# Patient Record
Sex: Male | Born: 1965
Health system: Southern US, Community
[De-identification: ages and names within clinical notes are randomized; demographics above are authoritative.]

## PROBLEM LIST (undated history)

## (undated) ENCOUNTER — Emergency Department (HOSPITAL_COMMUNITY): Disposition: A | Discharge status: Home / Self Care | Payer: Medicaid Other

## (undated) DIAGNOSIS — I509 Heart failure, unspecified: Secondary | ICD-10-CM

## (undated) DIAGNOSIS — Z95810 Presence of automatic (implantable) cardiac defibrillator: Secondary | ICD-10-CM

## (undated) DIAGNOSIS — F329 Major depressive disorder, single episode, unspecified: Secondary | ICD-10-CM

## (undated) DIAGNOSIS — I428 Other cardiomyopathies: Secondary | ICD-10-CM

## (undated) DIAGNOSIS — R000 Tachycardia, unspecified: Secondary | ICD-10-CM

## (undated) DIAGNOSIS — J189 Pneumonia, unspecified organism: Secondary | ICD-10-CM

## (undated) DIAGNOSIS — J449 Chronic obstructive pulmonary disease, unspecified: Secondary | ICD-10-CM

## (undated) DIAGNOSIS — R0600 Dyspnea, unspecified: Secondary | ICD-10-CM

## (undated) DIAGNOSIS — F32A Depression, unspecified: Secondary | ICD-10-CM

## (undated) DIAGNOSIS — K219 Gastro-esophageal reflux disease without esophagitis: Secondary | ICD-10-CM

## (undated) HISTORY — DX: Gastro-esophageal reflux disease without esophagitis: K21.9

## (undated) HISTORY — DX: Tachycardia, unspecified: R00.0

---

## 2011-07-27 DIAGNOSIS — I10 Essential (primary) hypertension: Secondary | ICD-10-CM | POA: Insufficient documentation

## 2011-07-27 DIAGNOSIS — Z720 Tobacco use: Secondary | ICD-10-CM | POA: Insufficient documentation

## 2011-08-27 DIAGNOSIS — Z95810 Presence of automatic (implantable) cardiac defibrillator: Secondary | ICD-10-CM | POA: Insufficient documentation

## 2011-11-22 DIAGNOSIS — I472 Ventricular tachycardia: Secondary | ICD-10-CM | POA: Insufficient documentation

## 2014-02-22 DIAGNOSIS — I5020 Unspecified systolic (congestive) heart failure: Secondary | ICD-10-CM | POA: Insufficient documentation

## 2014-02-22 DIAGNOSIS — I428 Other cardiomyopathies: Secondary | ICD-10-CM

## 2014-02-22 DIAGNOSIS — I472 Ventricular tachycardia, unspecified: Secondary | ICD-10-CM | POA: Insufficient documentation

## 2014-02-22 DIAGNOSIS — I340 Nonrheumatic mitral (valve) insufficiency: Secondary | ICD-10-CM | POA: Insufficient documentation

## 2014-02-22 DIAGNOSIS — R0789 Other chest pain: Secondary | ICD-10-CM | POA: Insufficient documentation

## 2014-02-22 DIAGNOSIS — K219 Gastro-esophageal reflux disease without esophagitis: Secondary | ICD-10-CM | POA: Insufficient documentation

## 2014-02-22 DIAGNOSIS — R0609 Other forms of dyspnea: Secondary | ICD-10-CM | POA: Insufficient documentation

## 2014-02-23 DIAGNOSIS — I2510 Atherosclerotic heart disease of native coronary artery without angina pectoris: Secondary | ICD-10-CM | POA: Insufficient documentation

## 2014-12-17 DIAGNOSIS — F17200 Nicotine dependence, unspecified, uncomplicated: Secondary | ICD-10-CM | POA: Insufficient documentation

## 2015-04-17 HISTORY — PX: ICD GENERATOR REMOVAL: EP1232

## 2017-06-13 ENCOUNTER — Emergency Department (HOSPITAL_COMMUNITY): Payer: Medicaid Other

## 2017-06-13 ENCOUNTER — Inpatient Hospital Stay (HOSPITAL_COMMUNITY)
Admission: EM | Admit: 2017-06-13 | Discharge: 2017-06-17 | DRG: 291 | Disposition: A | Discharge status: Home / Self Care | Payer: Medicaid Other | Attending: Internal Medicine | Admitting: Internal Medicine

## 2017-06-13 ENCOUNTER — Other Ambulatory Visit: Payer: Self-pay

## 2017-06-13 ENCOUNTER — Encounter (HOSPITAL_COMMUNITY): Payer: Self-pay | Admitting: Emergency Medicine

## 2017-06-13 DIAGNOSIS — I5022 Chronic systolic (congestive) heart failure: Secondary | ICD-10-CM

## 2017-06-13 DIAGNOSIS — I447 Left bundle-branch block, unspecified: Secondary | ICD-10-CM | POA: Diagnosis present

## 2017-06-13 DIAGNOSIS — J440 Chronic obstructive pulmonary disease with acute lower respiratory infection: Secondary | ICD-10-CM | POA: Diagnosis present

## 2017-06-13 DIAGNOSIS — R0609 Other forms of dyspnea: Secondary | ICD-10-CM

## 2017-06-13 DIAGNOSIS — I5043 Acute on chronic combined systolic (congestive) and diastolic (congestive) heart failure: Secondary | ICD-10-CM

## 2017-06-13 DIAGNOSIS — I110 Hypertensive heart disease with heart failure: Principal | ICD-10-CM | POA: Diagnosis present

## 2017-06-13 DIAGNOSIS — Z9114 Patient's other noncompliance with medication regimen: Secondary | ICD-10-CM

## 2017-06-13 DIAGNOSIS — F17200 Nicotine dependence, unspecified, uncomplicated: Secondary | ICD-10-CM | POA: Diagnosis present

## 2017-06-13 DIAGNOSIS — I428 Other cardiomyopathies: Secondary | ICD-10-CM | POA: Diagnosis present

## 2017-06-13 DIAGNOSIS — Z79899 Other long term (current) drug therapy: Secondary | ICD-10-CM

## 2017-06-13 DIAGNOSIS — Z7982 Long term (current) use of aspirin: Secondary | ICD-10-CM

## 2017-06-13 DIAGNOSIS — I5023 Acute on chronic systolic (congestive) heart failure: Secondary | ICD-10-CM | POA: Diagnosis present

## 2017-06-13 DIAGNOSIS — J189 Pneumonia, unspecified organism: Secondary | ICD-10-CM | POA: Diagnosis present

## 2017-06-13 DIAGNOSIS — E876 Hypokalemia: Secondary | ICD-10-CM | POA: Diagnosis not present

## 2017-06-13 HISTORY — DX: Other cardiomyopathies: I42.8

## 2017-06-13 HISTORY — DX: Heart failure, unspecified: I50.9

## 2017-06-13 HISTORY — DX: Chronic obstructive pulmonary disease, unspecified: J44.9

## 2017-06-13 LAB — BASIC METABOLIC PANEL
Anion gap: 12 (ref 5–15)
BUN: 12 mg/dL (ref 6–20)
CO2: 21 mmol/L — AB (ref 22–32)
Calcium: 9.1 mg/dL (ref 8.9–10.3)
Chloride: 105 mmol/L (ref 101–111)
Creatinine, Ser: 0.99 mg/dL (ref 0.61–1.24)
GFR calc non Af Amer: >60 mL/min (ref >60)
Glucose, Bld: 98 mg/dL (ref 65–99)
Potassium: 4.2 mmol/L (ref 3.5–5.1)
Sodium: 138 mmol/L (ref 135–145)

## 2017-06-13 LAB — I-STAT TROPONIN, ED: Troponin i, poc: 0.01 ng/mL (ref 0.00–0.08)

## 2017-06-13 LAB — BRAIN NATRIURETIC PEPTIDE: B Natriuretic Peptide: 1699.9 pg/mL — ABNORMAL HIGH (ref 0.0–100.0)

## 2017-06-13 LAB — CBC
HCT: 39.8 % (ref 39.0–52.0)
HEMOGLOBIN: 14.3 g/dL (ref 13.0–17.0)
MCH: 34.4 pg — AB (ref 26.0–34.0)
MCHC: 35.9 g/dL (ref 30.0–36.0)
MCV: 95.7 fL (ref 78.0–100.0)
Platelets: 314 10*3/uL (ref 150–400)
RBC: 4.16 MIL/uL — AB (ref 4.22–5.81)
RDW: 13.7 % (ref 11.5–15.5)
WBC: 9 10*3/uL (ref 4.0–10.5)

## 2017-06-13 MED ORDER — FUROSEMIDE 10 MG/ML IJ SOLN
40.0000 mg | Freq: Once | INTRAMUSCULAR | Status: AC
  Administered 2017-06-13: 40 mg via INTRAVENOUS
  Filled 2017-06-13: qty 4

## 2017-06-13 NOTE — ED Triage Notes (Signed)
Pt c/o increased shortness of breath, abdominal pain and diarrhea. States he has been taking lasix, with no relief. Shortness of breath worse when lying flat.

## 2017-06-14 ENCOUNTER — Encounter (HOSPITAL_COMMUNITY): Payer: Self-pay | Admitting: Family Medicine

## 2017-06-14 ENCOUNTER — Inpatient Hospital Stay (HOSPITAL_COMMUNITY): Payer: Medicaid Other

## 2017-06-14 DIAGNOSIS — I110 Hypertensive heart disease with heart failure: Secondary | ICD-10-CM | POA: Diagnosis present

## 2017-06-14 DIAGNOSIS — I447 Left bundle-branch block, unspecified: Secondary | ICD-10-CM | POA: Diagnosis present

## 2017-06-14 DIAGNOSIS — J189 Pneumonia, unspecified organism: Secondary | ICD-10-CM | POA: Diagnosis present

## 2017-06-14 DIAGNOSIS — F17200 Nicotine dependence, unspecified, uncomplicated: Secondary | ICD-10-CM | POA: Diagnosis present

## 2017-06-14 DIAGNOSIS — I5023 Acute on chronic systolic (congestive) heart failure: Secondary | ICD-10-CM | POA: Diagnosis present

## 2017-06-14 DIAGNOSIS — I509 Heart failure, unspecified: Secondary | ICD-10-CM

## 2017-06-14 DIAGNOSIS — Z79899 Other long term (current) drug therapy: Secondary | ICD-10-CM | POA: Diagnosis not present

## 2017-06-14 DIAGNOSIS — Z7982 Long term (current) use of aspirin: Secondary | ICD-10-CM | POA: Diagnosis not present

## 2017-06-14 DIAGNOSIS — Z9114 Patient's other noncompliance with medication regimen: Secondary | ICD-10-CM | POA: Diagnosis not present

## 2017-06-14 DIAGNOSIS — J440 Chronic obstructive pulmonary disease with acute lower respiratory infection: Secondary | ICD-10-CM | POA: Diagnosis present

## 2017-06-14 DIAGNOSIS — I5043 Acute on chronic combined systolic (congestive) and diastolic (congestive) heart failure: Secondary | ICD-10-CM | POA: Diagnosis present

## 2017-06-14 DIAGNOSIS — I340 Nonrheumatic mitral (valve) insufficiency: Secondary | ICD-10-CM

## 2017-06-14 DIAGNOSIS — E876 Hypokalemia: Secondary | ICD-10-CM | POA: Diagnosis not present

## 2017-06-14 DIAGNOSIS — I428 Other cardiomyopathies: Secondary | ICD-10-CM | POA: Diagnosis present

## 2017-06-14 LAB — BASIC METABOLIC PANEL
ANION GAP: 12 (ref 5–15)
BUN: 14 mg/dL (ref 6–20)
CALCIUM: 9.2 mg/dL (ref 8.9–10.3)
CO2: 24 mmol/L (ref 22–32)
Chloride: 101 mmol/L (ref 101–111)
Creatinine, Ser: 0.93 mg/dL (ref 0.61–1.24)
Glucose, Bld: 131 mg/dL — ABNORMAL HIGH (ref 65–99)
POTASSIUM: 3.5 mmol/L (ref 3.5–5.1)
SODIUM: 137 mmol/L (ref 135–145)

## 2017-06-14 LAB — POTASSIUM: POTASSIUM: 3.7 mmol/L (ref 3.5–5.1)

## 2017-06-14 LAB — HIV ANTIBODY (ROUTINE TESTING W REFLEX): HIV Screen 4th Generation wRfx: NONREACTIVE

## 2017-06-14 LAB — ECHOCARDIOGRAM COMPLETE
Height: 67 in
Weight: 2464 oz

## 2017-06-14 LAB — MAGNESIUM: Magnesium: 2 mg/dL (ref 1.7–2.4)

## 2017-06-14 MED ORDER — ONDANSETRON HCL 4 MG/2ML IJ SOLN
4.0000 mg | Freq: Four times a day (QID) | INTRAMUSCULAR | Status: DC | PRN
  Filled 2017-06-14: qty 2

## 2017-06-14 MED ORDER — ACETAMINOPHEN 325 MG PO TABS
650.0000 mg | ORAL_TABLET | Freq: Once | ORAL | Status: AC
  Administered 2017-06-14: 650 mg via ORAL
  Filled 2017-06-14: qty 2

## 2017-06-14 MED ORDER — ACETAMINOPHEN 325 MG PO TABS
650.0000 mg | ORAL_TABLET | ORAL | Status: DC | PRN
  Administered 2017-06-14: 650 mg via ORAL
  Filled 2017-06-14: qty 2

## 2017-06-14 MED ORDER — SODIUM CHLORIDE 0.9% FLUSH: 3.0000 mL | INTRAVENOUS | Status: DC | PRN

## 2017-06-14 MED ORDER — ENOXAPARIN SODIUM 40 MG/0.4ML ~~LOC~~ SOLN
40.0000 mg | SUBCUTANEOUS | Status: DC
  Administered 2017-06-17: 40 mg via SUBCUTANEOUS
  Filled 2017-06-14 (×4): qty 0.4

## 2017-06-14 MED ORDER — SODIUM CHLORIDE 0.9% FLUSH
3.0000 mL | Freq: Two times a day (BID) | INTRAVENOUS | Status: DC
  Administered 2017-06-14 – 2017-06-17 (×6): 3 mL via INTRAVENOUS

## 2017-06-14 MED ORDER — METOPROLOL TARTRATE 12.5 MG HALF TABLET
12.5000 mg | ORAL_TABLET | Freq: Two times a day (BID) | ORAL | Status: DC
  Administered 2017-06-14 – 2017-06-15 (×3): 12.5 mg via ORAL
  Filled 2017-06-14 (×3): qty 1

## 2017-06-14 MED ORDER — LISINOPRIL 2.5 MG PO TABS
2.5000 mg | ORAL_TABLET | Freq: Every day | ORAL | Status: DC
  Administered 2017-06-14 – 2017-06-17 (×4): 2.5 mg via ORAL
  Filled 2017-06-14 (×4): qty 1

## 2017-06-14 MED ORDER — ZOLPIDEM TARTRATE 5 MG PO TABS
5.0000 mg | ORAL_TABLET | Freq: Every evening | ORAL | Status: DC | PRN
  Administered 2017-06-14 – 2017-06-15 (×2): 5 mg via ORAL
  Filled 2017-06-14 (×2): qty 1

## 2017-06-14 MED ORDER — POTASSIUM CHLORIDE CRYS ER 20 MEQ PO TBCR
20.0000 meq | EXTENDED_RELEASE_TABLET | Freq: Once | ORAL | Status: AC
  Administered 2017-06-14: 20 meq via ORAL
  Filled 2017-06-14: qty 1

## 2017-06-14 MED ORDER — METHOCARBAMOL 500 MG PO TABS
500.0000 mg | ORAL_TABLET | Freq: Once | ORAL | Status: AC
  Administered 2017-06-14: 500 mg via ORAL
  Filled 2017-06-14: qty 1

## 2017-06-14 MED ORDER — FUROSEMIDE 10 MG/ML IJ SOLN
40.0000 mg | Freq: Two times a day (BID) | INTRAMUSCULAR | Status: DC
Start: 2017-06-14 — End: 2017-06-17
  Administered 2017-06-14 – 2017-06-16 (×5): 40 mg via INTRAVENOUS
  Filled 2017-06-14 (×6): qty 4

## 2017-06-14 MED ORDER — SODIUM CHLORIDE 0.9 % IV SOLN: 250.0000 mL | INTRAVENOUS | Status: DC | PRN

## 2017-06-14 NOTE — Progress Notes (Signed)
Patient was admitted this morning with acute on chronic respiratory failure. He has known history of systolic dysfunction was on medication and EF went from 25% to 50%. Patient however has not been taking medications for weeks. He reports running out of insurance and money. He came back with significant acute on chronic CHF exacerbation. Echocardiogram done today shows the EF is back to 25%. We will continue with aggressive diuresis with Ace inhibitors beta blockers. We'll reconsult cardiology prior to discharge.

## 2017-06-14 NOTE — H&P (Addendum)
History and Physical    Carl Ferguson ZOX:[id number] DOB: 1965-05-25 DOA: 06/13/2017  PCP: Patient, No Pcp Per   Patient coming from: Home  Chief Complaint: SOB, orthopnea   HPI: Carl Ferguson is a 52 y.o. male with medical history significant for chronic systolic CHF, now presenting to the emergency department for evaluation of progressive dyspnea and orthopnea.  Patient reports that he ran out of his medications for approximately 6 weeks before recently obtaining refills, but notes that he was given a lower dose of Lasix than he had previously been on.  He complains of progressively worsening shortness of breath, worse with exertion or lying flat.  Denies fevers, chills, or chest pain.  Denies lower extremity edema, but notes that he has never had leg swelling with CHF in the past.  He has a history of low EF with ICD, but reports that the ICD was removed infection, EF had improved to 50%, and so it was not replaced.  ED Course: Upon arrival to the ED, patient is found to be afebrile, saturating adequately on room air, tachypneic, and with vitals otherwise normal.  EKG features a sinus tachycardia with rate 102 and LBBB with no prior available for comparison.  Chemistry panel is unremarkable.  Chest x-ray features mild cardiomegaly, small bilateral pleural effusions, and pulmonary edema.  Patient was given 40 mg IV Lasix in the 80s.  He remains hemodynamically stable, is not in acute distress, and will be admitted to the telemetry unit for ongoing evaluation and management of acute on chronic CHF.  Review of Systems:  All other systems reviewed and apart from HPI, are negative.  Past Medical History:  Diagnosis Date  . CHF (congestive heart failure) (HCC)   . COPD (chronic obstructive pulmonary disease) (HCC)     History reviewed. No pertinent surgical history.   reports that he has been smoking.  he has never used smokeless tobacco. He reports that he does not drink alcohol or use  drugs.  Allergies  Allergen Reactions  . Norflex [Orphenadrine]     Family History  Problem Relation Age of Onset  . Sudden Cardiac Death Neg Hx      Prior to Admission medications   Not on File    Physical Exam: Vitals:   06/13/17 2139 06/13/17 2254 06/13/17 2330  BP: 96/79 103/80 108/87  Pulse: 80 96 99  Resp: 18 18 (!) 29  Temp: 97.9 F (36.6 C)    TempSrc: Oral    SpO2: 95% 95% 94%  Weight: 69.9 kg (154 lb)    Height: 5\' 7"  (1.702 m)        Constitutional: mild tachypnea, calm Eyes: PERTLA, lids and conjunctivae normal ENMT: Mucous membranes are moist. Posterior pharynx clear of any exudate or lesions.   Neck: normal, supple, no masses, no thyromegaly Respiratory: Mild tachypnea and dyspnea with speech. Diffuse rales bilaterally. No accessory muscle use.  Cardiovascular: S1 & S2 heard, regular rate and rhythm. No extremity edema. JVP 9 cmH2O.  Abdomen: No distension, no tenderness, no masses palpated. Bowel sounds normal.  Musculoskeletal: no clubbing / cyanosis. No joint deformity upper and lower extremities.   Skin: no significant rashes, lesions, ulcers. Warm, dry, well-perfused. Neurologic: CN 2-12 grossly intact. Sensation intact. Strength 5/5 in all 4 limbs.  Psychiatric: Alert and oriented x 3. Calm, cooperative.     Labs on Admission: I have personally reviewed following labs and imaging studies  CBC: Recent Labs  Lab 06/13/17 2136  WBC 9.0  HGB  14.3  HCT 39.8  MCV 95.7  PLT 314   Basic Metabolic Panel: Recent Labs  Lab 06/13/17 2136  NA 138  K 4.2  CL 105  CO2 21*  GLUCOSE 98  BUN 12  CREATININE 0.99  CALCIUM 9.1   GFR: Estimated Creatinine Clearance: 83.5 mL/min (by C-G formula based on SCr of 0.99 mg/dL). Liver Function Tests: No results for input(s): AST, ALT, ALKPHOS, BILITOT, PROT, ALBUMIN in the last 168 hours. No results for input(s): LIPASE, AMYLASE in the last 168 hours. No results for input(s): AMMONIA in the last 168  hours. Coagulation Profile: No results for input(s): INR, PROTIME in the last 168 hours. Cardiac Enzymes: No results for input(s): CKTOTAL, CKMB, CKMBINDEX, TROPONINI in the last 168 hours. BNP (last 3 results) No results for input(s): PROBNP in the last 8760 hours. HbA1C: No results for input(s): HGBA1C in the last 72 hours. CBG: No results for input(s): GLUCAP in the last 168 hours. Lipid Profile: No results for input(s): CHOL, HDL, LDLCALC, TRIG, CHOLHDL, LDLDIRECT in the last 72 hours. Thyroid Function Tests: No results for input(s): TSH, T4TOTAL, FREET4, T3FREE, THYROIDAB in the last 72 hours. Anemia Panel: No results for input(s): VITAMINB12, FOLATE, FERRITIN, TIBC, IRON, RETICCTPCT in the last 72 hours. Urine analysis: No results found for: COLORURINE, APPEARANCEUR, LABSPEC, PHURINE, GLUCOSEU, HGBUR, BILIRUBINUR, KETONESUR, PROTEINUR, UROBILINOGEN, NITRITE, LEUKOCYTESUR Sepsis Labs: @LABRCNTIP (procalcitonin:4,lacticidven:4) )No results found for this or any previous visit (from the past 240 hour(s)).   Radiological Exams on Admission: Dg Chest 2 View  Result Date: 06/13/2017 CLINICAL DATA:  52 year old male with shortness of breath. History of COPD and CHF. EXAM: CHEST  2 VIEW COMPARISON:  None. FINDINGS: There is mild diffuse interstitial prominence and Kerley B-lines consistent with interstitial edema. Areas of somewhat nodular density primarily in the right upper lobe may be related to congestion or represent superimposed infection. Clinical correlation is recommended. There are small bilateral pleural effusions. No pneumothorax. Mild cardiomegaly. No acute osseous pathology. IMPRESSION: Mild cardiomegaly with findings of CHF including small bilateral pleural effusions and pulmonary edema. Nodular density in the right upper lobe may be related to vascular congestion or pneumonia. Clinical correlation is recommended. Electronically Signed   By: Elgie Collard M.D.   On:  06/13/2017 22:01    EKG: Independently reviewed. Sinus tachycardia (rate 102), LBBB. No prior available.   Assessment/Plan  1. Acute on chronic CHF  - Presents with progressive SOB and orthopnea, found to be in respiratory distress with BNP 1700 and edema on CXR  - Hx of low EF with ICD, but EF improved to 50% on echo from 2017 performed at [facility]; ICD removed for infection, not replaced d/t improved EF  - Had been out of medications for 6 wks and recently restarted on Lasix, but at lower dose than previously on  - Treated with Lasix 40 mg IV in ED and has begun to diurese  - Continue cardiac monitoring, SLIV and fluid-restrict diet, follow daily wt and I/O's, continue diuresis with Lasix 40 mg IV q12h, continue lisinopril and beta-blocker as tolerated, update echo    2. LBBB  - No old EKG available for review  - No chest pain; troponin normal - Continue cardiac monitoring and check echocardiogram as above   DVT prophylaxis: Lovenox  Code Status: Full  Family Communication: Discussed with patient Disposition Plan: Admit to telemetry Consults called: None Admission status: Inpatient    Briscoe Deutscher, MD [facility] Pager [phone]  If 7PM-7AM, please contact  night-coverage [email or web address] Password [email or web address]  06/14/2017, 1:28 AM

## 2017-06-14 NOTE — ED Provider Notes (Signed)
51 year old male with history of CHF and COPD comes in with progressive dyspnea over the last 10 days.  Dyspnea is worse with laying flat and with any kind of exertion.  He admits that he had run out of his cardiac medications 6 weeks ago.  On exam, he appears dyspneic at rest and has JVD at 90 degrees.  Lungs have faint rales at the bases.  Heart has regular rate and rhythm.  He does have trace pretibial edema.  ECG shows has a left bundle branch block which apparently is new.  According to care everywhere, last ECG on record was February 29, 2016 and had normal QRS duration.  He will need to be given diuretics and started back on his medications.  I have discussed with him the possibility of getting his care transferred to a cardiologist that would be more convenient for him to see.  Medical screening examination/treatment/procedure(s) were conducted as a shared visit with non-physician practitioner(s) and myself.  I personally evaluated the patient during the encounter.   EKG Interpretation  Date/Time:  Thursday June 13 2017 21:33:49 EST Ventricular Rate:  102 PR Interval:  178 QRS Duration: 154 QT Interval:  398 QTC Calculation: 518 R Axis:   133 Text Interpretation:  Sinus tachycardia Possible Left atrial enlargement Left bundle branch block Abnormal ECG No old tracing to compare Confirmed by Dione Booze ([id number]) on 06/13/2017 11:57:25 PM         Dione Booze, MD 06/24/17 2232

## 2017-06-14 NOTE — ED Provider Notes (Signed)
[facility] EMERGENCY DEPARTMENT Provider Note   CSN: [id number] Arrival date & time: 06/13/17  2126     History   Chief Complaint Chief Complaint  Patient presents with  . Shortness of Breath    HPI Carl Ferguson is a 52 y.o. male.  HPI Patient presents to the emergency department with increasing shortness of breath and orthopnea the patient states that he was seen in an emergency department in [place] where he was working and they changed him of his medicines and gave him some IV medications while he was there and discharged him home.  Patient states that he has not gotten much improvement since that time.  Patient states he was out of medicine for about 6 weeks prior to this.  Patient states that he is seeing cardiology in [facility].  He states that he has a primary doctor [place] which is going to help to manage his issues.  The patient states that nothing seems to make the condition better he states he gets short of breath with ambulation and lying flat at night.  Patient states that they decreased his dose of Lasix while he was in the hospital in [place].  The patient denies chest pain,  headache,blurred vision, neck pain, fever, cough, weakness, numbness, dizziness, anorexia, edema, abdominal pain, nausea, vomiting, diarrhea, rash, back pain, dysuria, hematemesis, bloody stool, near syncope, or syncope. Past Medical History:  Diagnosis Date  . CHF (congestive heart failure) (HCC)   . COPD (chronic obstructive pulmonary disease) (HCC)     There are no active problems to display for this patient.   History reviewed. No pertinent surgical history.     Home Medications    Prior to Admission medications   Not on File    Family History No family history on file.  Social History Social History   Tobacco Use  . Smoking status: Current Some Day Smoker  . Smokeless tobacco: Never Used  Substance Use Topics  . Alcohol use: No    Frequency: Never    . Drug use: No     Allergies   Norflex [orphenadrine]   Review of Systems Review of Systems All other systems negative except as documented in the HPI. All pertinent positives and negatives as reviewed in the HPI.  Physical Exam Updated Vital Signs BP 108/87   Pulse 99   Temp 97.9 F (36.6 C) (Oral)   Resp (!) 29   Ht 5\' 7"  (1.702 m)   Wt 69.9 kg (154 lb)   SpO2 94%   BMI 24.12 kg/m   Physical Exam  Constitutional: He is oriented to person, place, and time. He appears well-developed and well-nourished. No distress.  HENT:  Head: Normocephalic and atraumatic.  Mouth/Throat: Oropharynx is clear and moist.  Eyes: Pupils are equal, round, and reactive to light.  Neck: Normal range of motion. Neck supple.  Cardiovascular: Normal rate, regular rhythm and normal heart sounds. Exam reveals no gallop and no friction rub.  No murmur heard. Pulmonary/Chest: Effort normal and breath sounds normal. No respiratory distress. He has no wheezes.  Abdominal: Soft. Bowel sounds are normal. He exhibits no distension. There is no tenderness.  Neurological: He is alert and oriented to person, place, and time. He exhibits normal muscle tone. Coordination normal.  Skin: Skin is warm and dry. Capillary refill takes less than 2 seconds. No rash noted. No erythema.  Psychiatric: He has a normal mood and affect. His behavior is normal.  Nursing note and vitals  reviewed.    ED Treatments / Results  Labs (all labs ordered are listed, but only abnormal results are displayed) Labs Reviewed  BASIC METABOLIC PANEL - Abnormal; Notable for the following components:      Result Value   CO2 21 (*)    All other components within normal limits  CBC - Abnormal; Notable for the following components:   RBC 4.16 (*)    MCH 34.4 (*)    All other components within normal limits  BRAIN NATRIURETIC PEPTIDE - Abnormal; Notable for the following components:   B Natriuretic Peptide 1,699.9 (*)    All other  components within normal limits  I-STAT TROPONIN, ED    EKG  EKG Interpretation  Date/Time:  Thursday June 13 2017 21:33:49 EST Ventricular Rate:  102 PR Interval:  178 QRS Duration: 154 QT Interval:  398 QTC Calculation: 518 R Axis:   133 Text Interpretation:  Sinus tachycardia Possible Left atrial enlargement Left bundle branch block Abnormal ECG No old tracing to compare Confirmed by Dione Booze ([id number]) on 06/13/2017 11:57:25 PM       Radiology Dg Chest 2 View  Result Date: 06/13/2017 CLINICAL DATA:  52 year old male with shortness of breath. History of COPD and CHF. EXAM: CHEST  2 VIEW COMPARISON:  None. FINDINGS: There is mild diffuse interstitial prominence and Kerley B-lines consistent with interstitial edema. Areas of somewhat nodular density primarily in the right upper lobe may be related to congestion or represent superimposed infection. Clinical correlation is recommended. There are small bilateral pleural effusions. No pneumothorax. Mild cardiomegaly. No acute osseous pathology. IMPRESSION: Mild cardiomegaly with findings of CHF including small bilateral pleural effusions and pulmonary edema. Nodular density in the right upper lobe may be related to vascular congestion or pneumonia. Clinical correlation is recommended. Electronically Signed   By: Elgie Collard M.D.   On: 06/13/2017 22:01    Procedures Procedures (including critical care time)  Medications Ordered in ED Medications  furosemide (LASIX) injection 40 mg (40 mg Intravenous Given 06/13/17 2358)     Initial Impression / Assessment and Plan / ED Course  I have reviewed the triage vital signs and the nursing notes.  Pertinent labs & imaging results that were available during my care of the patient were reviewed by me and considered in my medical decision making (see chart for details).     I feel the patient need to be admitted to further control his CHF exacerbation.  I have given him IV Lasix I  spoke with the [facility] who will admit the patient.  Patient is otherwise stable at this time.  I have advised the patient of the plan and all questions were answered.  Final Clinical Impressions(s) / ED Diagnoses   Final diagnoses:  None    ED Discharge Orders    None       Charlestine Night, PA-C 06/14/17 0046    Tilden Fossa, MD 06/14/17 1459

## 2017-06-14 NOTE — Progress Notes (Signed)
Echocardiogram 2D Echocardiogram has been performed.  06/14/2017 3:07 PM Gertie Fey, BS, RVT, RDCS, RDMS

## 2017-06-14 NOTE — Progress Notes (Signed)
Pt c/o leg cramps.  Dr. Mikeal Hawthorne made aware. Received order to recheck BMP and Mg. Pt was informed. Hinton Dyer, RN

## 2017-06-15 ENCOUNTER — Encounter (HOSPITAL_COMMUNITY): Payer: Self-pay | Admitting: Student

## 2017-06-15 ENCOUNTER — Other Ambulatory Visit: Payer: Self-pay

## 2017-06-15 DIAGNOSIS — J153 Pneumonia due to streptococcus, group B: Secondary | ICD-10-CM

## 2017-06-15 DIAGNOSIS — J189 Pneumonia, unspecified organism: Secondary | ICD-10-CM | POA: Diagnosis present

## 2017-06-15 LAB — BASIC METABOLIC PANEL
ANION GAP: 13 (ref 5–15)
BUN: 12 mg/dL (ref 6–20)
CALCIUM: 9.2 mg/dL (ref 8.9–10.3)
CO2: 23 mmol/L (ref 22–32)
Chloride: 103 mmol/L (ref 101–111)
Creatinine, Ser: 0.89 mg/dL (ref 0.61–1.24)
Glucose, Bld: 83 mg/dL (ref 65–99)
POTASSIUM: 3.7 mmol/L (ref 3.5–5.1)
Sodium: 139 mmol/L (ref 135–145)

## 2017-06-15 MED ORDER — GUAIFENESIN-DM 100-10 MG/5ML PO SYRP
5.0000 mL | ORAL_SOLUTION | ORAL | Status: DC | PRN
  Administered 2017-06-15 (×2): 5 mL via ORAL
  Filled 2017-06-15 (×2): qty 5

## 2017-06-15 MED ORDER — BENZONATATE 100 MG PO CAPS
200.0000 mg | ORAL_CAPSULE | Freq: Two times a day (BID) | ORAL | Status: DC | PRN
  Administered 2017-06-15: 200 mg via ORAL
  Filled 2017-06-15: qty 2

## 2017-06-15 MED ORDER — METOPROLOL SUCCINATE ER 25 MG PO TB24
25.0000 mg | ORAL_TABLET | Freq: Every day | ORAL | Status: DC
  Administered 2017-06-16 – 2017-06-17 (×2): 25 mg via ORAL
  Filled 2017-06-15 (×3): qty 1

## 2017-06-15 MED ORDER — LEVOFLOXACIN IN D5W 750 MG/150ML IV SOLN
750.0000 mg | INTRAVENOUS | Status: DC
  Administered 2017-06-15: 750 mg via INTRAVENOUS
  Filled 2017-06-15 (×2): qty 150

## 2017-06-15 NOTE — Plan of Care (Signed)
Discussed medication changes. HR continues to be elevated 130's - 140's at times when up to the BR. Asymptomatic

## 2017-06-15 NOTE — Progress Notes (Signed)
Patient ID: Carl Ferguson, male   DOB: Aug 30, 1965, 52 y.o.   MRN: [id number]  PROGRESS NOTE    Carl Ferguson  RTM:[id number] DOB: Nov 05, 1965 DOA: 06/13/2017 PCP: Patient, No Pcp Per   Outpatient Specialists: None  Brief Narrative: Patient is a 52 year old gentleman with known history of systolic dysfunction CHF who previously had an EF of 25% in 2017 and and subsequently had ICD placed which was later removed due to infection. Patient was on medical therapy and EF improved to 50%. He has been out of medications for about 6 weeks due to insurance issues. Patient presented with shortness of breath orthopnea and PND. He also has gained weight. He was found to have acute exacerbation of congestive heart failure. Repeat echocardiogram here showed EF has fallen back to 25%. He also had evidence of pneumonia on his chest x-ray  Assessment & Plan:   Principal Problem:   CHF, acute on chronic (HCC)   #1 acute exacerbation of systolic dysfunction CHF: Patient has [place] score of 3. He is improving with diuresis. With his low EF cardiology consulted. Continue beta blocker and ACE inhibitor as as well as diuretics. Follow cardiology recommendations.  #2 Community acquired pneumonia: Start patient on Levaquin am monitor closely.  #3 hypertension: Monitor blood pressure and continue close management.  #4 medication noncompliance: Patient said he was out of patient was performed. He hass getting some insurance now and will continue to have his medications. Counseling provided   DVT prophylaxis: Lovenox Code Status: full Family Communication: none available Disposition Plan: Home  Consultants:   Dr Donnie Aho, Cardiology  Procedures: None Antimicrobials: Levaquin  Subjective: Patient is feeling much better. No significant shortness of breath like he had.  Objective: Vitals:   06/14/17 0949 06/14/17 1349 06/14/17 2021 06/15/17 0300  BP:  106/86 103/68 105/84  Pulse: (!) 107 (!) 105   100  Resp:   14 16  Temp:  (!) 97.3 F (36.3 C) 97.9 F (36.6 C) (!) 97.5 F (36.4 C)  TempSrc:  Oral Oral Oral  SpO2:  98% 100% 98%  Weight:    65.6 kg (144 lb 9.6 oz)  Height:        Intake/Output Summary (Last 24 hours) at 06/15/2017 0855 Last data filed at 06/14/2017 1806 Gross per 24 hour  Intake 1080 ml  Output 1090 ml  Net -10 ml   Filed Weights   06/13/17 2139 06/15/17 0300  Weight: 69.9 kg (154 lb) 65.6 kg (144 lb 9.6 oz)    Examination:  General exam: Appears calm and comfortable  Respiratory system: Clear to auscultation. Respiratory effort normal. Cardiovascular system: S1 & S2 heard, RRR. No JVD, murmurs, rubs, gallops or clicks. No pedal edema. Gastrointestinal system: Abdomen is nondistended, soft and nontender. No organomegaly or masses felt. Normal bowel sounds heard. Central nervous system: Alert and oriented. No focal neurological deficits. Extremities: Symmetric 5 x 5 power. Skin: No rashes, lesions or ulcers Psychiatry: Judgement and insight appear normal. Mood & affect appropriate.     Data Reviewed: I have personally reviewed following labs and imaging studies  CBC: Recent Labs  Lab 06/13/17 2136  WBC 9.0  HGB 14.3  HCT 39.8  MCV 95.7  PLT 314   Basic Metabolic Panel: Recent Labs  Lab 06/13/17 2136 06/14/17 1138 06/14/17 2055 06/15/17 0251  NA 138 137  --  139  K 4.2 3.5 3.7 3.7  CL 105 101  --  103  CO2 21* 24  --  23  GLUCOSE 98 131*  --  83  BUN 12 14  --  12  CREATININE 0.99 0.93  --  0.89  CALCIUM 9.1 9.2  --  9.2  MG  --  2.0  --   --    GFR: Estimated Creatinine Clearance: 92.1 mL/min (by C-G formula based on SCr of 0.89 mg/dL). Liver Function Tests: No results for input(s): AST, ALT, ALKPHOS, BILITOT, PROT, ALBUMIN in the last 168 hours. No results for input(s): LIPASE, AMYLASE in the last 168 hours. No results for input(s): AMMONIA in the last 168 hours. Coagulation Profile: No results for input(s): INR, PROTIME in  the last 168 hours. Cardiac Enzymes: No results for input(s): CKTOTAL, CKMB, CKMBINDEX, TROPONINI in the last 168 hours. BNP (last 3 results) No results for input(s): PROBNP in the last 8760 hours. HbA1C: No results for input(s): HGBA1C in the last 72 hours. CBG: No results for input(s): GLUCAP in the last 168 hours. Lipid Profile: No results for input(s): CHOL, HDL, LDLCALC, TRIG, CHOLHDL, LDLDIRECT in the last 72 hours. Thyroid Function Tests: No results for input(s): TSH, T4TOTAL, FREET4, T3FREE, THYROIDAB in the last 72 hours. Anemia Panel: No results for input(s): VITAMINB12, FOLATE, FERRITIN, TIBC, IRON, RETICCTPCT in the last 72 hours. Urine analysis: No results found for: COLORURINE, APPEARANCEUR, LABSPEC, PHURINE, GLUCOSEU, HGBUR, BILIRUBINUR, KETONESUR, PROTEINUR, UROBILINOGEN, NITRITE, LEUKOCYTESUR Sepsis Labs: @LABRCNTIP (procalcitonin:4,lacticidven:4)  )No results found for this or any previous visit (from the past 240 hour(s)).       Radiology Studies: Dg Chest 2 View  Result Date: 06/13/2017 CLINICAL DATA:  52 year old male with shortness of breath. History of COPD and CHF. EXAM: CHEST  2 VIEW COMPARISON:  None. FINDINGS: There is mild diffuse interstitial prominence and Kerley B-lines consistent with interstitial edema. Areas of somewhat nodular density primarily in the right upper lobe may be related to congestion or represent superimposed infection. Clinical correlation is recommended. There are small bilateral pleural effusions. No pneumothorax. Mild cardiomegaly. No acute osseous pathology. IMPRESSION: Mild cardiomegaly with findings of CHF including small bilateral pleural effusions and pulmonary edema. Nodular density in the right upper lobe may be related to vascular congestion or pneumonia. Clinical correlation is recommended. Electronically Signed   By: Elgie Collard M.D.   On: 06/13/2017 22:01        Scheduled Meds: . enoxaparin (LOVENOX) injection  40  mg Subcutaneous Q24H  . furosemide  40 mg Intravenous BID  . lisinopril  2.5 mg Oral Daily  . metoprolol tartrate  12.5 mg Oral BID  . sodium chloride flush  3 mL Intravenous Q12H   Continuous Infusions: . sodium chloride       LOS: 1 day    Time spent: 25  minutes    Jackey Housey,LAWAL, MD [facility] Pager [phone] If 7PM-7AM, please contact night-coverage [email or web address] Password [facility] 06/15/2017, 8:55 AM

## 2017-06-15 NOTE — Plan of Care (Signed)
Pt oriented to unit, plan of care, and method of reporting concerns. Verbalizes understanding of need to call for assistance prior to ambulation when necessary. Call light and bedside table within reach 

## 2017-06-15 NOTE — Consult Note (Addendum)
Cardiology Consult    Patient ID: Carl Ferguson Ferguson; [id number]; [birth year]   Admit date: 06/13/2017 Date of Consult: 06/15/2017  Primary Care Provider: Patient, No Pcp Per Primary Cardiologist: [facility]   Patient Profile    Carl Ferguson Ferguson is a 52 y.o. male with past medical history of nonischemic cardiomyopathy (diagnosed in 2011 with EF 30% at that time, improved to 50% by echo in 02/2016), chronic combined systolic and diastolic CHF, prior ICD placement (extraction in 11/2014 due to infection), and tobacco use who is being seen today for the evaluation of CHF at the request of Dr. Mikeal Hawthorne.   History of Present Illness    Carl Ferguson Ferguson was last evaluated by [facility] in 02/2016 and reported doing well from a cardiac perspective at that time, taking his medications as prescribed. He was on ASA 81mg  daily, Lasix 40mg  daily, Lisinopril 10mg  daily, Toprol-XL 100mg  daily, and Omeprazole 20mg  daily at that time.   He presented to [facility] ED on 06/13/2017 for worsening dyspnea on exertion and orthopnea in the setting of being without his medications for over 6 weeks. Reports he travels for work and had missed several follow-up appointments with his Primary Cardiologist, therefore he was unable to obtain refills. Over the past 2-3 weeks, he has noticed worsening dyspnea on exertion and orthopnea. This prompted him to go to an ED in [place] last week but he says the medications they administered made his breathing worse and he therefore left. He denies any recent chest pain, palpitations, lower extremity edema, dizziness, or presyncope.  Says he has undergone ICD placement x3 since his initial diagnosis in 2011 but has required removal due to recurrent infections. Reports having a catheterization in 2011 at the time of diagnosis which showed no significant CAD by his report.    He continues to smoke 0.5 ppd. Consumes 3-4 beers daily. Reports prior Cocaine use, last used 2 ago.  Initial labs  show WBC 9.0, Hgb 14.3, platelets 314, Na+ 138, K+ 4.2, and creatinine 0.99. BNP 1699. Initial troponin negative. CXR showed mild cardiomegaly with findings of CHF including small bilateral pleural effusions and pulmonary edema. EKG shows sinus tachycardia, HR 102, with LBBB (LBBB was not mentioned in his EKG report from 02/2016 but the actual image is not available for review). A repeat echocardiogram has been obtained and shows a reduced EF of 15% to 20%, severe diffuse hypokinesis, moderate to severe MR, and mild TR.   He has been started on IV Lasix 40mg  BID with a recorded net output of -935 mL. Weight is listed as 154 lbs on admission, down to 144 lbs today. The patient is unsure of his baseline weight.    Past Medical History:  Diagnosis Date  . CHF (congestive heart failure) (HCC)   . COPD (chronic obstructive pulmonary disease) (HCC)   . Nonischemic cardiomyopathy (HCC)    a. diagnosed in 2011 with EF 30% at that time b. EF improved to 50% by echo in 02/2016    History reviewed. No pertinent surgical history.   Home Medications:  Prior to Admission medications   Medication Sig Start Date End Date Taking? Authorizing Provider  aspirin EC 81 MG tablet Take 81 mg by mouth daily.   Yes [provider]  furosemide (LASIX) 40 MG tablet Take 40 mg by mouth daily.   Yes [provider]  lisinopril (PRINIVIL,ZESTRIL) 2.5 MG tablet Take 2.5 mg by mouth daily.   Yes [provider]  metoprolol  succinate (TOPROL-XL) 100 MG 24 hr tablet Take 100 mg by mouth daily. Take with or immediately following a meal.   Yes [provider]  omeprazole (PRILOSEC) 20 MG capsule Take 20 mg by mouth daily.   Yes [provider]  potassium chloride SA (K-DUR,KLOR-CON) 20 MEQ tablet Take 20 mEq by mouth daily.   Yes [provider]    Inpatient Medications: Scheduled Meds: . enoxaparin (LOVENOX) injection  40 mg Subcutaneous Q24H  . furosemide  40 mg  Intravenous BID  . lisinopril  2.5 mg Oral Daily  . metoprolol succinate  25 mg Oral Daily  . sodium chloride flush  3 mL Intravenous Q12H   Continuous Infusions: . sodium chloride    . levofloxacin (LEVAQUIN) IV     PRN Meds: sodium chloride, acetaminophen, ondansetron (ZOFRAN) IV, sodium chloride flush, zolpidem  Allergies:    Allergies  Allergen Reactions  . Norflex [Orphenadrine] Swelling    Social History:   Social History   Socioeconomic History  . Marital status: Single    Spouse name: Not on file  . Number of children: Not on file  . Years of education: Not on file  . Highest education level: Not on file  Social Needs  . Financial resource strain: Not on file  . Food insecurity - worry: Not on file  . Food insecurity - inability: Not on file  . Transportation needs - medical: Not on file  . Transportation needs - non-medical: Not on file  Occupational History  . Not on file  Tobacco Use  . Smoking status: Current Some Day Smoker  . Smokeless tobacco: Never Used  Substance and Sexual Activity  . Alcohol use: Yes    Frequency: Never    Comment: 3-4 beers daily  . Drug use: No  . Sexual activity: Not on file  Other Topics Concern  . Not on file  Social History Narrative  . Not on file     Family History:    Family History  Problem Relation Age of Onset  . Heart attack Mother   . Sudden Cardiac Death Neg Hx       Review of Systems    General:  No chills, fever, night sweats says he has lost 20 pounds of weight recently  Cardiovascular:  No chest pain, edema,  palpitations, paroxysmal nocturnal dyspnea. Positive for dyspnea on exertion and orthopnea.  Dermatological: No rash, lesions/masses Respiratory: No cough, dyspnea Urologic: No hematuria, dysuria Abdominal:   No nausea, vomiting, diarrhea, bright red blood per rectum, melena, or hematemesis Neurologic:  No visual changes, wkns, changes in mental status. All other systems reviewed and are  otherwise negative except as noted above.  Physical Exam/Data    Vitals:   06/14/17 0949 06/14/17 1349 06/14/17 2021 06/15/17 0300  BP:  106/86 103/68 105/84  Pulse: (!) 107 (!) 105  100  Resp:   14 16  Temp:  (!) 97.3 F (36.3 C) 97.9 F (36.6 C) (!) 97.5 F (36.4 C)  TempSrc:  Oral Oral Oral  SpO2:  98% 100% 98%  Weight:    144 lb 9.6 oz (65.6 kg)  Height:        Intake/Output Summary (Last 24 hours) at 06/15/2017 1248 Last data filed at 06/14/2017 1806 Gross per 24 hour  Intake 720 ml  Output 250 ml  Net 470 ml   Filed Weights   06/13/17 2139 06/15/17 0300  Weight: 154 lb (69.9 kg) 144 lb 9.6 oz (65.6 kg)  Body mass index is 22.65 kg/m.   General: Pleasant muscular Bm in NAD Skin: no lesions Psych: Normal affect. Neuro: Alert and oriented X 3. Moves all extremities spontaneously. HEENT: Normal  Neck: Supple without bruits or JVD. Lungs:  Resp regular and unlabored, mild rales along bases bilaterally. Heart: RRR no  Murmurs.normal S1 and S2 s3 gallop heard Abdomen: Soft, non-tender, non-distended, BS + x 4.  Extremities: No clubbing, cyanosis or edema. DP/PT/Radials 2+ and equal bilaterally.   EKG:  The EKG was personally reviewed and demonstrates: Sinus tachycardia, HR 102, with LBBB    Labs/Studies     Relevant CV Studies:  Echocardiogram: 02/2016 Limited study to evaluate LV ejection fraction  EF estimated 50%  Normal right ventricular systolic function  Degenerative mitral valve disease  Echocardiogram: 06/14/2017 Study Conclusions  - Left ventricle: The cavity size was severely dilated. Systolic   function was normal. The estimated ejection fraction was in the   range of 15% to 20%. Severe, diffuse hypokinesis. - Aortic valve: Transvalvular velocity was within the normal range.   There was no stenosis. There was no regurgitation. - Mitral valve: Transvalvular velocity was within the normal range.   There was no evidence for stenosis. There was  moderate to severe   regurgitation. - Left atrium: The atrium was severely dilated. - Right ventricle: The cavity size was normal. Wall thickness was   normal. Systolic function was normal. - Atrial septum: No defect or patent foramen ovale was identified   by color flow Doppler. - Tricuspid valve: There was mild regurgitation. - Pulmonary arteries: Systolic pressure was moderately increased.   PA peak pressure: 45 mm Hg (S).  Laboratory Data:  Chemistry Recent Labs  Lab 06/13/17 2136 06/14/17 1138 06/14/17 2055 06/15/17 0251  NA 138 137  --  139  K 4.2 3.5 3.7 3.7  CL 105 101  --  103  CO2 21* 24  --  23  GLUCOSE 98 131*  --  83  BUN 12 14  --  12  CREATININE 0.99 0.93  --  0.89  CALCIUM 9.1 9.2  --  9.2  GFRNONAA >60 >60  --  >60  GFRAA >60 >60  --  >60  ANIONGAP 12 12  --  13    Hematology Recent Labs  Lab 06/13/17 2136  WBC 9.0  RBC 4.16*  HGB 14.3  HCT 39.8  MCV 95.7  MCH 34.4*  MCHC 35.9  RDW 13.7  PLT 314   Cardiac EnzymesNo results for input(s): TROPONINI in the last 168 hours.  Recent Labs  Lab 06/13/17 2156  TROPIPOC 0.01    BNP Recent Labs  Lab 06/13/17 2143  BNP 1,699.9*    Radiology/Studies:  Dg Chest 2 View  Result Date: 06/13/2017 CLINICAL DATA:  52 year old male with shortness of breath. History of COPD and CHF. EXAM: CHEST  2 VIEW COMPARISON:  None. FINDINGS: There is mild diffuse interstitial prominence and Kerley B-lines consistent with interstitial edema. Areas of somewhat nodular density primarily in the right upper lobe may be related to congestion or represent superimposed infection. Clinical correlation is recommended. There are small bilateral pleural effusions. No pneumothorax. Mild cardiomegaly. No acute osseous pathology. IMPRESSION: Mild cardiomegaly with findings of CHF including small bilateral pleural effusions and pulmonary edema. Nodular density in the right upper lobe may be related to vascular congestion or pneumonia.  Clinical correlation is recommended. Electronically Signed   By: Elgie Collard M.D.   On: 06/13/2017 22:01  Assessment & Plan    1. Acute on Chronic Combined Systolic and Diastolic CHF - the patient has a history of nonischemic cardiomyopathy with EF improving to 50% by echo in 02/2016. He presented with worsening dyspnea on exertion and orthopnea in the setting of being without his medications for over 6 weeks. Denies any chest pain or palpitations.  - BNP elevated to 1699 on admission and CXR consistent with CHF.  - He has been receiving IV Lasix 40mg  BID with a recorded net output of -935 mL however weight has declined from 154 lbs on admission to 144 lbs today. He still has rales on examination today. Continue IV Lasix and consider transition to PO tomorrow.  - will switch from short-acting Lopressor to Toprol-XL in the setting of his cardiomyopathy. Will start at 25mg  daily and titrate as HR and BP allow. Has been started on ACE-I therapy. Pending benefits check, consider transition to [facility] as an outpatient but would require 36-hour washout period.   2. Nonischemic Cardiomyopathy - diagnosed in 2011 with EF 30% at that time, improved to 50% by echo in 02/2016 but now reduced back to 15-20% as outlined above. Has a history of multiple ICD placements with subsequent extractions secondary to recurrent infections. Last extraction was in 11/2014.  - restart Toprol-XL and ACE-I or ARB as outlined above with potential transition to [facility] pending affordability. Will need to continue with medical therapy for 3 months then reassess EF at that time.   3. LBBB - unknown new versus old as no prior tracings are not available for review. LBBB was not mentioned in his EKG report from 02/2016 but the actual image is not available for review. Need to consider repeat ischemic evaluation as an outpatient. He would benefit from cardiac resynchronization if EF fails to imporve on medical therapy but has  a difficult past history   4. Substance Use - continues to smoke 0.5 ppd. Also consumes 3-6 beers per day and last used Cocaine 2 months ago by his report. Cessation advised. Reviewed the risk of BB therapy and concurrent cocaine use with the patient.    For questions or updates, please contact [facility] Please consult [email or web address] for contact info under Cardiology/STEMI.  Signed, Ellsworth Lennox, PA-C 06/15/2017, 12:48 PM Pager: [phone]  Patient seen and examined.  Above note reviewed and updated.  He has a prior nonischemic cardiomyopathy and had stopped taking his medicines recently.  He has also been using significant amounts of alcohol and cocaine use as recent as this year.  He reports a positive drug screen earlier this year.  He was recently started back on his medicines but had worsening of his heart failure after taking all of the medicines once.  He has a new left bundle branch block and will need to get back on his medicines have his ejection fraction reassessed in 3 months.  He has a complicated history of previous AICD and ideally if he does not improve his ejection fraction consider resynchronization but this will be difficult because of the prior history.  See recommendations as noted above.  Does not currently appear volume overloaded.   Darden Palmer MD [facility] 3:31 PM

## 2017-06-16 ENCOUNTER — Inpatient Hospital Stay (HOSPITAL_COMMUNITY): Payer: Medicaid Other

## 2017-06-16 DIAGNOSIS — I5022 Chronic systolic (congestive) heart failure: Secondary | ICD-10-CM

## 2017-06-16 LAB — BASIC METABOLIC PANEL
ANION GAP: 14 (ref 5–15)
BUN: 13 mg/dL (ref 6–20)
CHLORIDE: 100 mmol/L — AB (ref 101–111)
CO2: 23 mmol/L (ref 22–32)
Calcium: 9.3 mg/dL (ref 8.9–10.3)
Creatinine, Ser: 0.98 mg/dL (ref 0.61–1.24)
GFR calc Af Amer: >60 mL/min (ref >60)
GLUCOSE: 89 mg/dL (ref 65–99)
POTASSIUM: 4.1 mmol/L (ref 3.5–5.1)
Sodium: 137 mmol/L (ref 135–145)

## 2017-06-16 MED ORDER — LEVOFLOXACIN 750 MG PO TABS
750.0000 mg | ORAL_TABLET | Freq: Every day | ORAL | Status: DC
  Administered 2017-06-16 – 2017-06-17 (×2): 750 mg via ORAL
  Filled 2017-06-16 (×2): qty 1

## 2017-06-16 MED ORDER — SPIRONOLACTONE 12.5 MG HALF TABLET
12.5000 mg | ORAL_TABLET | Freq: Every day | ORAL | Status: DC
Start: 2017-06-16 — End: 2017-06-17
  Administered 2017-06-16 – 2017-06-17 (×2): 12.5 mg via ORAL
  Filled 2017-06-16 (×2): qty 1

## 2017-06-16 NOTE — Progress Notes (Signed)
Patient ID: Carl Ferguson, male   DOB: [birth year], 52 y.o.   MRN: [id number]  PROGRESS NOTE    Chesky Heyer  JYN:[id number] DOB: 09/15/1965 DOA: 06/13/2017 PCP: Patient, No Pcp Per   Outpatient Specialists: None  Brief Narrative: Patient is a 52 year old gentleman with known history of systolic dysfunction CHF who previously had an EF of 25% in 2017 and and subsequently had ICD placed which was later removed due to infection. Patient was on medical therapy and EF improved to 50%. He has been out of medications for about 6 weeks due to insurance issues. Patient presented with shortness of breath orthopnea and PND. He also has gained weight. He was found to have acute exacerbation of congestive heart failure. Repeat echocardiogram here showed EF has fallen back to 25%. He also had evidence of pneumonia on his chest x-ray. He is being seen by cardiology.  Assessment & Plan:   Principal Problem:   Acute on chronic systolic congestive heart failure (HCC) Active Problems:   Pneumonia   Nonischemic cardiomyopathy (HCC)   #1 acute exacerbation of systolic dysfunction CHF: Patient doing much better today. No significant shortness of breath or cough. Aldactone was added today and Lasix changed to oral route Follow cardiology recommendations.  #2 Community acquired pneumonia: Start patient on Levaquin am monitor closely.  #3 hypertension: Monitor blood pressure and continue close management.  #4 medication noncompliance: Patient said he was out of patient was performed. He has insurance now and will continue to have his medications. Counseling provided   DVT prophylaxis: Lovenox Code Status: full Family Communication: none available Disposition Plan: Home  Consultants:   Dr Donnie Aho, Cardiology  Procedures: None Antimicrobials: Levaquin  Subjective: Patient is feeling much better. No significant shortness of breath like he had.  Objective: Vitals:   06/15/17 0300 06/15/17 1429  06/15/17 2028 06/16/17 0433  BP: 105/84 (!) 87/67 96/77 93/69   Pulse: 100 (!) 102 (!) 106 97  Resp: 16  16 14   Temp: (!) 97.5 F (36.4 C) (!) 97.4 F (36.3 C) 98.7 F (37.1 C) 98.2 F (36.8 C)  TempSrc: Oral Oral Oral Oral  SpO2: 98% 99% 100% 94%  Weight: 65.6 kg (144 lb 9.6 oz)   63.8 kg (140 lb 11.2 oz)  Height:        Intake/Output Summary (Last 24 hours) at 06/16/2017 0838 Last data filed at 06/16/2017 0300 Gross per 24 hour  Intake 240 ml  Output -  Net 240 ml   Filed Weights   06/13/17 2139 06/15/17 0300 06/16/17 0433  Weight: 69.9 kg (154 lb) 65.6 kg (144 lb 9.6 oz) 63.8 kg (140 lb 11.2 oz)    Examination:  General exam: Appears calm and comfortable  Respiratory system: Clear to auscultation. Respiratory effort normal. Cardiovascular system: S1 & S2 heard, RRR. No JVD, murmurs, rubs, gallops or clicks. No pedal edema. Gastrointestinal system: Abdomen is nondistended, soft and nontender. No organomegaly or masses felt. Normal bowel sounds heard. Central nervous system: Alert and oriented. No focal neurological deficits. Extremities: Symmetric 5 x 5 power. Skin: No rashes, lesions or ulcers Psychiatry: Judgement and insight appear normal. Mood & affect appropriate.     Data Reviewed: I have personally reviewed following labs and imaging studies  CBC: Recent Labs  Lab 06/13/17 2136  WBC 9.0  HGB 14.3  HCT 39.8  MCV 95.7  PLT 314   Basic Metabolic Panel: Recent Labs  Lab 06/13/17 2136 06/14/17 1138 06/14/17 2055 06/15/17 0251  NA 138 137  --  139  K 4.2 3.5 3.7 3.7  CL 105 101  --  103  CO2 21* 24  --  23  GLUCOSE 98 131*  --  83  BUN 12 14  --  12  CREATININE 0.99 0.93  --  0.89  CALCIUM 9.1 9.2  --  9.2  MG  --  2.0  --   --    GFR: Estimated Creatinine Clearance: 89.6 mL/min (by C-G formula based on SCr of 0.89 mg/dL). Liver Function Tests: No results for input(s): AST, ALT, ALKPHOS, BILITOT, PROT, ALBUMIN in the last 168 hours. No results  for input(s): LIPASE, AMYLASE in the last 168 hours. No results for input(s): AMMONIA in the last 168 hours. Coagulation Profile: No results for input(s): INR, PROTIME in the last 168 hours. Cardiac Enzymes: No results for input(s): CKTOTAL, CKMB, CKMBINDEX, TROPONINI in the last 168 hours. BNP (last 3 results) No results for input(s): PROBNP in the last 8760 hours. HbA1C: No results for input(s): HGBA1C in the last 72 hours. CBG: No results for input(s): GLUCAP in the last 168 hours. Lipid Profile: No results for input(s): CHOL, HDL, LDLCALC, TRIG, CHOLHDL, LDLDIRECT in the last 72 hours. Thyroid Function Tests: No results for input(s): TSH, T4TOTAL, FREET4, T3FREE, THYROIDAB in the last 72 hours. Anemia Panel: No results for input(s): VITAMINB12, FOLATE, FERRITIN, TIBC, IRON, RETICCTPCT in the last 72 hours. Urine analysis: No results found for: COLORURINE, APPEARANCEUR, LABSPEC, PHURINE, GLUCOSEU, HGBUR, BILIRUBINUR, KETONESUR, PROTEINUR, UROBILINOGEN, NITRITE, LEUKOCYTESUR Sepsis Labs: @LABRCNTIP (procalcitonin:4,lacticidven:4)  )No results found for this or any previous visit (from the past 240 hour(s)).       Radiology Studies: Dg Chest Port 1 View  Result Date: 06/16/2017 CLINICAL DATA:  Dyspnea on exertion EXAM: PORTABLE CHEST 1 VIEW COMPARISON:  06/13/2017 FINDINGS: Residual mild right infrahilar/right lower lobe opacity. This appearance favors mild pneumonia over asymmetric pulmonary edema. Left lung is clear. When compared to the prior, the right upper lobe and perihilar opacities have improved/resolved. No pleural effusion or pneumothorax. The heart is normal in size. IMPRESSION: Residual mild right infrahilar/right lower lobe opacity, worrisome for mild pneumonia, less likely asymmetric pulmonary edema. Overall appearance has improved from the prior. Electronically Signed   By: Charline Bills M.D.   On: 06/16/2017 07:57        Scheduled Meds: . enoxaparin  (LOVENOX) injection  40 mg Subcutaneous Q24H  . furosemide  40 mg Intravenous BID  . lisinopril  2.5 mg Oral Daily  . metoprolol succinate  25 mg Oral Daily  . sodium chloride flush  3 mL Intravenous Q12H  . spironolactone  12.5 mg Oral Daily   Continuous Infusions: . sodium chloride    . levofloxacin (LEVAQUIN) IV 750 mg (06/15/17 1300)     LOS: 2 days    Time spent: 25  minutes    Kayveon Lennartz,LAWAL, MD [facility] Pager [phone] If 7PM-7AM, please contact night-coverage [email or web address] Password [facility] 06/16/2017, 8:38 AM

## 2017-06-16 NOTE — Plan of Care (Signed)
K+ = 4.1. Diureses continued with IV Lasix

## 2017-06-16 NOTE — Progress Notes (Signed)
Subjective:  Still some cough, mild dyspnea, no chest pain   Objective:  Vital Signs in the last 24 hours: BP 93/69 (BP Location: Right Arm)   Pulse 97   Temp 98.2 F (36.8 C) (Oral)   Resp 14   Ht 5\' 7"  (1.702 m)   Wt 63.8 kg (140 lb 11.2 oz)   SpO2 94%   BMI 22.04 kg/m   Physical Exam: Pleasant BM in NAD Lungs:  Clear  Cardiac:  Regular rhythm, normal S1 and S2, soft  S3 Extremities:  No edema present  Intake/Output from previous day: 03/02 0701 - 03/03 0700 In: 240 [P.O.:240] Out: -  Weight Filed Weights   06/13/17 2139 06/15/17 0300 06/16/17 0433  Weight: 69.9 kg (154 lb) 65.6 kg (144 lb 9.6 oz) 63.8 kg (140 lb 11.2 oz)   Lab Results: Basic Metabolic Panel: Recent Labs    06/14/17 1138 06/14/17 2055 06/15/17 0251  NA 137  --  139  K 3.5 3.7 3.7  CL 101  --  103  CO2 24  --  23  GLUCOSE 131*  --  83  BUN 14  --  12  CREATININE 0.93  --  0.89   CBC: Recent Labs    06/13/17 2136  WBC 9.0  HGB 14.3  HCT 39.8  MCV 95.7  PLT 314   BNP    Component Value Date/Time   BNP 1,699.9 (H) 06/13/2017 2143   Telemetry: Sinus rhythm with PVC's  Assessment/Plan:  1.  Acute on chronic systolic heart failure somewhat better 2.  Left bundle branch 3.  Possible recent URI  Recommendations:  Add spironolactone to his regimen. Weight is currently down to 140 pounds suspect that he is volume neutral now. May switch to po Lasix later today.     Darden Palmer  MD [facility]  06/16/2017, 8:17 AM

## 2017-06-17 DIAGNOSIS — I5023 Acute on chronic systolic (congestive) heart failure: Secondary | ICD-10-CM

## 2017-06-17 LAB — BASIC METABOLIC PANEL
Anion gap: 11 (ref 5–15)
BUN: 17 mg/dL (ref 6–20)
CO2: 24 mmol/L (ref 22–32)
CREATININE: 1.09 mg/dL (ref 0.61–1.24)
Calcium: 9.2 mg/dL (ref 8.9–10.3)
Chloride: 101 mmol/L (ref 101–111)
Glucose, Bld: 100 mg/dL — ABNORMAL HIGH (ref 65–99)
Potassium: 3.3 mmol/L — ABNORMAL LOW (ref 3.5–5.1)
SODIUM: 136 mmol/L (ref 135–145)

## 2017-06-17 MED ORDER — METOPROLOL SUCCINATE ER 25 MG PO TB24: 25.0000 mg | ORAL_TABLET | Freq: Every day | ORAL | 0 refills | Status: DC

## 2017-06-17 MED ORDER — BENZONATATE 200 MG PO CAPS: 200.0000 mg | ORAL_CAPSULE | Freq: Two times a day (BID) | ORAL | 0 refills | Status: DC | PRN

## 2017-06-17 MED ORDER — POTASSIUM CHLORIDE CRYS ER 20 MEQ PO TBCR
40.0000 meq | EXTENDED_RELEASE_TABLET | Freq: Once | ORAL | Status: AC
  Administered 2017-06-17: 40 meq via ORAL
  Filled 2017-06-17: qty 2

## 2017-06-17 MED ORDER — LEVOFLOXACIN 750 MG PO TABS: 750.0000 mg | ORAL_TABLET | Freq: Every day | ORAL | 0 refills | Status: DC

## 2017-06-17 MED ORDER — SPIRONOLACTONE 25 MG PO TABS: 12.5000 mg | ORAL_TABLET | Freq: Every day | ORAL | 0 refills | Status: DC

## 2017-06-17 NOTE — Plan of Care (Signed)
  Activity: Risk for activity intolerance will decrease 06/17/2017 0151 - Completed/Met by Theora Gianotti, RN   Pt ambulates independently with ease. No c/o dyspnea or signs of distress

## 2017-06-17 NOTE — Discharge Summary (Signed)
Physician Discharge Summary  Magdaleno Lortie ZOX:[id number] DOB: Apr 04, 1966 DOA: 06/13/2017  PCP: Patient, No Pcp Per  Admit date: 06/13/2017 Discharge date: 06/17/2017  Time spent: 34 minutes  Recommendations for Outpatient Follow-up:  1. Follow-up with cardiology as recommended  2. Follow-up with primary care physician in [place]  Discharge Diagnoses:  Principal Problem:   Acute on chronic systolic congestive heart failure (HCC) Active Problems:   Pneumonia   Nonischemic cardiomyopathy (HCC)   Discharge Condition: Good  Diet recommendation: Heart healthy  Filed Weights   06/15/17 0300 06/16/17 0433 06/17/17 0351  Weight: 65.6 kg (144 lb 9.6 oz) 63.8 kg (140 lb 11.2 oz) 65.4 kg (144 lb 3.2 oz)    History of present illness:  Patient is a 52 year old gentleman with known history of systolic dysfunction CHF who previously had an EF of 25% in 2017 and and subsequently had ICD placed which was later removed due to infection. Patient was on medical therapy and EF improved to 50%. He has been out of medications for about 6 weeks due to insurance issues. Patient presented with shortness of breath orthopnea and PND. He also has gained weight. He was found to have acute exacerbation of congestive heart failure. Repeat echocardiogram here showed EF has fallen back to 25%. He also had evidence of pneumonia on his chest x-ray. He is being seen by cardiology.    Hospital Course:  Patient was admitted and evaluated. He has been off his cardiac medications due to cost issues but now has insurance. He was aggressively diuresed with IV Lasix. Started on beta blockers an ACE inhibitor's. Aldactone was added. He was seen by cardiology in patient with follow-up planned at [facility]. Potassium was repleted. Patient was also found to have pneumonia on his chest x-ray and was subsequently treated with Levaquin. He has done much better with negative cultures. He was breathing much better at time of  discharge and will follow-up with cardiology as well as his primary care physician in [place] where he lives.  Procedures: Echocardiogram showing EF of 15-20%  Consultations:  Dr. Ellwood Handler, cardiology  Discharge Exam: Vitals:   06/17/17 0351 06/17/17 0835  BP: 96/64 103/72  Pulse: 94   Resp: 16   Temp: 98.3 F (36.8 C)   SpO2: 98%     General: Stable with no acute distress Cardiovascular: Regular rate and rhythm with systolic murmur Respiratory: Good air entry bilaterally with mild expiratory wheezing and crackles  Discharge Instructions   Discharge Instructions    Diet - low sodium heart healthy   Complete by:  As directed    Increase activity slowly   Complete by:  As directed      Allergies as of 06/17/2017      Reactions   Norflex [orphenadrine] Swelling      Medication List    TAKE these medications   aspirin EC 81 MG tablet Take 81 mg by mouth daily.   benzonatate 200 MG capsule Commonly known as:  TESSALON Take 1 capsule (200 mg total) by mouth 2 (two) times daily as needed for cough.   furosemide 40 MG tablet Commonly known as:  LASIX Take 40 mg by mouth daily.   levofloxacin 750 MG tablet Commonly known as:  LEVAQUIN Take 1 tablet (750 mg total) by mouth daily. Start taking on:  06/18/2017   lisinopril 2.5 MG tablet Commonly known as:  PRINIVIL,ZESTRIL Take 2.5 mg by mouth daily.   metoprolol succinate 25 MG 24 hr tablet Commonly known as:  TOPROL-XL Take 1 tablet (25 mg total) by mouth daily. Start taking on:  06/18/2017 What changed:    medication strength  how much to take  additional instructions   omeprazole 20 MG capsule Commonly known as:  PRILOSEC Take 20 mg by mouth daily.   potassium chloride SA 20 MEQ tablet Commonly known as:  K-DUR,KLOR-CON Take 20 mEq by mouth daily.   spironolactone 25 MG tablet Commonly known as:  ALDACTONE Take 0.5 tablets (12.5 mg total) by mouth daily. Start taking on:  06/18/2017       Allergies  Allergen Reactions  . Norflex [Orphenadrine] Swelling      The results of significant diagnostics from this hospitalization (including imaging, microbiology, ancillary and laboratory) are listed below for reference.    Significant Diagnostic Studies: Dg Chest 2 View  Result Date: 06/13/2017 CLINICAL DATA:  52 year old male with shortness of breath. History of COPD and CHF. EXAM: CHEST  2 VIEW COMPARISON:  None. FINDINGS: There is mild diffuse interstitial prominence and Kerley B-lines consistent with interstitial edema. Areas of somewhat nodular density primarily in the right upper lobe may be related to congestion or represent superimposed infection. Clinical correlation is recommended. There are small bilateral pleural effusions. No pneumothorax. Mild cardiomegaly. No acute osseous pathology. IMPRESSION: Mild cardiomegaly with findings of CHF including small bilateral pleural effusions and pulmonary edema. Nodular density in the right upper lobe may be related to vascular congestion or pneumonia. Clinical correlation is recommended. Electronically Signed   By: Elgie Collard M.D.   On: 06/13/2017 22:01   Dg Chest Port 1 View  Result Date: 06/16/2017 CLINICAL DATA:  Dyspnea on exertion EXAM: PORTABLE CHEST 1 VIEW COMPARISON:  06/13/2017 FINDINGS: Residual mild right infrahilar/right lower lobe opacity. This appearance favors mild pneumonia over asymmetric pulmonary edema. Left lung is clear. When compared to the prior, the right upper lobe and perihilar opacities have improved/resolved. No pleural effusion or pneumothorax. The heart is normal in size. IMPRESSION: Residual mild right infrahilar/right lower lobe opacity, worrisome for mild pneumonia, less likely asymmetric pulmonary edema. Overall appearance has improved from the prior. Electronically Signed   By: Charline Bills M.D.   On: 06/16/2017 07:57    Microbiology: No results found for this or any previous visit (from  the past 240 hour(s)).   Labs: Basic Metabolic Panel: Recent Labs  Lab 06/13/17 2136 06/14/17 1138 06/14/17 2055 06/15/17 0251 06/16/17 0903 06/17/17 0344  NA 138 137  --  139 137 136  K 4.2 3.5 3.7 3.7 4.1 3.3*  CL 105 101  --  103 100* 101  CO2 21* 24  --  23 23 24   GLUCOSE 98 131*  --  83 89 100*  BUN 12 14  --  12 13 17   CREATININE 0.99 0.93  --  0.89 0.98 1.09  CALCIUM 9.1 9.2  --  9.2 9.3 9.2  MG  --  2.0  --   --   --   --    Liver Function Tests: No results for input(s): AST, ALT, ALKPHOS, BILITOT, PROT, ALBUMIN in the last 168 hours. No results for input(s): LIPASE, AMYLASE in the last 168 hours. No results for input(s): AMMONIA in the last 168 hours. CBC: Recent Labs  Lab 06/13/17 2136  WBC 9.0  HGB 14.3  HCT 39.8  MCV 95.7  PLT 314   Cardiac Enzymes: No results for input(s): CKTOTAL, CKMB, CKMBINDEX, TROPONINI in the last 168 hours. BNP: BNP (last 3 results) Recent Labs  06/13/17 2143  BNP 1,699.9*    ProBNP (last 3 results) No results for input(s): PROBNP in the last 8760 hours.  CBG: No results for input(s): GLUCAP in the last 168 hours.     SignedLonia Blood MD.  [facility] 06/17/2017, 9:07 AM

## 2017-06-17 NOTE — Progress Notes (Signed)
Discharge order obtained.  IV removed intact, telemetry monitor removed.  Reviewed AVS with patient, including medications, activity/restrictions, follow-up appointments.  Patient verbalized understanding.  Questions asked and answered.  Belongings given to patient.  Copy of AVS signature form placed in chart.   

## 2017-06-17 NOTE — Progress Notes (Addendum)
Progress Note  Patient Name: Carl Ferguson Date of Encounter: 06/17/2017  Primary Cardiologist: [facility] (pt requesting transfer to [facility], preference is [place] location)  Subjective   Feeling ok today. No dyspnea. Laying flat in bed (supine). No orthopnea or PND.   Inpatient Medications    Scheduled Meds: . enoxaparin (LOVENOX) injection  40 mg Subcutaneous Q24H  . furosemide  40 mg Intravenous BID  . levofloxacin  750 mg Oral Daily  . lisinopril  2.5 mg Oral Daily  . metoprolol succinate  25 mg Oral Daily  . sodium chloride flush  3 mL Intravenous Q12H  . spironolactone  12.5 mg Oral Daily   Continuous Infusions: . sodium chloride     PRN Meds: sodium chloride, acetaminophen, benzonatate, guaiFENesin-dextromethorphan, ondansetron (ZOFRAN) IV, sodium chloride flush, zolpidem   Vital Signs    Vitals:   06/16/17 1400 06/16/17 2046 06/17/17 0351 06/17/17 0835  BP: 96/73 (!) 89/69 96/64 103/72  Pulse: 96 99 94   Resp: 20 19 16    Temp: 97.8 F (36.6 C) 97.7 F (36.5 C) 98.3 F (36.8 C)   TempSrc: Oral Oral Oral   SpO2: 100% 100% 98%   Weight:   144 lb 3.2 oz (65.4 kg)   Height:        Intake/Output Summary (Last 24 hours) at 06/17/2017 0922 Last data filed at 06/17/2017 0800 Gross per 24 hour  Intake 1320 ml  Output -  Net 1320 ml   Filed Weights   06/15/17 0300 06/16/17 0433 06/17/17 0351  Weight: 144 lb 9.6 oz (65.6 kg) 140 lb 11.2 oz (63.8 kg) 144 lb 3.2 oz (65.4 kg)    Telemetry    Sinus tach - Personally Reviewed  ECG    LBBB - Personally Reviewed  Physical Exam   GEN: No acute distress.   Neck: No JVD Cardiac: RRR, no murmurs, rubs, or gallops.  Respiratory: Clear to auscultation bilaterally. GI: Soft, nontender, non-distended  MS: No edema; No deformity. Neuro:  Nonfocal  Psych: Normal affect   Labs    Chemistry Recent Labs  Lab 06/15/17 0251 06/16/17 0903 06/17/17 0344  NA 139 137 136  K 3.7 4.1 3.3*  CL 103 100* 101    CO2 23 23 24   GLUCOSE 83 89 100*  BUN 12 13 17   CREATININE 0.89 0.98 1.09  CALCIUM 9.2 9.3 9.2  GFRNONAA >60 >60 >60  GFRAA >60 >60 >60  ANIONGAP 13 14 11      Hematology Recent Labs  Lab 06/13/17 2136  WBC 9.0  RBC 4.16*  HGB 14.3  HCT 39.8  MCV 95.7  MCH 34.4*  MCHC 35.9  RDW 13.7  PLT 314    Cardiac EnzymesNo results for input(s): TROPONINI in the last 168 hours.  Recent Labs  Lab 06/13/17 2156  TROPIPOC 0.01     BNP Recent Labs  Lab 06/13/17 2143  BNP 1,699.9*     DDimer No results for input(s): DDIMER in the last 168 hours.   Radiology    Dg Chest Port 1 View  Result Date: 06/16/2017 CLINICAL DATA:  Dyspnea on exertion EXAM: PORTABLE CHEST 1 VIEW COMPARISON:  06/13/2017 FINDINGS: Residual mild right infrahilar/right lower lobe opacity. This appearance favors mild pneumonia over asymmetric pulmonary edema. Left lung is clear. When compared to the prior, the right upper lobe and perihilar opacities have improved/resolved. No pleural effusion or pneumothorax. The heart is normal in size. IMPRESSION: Residual mild right infrahilar/right lower lobe opacity, worrisome for mild  pneumonia, less likely asymmetric pulmonary edema. Overall appearance has improved from the prior. Electronically Signed   By: Charline Bills M.D.   On: 06/16/2017 07:57    Cardiac Studies   2D Echo 06/14/17 Study Conclusions  - Left ventricle: The cavity size was severely dilated. Systolic   function was normal. The estimated ejection fraction was in the   range of 15% to 20%. Severe, diffuse hypokinesis. - Aortic valve: Transvalvular velocity was within the normal range.   There was no stenosis. There was no regurgitation. - Mitral valve: Transvalvular velocity was within the normal range.   There was no evidence for stenosis. There was moderate to severe   regurgitation. - Left atrium: The atrium was severely dilated. - Right ventricle: The cavity size was normal. Wall thickness  was   normal. Systolic function was normal. - Atrial septum: No defect or patent foramen ovale was identified   by color flow Doppler. - Tricuspid valve: There was mild regurgitation. - Pulmonary arteries: Systolic pressure was moderately increased.   PA peak pressure: 45 mm Hg (S).   Patient Profile     52 y.o. male previously followed at [facility] with past medical history of nonischemic cardiomyopathy (diagnosed in 2011 with EF 30% at that time, improved to 50% by echo in 02/2016), chronic combined systolic and diastolic CHF, prior ICD placement (extraction in 11/2014 due to infection), and tobacco use who is being seen today for the evaluation of CHF at the request of Dr. Mikeal Hawthorne. EF 15-20% by recent echo. Pt also being treated for CAP.   Assessment & Plan    1. Acute on Chronic Systolic CHF/ NICM: EF 15-20%.UOP not recorded yesterday but pt appears euvolemic. No dyspnea. He is on appropriate guidelines directed medical therapy for severe systolic HF. He is on an ACE-I, BB and spironolactone. May consider transition from ACE>>to Entresto. This can be done as outpatient. BP is stable but soft. Additional med titration can be done as outpatient as BP and HR allows. Renal function is stable. Continue Lasix for volume control. Can transition from IV to PO today.   2. CAP: antibiotics per IM.   3. HTN:  Stable on current regimen.   4. Chronic LBBB:   5. Hypokalemia: K 3.3 today, in the setting of IV Lasix. Supplement with KCl. Will order Kdur 40 mEq before d/c. Spironolactone recently added to HF regimen. Also on ACE-I. Will need f/u BMP at time of hospital f/u.  6. H/o ICD s/p Extraction: ICD was extracted due to h/o recurrent infections. Per records from [facility], decision was made not to reimplant, as risk was felt > benefit, due to h/o multiple infections.   Dispo: plan for d/c home today. Pt is requesting transfer from [facility] to [place]. He lives in [place] and would  prefer to f/u with our providers there. Pt understands that he would have to be seen in [place] for the short term, until our providers are back in [place]. He is ok with this. I will arrange post hospital f/u.    For questions or updates, please contact [facility] Please consult [email or web address] for contact info under Cardiology/STEMI.      Signed, Robbie Lis, PA-C  06/17/2017, 9:22 AM     Attending Note:   The patient was seen and examined.  Agree with assessment and plan as noted above.  Changes made to the above note as needed.  Patient seen and independently examined with Boyce Medici, PA .  We discussed all aspects of the encounter. I agree with the assessment and plan as stated above.  1. Acute on chronic combined S/D CHF: Pt has diuresed and is feeling well. Continue current meds  agree with transition to [facility] as OP.  We may need to decrease his diuretics to allow his BP to increase slightly to make this change.   Has ambulated without difficulties.  OK for DC to home today    I have spent a total of 40 minutes with patient reviewing hospital  notes , telemetry, EKGs, labs and examining patient as well as establishing an assessment and plan that was discussed with the patient. > 50% of time was spent in direct patient care.     Vesta Mixer, Montez Hageman., MD, [facility] 06/17/2017, 9:54 AM [address],  [address] Office [phone] Pager [phone]

## 2017-06-17 NOTE — Care Management Note (Signed)
Case Management Note  Patient Details  Name: Carl Ferguson MRN: [id number] Date of Birth: [birth year]  Subjective/Objective: Pt presented for Acute on Chronic CHF- Pt was initiated on IV Lasix. PTA from home and plans to return home. Pt has Medicaid and medications will cost less than $3.00.  PCP is [facility].                   Action/Plan: Copy of Medicaid card was faxed to Admitting. No further needs from this CM.   Expected Discharge Date:  06/17/17               Expected Discharge Plan:  Home/Self Care  In-House Referral:  NA  Discharge planning Services  CM Consult  Post Acute Care Choice:  NA Choice offered to:  NA  DME Arranged:  N/A DME Agency:  NA  HH Arranged:  NA HH Agency:  NA  Status of Service:  Completed, signed off  If discussed at Long Length of Stay Meetings, dates discussed:    Additional Comments:  Gala Lewandowsky, RN 06/17/2017, 10:11 AM

## 2017-06-24 ENCOUNTER — Encounter: Payer: Self-pay | Admitting: Cardiology

## 2017-06-24 ENCOUNTER — Ambulatory Visit (INDEPENDENT_AMBULATORY_CARE_PROVIDER_SITE_OTHER): Payer: Self-pay | Admitting: Cardiology

## 2017-06-24 VITALS — BP 128/80 | HR 96 | Ht 67.0 in | Wt 153.8 lb

## 2017-06-24 DIAGNOSIS — I428 Other cardiomyopathies: Secondary | ICD-10-CM

## 2017-06-24 DIAGNOSIS — IMO0001 Reserved for inherently not codable concepts without codable children: Secondary | ICD-10-CM

## 2017-06-24 DIAGNOSIS — I420 Dilated cardiomyopathy: Secondary | ICD-10-CM | POA: Insufficient documentation

## 2017-06-24 DIAGNOSIS — I472 Ventricular tachycardia: Secondary | ICD-10-CM

## 2017-06-24 DIAGNOSIS — I4729 Other ventricular tachycardia: Secondary | ICD-10-CM

## 2017-06-24 DIAGNOSIS — I10 Essential (primary) hypertension: Secondary | ICD-10-CM

## 2017-06-24 DIAGNOSIS — I5023 Acute on chronic systolic (congestive) heart failure: Secondary | ICD-10-CM

## 2017-06-24 DIAGNOSIS — F17200 Nicotine dependence, unspecified, uncomplicated: Secondary | ICD-10-CM

## 2017-06-24 NOTE — Patient Instructions (Signed)
Medication Instructions:  Your physician recommends that you continue on your current medications as directed. Please refer to the Current Medication list given to you today.  Labwork: Your physician recommends that you have the following labs drawn: CBC, BMP, and INR  Testing/Procedures:   [facility] CARDIOVASCULAR DIVISION [facility] [place] [address] [place] [address] Dept: [phone] Loc: [phone]  Julious Langlois  06/24/2017  You are scheduled for a Cardiac Catheterization on Wednesday, March 13 with Dr. [name].  1. Please arrive at the [facility] (Main Entrance A) at [facility]: [address] [place], [place] [address] at 12:30 PM (two hours before your procedure to ensure your preparation). Free valet parking service is available.   Special note: Every effort is made to have your procedure done on time. Please understand that emergencies sometimes delay scheduled procedures.  2. Diet: Do not eat or drink anything after midnight prior to your procedure except sips of water to take medications.  3. Labs: Done on 03/11.  4. Medication instructions in preparation for your procedure:  Do not take your lasix the morning of the heart cath.  On the morning of your procedure, take your Aspirin and any morning medicines NOT listed above.  You may use sips of water.  5. Plan for one night stay--bring personal belongings. 6. Bring a current list of your medications and current insurance cards. 7. You MUST have a responsible person to drive you home. 8. Someone MUST be with you the first 24 hours after you arrive home or your discharge will be delayed. 9. Please wear clothes that are easy to get on and off and wear slip-on shoes.  Thank you for allowing [place] to care for you!   -- [facility]   Follow-Up: Your physician recommends that you schedule a follow-up  appointment in: 1 month  Any Other Special Instructions Will Be Listed Below (If Applicable).     If you need a refill on your cardiac medications before your next appointment, please call your pharmacy.   [facility]  Garey Ham, RN, BSN   Coronary Angiogram With Stent Coronary angiogram with stent placement is a procedure to widen or open a narrow blood vessel of the heart (coronary artery). Arteries may become blocked by cholesterol buildup (plaques) in the lining of the wall. When a coronary artery becomes partially blocked, blood flow to that area decreases. This may lead to chest pain or a heart attack (myocardial infarction). A stent is a small piece of metal that looks like mesh or a spring. Stent placement may be done as treatment for a heart attack or right after a coronary angiogram in which a blocked artery is found. Let your health care provider know about:  Any allergies you have.  All medicines you are taking, including vitamins, herbs, eye drops, creams, and over-the-counter medicines.  Any problems you or family members have had with anesthetic medicines.  Any blood disorders you have.  Any surgeries you have had.  Any medical conditions you have.  Whether you are pregnant or may be pregnant. What are the risks? Generally, this is a safe procedure. However, problems may occur, including:  Damage to the heart or its blood vessels.  A return of blockage.  Bleeding, infection, or bruising at the insertion site.  A collection of blood under the skin (hematoma) at the insertion site.  A blood clot in another  part of the body.  Kidney injury.  Allergic reaction to the dye or contrast that is used.  Bleeding into the abdomen (retroperitoneal bleeding).  What happens before the procedure? Staying hydrated Follow instructions from your health care provider about hydration, which may include:  Up to 2 hours before the procedure - you may continue to  drink clear liquids, such as water, clear fruit juice, black coffee, and plain tea.  Eating and drinking restrictions Follow instructions from your health care provider about eating and drinking, which may include:  8 hours before the procedure - stop eating heavy meals or foods such as meat, fried foods, or fatty foods.  6 hours before the procedure - stop eating light meals or foods, such as toast or cereal.  2 hours before the procedure - stop drinking clear liquids.  Ask your health care provider about:  Changing or stopping your regular medicines. This is especially important if you are taking diabetes medicines or blood thinners.  Taking medicines such as ibuprofen. These medicines can thin your blood. Do not take these medicines before your procedure if your health care provider instructs you not to. Generally, aspirin is recommended before a procedure of passing a small, thin tube (catheter) through a blood vessel and into the heart (cardiac catheterization).  What happens during the procedure?  An IV tube will be inserted into one of your veins.  You will be given one or more of the following: ? A medicine to help you relax (sedative). ? A medicine to numb the area where the catheter will be inserted into an artery (local anesthetic).  To reduce your risk of infection: ? Your health care team will wash or sanitize their hands. ? Your skin will be washed with soap. ? Hair may be removed from the area where the catheter will be inserted.  Using a guide wire, the catheter will be inserted into an artery. The location may be in your groin, in your wrist, or in the fold of your arm (near your elbow).  A type of X-ray (fluoroscopy) will be used to help guide the catheter to the opening of the arteries in the heart.  A dye will be injected into the catheter, and X-rays will be taken. The dye will help to show where any narrowing or blockages are located in the arteries.  A tiny  wire will be guided to the blocked spot, and a balloon will be inflated to make the artery wider.  The stent will be expanded and will crush the plaques into the wall of the vessel. The stent will hold the area open and improve the blood flow. Most stents have a drug coating to reduce the risk of the stent narrowing over time.  The artery may be made wider using a drill, laser, or other tools to remove plaques.  When the blood flow is better, the catheter will be removed. The lining of the artery will grow over the stent, which stays where it was placed. This procedure may vary among health care providers and hospitals. What happens after the procedure?  If the procedure is done through the leg, you will be kept in bed lying flat for about 6 hours. You will be instructed to not bend and not cross your legs.  The insertion site will be checked frequently.  The pulse in your foot or wrist will be checked frequently.  You may have additional blood tests, X-rays, and a test that records the electrical activity  of your heart (electrocardiogram, or ECG). This information is not intended to replace advice given to you by your health care provider. Make sure you discuss any questions you have with your health care provider. Document Released: 10/07/2002 Document Revised: 12/01/2015 Document Reviewed: 11/06/2015 Elsevier Interactive Patient Education  [employer].

## 2017-06-24 NOTE — H&P (View-Only) (Signed)
Cardiology Office Note:    Date:  06/24/2017   ID:  Carl Ferguson, DOB [birth year], MRN [id number]  PCP:  Eunice Blase, PA-C  Cardiologist:  Garwin Brothers, MD   Referring MD: No ref. provider found    ASSESSMENT:    1. Acute on chronic systolic congestive heart failure (HCC)   2. Essential hypertension   3. Nonsustained ventricular tachycardia (HCC)   4. Smoking   5. Nonischemic cardiomyopathy (HCC)   6. Dilated cardiomyopathy (HCC)    PLAN:    In order of problems listed above:  1. Primary prevention stressed with patient.  Importance of compliance with diet and medications stressed and he vocalized understanding.  Congestive heart failure education especially in reference to salt intake was discussed.  Patient was given this information at the hospital. 2. This is a gentleman, with complex medical problems.  He had a similar episode in the past of cardiomyopathy which is better with medication given the fact that he became noncompliant and stopped taking her medications.  Hopefully this will get better with medical therapy.  For evaluation of this new cardiomyopathy problem I discussed with coronary angiography and left heart catheterization.  He is agreeable.I discussed coronary angiography and left heart catheterization with the patient at extensive length. Procedure, benefits and potential risks were explained. Patient had multiple questions which were answered to the patient's satisfaction. Patient agreed and consented for the procedure. Further recommendations will be made based on the findings of the coronary angiography. In the interim. The patient has any significant symptoms he knows to go to the nearest emergency room. 3. I spent 5 minutes with the patient discussing solely about smoking. Smoking cessation was counseled. I suggested to the patient also different medications and pharmacological interventions. Patient is keen to try stopping on its own at this time. He will  get back to me if he needs any further assistance in this matter.   Medication Adjustments/Labs and Tests Ordered: Current medicines are reviewed at length with the patient today.  Concerns regarding medicines are outlined above.  No orders of the defined types were placed in this encounter.  No orders of the defined types were placed in this encounter.    History of Present Illness:    Carl Ferguson is a 52 y.o. male who is being seen today for the evaluation of acute congestive heart failure.  Patient is a pleasant 52 year old male.  He has past medical history of essential hypertension.  He mentions to me that many years ago he had cardiomyopathy for which he received a defibrillator.  Subsequently the defibrillator had an infection and it was removed.  He says that he was on medications and his ejection fraction improved.  Subsequently he was visiting [place] and spending many months thereafter time in [employer] work.  The patient was lax and stopped taking his medications.  He fell sick.  He was short of breath and came to [place] and he was admitted to the hospital with acute congestive heart failure with severely depressed ejection fraction.  Patient was reinitiated on medications and doing better at this time.  No chest pain orthopnea or PND at this time.  Unfortunately he continues to smoke.  Past Medical History:  Diagnosis Date  . CHF (congestive heart failure) (HCC)   . COPD (chronic obstructive pulmonary disease) (HCC)   . Nonischemic cardiomyopathy (HCC)    a. diagnosed in 2011 with EF 30% at that time b. EF improved to 50% by echo  in 02/2016    No past surgical history on file.  Current Medications: Current Meds  Medication Sig  . aspirin EC 81 MG tablet Take 81 mg by mouth daily.  . furosemide (LASIX) 40 MG tablet Take 40 mg by mouth daily.  Marland Kitchen levofloxacin (LEVAQUIN) 750 MG tablet Take 1 tablet (750 mg total) by mouth daily.  Marland Kitchen lisinopril (PRINIVIL,ZESTRIL)  2.5 MG tablet Take 2.5 mg by mouth daily.  . metoprolol succinate (TOPROL-XL) 25 MG 24 hr tablet Take 1 tablet (25 mg total) by mouth daily.  Marland Kitchen omeprazole (PRILOSEC) 20 MG capsule Take 20 mg by mouth daily.  . potassium chloride SA (K-DUR,KLOR-CON) 20 MEQ tablet Take 20 mEq by mouth daily.  . [DISCONTINUED] spironolactone (ALDACTONE) 25 MG tablet Take 0.5 tablets (12.5 mg total) by mouth daily.     Allergies:   Norflex [orphenadrine] and Orphenadrine citrate   Social History   Socioeconomic History  . Marital status: Single    Spouse name: None  . Number of children: None  . Years of education: None  . Highest education level: None  Social Needs  . Financial resource strain: None  . Food insecurity - worry: None  . Food insecurity - inability: None  . Transportation needs - medical: None  . Transportation needs - non-medical: None  Occupational History  . None  Tobacco Use  . Smoking status: Current Some Day Smoker  . Smokeless tobacco: Never Used  Substance and Sexual Activity  . Alcohol use: Yes    Frequency: Never    Comment: 3-4 beers daily  . Drug use: No  . Sexual activity: None  Other Topics Concern  . None  Social History Narrative  . None     Family History: The patient's family history includes Heart attack in his mother. There is no history of Sudden Cardiac Death.  ROS:   Please see the history of present illness.    All other systems reviewed and are negative.  EKGs/Labs/Other Studies Reviewed:    The following studies were reviewed today: I reviewed echocardiogram and EKG reports and hospitalization at extensive length.  Medications were reviewed.   Recent Labs: 06/13/2017: B Natriuretic Peptide 1,699.9; Hemoglobin 14.3; Platelets 314 06/14/2017: Magnesium 2.0 06/17/2017: BUN 17; Creatinine, Ser 1.09; Potassium 3.3; Sodium 136  Recent Lipid Panel No results found for: CHOL, TRIG, HDL, CHOLHDL, VLDL, LDLCALC, LDLDIRECT  Physical Exam:    VS:  BP  128/80 (BP Location: Right Arm, Patient Position: Sitting, Cuff Size: Normal)   Pulse 96   Ht 5\' 7"  (1.702 m)   Wt 153 lb 12.8 oz (69.8 kg)   SpO2 98%   BMI 24.09 kg/m     Wt Readings from Last 3 Encounters:  06/24/17 153 lb 12.8 oz (69.8 kg)  06/17/17 144 lb 3.2 oz (65.4 kg)     GEN: Patient is in no acute distress HEENT: Normal NECK: No JVD; No carotid bruits LYMPHATICS: No lymphadenopathy CARDIAC: S1 S2 regular, 2/6 systolic murmur at the apex. RESPIRATORY:  Clear to auscultation without rales, wheezing or rhonchi  ABDOMEN: Soft, non-tender, non-distended MUSCULOSKELETAL:  No edema; No deformity  SKIN: Warm and dry NEUROLOGIC:  Alert and oriented x 3 PSYCHIATRIC:  Normal affect    Signed, Garwin Brothers, MD  06/24/2017 4:27 PM    [facility]

## 2017-06-24 NOTE — Progress Notes (Signed)
Cardiology Office Note:    Date:  06/24/2017   ID:  Carl Ferguson, DOB 07/17/1965, MRN [id number]  PCP:  O'Buch, Greta, PA-C  Cardiologist:  Shamarion Coots R Ariany Kesselman, MD   Referring MD: No ref. provider found    ASSESSMENT:    1. Acute on chronic systolic congestive heart failure (HCC)   2. Essential hypertension   3. Nonsustained ventricular tachycardia (HCC)   4. Smoking   5. Nonischemic cardiomyopathy (HCC)   6. Dilated cardiomyopathy (HCC)    PLAN:    In order of problems listed above:  1. Primary prevention stressed with patient.  Importance of compliance with diet and medications stressed and he vocalized understanding.  Congestive heart failure education especially in reference to salt intake was discussed.  Patient was given this information at the hospital. 2. This is a gentleman, with complex medical problems.  He had a similar episode in the past of cardiomyopathy which is better with medication given the fact that he became noncompliant and stopped taking her medications.  Hopefully this will get better with medical therapy.  For evaluation of this new cardiomyopathy problem I discussed with coronary angiography and left heart catheterization.  He is agreeable.I discussed coronary angiography and left heart catheterization with the patient at extensive length. Procedure, benefits and potential risks were explained. Patient had multiple questions which were answered to the patient's satisfaction. Patient agreed and consented for the procedure. Further recommendations will be made based on the findings of the coronary angiography. In the interim. The patient has any significant symptoms he knows to go to the nearest emergency room. 3. I spent 5 minutes with the patient discussing solely about smoking. Smoking cessation was counseled. I suggested to the patient also different medications and pharmacological interventions. Patient is keen to try stopping on its own at this time. He will  get back to me if he needs any further assistance in this matter.   Medication Adjustments/Labs and Tests Ordered: Current medicines are reviewed at length with the patient today.  Concerns regarding medicines are outlined above.  No orders of the defined types were placed in this encounter.  No orders of the defined types were placed in this encounter.    History of Present Illness:    Carl Ferguson is a 51 y.o. male who is being seen today for the evaluation of acute congestive heart failure.  Patient is a pleasant 51-year-old male.  He has past medical history of essential hypertension.  He mentions to me that many years ago he had cardiomyopathy for which he received a defibrillator.  Subsequently the defibrillator had an infection and it was removed.  He says that he was on medications and his ejection fraction improved.  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  The patient was lax and stopped taking his medications.  He fell sick.  He was short of breath and came to [place] and he was admitted to the hospital with acute congestive heart failure with severely depressed ejection fraction.  Patient was reinitiated on medications and doing better at this time.  No chest pain orthopnea or PND at this time.  Unfortunately he continues to smoke.  Past Medical History:  Diagnosis Date  . CHF (congestive heart failure) (HCC)   . COPD (chronic obstructive pulmonary disease) (HCC)   . Nonischemic cardiomyopathy (HCC)    a. diagnosed in 2011 with EF 30% at that time b. EF improved to 50% by echo   in 02/2016    No past surgical history on file.  Current Medications: Current Meds  Medication Sig  . aspirin EC 81 MG tablet Take 81 mg by mouth daily.  . furosemide (LASIX) 40 MG tablet Take 40 mg by mouth daily.  . levofloxacin (LEVAQUIN) 750 MG tablet Take 1 tablet (750 mg total) by mouth daily.  . lisinopril (PRINIVIL,ZESTRIL)  2.5 MG tablet Take 2.5 mg by mouth daily.  . metoprolol succinate (TOPROL-XL) 25 MG 24 hr tablet Take 1 tablet (25 mg total) by mouth daily.  . omeprazole (PRILOSEC) 20 MG capsule Take 20 mg by mouth daily.  . potassium chloride SA (K-DUR,KLOR-CON) 20 MEQ tablet Take 20 mEq by mouth daily.  . [DISCONTINUED] spironolactone (ALDACTONE) 25 MG tablet Take 0.5 tablets (12.5 mg total) by mouth daily.     Allergies:   Norflex [orphenadrine] and Orphenadrine citrate   Social History   Socioeconomic History  . Marital status: Single    Spouse name: None  . Number of children: None  . Years of education: None  . Highest education level: None  Social Needs  . Financial resource strain: None  . Food insecurity - worry: None  . Food insecurity - inability: None  . Transportation needs - medical: None  . Transportation needs - non-medical: None  Occupational History  . None  Tobacco Use  . Smoking status: Current Some Day Smoker  . Smokeless tobacco: Never Used  Substance and Sexual Activity  . Alcohol use: Yes    Frequency: Never    Comment: 3-4 beers daily  . Drug use: No  . Sexual activity: None  Other Topics Concern  . None  Social History Narrative  . None     Family History: The patient's family history includes Heart attack in his mother. There is no history of Sudden Cardiac Death.  ROS:   Please see the history of present illness.    All other systems reviewed and are negative.  EKGs/Labs/Other Studies Reviewed:    The following studies were reviewed today: I reviewed echocardiogram and EKG reports and hospitalization at extensive length.  Medications were reviewed.   Recent Labs: 06/13/2017: B Natriuretic Peptide 1,699.9; Hemoglobin 14.3; Platelets 314 06/14/2017: Magnesium 2.0 06/17/2017: BUN 17; Creatinine, Ser 1.09; Potassium 3.3; Sodium 136  Recent Lipid Panel No results found for: CHOL, TRIG, HDL, CHOLHDL, VLDL, LDLCALC, LDLDIRECT  Physical Exam:    VS:  BP  128/80 (BP Location: Right Arm, Patient Position: Sitting, Cuff Size: Normal)   Pulse 96   Ht 5' 7" (1.702 m)   Wt 153 lb 12.8 oz (69.8 kg)   SpO2 98%   BMI 24.09 kg/m     Wt Readings from Last 3 Encounters:  06/24/17 153 lb 12.8 oz (69.8 kg)  06/17/17 144 lb 3.2 oz (65.4 kg)     GEN: Patient is in no acute distress HEENT: Normal NECK: No JVD; No carotid bruits LYMPHATICS: No lymphadenopathy CARDIAC: S1 S2 regular, 2/6 systolic murmur at the apex. RESPIRATORY:  Clear to auscultation without rales, wheezing or rhonchi  ABDOMEN: Soft, non-tender, non-distended MUSCULOSKELETAL:  No edema; No deformity  SKIN: Warm and dry NEUROLOGIC:  Alert and oriented x 3 PSYCHIATRIC:  Normal affect    Signed, Kaya Klausing R Aamari West, MD  06/24/2017 4:27 PM    [facility]   

## 2017-06-25 ENCOUNTER — Telehealth: Payer: Self-pay | Admitting: *Deleted

## 2017-06-25 LAB — CBC WITH DIFFERENTIAL/PLATELET
BASOS: 0 %
Basophils Absolute: 0 10*3/uL (ref 0.0–0.2)
EOS (ABSOLUTE): 0.1 10*3/uL (ref 0.0–0.4)
Eos: 1 %
HEMATOCRIT: 46.7 % (ref 37.5–51.0)
Hemoglobin: 16.4 g/dL (ref 13.0–17.7)
IMMATURE GRANS (ABS): 0 10*3/uL (ref 0.0–0.1)
IMMATURE GRANULOCYTES: 0 %
LYMPHS: 32 %
Lymphocytes Absolute: 2.7 10*3/uL (ref 0.7–3.1)
MCH: 33.7 pg — ABNORMAL HIGH (ref 26.6–33.0)
MCHC: 35.1 g/dL (ref 31.5–35.7)
MCV: 96 fL (ref 79–97)
Monocytes Absolute: 0.8 10*3/uL (ref 0.1–0.9)
Monocytes: 10 %
NEUTROS PCT: 57 %
Neutrophils Absolute: 4.6 10*3/uL (ref 1.4–7.0)
PLATELETS: 298 10*3/uL (ref 150–379)
RBC: 4.87 x10E6/uL (ref 4.14–5.80)
RDW: 13.5 % (ref 12.3–15.4)
WBC: 8.3 10*3/uL (ref 3.4–10.8)

## 2017-06-25 LAB — BASIC METABOLIC PANEL
BUN/Creatinine Ratio: 15 (ref 9–20)
BUN: 17 mg/dL (ref 6–24)
CALCIUM: 10.1 mg/dL (ref 8.7–10.2)
CO2: 23 mmol/L (ref 20–29)
CREATININE: 1.12 mg/dL (ref 0.76–1.27)
Chloride: 100 mmol/L (ref 96–106)
GFR calc Af Amer: 88 mL/min/{1.73_m2} (ref >59)
GFR, EST NON AFRICAN AMERICAN: 76 mL/min/{1.73_m2} (ref >59)
Glucose: 92 mg/dL (ref 65–99)
POTASSIUM: 5 mmol/L (ref 3.5–5.2)
Sodium: 140 mmol/L (ref 134–144)

## 2017-06-25 LAB — PROTIME-INR
INR: 1.1 (ref 0.8–1.2)
Prothrombin Time: 11.2 s (ref 9.1–12.0)

## 2017-06-25 NOTE — Telephone Encounter (Signed)
Pt contacted pre-catheterization scheduled at [facility] for: Wednesday March 13,2019 2:30 PM Verified arrival time and place: [facility] Main Entrance A/[facility] at: 12:30 PM  No solid food after midnight, clear liquids until 7:30 AM, then nothing to eat or drink except sips of water with medications Verified allergies in Epic. Verified no diabetes medications.  Hold: Furosemide AM of cath.  AM meds can be  taken pre-cath with sip of water including: ASA 81 mg     Confirmed patient has responsible person to drive home post procedure and observe patient for 24 hours: yes

## 2017-06-26 ENCOUNTER — Encounter (HOSPITAL_COMMUNITY)
Admission: RE | Disposition: A | Discharge status: Home / Self Care | Payer: Self-pay | Source: Ambulatory Visit | Attending: Cardiovascular Disease

## 2017-06-26 ENCOUNTER — Ambulatory Visit (HOSPITAL_COMMUNITY)
Admission: RE | Admit: 2017-06-26 | Discharge: 2017-06-26 | Disposition: A | Discharge status: Home / Self Care | Payer: Medicaid Other | Source: Ambulatory Visit | Attending: Cardiovascular Disease | Admitting: Cardiovascular Disease

## 2017-06-26 DIAGNOSIS — I428 Other cardiomyopathies: Secondary | ICD-10-CM | POA: Diagnosis not present

## 2017-06-26 DIAGNOSIS — J449 Chronic obstructive pulmonary disease, unspecified: Secondary | ICD-10-CM | POA: Diagnosis not present

## 2017-06-26 DIAGNOSIS — I110 Hypertensive heart disease with heart failure: Secondary | ICD-10-CM | POA: Insufficient documentation

## 2017-06-26 DIAGNOSIS — I5022 Chronic systolic (congestive) heart failure: Secondary | ICD-10-CM | POA: Diagnosis present

## 2017-06-26 DIAGNOSIS — Z9119 Patient's noncompliance with other medical treatment and regimen: Secondary | ICD-10-CM | POA: Insufficient documentation

## 2017-06-26 DIAGNOSIS — I420 Dilated cardiomyopathy: Secondary | ICD-10-CM | POA: Diagnosis not present

## 2017-06-26 DIAGNOSIS — Z79899 Other long term (current) drug therapy: Secondary | ICD-10-CM | POA: Diagnosis not present

## 2017-06-26 DIAGNOSIS — Z888 Allergy status to other drugs, medicaments and biological substances status: Secondary | ICD-10-CM | POA: Insufficient documentation

## 2017-06-26 DIAGNOSIS — F17200 Nicotine dependence, unspecified, uncomplicated: Secondary | ICD-10-CM | POA: Diagnosis not present

## 2017-06-26 DIAGNOSIS — I5023 Acute on chronic systolic (congestive) heart failure: Secondary | ICD-10-CM

## 2017-06-26 DIAGNOSIS — I472 Ventricular tachycardia: Secondary | ICD-10-CM | POA: Insufficient documentation

## 2017-06-26 DIAGNOSIS — Z7982 Long term (current) use of aspirin: Secondary | ICD-10-CM | POA: Insufficient documentation

## 2017-06-26 HISTORY — PX: LEFT HEART CATH AND CORONARY ANGIOGRAPHY: CATH118249

## 2017-06-26 SURGERY — LEFT HEART CATH AND CORONARY ANGIOGRAPHY
Anesthesia: LOCAL

## 2017-06-26 MED ORDER — SODIUM CHLORIDE 0.9 % WEIGHT BASED INFUSION: 1.0000 mL/kg/h | INTRAVENOUS | Status: DC

## 2017-06-26 MED ORDER — SODIUM CHLORIDE 0.9% FLUSH: 3.0000 mL | INTRAVENOUS | Status: DC | PRN

## 2017-06-26 MED ORDER — LIDOCAINE HCL (PF) 1 % IJ SOLN
INTRAMUSCULAR | Status: AC
  Filled 2017-06-26: qty 30

## 2017-06-26 MED ORDER — SODIUM CHLORIDE 0.9 % IV SOLN: 250.0000 mL | INTRAVENOUS | Status: DC | PRN

## 2017-06-26 MED ORDER — HEPARIN (PORCINE) IN NACL 2-0.9 UNIT/ML-% IJ SOLN
INTRAMUSCULAR | Status: AC
  Filled 2017-06-26: qty 1000

## 2017-06-26 MED ORDER — VERAPAMIL HCL 2.5 MG/ML IV SOLN
INTRAVENOUS | Status: DC | PRN
  Administered 2017-06-26: 10 mL via INTRA_ARTERIAL

## 2017-06-26 MED ORDER — MIDAZOLAM HCL 2 MG/2ML IJ SOLN
INTRAMUSCULAR | Status: AC
  Filled 2017-06-26: qty 2

## 2017-06-26 MED ORDER — FENTANYL CITRATE (PF) 100 MCG/2ML IJ SOLN
INTRAMUSCULAR | Status: AC
  Filled 2017-06-26: qty 2

## 2017-06-26 MED ORDER — FENTANYL CITRATE (PF) 100 MCG/2ML IJ SOLN
INTRAMUSCULAR | Status: DC | PRN
  Administered 2017-06-26 (×2): 25 ug via INTRAVENOUS

## 2017-06-26 MED ORDER — HEPARIN SODIUM (PORCINE) 1000 UNIT/ML IJ SOLN
INTRAMUSCULAR | Status: DC | PRN
  Administered 2017-06-26: 3500 [IU] via INTRAVENOUS

## 2017-06-26 MED ORDER — FUROSEMIDE 40 MG PO TABS: 40.0000 mg | ORAL_TABLET | Freq: Two times a day (BID) | ORAL | 5 refills | Status: DC

## 2017-06-26 MED ORDER — IOPAMIDOL (ISOVUE-370) INJECTION 76%
INTRAVENOUS | Status: AC
  Filled 2017-06-26: qty 100

## 2017-06-26 MED ORDER — HEPARIN (PORCINE) IN NACL 2-0.9 UNIT/ML-% IJ SOLN
INTRAMUSCULAR | Status: AC | PRN
  Administered 2017-06-26 (×2): 500 mL

## 2017-06-26 MED ORDER — MIDAZOLAM HCL 2 MG/2ML IJ SOLN
INTRAMUSCULAR | Status: DC | PRN
  Administered 2017-06-26 (×2): 1 mg via INTRAVENOUS

## 2017-06-26 MED ORDER — DIAZEPAM 5 MG PO TABS
5.0000 mg | ORAL_TABLET | Freq: Once | ORAL | Status: AC
  Administered 2017-06-26: 5 mg via ORAL

## 2017-06-26 MED ORDER — IOPAMIDOL (ISOVUE-370) INJECTION 76%
INTRAVENOUS | Status: DC | PRN
  Administered 2017-06-26: 40 mL via INTRA_ARTERIAL

## 2017-06-26 MED ORDER — HEPARIN SODIUM (PORCINE) 1000 UNIT/ML IJ SOLN
INTRAMUSCULAR | Status: AC
  Filled 2017-06-26: qty 1

## 2017-06-26 MED ORDER — SODIUM CHLORIDE 0.9 % WEIGHT BASED INFUSION
3.0000 mL/kg/h | INTRAVENOUS | Status: AC
  Administered 2017-06-26: 3 mL/kg/h via INTRAVENOUS

## 2017-06-26 MED ORDER — SODIUM CHLORIDE 0.9% FLUSH: 3.0000 mL | Freq: Two times a day (BID) | INTRAVENOUS | Status: DC

## 2017-06-26 MED ORDER — ASPIRIN 81 MG PO CHEW: 81.0000 mg | CHEWABLE_TABLET | ORAL | Status: DC

## 2017-06-26 MED ORDER — DIAZEPAM 5 MG PO TABS
ORAL_TABLET | ORAL | Status: AC
  Filled 2017-06-26: qty 1

## 2017-06-26 MED ORDER — LIDOCAINE HCL (PF) 1 % IJ SOLN
INTRAMUSCULAR | Status: DC | PRN
  Administered 2017-06-26: 2 mL

## 2017-06-26 SURGICAL SUPPLY — 12 items
BAND ZEPHYR COMPRESS 30 LONG (HEMOSTASIS) ×2 IMPLANT
CATH INFINITI 5 FR JL3.5 (CATHETERS) ×2 IMPLANT
CATH INFINITI 5FR ANG PIGTAIL (CATHETERS) IMPLANT
CATH INFINITI JR4 5F (CATHETERS) ×2 IMPLANT
GUIDEWIRE INQWIRE 1.5J.035X260 (WIRE) ×1 IMPLANT
INQWIRE 1.5J .035X260CM (WIRE) ×2
KIT HEART LEFT (KITS) ×2 IMPLANT
NEEDLE PERC 21GX4CM (NEEDLE) ×2 IMPLANT
PACK CARDIAC CATHETERIZATION (CUSTOM PROCEDURE TRAY) ×2 IMPLANT
SHEATH RAIN RADIAL 21G 6FR (SHEATH) ×2 IMPLANT
TRANSDUCER W/STOPCOCK (MISCELLANEOUS) ×2 IMPLANT
TUBING CIL FLEX 10 FLL-RA (TUBING) ×2 IMPLANT

## 2017-06-26 NOTE — Interval H&P Note (Signed)
History and Physical Interval Note:  06/26/2017 3:03 PM  Carl Ferguson  has presented today for surgery, with the diagnosis of chf   cardiomyopathy  The various methods of treatment have been discussed with the patient and family. After consideration of risks, benefits and other options for treatment, the patient has consented to  Procedure(s): LEFT HEART CATH AND CORONARY ANGIOGRAPHY (N/A) as a surgical intervention .  The patient's history has been reviewed, patient examined, no change in status, stable for surgery.  I have reviewed the patient's chart and labs.  Questions were answered to the patient's satisfaction.     [name]

## 2017-06-26 NOTE — Discharge Instructions (Signed)

## 2017-06-27 ENCOUNTER — Encounter (HOSPITAL_COMMUNITY): Payer: Self-pay | Admitting: Cardiovascular Disease

## 2017-06-27 MED FILL — Heparin Sodium (Porcine) 2 Unit/ML in Sodium Chloride 0.9%: INTRAMUSCULAR | Qty: 1000 | Status: AC

## 2017-07-02 ENCOUNTER — Encounter (HOSPITAL_COMMUNITY): Payer: Self-pay

## 2017-07-02 ENCOUNTER — Ambulatory Visit (HOSPITAL_COMMUNITY)
Admission: RE | Admit: 2017-07-02 | Discharge: 2017-07-02 | Disposition: A | Discharge status: Home / Self Care | Payer: Medicaid Other | Source: Ambulatory Visit | Attending: Cardiology | Admitting: Cardiology

## 2017-07-02 VITALS — BP 98/86 | HR 101 | Ht 67.0 in | Wt 151.8 lb

## 2017-07-02 DIAGNOSIS — I447 Left bundle-branch block, unspecified: Secondary | ICD-10-CM | POA: Insufficient documentation

## 2017-07-02 DIAGNOSIS — I5022 Chronic systolic (congestive) heart failure: Secondary | ICD-10-CM | POA: Diagnosis not present

## 2017-07-02 DIAGNOSIS — I472 Ventricular tachycardia: Secondary | ICD-10-CM

## 2017-07-02 DIAGNOSIS — I110 Hypertensive heart disease with heart failure: Secondary | ICD-10-CM | POA: Diagnosis not present

## 2017-07-02 DIAGNOSIS — Z7982 Long term (current) use of aspirin: Secondary | ICD-10-CM | POA: Insufficient documentation

## 2017-07-02 DIAGNOSIS — I10 Essential (primary) hypertension: Secondary | ICD-10-CM

## 2017-07-02 DIAGNOSIS — Z79899 Other long term (current) drug therapy: Secondary | ICD-10-CM | POA: Insufficient documentation

## 2017-07-02 DIAGNOSIS — J449 Chronic obstructive pulmonary disease, unspecified: Secondary | ICD-10-CM | POA: Insufficient documentation

## 2017-07-02 DIAGNOSIS — F17210 Nicotine dependence, cigarettes, uncomplicated: Secondary | ICD-10-CM | POA: Diagnosis not present

## 2017-07-02 DIAGNOSIS — F1910 Other psychoactive substance abuse, uncomplicated: Secondary | ICD-10-CM | POA: Diagnosis not present

## 2017-07-02 DIAGNOSIS — N521 Erectile dysfunction due to diseases classified elsewhere: Secondary | ICD-10-CM

## 2017-07-02 DIAGNOSIS — F1010 Alcohol abuse, uncomplicated: Secondary | ICD-10-CM | POA: Diagnosis not present

## 2017-07-02 DIAGNOSIS — I4729 Other ventricular tachycardia: Secondary | ICD-10-CM

## 2017-07-02 DIAGNOSIS — I428 Other cardiomyopathies: Secondary | ICD-10-CM

## 2017-07-02 DIAGNOSIS — I429 Cardiomyopathy, unspecified: Secondary | ICD-10-CM | POA: Diagnosis not present

## 2017-07-02 DIAGNOSIS — F17200 Nicotine dependence, unspecified, uncomplicated: Secondary | ICD-10-CM

## 2017-07-02 DIAGNOSIS — Z888 Allergy status to other drugs, medicaments and biological substances status: Secondary | ICD-10-CM | POA: Insufficient documentation

## 2017-07-02 DIAGNOSIS — Z95810 Presence of automatic (implantable) cardiac defibrillator: Secondary | ICD-10-CM | POA: Diagnosis not present

## 2017-07-02 DIAGNOSIS — Z8249 Family history of ischemic heart disease and other diseases of the circulatory system: Secondary | ICD-10-CM | POA: Insufficient documentation

## 2017-07-02 DIAGNOSIS — Z720 Tobacco use: Secondary | ICD-10-CM

## 2017-07-02 LAB — BASIC METABOLIC PANEL
Anion gap: 10 (ref 5–15)
BUN: 16 mg/dL (ref 6–20)
CALCIUM: 9.7 mg/dL (ref 8.9–10.3)
CO2: 24 mmol/L (ref 22–32)
CREATININE: 1.06 mg/dL (ref 0.61–1.24)
Chloride: 101 mmol/L (ref 101–111)
GFR calc Af Amer: >60 mL/min (ref >60)
GLUCOSE: 92 mg/dL (ref 65–99)
Potassium: 4.7 mmol/L (ref 3.5–5.1)
Sodium: 135 mmol/L (ref 135–145)

## 2017-07-02 LAB — MAGNESIUM: Magnesium: 2.2 mg/dL (ref 1.7–2.4)

## 2017-07-02 LAB — BRAIN NATRIURETIC PEPTIDE: B NATRIURETIC PEPTIDE 5: 532.9 pg/mL — AB (ref 0.0–100.0)

## 2017-07-02 MED ORDER — LOSARTAN POTASSIUM 25 MG PO TABS: 12.5000 mg | ORAL_TABLET | Freq: Every day | ORAL | 2 refills | Status: DC

## 2017-07-02 MED ORDER — NICOTINE 14 MG/24HR TD PT24: 14.0000 mg | MEDICATED_PATCH | Freq: Every day | TRANSDERMAL | 0 refills | Status: DC

## 2017-07-02 MED ORDER — METOPROLOL SUCCINATE ER 25 MG PO TB24: 50.0000 mg | ORAL_TABLET | Freq: Every day | ORAL | 2 refills | Status: DC

## 2017-07-02 MED ORDER — SPIRONOLACTONE 25 MG PO TABS: 25.0000 mg | ORAL_TABLET | Freq: Every day | ORAL | 2 refills | Status: DC

## 2017-07-02 MED FILL — LOSARTAN POTASSIUM 25 MG TA: 25 | 34 days supply | Qty: 17 | Fill #0

## 2017-07-02 MED FILL — METOPROLOL SUCCINATE ER 25: 25 | 34 days supply | Qty: 34 | Fill #0

## 2017-07-02 MED FILL — FUROSEMIDE 40 MG TAB: 40 | 50 days supply | Qty: 100 | Fill #0

## 2017-07-02 MED FILL — SPIRONOLACTONE 25 MG TABLET: 25 | 34 days supply | Qty: 34 | Fill #0

## 2017-07-02 NOTE — Patient Instructions (Addendum)
STOP Lisinopril.  STOP Metoprolol Tartrate (Lopressor)  START Losartan 12.5 mg (1/2 tab) once daily. Start tomorrow.  START Metoprolol Succinate (Toprol XL) 25 mg tablet once daily.  Continue Spironolactone 25 mg tablet once daily.  START Nicotine patches once daily as directed on box.  Prescriptions have been sent to [facility] under the [facility]. Address: [address] [place], [place] [address]  Phone: [phone] Located next to [facility].  Routine lab work today. Will notify you of abnormal results, otherwise no news is good news!  Will schedule you for Cardiopulmonary Exercise Test. This test is done at our [facility]. Please wear comfortable clothes and shoes for this test. Avoid heavy meal before the test (light snack/meal recommended). Avoid caffeine, alcohol, tobacco products 12 hrs before test. Please give 24 hr notice for cancellations/rescheduling: [phone].  Follow up 4 weeks with Otilio Saber PA-C.  Take all medication as prescribed the day of your appointment. Bring all medications with you to your appointment.  Do the following things EVERYDAY: 1) Weigh yourself in the morning before breakfast. Write it down and keep it in a log. 2) Take your medicines as prescribed 3) Eat low salt foods-Limit salt (sodium) to 2000 mg per day.  4) Stay as active as you can everyday 5) Limit all fluids for the day to less than 2 liters

## 2017-07-02 NOTE — Progress Notes (Signed)
Advanced Heart Failure Clinic Note   Referring Physician: PCP: No primary care provider on file. PCP-Cardiologist: Garwin Brothers, MD  [facility], requesting transfer  HPI:  Carl Ferguson is a 52 y.o. 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   Pt has long complicated history of CHF. Previously had EF down to 30% in 2011, which improved to 50% on medical therapy.  He had ICD/?CRT for Systolic CHF and LBBB. He had problems with the leads of his first ICD, so has a SubQ ICD placed. This became infected so all equipment was removed.  He had a second ICD placed, which was also subsequently removed due to infection.   Admitted 2/28 - 06/17/17 with SOB, orthopnea, and PND. He was off medicines for about 6 weeks prior due to insurance issues.  Repeat Echo significant for fall in EF to 25%, from previously 50% in 2017 at [facility].   Followed up with Dr. Tomie China 06/24/17. Feeling better at this appointment. Referred for LHC and HF team.   LHC 06/26/17 as below with NICM and elevated LVEDP.  Lasix increased.  He presents today to establish in the HF clinic. He is feeling overall better back on medications and with diuresis. His BP has been running in low 100s over the weekend, but he denies lightheadedness or dizziness. 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  For the last 4-6 weeks, he has been SOB with walking up an incline, and occasional with changing clothes/bathing. He smokes 3-5 cigarettes day, down from 1 ppd and drinks 2-3 beers a day, down from 5-6. He states he has used cocaine as recently as January, but is off.   Echo 06/14/17 LVEF 15-20%, diffuse HK, Mod/Sev MR, Sev LAE, Mild TR, PA peak pressure 45 mm Hg, Normal RV.  LHC 06/26/17  - Widely patent coronary  arteries without significant obstructive disease.  - Moderately elevated LVEDP - Known severe LV systolic dysfunction.   Review of Systems: [y] = yes, [ ]  = no   General: Weight gain [ ] ; Weight loss [ ] ; Anorexia [ ] ; Fatigue [y]; Fever [ ] ; Chills [ ] ; Weakness [y]  Cardiac: Chest pain/pressure [ ] ; Resting SOB [ ] ; Exertional SOB [y]; Orthopnea [ ] ; Pedal Edema [y]; Palpitations [ ] ; Syncope [ ] ; Presyncope [ ] ; Paroxysmal nocturnal dyspnea[ ]   Pulmonary: Cough [ ] ; Wheezing[ ] ; Hemoptysis[ ] ; Sputum [ ] ; Snoring [ ]   GI: Vomiting[ ] ; Dysphagia[ ] ; Melena[ ] ; Hematochezia [ ] ; Heartburn[ ] ; Abdominal pain [ ] ; Constipation [ ] ; Diarrhea [ ] ; BRBPR [ ]   GU: Hematuria[ ] ; Dysuria [ ] ; Nocturia[ ]   Vascular: Pain in legs with walking [ ] ; Pain in feet with lying flat [ ] ; Non-healing sores [ ] ; Stroke [ ] ; TIA [ ] ; Slurred speech [ ] ;  Neuro: Headaches[ ] ; Vertigo[ ] ; Seizures[ ] ; Paresthesias[ ] ;Blurred vision [ ] ; Diplopia [ ] ; Vision changes [ ]   Ortho/Skin: Arthritis [y]; Joint pain [y]; Muscle pain [ ] ; Joint swelling [ ] ; Back Pain [ ] ; Rash [ ]   Psych: Depression[ ] ; Anxiety[ ]   Heme: Bleeding problems [ ] ; Clotting disorders [ ] ; Anemia [ ]   Endocrine: Diabetes [ ] ; Thyroid dysfunction[ ]   Past Medical History:  Diagnosis Date  . CHF (  congestive heart failure) (HCC)   . COPD (chronic obstructive pulmonary disease) (HCC)   . Nonischemic cardiomyopathy (HCC)    a. diagnosed in 2011 with EF 30% at that time b. EF improved to 50% by echo in 02/2016   Current Outpatient Medications  Medication Sig Dispense Refill  . aspirin EC 81 MG tablet Take 81 mg by mouth daily.    . furosemide (LASIX) 40 MG tablet Take 1 tablet (40 mg total) by mouth 2 (two) times daily. 60 tablet 5  . omeprazole (PRILOSEC) 20 MG capsule Take 20 mg by mouth daily.    . potassium chloride SA (K-DUR,KLOR-CON) 20 MEQ tablet Take 20 mEq by mouth daily.    Marland Kitchen spironolactone (ALDACTONE) 25 MG tablet Take 1 tablet (25  mg total) by mouth daily. 30 tablet 2  . losartan (COZAAR) 25 MG tablet Take 0.5 tablets (12.5 mg total) by mouth daily. 15 tablet 2  . metoprolol succinate (TOPROL-XL) 25 MG 24 hr tablet Take 2 tablets (50 mg total) by mouth daily. Take with or immediately following a meal. 30 tablet 2  . nicotine (NICODERM CQ - DOSED IN MG/24 HOURS) 14 mg/24hr patch Place 1 patch (14 mg total) onto the skin daily. 28 patch 0   No current facility-administered medications for this encounter.    Allergies  Allergen Reactions  . Norflex [Orphenadrine] Swelling  . Orphenadrine Citrate Swelling    Face swelling    Social History   Socioeconomic History  . Marital status: Single    Spouse name: Not on file  . Number of children: Not on file  . Years of education: Not on file  . Highest education level: Not on file  Social Needs  . Financial resource strain: Not on file  . Food insecurity - worry: Not on file  . Food insecurity - inability: Not on file  . Transportation needs - medical: Not on file  . Transportation needs - non-medical: Not on file  Occupational History  . Not on file  Tobacco Use  . Smoking status: Current Some Day Smoker  . Smokeless tobacco: Never Used  Substance and Sexual Activity  . Alcohol use: Yes    Frequency: Never    Comment: 3-4 beers daily  . Drug use: No  . Sexual activity: Not on file  Other Topics Concern  . Not on file  Social History Narrative  . Not on file   Family History  Problem Relation Age of Onset  . Heart attack Mother   . Sudden Cardiac Death Neg Hx    Vitals:   03-Jul-2017 1430  BP: 98/86  Pulse: (!) 101  SpO2: 98%  Weight: 151 lb 12.8 oz (68.9 kg)  Height: 5\' 7"  (1.702 m)    Wt Readings from Last 3 Encounters:  Jul 03, 2017 151 lb 12.8 oz (68.9 kg)  06/26/17 154 lb (69.9 kg)  06/24/17 153 lb 12.8 oz (69.8 kg)   PHYSICAL EXAM: General:  Well appearing. No respiratory difficulty HEENT: normal Neck: supple. JVP 5-6 cm. Carotids 2+ bilat;  no bruits. No lymphadenopathy or thyromegaly appreciated. Cor: PMI nondisplaced. Regular rate & rhythm. +S3.  Lungs: clear Abdomen: soft, nontender, nondistended. No hepatosplenomegaly. No bruits or masses. Good bowel sounds. Extremities: no cyanosis, clubbing, rash, or edema Neuro: alert & oriented x 3, cranial nerves grossly intact. moves all 4 extremities w/o difficulty. Affect pleasant.  ECG: Sinus Tach 103 bpm, QRS 154 ms, personally reviewed  ASSESSMENT & PLAN:  1. Chronic Systolic CHF, NICM -  Cath 06/2017 with NICM. - Echo 06/2017 with LVEF 15-20%.   (Previously 50% in 2017, which had improved from 30% in 2011) - NYHA III-IIIb. +S3 on exam.  - Volume status stable to dry.  - Continue lasix 40 mg BID, but had long discussion about sliding scale diuretics. Suspect he will land on a dose lower than this.  - Stop lisinopril. Change to losartan 12.5 mg qhs with Change lisinopril 12.5 mg daily.  - Should be taking toprol-XL 25 mg daily, not Lopressor.  - Continue spiro 25 mg daily for now.  - HIV negative 06/2017 - Will order CPX.  2. HTN - Meds as above in setting of treating his HF 3. Chronic LBBB - QRS 154.  - He is poor candidate for ICD, as he has infected at least 2, by his history, possibly 3.  - Will need EP follow up.  4. ETOH Abuse - Encouraged complete cessation.  5. Substance Abuse - Encouraged complete cessation. Will need 6 months free if considered for transplant 6. Tobacco abuse - Encouraged complete cessation. Spoke for > 5 minutes concerning tobacco cessation - Will give nicotine patches 14 mg BID.  7. H/o ICD s/p multiple extractions - See Chronic LBBB discussion above.  8. ED - BP too low at this time for viagra.  9. Social - His insurance lapsed, per patient it was the "company's fault and they admitted it."  Will provide cardiac medications through the HF fund for now.   Had long discussion today about HF clinic goals and plan moving forward. Pt aware  that with his complicated history, he may eventually require consideration for advanced therapies including transplant and VAD. There are several barriers to transplant currently, as discussed above.  Will plan CPX. He is relatively active, or would consider RHC. Suspect he will have some improvement back on medications, but concerning for his long term prognosis  RTC 4 weeks. Will ask MD to see as well at that visit.   Graciella Freer, PA-C 07/02/17   Greater than 50% of the 60 minute visit was spent in counseling/coordination of care regarding disease state education, salt/fluid restriction, sliding scale diuretics, advanced therapy consideration, and medication compliance.

## 2017-07-08 ENCOUNTER — Ambulatory Visit (HOSPITAL_COMMUNITY): Payer: Medicaid Other | Attending: Cardiovascular Disease

## 2017-07-08 ENCOUNTER — Other Ambulatory Visit (HOSPITAL_COMMUNITY): Payer: Self-pay | Admitting: *Deleted

## 2017-07-08 DIAGNOSIS — I5022 Chronic systolic (congestive) heart failure: Secondary | ICD-10-CM | POA: Diagnosis not present

## 2017-07-25 ENCOUNTER — Ambulatory Visit (INDEPENDENT_AMBULATORY_CARE_PROVIDER_SITE_OTHER): Payer: Self-pay | Admitting: Cardiology

## 2017-07-25 ENCOUNTER — Encounter: Payer: Self-pay | Admitting: Cardiology

## 2017-07-25 VITALS — BP 118/72 | HR 98 | Ht 67.0 in | Wt 155.1 lb

## 2017-07-25 DIAGNOSIS — I10 Essential (primary) hypertension: Secondary | ICD-10-CM

## 2017-07-25 DIAGNOSIS — I428 Other cardiomyopathies: Secondary | ICD-10-CM

## 2017-07-25 DIAGNOSIS — I4729 Other ventricular tachycardia: Secondary | ICD-10-CM

## 2017-07-25 DIAGNOSIS — I5022 Chronic systolic (congestive) heart failure: Secondary | ICD-10-CM

## 2017-07-25 DIAGNOSIS — I447 Left bundle-branch block, unspecified: Secondary | ICD-10-CM

## 2017-07-25 DIAGNOSIS — I420 Dilated cardiomyopathy: Secondary | ICD-10-CM

## 2017-07-25 DIAGNOSIS — I472 Ventricular tachycardia: Secondary | ICD-10-CM

## 2017-07-25 MED ORDER — METOPROLOL SUCCINATE ER 50 MG PO TB24: 50.0000 mg | ORAL_TABLET | Freq: Every day | ORAL | 2 refills | Status: DC

## 2017-07-25 NOTE — Patient Instructions (Signed)
Medication Instructions:  Your physician has recommended you make the following change in your medication:  CHANGE metoprolol succinate 50 mg daily  Labwork: Your physician recommends that you have the following labs drawn: BMP today  Testing/Procedures: None  Follow-Up: Your physician recommends that you schedule a follow-up appointment in: 4 months  Any Other Special Instructions Will Be Listed Below (If Applicable).     If you need a refill on your cardiac medications before your next appointment, please call your pharmacy.   [facility]  Garey Ham, RN, BSN

## 2017-07-25 NOTE — Progress Notes (Signed)
Cardiology Office Note:    Date:  07/25/2017   ID:  Carl Ferguson, DOB 02-Oct-1965, MRN [id number]  PCP:  Simone Curia, MD  Cardiologist:  Garwin Brothers, MD   Referring MD: Eunice Blase, PA-C    ASSESSMENT:    1. Chronic systolic heart failure (HCC)   2. Dilated cardiomyopathy (HCC)   3. Essential hypertension   4. LBBB (left bundle branch block)   5. Nonischemic cardiomyopathy (HCC)   6. Nonsustained ventricular tachycardia (HCC)    PLAN:    In order of problems listed above:  1. Congestive heart failure education was given.  I advised the patient to be very cautious with his diet and weigh himself on a regular basis.  He uses furosemide but titrates the dose according to his needs.  He is trying to be very meticulous with his diet and his salt intake.  He also has quit smoking completely since my last evaluation.  In view of the multiple medications that he is unable to do a Chem-7.  He has a appointment coming up with congestive heart failure clinic in the next few days.  He tells me that he is being considered for possible transplant to be on the list for heart transplant.  He been be seen in follow-up appointment in 4 months or earlier if he has any concerns.  I have also doubled his metoprolol to 50 mg daily.  We will Track of his pulse and blood pressure and get it to the clinic when he sees his advanced heart failure doctors.  Medication Adjustments/Labs and Tests Ordered: Current medicines are reviewed at length with the patient today.  Concerns regarding medicines are outlined above.  No orders of the defined types were placed in this encounter.  No orders of the defined types were placed in this encounter.    Chief Complaint  Patient presents with  . Follow-up  . Tachycardia  . Hypertension     History of Present Illness:    Carl Ferguson is a 52 y.o. male.  Patient was evaluated by me for advanced cardiomyopathy.  He has had a defibrillator extracted in  the past for infection.  He subsequently was treated for congestive heart failure.  He is in the advanced congestive heart failure program.  He denies any chest pain orthopnea or PND.  He seems to be stable at low level with his congestive heart failure.  Fortunately he has quit smoking.  At the time of my evaluation, the patient is alert awake oriented and in no distress.  Past Medical History:  Diagnosis Date  . CHF (congestive heart failure) (HCC)   . COPD (chronic obstructive pulmonary disease) (HCC)   . Nonischemic cardiomyopathy (HCC)    a. diagnosed in 2011 with EF 30% at that time b. EF improved to 50% by echo in 02/2016    Past Surgical History:  Procedure Laterality Date  . LEFT HEART CATH AND CORONARY ANGIOGRAPHY N/A 06/26/2017   Procedure: LEFT HEART CATH AND CORONARY ANGIOGRAPHY;  Surgeon: [name], MD;  Location: [facility] INVASIVE CV LAB;  Service: Cardiovascular;  Laterality: N/A;    Current Medications: Current Meds  Medication Sig  . aspirin EC 81 MG tablet Take 81 mg by mouth daily.  . furosemide (LASIX) 40 MG tablet Take 1 tablet (40 mg total) by mouth 2 (two) times daily.  Marland Kitchen losartan (COZAAR) 25 MG tablet Take 0.5 tablets (12.5 mg total) by mouth daily.  . metoprolol succinate (TOPROL-XL) 25  MG 24 hr tablet Take 2 tablets (50 mg total) by mouth daily. Take with or immediately following a meal.  . omeprazole (PRILOSEC) 20 MG capsule Take 20 mg by mouth daily.  . potassium chloride SA (K-DUR,KLOR-CON) 20 MEQ tablet Take 20 mEq by mouth daily.  Marland Kitchen spironolactone (ALDACTONE) 25 MG tablet Take 1 tablet (25 mg total) by mouth daily.  . [DISCONTINUED] nicotine (NICODERM CQ - DOSED IN MG/24 HOURS) 14 mg/24hr patch Place 1 patch (14 mg total) onto the skin daily.     Allergies:   Norflex [orphenadrine] and Orphenadrine citrate   Social History   Socioeconomic History  . Marital status: Single    Spouse name: Not on file  . Number of children: Not on file  . Years of  education: Not on file  . Highest education level: Not on file  Occupational History  . Not on file  Social Needs  . Financial resource strain: Not on file  . Food insecurity:    Worry: Not on file    Inability: Not on file  . Transportation needs:    Medical: Not on file    Non-medical: Not on file  Tobacco Use  . Smoking status: Current Some Day Smoker  . Smokeless tobacco: Never Used  Substance and Sexual Activity  . Alcohol use: Yes    Frequency: Never    Comment: 3-4 beers daily  . Drug use: No  . Sexual activity: Not on file  Lifestyle  . Physical activity:    Days per week: Not on file    Minutes per session: Not on file  . Stress: Not on file  Relationships  . Social connections:    Talks on phone: Not on file    Gets together: Not on file    Attends religious service: Not on file    Active member of club or organization: Not on file    Attends meetings of clubs or organizations: Not on file    Relationship status: Not on file  Other Topics Concern  . Not on file  Social History Narrative  . Not on file     Family History: The patient's family history includes Heart attack in his mother. There is no history of Sudden Cardiac Death.  ROS:   Please see the history of present illness.    All other systems reviewed and are negative.  EKGs/Labs/Other Studies Reviewed:    The following studies were reviewed today: I reviewed records from my colleagues and their evaluation of this gentleman including a heart catheterization and stress test and discussed with him at length.   Recent Labs: 06/24/2017: Hemoglobin 16.4; Platelets 298 07/02/2017: B Natriuretic Peptide 532.9; BUN 16; Creatinine, Ser 1.06; Magnesium 2.2; Potassium 4.7; Sodium 135  Recent Lipid Panel No results found for: CHOL, TRIG, HDL, CHOLHDL, VLDL, LDLCALC, LDLDIRECT  Physical Exam:    VS:  BP 118/72 (BP Location: Left Arm, Patient Position: Sitting, Cuff Size: Normal)   Pulse 98   Ht 5\' 7"   (1.702 m)   Wt 155 lb 1.9 oz (70.4 kg)   SpO2 99%   BMI 24.30 kg/m     Wt Readings from Last 3 Encounters:  07/25/17 155 lb 1.9 oz (70.4 kg)  07/02/17 151 lb 12.8 oz (68.9 kg)  06/26/17 154 lb (69.9 kg)     GEN: Patient is in no acute distress HEENT: Normal NECK: No JVD; No carotid bruits LYMPHATICS: No lymphadenopathy CARDIAC: Hear sounds regular, 2/6 systolic murmur at the  apex. RESPIRATORY:  Clear to auscultation without rales, wheezing or rhonchi  ABDOMEN: Soft, non-tender, non-distended MUSCULOSKELETAL:  No edema; No deformity  SKIN: Warm and dry NEUROLOGIC:  Alert and oriented x 3 PSYCHIATRIC:  Normal affect   Signed, Garwin Brothers, MD  07/25/2017 3:28 PM    [facility]

## 2017-07-26 LAB — BASIC METABOLIC PANEL
BUN/Creatinine Ratio: 13 (ref 9–20)
BUN: 14 mg/dL (ref 6–24)
CALCIUM: 10 mg/dL (ref 8.7–10.2)
CO2: 24 mmol/L (ref 20–29)
CREATININE: 1.06 mg/dL (ref 0.76–1.27)
Chloride: 98 mmol/L (ref 96–106)
GFR calc Af Amer: 94 mL/min/{1.73_m2} (ref >59)
GFR, EST NON AFRICAN AMERICAN: 81 mL/min/{1.73_m2} (ref >59)
Glucose: 95 mg/dL (ref 65–99)
Potassium: 4.4 mmol/L (ref 3.5–5.2)
Sodium: 138 mmol/L (ref 134–144)

## 2017-07-26 LAB — SPECIMEN STATUS REPORT

## 2017-07-29 NOTE — Progress Notes (Addendum)
Advanced Heart Failure Clinic Note   Referring Physician: PCP: Simone Curia, MD PCP-Cardiologist: Garwin Brothers, MD  [facility], requesting transfer  HPI:  Carl Ferguson is a 52 y.o. 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   Pt has long complicated history of CHF. Previously had EF down to 30% in 2011, which improved to 50% on medical therapy.  He had ICD/?CRT for Systolic CHF and LBBB. He had problems with the leads of his first ICD, so has a SubQ ICD placed. This became infected so all equipment was removed.  He had a second ICD placed, which was also subsequently removed due to infection.   Admitted 2/28 - 06/17/17 with SOB, orthopnea, and PND. He was off medicines for about 6 weeks prior due to insurance issues.  Repeat Echo significant for fall in EF to 25%, from previously 50% in 2017 at [facility].   Followed up with Dr. Tomie China 06/24/17. Feeling better at this appointment. Referred for LHC and HF team.   LHC 06/26/17 as below with NICM and elevated LVEDP.  Lasix increased.  He presents today for regular follow up. CPX ordered at last visit. Showed mild to moderate HF limitation with signifiacntly elevated VE/VCO2 slope and blunted BP response. Toprol increase by [facility] last week. He has been feeling Ok. Main complaint remains fatigue. 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  He has been on nicotine patches and hasn't smoke in 2 weeks. Has also cut back on ETOH. No drug use. He is able to get around the house OK, but remains SOB with mild to moderate exertion. At times he is SOB with bathing and changing clothes.   Echo 06/14/17 LVEF 15-20%, diffuse HK, Mod/Sev MR, Sev LAE, Mild TR, PA peak pressure 45 mm Hg, Normal RV.  LHC 06/26/17  - Widely patent coronary arteries without significant obstructive disease.  - Moderately  elevated LVEDP - Known severe LV systolic dysfunction.   CPX 07/08/17 FVC 3.94 (104%)    FEV1 3.25 (107%)     FEV1/FVC 82 (102%)     MVV 109 (76%)  Exercise Time:  15:15  Speed (mph): 3.0    Grade (%): 12.5  RPE: 15  Reason stopped: patient ended test due to being lightheaded (8/10) Additional symptoms: Dyspnea (7/10) Resting HR: 105 Peak HR: 140  (82% age predicted max HR) BP rest: 92/80 BP peak: 108/78 Peak VO2: 24.3 (68% predicted peak VO2) VE/VCO2 slope: 37 OUES: 1.79 Peak RER: 0.91 Ventilatory Threshold: 20.4 (57% predicted and 84% measured peak VO2) Peak RR 38 Peak Ventilation: 58.7 VE/MVV: 54% PETCO2 at peak: 29 O2pulse: 12  (86% predicted O2pulse)  Review of systems complete and found to be negative unless listed in HPI.    Past Medical History:  Diagnosis Date  . CHF (congestive heart failure) (HCC)   . COPD (chronic obstructive pulmonary disease) (HCC)   . Nonischemic cardiomyopathy (HCC)    a. diagnosed in 2011 with EF 30% at that time b. EF improved to 50% by echo in 02/2016   Current Outpatient Medications  Medication Sig Dispense Refill  . aspirin EC 81 MG tablet Take 81 mg by mouth daily.    . furosemide (LASIX) 40 MG tablet Take 1 tablet (40 mg total) by mouth 2 (two) times daily. 60 tablet 5  . losartan (COZAAR) 25 MG  tablet Take 0.5 tablets (12.5 mg total) by mouth daily. 15 tablet 2  . metoprolol succinate (TOPROL-XL) 50 MG 24 hr tablet Take 1 tablet (50 mg total) by mouth daily. Take with or immediately following a meal. 90 tablet 2  . omeprazole (PRILOSEC) 20 MG capsule Take 20 mg by mouth daily.    . potassium chloride SA (K-DUR,KLOR-CON) 20 MEQ tablet Take 20 mEq by mouth daily.    Marland Kitchen spironolactone (ALDACTONE) 25 MG tablet Take 1 tablet (25 mg total) by mouth daily. 30 tablet 2   No current facility-administered medications for this visit.    Allergies  Allergen Reactions  . Norflex [Orphenadrine] Swelling  . Orphenadrine  Citrate Swelling    Face swelling    Social History   Socioeconomic History  . Marital status: Single    Spouse name: Not on file  . Number of children: Not on file  . Years of education: Not on file  . Highest education level: Not on file  Occupational History  . Not on file  Social Needs  . Financial resource strain: Not on file  . Food insecurity:    Worry: Not on file    Inability: Not on file  . Transportation needs:    Medical: Not on file    Non-medical: Not on file  Tobacco Use  . Smoking status: Current Some Day Smoker  . Smokeless tobacco: Never Used  Substance and Sexual Activity  . Alcohol use: Yes    Frequency: Never    Comment: 3-4 beers daily  . Drug use: No  . Sexual activity: Not on file  Lifestyle  . Physical activity:    Days per week: Not on file    Minutes per session: Not on file  . Stress: Not on file  Relationships  . Social connections:    Talks on phone: Not on file    Gets together: Not on file    Attends religious service: Not on file    Active member of club or organization: Not on file    Attends meetings of clubs or organizations: Not on file    Relationship status: Not on file  . Intimate partner violence:    Fear of current or ex partner: Not on file    Emotionally abused: Not on file    Physically abused: Not on file    Forced sexual activity: Not on file  Other Topics Concern  . Not on file  Social History Narrative  . Not on file   Family History  Problem Relation Age of Onset  . Heart attack Mother   . Sudden Cardiac Death Neg Hx    Vitals:   2017-08-05 0851  BP: 124/78  Pulse: (!) 103  SpO2: 97%  Weight: 157 lb 6.4 oz (71.4 kg)    Wt Readings from Last 3 Encounters:  08-05-2017 157 lb 6.4 oz (71.4 kg)  07/25/17 155 lb 1.9 oz (70.4 kg)  07/02/17 151 lb 12.8 oz (68.9 kg)   PHYSICAL EXAM: General: Well appearing. No resp difficulty. HEENT: Normal Neck: Supple. JVP 6-7 cm. Carotids 2+ bilat; no bruits. No  thyromegaly or nodule noted. Cor: PMI nondisplaced. RRR, No M/G/R noted. ? S3 Lungs: CTAB, normal effort. Abdomen: Soft, non-tender, non-distended, no HSM. No bruits or masses. +BS  Extremities: No cyanosis, clubbing, or rash. R and LLE no edema.  Neuro: Alert & orientedx3, cranial nerves grossly intact. moves all 4 extremities w/o difficulty. Affect pleasant   ASSESSMENT & PLAN:  1. Chronic Systolic CHF, NICM - Cath 06/2017 with NICM. - Echo 06/2017 with LVEF 15-20%.  (Previously 50% in 2017, which had improved from 30% in 2011) - CPX 07/2017 with mild/Mod HF limitation. VE/VCO2 slope: 37 and blunted BP response. - NYHA III. (From his descriptions he is early IIIa, but more limited from fatigue than SOB) - Volume status stable. Plan repeat Echo June/July.  - Decrease lasix to 40 mg daily.  - Stop losartan. Switch to Entresto 24/26 mg BID.  BMET last week stable. Recheck next week with change. - Toprol XL increase to 50 mg daily last week. Will be cautious with some concerns for low output.  - Continue spiro 25 mg daily - HIV negative 06/2017 - CPX 06/2017 with mild to mod HF limitation and elevated slope/blunted BP response. Consider repeat 6 months.  2. HTN - Meds as above.  3. Chronic LBBB - QRS 154.  - He is poor candidate for ICD, as he has infected at least 2, by his history, possibly 3.  - Will need EP follow up if EF does not improve.  4. ETOH Abuse - Encouraged complete cessation.  5. Substance Abuse - Encouraged complete cessation. Will need 6 months free if considered for transplant 6. Tobacco abuse - Encouraged complete cessation.  - Continue nicotine patches.  7. H/o ICD s/p multiple extractions - See Chronic LBBB discussion above. 8. ED - OK to try low dose Viagra.  - Will give #10 50 mg tablets and have him take 25 mg to effect. OK to use 50 mg if needed.  9. Social - His insurance lapsed, per patient it was the "company's fault and they admitted it."   - Will  continue to provide cardiac medications through the HF fund for now.   Graciella Freer, PA-C 07/29/17   Greater than 50% of the 25 minute visit was spent in counseling/coordination of care regarding disease state education, salt/fluid restriction, sliding scale diuretics, and medication compliance.

## 2017-07-30 ENCOUNTER — Encounter (HOSPITAL_COMMUNITY): Payer: Self-pay | Admitting: *Deleted

## 2017-07-30 ENCOUNTER — Ambulatory Visit (HOSPITAL_COMMUNITY)
Admission: RE | Admit: 2017-07-30 | Discharge: 2017-07-30 | Disposition: A | Discharge status: Home / Self Care | Payer: Self-pay | Source: Ambulatory Visit | Attending: Cardiology | Admitting: Cardiology

## 2017-07-30 ENCOUNTER — Encounter (HOSPITAL_COMMUNITY): Payer: Self-pay

## 2017-07-30 VITALS — BP 124/78 | HR 103 | Wt 157.4 lb

## 2017-07-30 DIAGNOSIS — I5022 Chronic systolic (congestive) heart failure: Secondary | ICD-10-CM

## 2017-07-30 DIAGNOSIS — N529 Male erectile dysfunction, unspecified: Secondary | ICD-10-CM | POA: Insufficient documentation

## 2017-07-30 DIAGNOSIS — Z888 Allergy status to other drugs, medicaments and biological substances status: Secondary | ICD-10-CM | POA: Insufficient documentation

## 2017-07-30 DIAGNOSIS — I110 Hypertensive heart disease with heart failure: Secondary | ICD-10-CM | POA: Insufficient documentation

## 2017-07-30 DIAGNOSIS — F1910 Other psychoactive substance abuse, uncomplicated: Secondary | ICD-10-CM | POA: Insufficient documentation

## 2017-07-30 DIAGNOSIS — N521 Erectile dysfunction due to diseases classified elsewhere: Secondary | ICD-10-CM

## 2017-07-30 DIAGNOSIS — I447 Left bundle-branch block, unspecified: Secondary | ICD-10-CM

## 2017-07-30 DIAGNOSIS — F1010 Alcohol abuse, uncomplicated: Secondary | ICD-10-CM

## 2017-07-30 DIAGNOSIS — Z8249 Family history of ischemic heart disease and other diseases of the circulatory system: Secondary | ICD-10-CM | POA: Insufficient documentation

## 2017-07-30 DIAGNOSIS — I428 Other cardiomyopathies: Secondary | ICD-10-CM

## 2017-07-30 DIAGNOSIS — Z79899 Other long term (current) drug therapy: Secondary | ICD-10-CM | POA: Insufficient documentation

## 2017-07-30 DIAGNOSIS — I10 Essential (primary) hypertension: Secondary | ICD-10-CM

## 2017-07-30 DIAGNOSIS — Z7982 Long term (current) use of aspirin: Secondary | ICD-10-CM | POA: Insufficient documentation

## 2017-07-30 DIAGNOSIS — J449 Chronic obstructive pulmonary disease, unspecified: Secondary | ICD-10-CM | POA: Insufficient documentation

## 2017-07-30 DIAGNOSIS — F17200 Nicotine dependence, unspecified, uncomplicated: Secondary | ICD-10-CM | POA: Insufficient documentation

## 2017-07-30 DIAGNOSIS — Z720 Tobacco use: Secondary | ICD-10-CM

## 2017-07-30 MED ORDER — METOPROLOL SUCCINATE ER 50 MG PO TB24: 50.0000 mg | ORAL_TABLET | Freq: Every day | ORAL | 3 refills | Status: DC

## 2017-07-30 MED ORDER — SILDENAFIL CITRATE 50 MG PO TABS: 25.0000 mg | ORAL_TABLET | ORAL | 0 refills | Status: DC | PRN

## 2017-07-30 MED ORDER — SACUBITRIL-VALSARTAN 24-26 MG PO TABS: 1.0000 | ORAL_TABLET | Freq: Two times a day (BID) | ORAL | 3 refills | Status: DC

## 2017-07-30 MED ORDER — FUROSEMIDE 40 MG PO TABS: 40.0000 mg | ORAL_TABLET | Freq: Every day | ORAL | 3 refills | Status: DC

## 2017-07-30 MED FILL — ENTRESTO 24 MG-26 MG TABLET: 24-26 | 30 days supply | Qty: 60 | Fill #0

## 2017-07-30 MED FILL — METOPROLOL SUCCINATE ER 50: 50 | 30 days supply | Qty: 30 | Fill #0

## 2017-07-30 NOTE — Patient Instructions (Signed)
STOP Losartan CHANGE Metoprolol 50 mg,  To one tab daily at bedtime START Entresto 24/26 mg, one tab twice a day DECREASE Lasix to 40 mg, one tab daily  Labs needed in 7-10 days  Your physician recommends that you schedule a follow-up appointment in: 4 weeks with Joanell Rising   Do the following things EVERYDAY: 1) Weigh yourself in the morning before breakfast. Write it down and keep it in a log. 2) Take your medicines as prescribed 3) Eat low salt foods-Limit salt (sodium) to 2000 mg per day.  4) Stay as active as you can everyday 5) Limit all fluids for the day to less than 2 liters

## 2017-08-07 ENCOUNTER — Ambulatory Visit (HOSPITAL_COMMUNITY)
Admission: RE | Admit: 2017-08-07 | Discharge: 2017-08-07 | Disposition: A | Discharge status: Home / Self Care | Payer: Medicaid Other | Source: Ambulatory Visit | Attending: Internal Medicine | Admitting: Internal Medicine

## 2017-08-07 DIAGNOSIS — I5022 Chronic systolic (congestive) heart failure: Secondary | ICD-10-CM | POA: Diagnosis not present

## 2017-08-07 LAB — BASIC METABOLIC PANEL
Anion gap: 11 (ref 5–15)
BUN: 19 mg/dL (ref 6–20)
CHLORIDE: 98 mmol/L — AB (ref 101–111)
CO2: 22 mmol/L (ref 22–32)
CREATININE: 1.15 mg/dL (ref 0.61–1.24)
Calcium: 9.6 mg/dL (ref 8.9–10.3)
GFR calc Af Amer: >60 mL/min (ref >60)
GFR calc non Af Amer: >60 mL/min (ref >60)
GLUCOSE: 90 mg/dL (ref 65–99)
POTASSIUM: 4.3 mmol/L (ref 3.5–5.1)
SODIUM: 131 mmol/L — AB (ref 135–145)

## 2017-08-19 MED FILL — FUROSEMIDE 40 MG TAB: 40 | 30 days supply | Qty: 30 | Fill #0

## 2017-08-19 MED FILL — LOSARTAN POTASSIUM 25 MG TA: 25 | 34 days supply | Qty: 17 | Fill #1

## 2017-08-27 ENCOUNTER — Inpatient Hospital Stay (HOSPITAL_COMMUNITY): Admission: RE | Admit: 2017-08-27 | Payer: Medicaid Other | Source: Ambulatory Visit

## 2017-08-29 ENCOUNTER — Encounter (HOSPITAL_COMMUNITY): Payer: Self-pay | Admitting: *Deleted

## 2017-08-29 NOTE — Progress Notes (Signed)
Received medical record request from [facility].  Requested records faxed today to [phone].   Original request will be scanned to patient's electronic medical record.

## 2017-09-05 ENCOUNTER — Telehealth (HOSPITAL_COMMUNITY): Payer: Self-pay | Admitting: *Deleted

## 2017-09-05 MED FILL — METOPROLOL SUCCINATE ER 50: 50 | 30 days supply | Qty: 30 | Fill #1

## 2017-09-05 MED FILL — SPIRONOLACTONE 25 MG TABLET: 25 | 34 days supply | Qty: 34 | Fill #1

## 2017-09-05 NOTE — Telephone Encounter (Signed)
Pt called requesting samples of Entresto, he states he is out and his Medicaid is not straightened out yet so he can not afford to get it at this time.  He states his Medicaid issues should be resolved and it will go into effect by June 10th.  Will leave samples at front desk for pt, advised if he does not have Medicaid when he comes to his appt on 6/12, he will need to complete the [employer] pt assist program forms, he is agreeable.  Medication Samples have been provided to the patient.  Drug name: Sherryll Burger       Strength: 24/26mg         Qty: 4  LOT: [id number]  Exp.Date: 6/21  Dosing instructions: 1 tab Twice daily   The patient has been instructed regarding the correct time, dose, and frequency of taking this medication, including desired effects and most common side effects.   Ramiro Pangilinan 10:54 AM 09/05/2017

## 2017-09-25 ENCOUNTER — Encounter (HOSPITAL_COMMUNITY): Payer: Medicaid Other

## 2017-12-05 ENCOUNTER — Ambulatory Visit (HOSPITAL_BASED_OUTPATIENT_CLINIC_OR_DEPARTMENT_OTHER)
Admission: RE | Admit: 2017-12-05 | Discharge: 2017-12-05 | Disposition: A | Discharge status: Home / Self Care | Payer: Medicaid Other | Source: Ambulatory Visit | Attending: Cardiology | Admitting: Cardiology

## 2017-12-05 ENCOUNTER — Ambulatory Visit (INDEPENDENT_AMBULATORY_CARE_PROVIDER_SITE_OTHER): Payer: Medicaid Other | Admitting: Cardiology

## 2017-12-05 ENCOUNTER — Encounter: Payer: Self-pay | Admitting: Cardiology

## 2017-12-05 VITALS — BP 110/60 | HR 106 | Ht 67.0 in | Wt 146.8 lb

## 2017-12-05 DIAGNOSIS — I4729 Other ventricular tachycardia: Secondary | ICD-10-CM

## 2017-12-05 DIAGNOSIS — Z720 Tobacco use: Secondary | ICD-10-CM

## 2017-12-05 DIAGNOSIS — I5022 Chronic systolic (congestive) heart failure: Secondary | ICD-10-CM | POA: Insufficient documentation

## 2017-12-05 DIAGNOSIS — I420 Dilated cardiomyopathy: Secondary | ICD-10-CM

## 2017-12-05 DIAGNOSIS — I472 Ventricular tachycardia: Secondary | ICD-10-CM | POA: Diagnosis not present

## 2017-12-05 DIAGNOSIS — I10 Essential (primary) hypertension: Secondary | ICD-10-CM | POA: Diagnosis not present

## 2017-12-05 DIAGNOSIS — I110 Hypertensive heart disease with heart failure: Secondary | ICD-10-CM | POA: Insufficient documentation

## 2017-12-05 NOTE — Patient Instructions (Addendum)
Medication Instructions:  Your physician has recommended you make the following change in your medication:   STOP: Losartan.   Labwork: Your physician recommends that you return for lab work in: bmp, cbc And return in one week for: BMP  Testing/Procedures: A chest x-ray takes a picture of the organs and structures inside the chest, including the heart, lungs, and blood vessels. This test can show several things, including, whether the heart is enlarges; whether fluid is building up in the lungs; and whether pacemaker / defibrillator leads are still in place.   ?   Follow-Up: Your physician recommends that you schedule a follow-up appointment in: 3 months.   Any Other Special Instructions Will Be Listed Below (If Applicable).     If you need a refill on your cardiac medications before your next appointment, please call your pharmacy.   Chest X-Ray A chest X-ray is a painless test that uses radiation to create images of the structures inside of your chest. Chest X-rays are used to look for many health conditions, including heart failure, pneumonia, tuberculosis, rib fractures, breathing disorders, and cancer. They may be used to diagnose chest pain, constant coughing, or trouble breathing. Tell a health care provider about:  Any allergies you have.  All medicines you are taking, including vitamins, herbs, eye drops, creams, and over-the-counter medicines.  Any surgeries you have had.  Any medical conditions you have.  Whether you are pregnant or may be pregnant. What are the risks? Getting a chest X-ray is a safe procedure. However, you will be exposed to a small amount of radiation. Being exposed to too much radiation over a lifetime can increase the risk of cancer. This risk is small, but it may occur if you have many X-rays throughout your life. What happens before the procedure?  You may be asked to remove glasses, jewelry, and any other metal objects.  You will be  asked to undress from the waist up. You may be given a hospital gown to wear.  You may be asked to wear a protective lead apron to protect parts of your body from radiation. What happens during the procedure?  You will be asked to stand still as each picture is taken to get the best possible images.  You will be asked to take a deep breath and hold your breath for a few seconds.  The X-ray machine will create a picture of your chest using a tiny burst of radiation. This is painless.  More pictures may be taken from other angles. Typically, one picture will be taken while you face the X-ray camera, and another picture will be taken from the side while you stand. If you cannot stand, you may be asked to lie down. The procedure may vary among health care providers and hospitals. What happens after the procedure?  The X-ray(s) will be reviewed by your health care provider or an X-ray (radiology) specialist.  It is up to you to get your test results. Ask your health care provider, or the department that is doing the test, when your results will be ready.  Your health care provider will tell you if you need more tests or a follow-up exam. Keep all follow-up visits as told by your health care provider. This is important. Summary  A chest X-ray is a safe, painless test that is used to examine the inside of the chest, heart, and lungs.  You will need to undress from the waist up and remove jewelry and metal objects  before the procedure.  You will be exposed to a small amount of radiation during the procedure.  The X-ray machine will take one or more pictures of your chest while you remain as still as possible.  Later, a health care provider or specialist will review the test results with you. This information is not intended to replace advice given to you by your health care provider. Make sure you discuss any questions you have with your health care provider. Document Released: 05/29/2016  Document Revised: 05/29/2016 Document Reviewed: 05/29/2016 Elsevier Interactive Patient Education  [employer].

## 2017-12-05 NOTE — Progress Notes (Signed)
Cardiology Office Note:    Date:  12/05/2017   ID:  Carl Ferguson, DOB 01/13/1966, MRN [id number]  PCP:  Simone Curia, MD  Cardiologist:  Garwin Brothers, MD   Referring MD: Simone Curia, MD    ASSESSMENT:    1. Chronic systolic heart failure (HCC)   2. Dilated cardiomyopathy (HCC)   3. Essential hypertension   4. Nonsustained ventricular tachycardia (HCC)   5. Tobacco abuse    PLAN:    In order of problems listed above:  1. Primary prevention stressed with the patient.  Importance of compliance with diet and medication stressed and he vocalized understanding.  His blood pressure is stable. 2. Diet was discussed for dyslipidemia.  Salt issues with diet were also discussed at length. 3. I spent 5 minutes with the patient discussing solely about smoking. Smoking cessation was counseled. I suggested to the patient also different medications and pharmacological interventions. Patient is keen to try stopping on its own at this time. He will get back to me if he needs any further assistance in this matter. 4. He will have blood work today and I will also do a CBC and chest x-ray to see if there are any issues with pneumonia.  He tells me that he has been treated for pneumonia couple of weeks ago. 5. Patient will be seen in follow-up appointment in 3 months or earlier if the patient has any concerns 6. He will fill out a form to get his Sherryll Burger because of financial issues.  We gave him samples for now and he will start this beginning tomorrow and stop the losartan.  He mentions to me that he was feeling much better when he did the [place].  Compliance was urged.    Medication Adjustments/Labs and Tests Ordered: Current medicines are reviewed at length with the patient today.  Concerns regarding medicines are outlined above.  No orders of the defined types were placed in this encounter.  No orders of the defined types were placed in this encounter.    Chief Complaint  Patient  presents with  . Follow-up     History of Present Illness:    Carl Ferguson is a 52 y.o. male.  Patient has history of congestive heart failure and very advanced cardiomyopathy.  Unfortunately he continues to smoke and is not very compliant with taking medications.  He travels a lot does not take good care of himself.  His significant other is accompanying him for this visit.  There are times when he goes off his medications.  He goes to the emergency room in different places for care as and when needed.  At the time of my evaluation, the patient is alert awake oriented and in no distress.  He complains of some shortness of breath on exertion and mentions to me that he was feeling better on the [facility] but discontinued it because of financial reasons.  Past Medical History:  Diagnosis Date  . CHF (congestive heart failure) (HCC)   . COPD (chronic obstructive pulmonary disease) (HCC)   . Nonischemic cardiomyopathy (HCC)    a. diagnosed in 2011 with EF 30% at that time b. EF improved to 50% by echo in 02/2016    Past Surgical History:  Procedure Laterality Date  . LEFT HEART CATH AND CORONARY ANGIOGRAPHY N/A 06/26/2017   Procedure: LEFT HEART CATH AND CORONARY ANGIOGRAPHY;  Surgeon: [name], MD;  Location: [facility] INVASIVE CV LAB;  Service: Cardiovascular;  Laterality: N/A;  Current Medications: Current Meds  Medication Sig  . aspirin EC 81 MG tablet Take 81 mg by mouth daily.  . furosemide (LASIX) 40 MG tablet Take 1 tablet (40 mg total) by mouth daily.  . metoprolol succinate (TOPROL-XL) 50 MG 24 hr tablet Take 1 tablet (50 mg total) by mouth at bedtime. Take with or immediately following a meal. HF FUND  . omeprazole (PRILOSEC) 20 MG capsule Take 20 mg by mouth daily.  . potassium chloride SA (K-DUR,KLOR-CON) 20 MEQ tablet Take 20 mEq by mouth daily.  . sacubitril-valsartan (ENTRESTO) 24-26 MG Take 1 tablet by mouth 2 (two) times daily.  Marland Kitchen spironolactone (ALDACTONE) 25 MG  tablet Take 1 tablet (25 mg total) by mouth daily.     Allergies:   Norflex [orphenadrine] and Orphenadrine citrate   Social History   Socioeconomic History  . Marital status: Single    Spouse name: Not on file  . Number of children: Not on file  . Years of education: Not on file  . Highest education level: Not on file  Occupational History  . Not on file  Social Needs  . Financial resource strain: Not on file  . Food insecurity:    Worry: Not on file    Inability: Not on file  . Transportation needs:    Medical: Not on file    Non-medical: Not on file  Tobacco Use  . Smoking status: Current Some Day Smoker  . Smokeless tobacco: Never Used  Substance and Sexual Activity  . Alcohol use: Yes    Frequency: Never    Comment: 3-4 beers daily  . Drug use: No  . Sexual activity: Not on file  Lifestyle  . Physical activity:    Days per week: Not on file    Minutes per session: Not on file  . Stress: Not on file  Relationships  . Social connections:    Talks on phone: Not on file    Gets together: Not on file    Attends religious service: Not on file    Active member of club or organization: Not on file    Attends meetings of clubs or organizations: Not on file    Relationship status: Not on file  Other Topics Concern  . Not on file  Social History Narrative  . Not on file     Family History: The patient's family history includes Heart attack in his mother. There is no history of Sudden Cardiac Death.  ROS:   Please see the history of present illness.    All other systems reviewed and are negative.  EKGs/Labs/Other Studies Reviewed:    The following studies were reviewed today: I discussed my findings with the patient at length.   Recent Labs: 06/24/2017: Hemoglobin 16.4; Platelets 298 07/02/2017: B Natriuretic Peptide 532.9; Magnesium 2.2 08/07/2017: BUN 19; Creatinine, Ser 1.15; Potassium 4.3; Sodium 131  Recent Lipid Panel No results found for: CHOL, TRIG,  HDL, CHOLHDL, VLDL, LDLCALC, LDLDIRECT  Physical Exam:    VS:  BP 110/60   Pulse (!) 106   Ht 5\' 7"  (1.702 m)   Wt 146 lb 12.8 oz (66.6 kg)   SpO2 97%   BMI 22.99 kg/m     Wt Readings from Last 3 Encounters:  12/05/17 146 lb 12.8 oz (66.6 kg)  07/30/17 157 lb 6.4 oz (71.4 kg)  07/25/17 155 lb 1.9 oz (70.4 kg)     GEN: Patient is in no acute distress HEENT: Normal NECK: No JVD; No  carotid bruits LYMPHATICS: No lymphadenopathy CARDIAC: Hear sounds regular, 2/6 systolic murmur at the apex. RESPIRATORY:  Clear to auscultation without rales, wheezing or rhonchi  ABDOMEN: Soft, non-tender, non-distended MUSCULOSKELETAL:  No edema; No deformity  SKIN: Warm and dry NEUROLOGIC:  Alert and oriented x 3 PSYCHIATRIC:  Normal affect   Signed, Garwin Brothers, MD  12/05/2017 3:32 PM    [facility]

## 2017-12-05 NOTE — Addendum Note (Signed)
Addended by: Lita Mains on: 12/05/2017 03:58 PM   Modules accepted: Orders

## 2017-12-05 NOTE — Addendum Note (Signed)
Addended by: Lita Mains on: 12/05/2017 03:57 PM   Modules accepted: Orders

## 2017-12-06 LAB — CBC
HEMATOCRIT: 45.2 % (ref 37.5–51.0)
Hemoglobin: 16.1 g/dL (ref 13.0–17.7)
MCH: 33.9 pg — AB (ref 26.6–33.0)
MCHC: 35.6 g/dL (ref 31.5–35.7)
MCV: 95 fL (ref 79–97)
Platelets: 222 10*3/uL (ref 150–450)
RBC: 4.75 x10E6/uL (ref 4.14–5.80)
RDW: 14.5 % (ref 12.3–15.4)
WBC: 8.8 10*3/uL (ref 3.4–10.8)

## 2017-12-06 LAB — BASIC METABOLIC PANEL
BUN/Creatinine Ratio: 12 (ref 9–20)
BUN: 13 mg/dL (ref 6–24)
CALCIUM: 9.7 mg/dL (ref 8.7–10.2)
CO2: 22 mmol/L (ref 20–29)
CREATININE: 1.07 mg/dL (ref 0.76–1.27)
Chloride: 101 mmol/L (ref 96–106)
GFR calc Af Amer: 93 mL/min/{1.73_m2} (ref >59)
GFR, EST NON AFRICAN AMERICAN: 81 mL/min/{1.73_m2} (ref >59)
Glucose: 90 mg/dL (ref 65–99)
Potassium: 4.1 mmol/L (ref 3.5–5.2)
Sodium: 140 mmol/L (ref 134–144)

## 2017-12-09 MED FILL — METOPROLOL SUCCINATE ER 50: 50 | 30 days supply | Qty: 30 | Fill #2

## 2017-12-09 MED FILL — SPIRONOLACTONE 25 MG TABLET: 25 | 34 days supply | Qty: 34 | Fill #2

## 2017-12-09 MED FILL — FUROSEMIDE 40 MG TAB: 40 | 30 days supply | Qty: 30 | Fill #1

## 2017-12-12 ENCOUNTER — Encounter (HOSPITAL_COMMUNITY): Payer: Medicaid Other

## 2017-12-13 ENCOUNTER — Encounter (HOSPITAL_COMMUNITY): Payer: Self-pay

## 2017-12-13 ENCOUNTER — Ambulatory Visit (HOSPITAL_COMMUNITY)
Admission: RE | Admit: 2017-12-13 | Discharge: 2017-12-13 | Disposition: A | Discharge status: Home / Self Care | Payer: Medicaid Other | Source: Ambulatory Visit | Attending: Cardiology | Admitting: Cardiology

## 2017-12-13 VITALS — BP 92/70 | HR 110 | Wt 150.4 lb

## 2017-12-13 DIAGNOSIS — I5022 Chronic systolic (congestive) heart failure: Secondary | ICD-10-CM | POA: Diagnosis not present

## 2017-12-13 DIAGNOSIS — Z79899 Other long term (current) drug therapy: Secondary | ICD-10-CM | POA: Diagnosis not present

## 2017-12-13 DIAGNOSIS — F17210 Nicotine dependence, cigarettes, uncomplicated: Secondary | ICD-10-CM | POA: Diagnosis not present

## 2017-12-13 DIAGNOSIS — Z7982 Long term (current) use of aspirin: Secondary | ICD-10-CM | POA: Insufficient documentation

## 2017-12-13 DIAGNOSIS — I110 Hypertensive heart disease with heart failure: Secondary | ICD-10-CM | POA: Insufficient documentation

## 2017-12-13 DIAGNOSIS — F1010 Alcohol abuse, uncomplicated: Secondary | ICD-10-CM

## 2017-12-13 DIAGNOSIS — I420 Dilated cardiomyopathy: Secondary | ICD-10-CM | POA: Diagnosis not present

## 2017-12-13 DIAGNOSIS — I429 Cardiomyopathy, unspecified: Secondary | ICD-10-CM | POA: Diagnosis not present

## 2017-12-13 DIAGNOSIS — I447 Left bundle-branch block, unspecified: Secondary | ICD-10-CM | POA: Diagnosis not present

## 2017-12-13 DIAGNOSIS — I10 Essential (primary) hypertension: Secondary | ICD-10-CM | POA: Diagnosis not present

## 2017-12-13 DIAGNOSIS — I4729 Other ventricular tachycardia: Secondary | ICD-10-CM

## 2017-12-13 DIAGNOSIS — Z720 Tobacco use: Secondary | ICD-10-CM

## 2017-12-13 DIAGNOSIS — I472 Ventricular tachycardia: Secondary | ICD-10-CM | POA: Diagnosis not present

## 2017-12-13 LAB — BASIC METABOLIC PANEL
Anion gap: 9 (ref 5–15)
BUN: 21 mg/dL — AB (ref 6–20)
CHLORIDE: 105 mmol/L (ref 98–111)
CO2: 23 mmol/L (ref 22–32)
CREATININE: 1.21 mg/dL (ref 0.61–1.24)
Calcium: 9.2 mg/dL (ref 8.9–10.3)
GFR calc Af Amer: >60 mL/min (ref >60)
GFR calc non Af Amer: >60 mL/min (ref >60)
Glucose, Bld: 94 mg/dL (ref 70–99)
POTASSIUM: 3.7 mmol/L (ref 3.5–5.1)
Sodium: 137 mmol/L (ref 135–145)

## 2017-12-13 MED ORDER — FUROSEMIDE 40 MG PO TABS: 40.0000 mg | ORAL_TABLET | Freq: Two times a day (BID) | ORAL | 3 refills | Status: DC

## 2017-12-13 NOTE — Progress Notes (Signed)
Advanced Heart Failure Clinic Note   Referring Physician:  PCP: Simone Curia, MD  PCP-Cardiologist: Garwin Brothers, MD [facility], requesting transfer   HPI:  Carl Ferguson is a 52 y.o. 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  Pt has long complicated history of CHF. Previously had EF down to 30% in 2011, which improved to 50% on medical therapy. He had ICD/?CRT for Systolic CHF and LBBB. He had problems with the leads of his first ICD, so has a SubQ ICD placed. This became infected so all equipment was removed. He had a second ICD placed, which was also subsequently removed due to infection.   Admitted 2/28 - 06/17/17 with SOB, orthopnea, and PND. He was off medicines for about 6 weeks prior due to insurance issues. Repeat Echo significant for fall in EF to 25%, from previously 50% in 2017 at [facility].   Followed up with Dr. Tomie China 06/24/17. Feeling better at this appointment. Referred for LHC and HF team.  LHC 06/26/17 as below with NICM and elevated LVEDP. Lasix increased.   He presents today for regular follow up. Seen by [facility] 12/05/17 and meds moved around as patient had been off for several weeks due to "miscommunication" with ER. He continues to complain of fatigue, and insomnia. + Orthopnea, and has to sleep in recliner. He continues to smoke ~ 1/2 ppd. Watching his ETOH in take. No drug use. He gets around the house OK. SOB walking into clinic. Occasionally SOB changing clothes or bathing.   Echo 06/14/17 LVEF 15-20%, diffuse HK, Mod/Sev MR, Sev LAE, Mild TR, PA peak pressure 45 mm Hg, Normal RV.  LHC 06/26/17  - Widely patent coronary arteries without significant obstructive disease.  - Moderately elevated LVEDP  - Known severe LV systolic dysfunction.   CPX 07/08/17 FVC 3.94 (104%)  FEV1 3.25 (107%)  FEV1/FVC 82 (102%)  MVV 109 (76%)  Exercise Time: 15:15 Speed (mph): 3.0  Grade (%): 12.5 RPE: 15  Reason stopped: patient ended test due to being lightheaded (8/10) Additional symptoms: Dyspnea (7/10) Resting HR: 105 Peak HR: 140 (82% age predicted max HR) BP rest: 92/80 BP peak: 108/78 Peak VO2: 24.3 (68% predicted peak VO2) VE/VCO2 slope: 37 OUES: 1.79 Peak RER: 0.91 Ventilatory Threshold: 20.4 (57% predicted and 84% measured peak VO2) Peak RR 38 Peak Ventilation: 58.7 VE/MVV: 54% PETCO2 at peak: 29 O2pulse: 12 (86% predicted O2pulse)   Review of systems complete and found to be negative unless listed in HPI.    Social History   Socioeconomic History  . Marital status: Single    Spouse name: Not on file  . Number of children: Not on file  . Years of education: Not on file  . Highest education level: Not on file  Occupational History  . Not on file  Social Needs  . Financial resource strain: Not on file  . Food insecurity:    Worry: Not on file    Inability: Not on file  . Transportation needs:    Medical: Not on file    Non-medical: Not on file  Tobacco Use  . Smoking status: Current Some Day Smoker  . Smokeless tobacco: Never Used  Substance and Sexual Activity  . Alcohol use: Yes    Frequency: Never    Comment: 3-4 beers daily  . Drug use: No  . Sexual activity: Not on file  Lifestyle  .  Physical activity:    Days per week: Not on file    Minutes per session: Not on file  . Stress: Not on file  Relationships  . Social connections:    Talks on phone: Not on file    Gets together: Not on file    Attends religious service: Not on file    Active member of club or organization: Not on file    Attends meetings of clubs or organizations: Not on file    Relationship status: Not on file  . Intimate partner violence:    Fear of current or ex partner: Not on file    Emotionally abused: Not on file    Physically abused: Not on file    Forced sexual activity: Not on file  Other Topics Concern  . Not on file  Social History  Narrative  . Not on file   Family History  Problem Relation Age of Onset  . Heart attack Mother   . Sudden Cardiac Death Neg Hx    Allergies  Allergen Reactions  . Norflex [Orphenadrine] Swelling  . Orphenadrine Citrate Swelling    Face swelling    Current Outpatient Medications  Medication Sig Dispense Refill  . aspirin EC 81 MG tablet Take 81 mg by mouth daily.    . furosemide (LASIX) 40 MG tablet Take 1 tablet (40 mg total) by mouth daily. 30 tablet 3  . metoprolol succinate (TOPROL-XL) 50 MG 24 hr tablet Take 1 tablet (50 mg total) by mouth at bedtime. Take with or immediately following a meal. HF FUND 30 tablet 3  . omeprazole (PRILOSEC) 20 MG capsule Take 20 mg by mouth daily.    . sacubitril-valsartan (ENTRESTO) 24-26 MG Take 1 tablet by mouth 2 (two) times daily. 60 tablet 3  . spironolactone (ALDACTONE) 25 MG tablet Take 1 tablet (25 mg total) by mouth daily. 30 tablet 2  . potassium chloride SA (K-DUR,KLOR-CON) 20 MEQ tablet Take 20 mEq by mouth daily.     No current facility-administered medications for this encounter.     Vitals:   12/13/17 1047  BP: 92/70  Pulse: (!) 110  SpO2: 98%  Weight: 68.2 kg (150 lb 6.4 oz)     Wt Readings from Last 3 Encounters:  12/13/17 68.2 kg (150 lb 6.4 oz)  12/05/17 66.6 kg (146 lb 12.8 oz)  07/30/17 71.4 kg (157 lb 6.4 oz)    General: Fatigued appearing. No resp difficulty. HEENT: Normal Neck: Supple. JVP 8-9. Carotids 2+ bilat; no bruits. No thyromegaly or nodule noted. Cor: PMI nondisplaced. RRR, No M/G/R noted Lungs: CTAB, normal effort. Abdomen: Soft, non-tender, non-distended, no HSM. No bruits or masses. +BS  Extremities: No cyanosis, clubbing, or rash. R and LLE no edema.  Neuro: Alert & orientedx3, cranial nerves grossly intact. moves all 4 extremities w/o difficulty. Affect pleasant   ASSESSMENT & PLAN:  1. Chronic Systolic CHF, NICM  - Cath 06/2017 with NICM.  - Echo 06/2017 with LVEF 15-20%. (Previously 50% in  2017, which had improved from 30% in 2011)  - CPX 07/2017 with mild/Mod HF limitation. VE/VCO2 slope: 37 and blunted BP response.  - NYHA III-IIIb symptoms - Volume status difficult on exam, but did not appear elevated, despite ReDs Vest 45%.   - Repeat Echo.  - Will increase lasix to 40 mg BID for now.  - Continue Entresto 24/26 mg BID.  - Continue Toprol XL 50 mg daily last week. Will be cautious with some concerns for low  output.  - Continue spiro 25 mg daily  - HIV negative 06/2017  - CPX 06/2017 with mild to mod HF limitation and elevated slope/blunted BP response. Consider repeat 6 months.  2. HTN  - Meds as above.   3. Chronic LBBB  - QRS 154 last check. - He is poor candidate for ICD, as he has infected at least 2, by his history, possibly 3.  - Will need EP follow up if EF not improved.  4. ETOH Abuse  - Encouraged complete cessation.   5. Tobacco abuse  - Encouraged complete cessation.   - Continue nicotine patches.  6. H/o ICD s/p multiple extractions  - See Chronic LBBB discussion above. 7. ED  - OK to use low dose Viagra.   Labs and meds as above. Repeat Echo. RTC 2 weeks to re-assess fluid status and vest. If symptoms do not improve, may need RHC for further clarity.   Graciella Freer, PA-C  12/13/17   Greater than 50% of the 25 minute visit was spent in counseling/coordination of care regarding disease state education, salt/fluid restriction, sliding scale diuretics, and medication compliance.

## 2017-12-13 NOTE — Patient Instructions (Addendum)
Labs today (will call for abnormal results, otherwise no news is good news)  INCREASE Lasix to 40 mg Twice Daily.   Echocardiogram and follow up in 2 weeks.

## 2017-12-18 ENCOUNTER — Telehealth (HOSPITAL_COMMUNITY): Payer: Self-pay | Admitting: Cardiology

## 2017-12-18 MED ORDER — POTASSIUM CHLORIDE CRYS ER 20 MEQ PO TBCR: 20.0000 meq | EXTENDED_RELEASE_TABLET | Freq: Every day | ORAL | 3 refills | Status: DC

## 2017-12-18 NOTE — Telephone Encounter (Signed)
Notes recorded by Theresia Bough, CMA on 12/18/2017 at 12:35 PM EDT Patient aware. Patient voiced understanding   ------  Notes recorded by Theresia Bough, CMA on 12/13/2017 at 3:45 PM EDT Left message for patient to call back.  [phone] (M) ------  Notes recorded by Graciella Freer, PA-C on 12/13/2017 at 3:37 PM EDT Needs to resume K 20 Meq daily  Casimiro Needle "Mardelle Matte" [place], [place]

## 2017-12-26 ENCOUNTER — Encounter (HOSPITAL_COMMUNITY): Payer: Self-pay

## 2017-12-26 ENCOUNTER — Ambulatory Visit (HOSPITAL_BASED_OUTPATIENT_CLINIC_OR_DEPARTMENT_OTHER)
Admission: RE | Admit: 2017-12-26 | Discharge: 2017-12-26 | Disposition: A | Discharge status: Home / Self Care | Payer: Medicaid Other | Source: Ambulatory Visit | Attending: Cardiology | Admitting: Cardiology

## 2017-12-26 ENCOUNTER — Other Ambulatory Visit (HOSPITAL_COMMUNITY): Payer: Self-pay

## 2017-12-26 ENCOUNTER — Ambulatory Visit (HOSPITAL_COMMUNITY)
Admission: RE | Admit: 2017-12-26 | Discharge: 2017-12-26 | Disposition: A | Discharge status: Home / Self Care | Payer: Medicaid Other | Source: Ambulatory Visit | Attending: Adult Health | Admitting: Adult Health

## 2017-12-26 VITALS — BP 96/84 | HR 96 | Wt 146.0 lb

## 2017-12-26 DIAGNOSIS — F17210 Nicotine dependence, cigarettes, uncomplicated: Secondary | ICD-10-CM | POA: Diagnosis not present

## 2017-12-26 DIAGNOSIS — N529 Male erectile dysfunction, unspecified: Secondary | ICD-10-CM | POA: Diagnosis not present

## 2017-12-26 DIAGNOSIS — I428 Other cardiomyopathies: Secondary | ICD-10-CM | POA: Diagnosis not present

## 2017-12-26 DIAGNOSIS — Z79899 Other long term (current) drug therapy: Secondary | ICD-10-CM | POA: Diagnosis not present

## 2017-12-26 DIAGNOSIS — Z95810 Presence of automatic (implantable) cardiac defibrillator: Secondary | ICD-10-CM | POA: Diagnosis not present

## 2017-12-26 DIAGNOSIS — F1010 Alcohol abuse, uncomplicated: Secondary | ICD-10-CM | POA: Insufficient documentation

## 2017-12-26 DIAGNOSIS — I081 Rheumatic disorders of both mitral and tricuspid valves: Secondary | ICD-10-CM | POA: Insufficient documentation

## 2017-12-26 DIAGNOSIS — I447 Left bundle-branch block, unspecified: Secondary | ICD-10-CM | POA: Insufficient documentation

## 2017-12-26 DIAGNOSIS — I5022 Chronic systolic (congestive) heart failure: Secondary | ICD-10-CM | POA: Insufficient documentation

## 2017-12-26 DIAGNOSIS — Z7982 Long term (current) use of aspirin: Secondary | ICD-10-CM | POA: Insufficient documentation

## 2017-12-26 LAB — PROTIME-INR
INR: 1.15
Prothrombin Time: 14.6 seconds (ref 11.4–15.2)

## 2017-12-26 LAB — CBC
HEMATOCRIT: 48.1 % (ref 39.0–52.0)
Hemoglobin: 16.9 g/dL (ref 13.0–17.0)
MCH: 33.3 pg (ref 26.0–34.0)
MCHC: 35.1 g/dL (ref 30.0–36.0)
MCV: 94.9 fL (ref 78.0–100.0)
Platelets: 240 10*3/uL (ref 150–400)
RBC: 5.07 MIL/uL (ref 4.22–5.81)
RDW: 13.6 % (ref 11.5–15.5)
WBC: 9.8 10*3/uL (ref 4.0–10.5)

## 2017-12-26 LAB — BASIC METABOLIC PANEL
Anion gap: 10 (ref 5–15)
BUN: 20 mg/dL (ref 6–20)
CALCIUM: 9.5 mg/dL (ref 8.9–10.3)
CO2: 22 mmol/L (ref 22–32)
Chloride: 100 mmol/L (ref 98–111)
Creatinine, Ser: 1.24 mg/dL (ref 0.61–1.24)
GFR calc non Af Amer: >60 mL/min (ref >60)
Glucose, Bld: 106 mg/dL — ABNORMAL HIGH (ref 70–99)
Potassium: 4.7 mmol/L (ref 3.5–5.1)
SODIUM: 132 mmol/L — AB (ref 135–145)

## 2017-12-26 MED ORDER — METOPROLOL SUCCINATE ER 50 MG PO TB24: 50.0000 mg | ORAL_TABLET | Freq: Every day | ORAL | 2 refills | Status: DC

## 2017-12-26 MED ORDER — POTASSIUM CHLORIDE CRYS ER 20 MEQ PO TBCR: 40.0000 meq | EXTENDED_RELEASE_TABLET | Freq: Every day | ORAL | 2 refills | Status: DC

## 2017-12-26 MED ORDER — DIGOXIN 125 MCG PO TABS: 0.1250 mg | ORAL_TABLET | Freq: Every day | ORAL | 2 refills | Status: DC

## 2017-12-26 MED ORDER — ASPIRIN EC 81 MG PO TBEC: 81.0000 mg | DELAYED_RELEASE_TABLET | Freq: Every day | ORAL | 2 refills | Status: DC

## 2017-12-26 MED ORDER — FUROSEMIDE 40 MG PO TABS: ORAL_TABLET | ORAL | 2 refills | Status: DC

## 2017-12-26 MED ORDER — SPIRONOLACTONE 25 MG PO TABS: 25.0000 mg | ORAL_TABLET | Freq: Every day | ORAL | 2 refills | Status: DC

## 2017-12-26 MED FILL — FUROSEMIDE 40 MG TAB: 40 | 30 days supply | Qty: 90 | Fill #0

## 2017-12-26 MED FILL — ASPIRIN ADULT LOW STRENGTH: 81 | 30 days supply | Qty: 30 | Fill #0

## 2017-12-26 MED FILL — DIGOXIN 0.125 MG TABLET: 125 | 30 days supply | Qty: 30 | Fill #0

## 2017-12-26 MED FILL — POTASSIUM CL ER 20 MEQ TAB: 20 | 30 days supply | Qty: 60 | Fill #0

## 2017-12-26 NOTE — Progress Notes (Signed)
REDS VEST/CLIP  CLIPS VALUE 35 SITTING, TALL (STATION C), RULER 30, ALIGNED

## 2017-12-26 NOTE — H&P (View-Only) (Signed)
Advanced Heart Failure Clinic Note   PCP: Patient, No Pcp Per  PCP-Cardiologist: Rajan R Revankar, MD  HF Cardiology: Dr. Aneli Zara  HPI:  Carl Ferguson is a 51 y.o. male with systolic CHF, NICM, Chronic LBBB, Tobacco abuse, prior ETOH abuse, prior substance abuse, and h/o of device placements with extractions due to infection.   Pt has long complicated history of CHF.  Care was initially in [place] and at [facility].  Previously had EF down to 30% in 2011 at initial diagnosis.  In 4/13, he got a single chamber ICD.  In 9/13, he had implantation of a subcutaneous array for high DFTs while on amiodarone for VT.  Echo in 11/15 with EF 30%.  In 11/15, single chamber ICD, leads, and subcutaneous array all removed due to infection.  Discharged with [employer]. In 1/16, he had subcutaneous ICD placed.  In 9/16, this was explanted due to erosion.  EF on echo in 9/16 was 45%.  Echo in 11/17 showed EF 50%.    Admitted to [facility] 2/28 - 06/17/17 with SOB, orthopnea, and PND. He was off medicines for about 6 weeks prior due to insurance issues. Repeat Echo (3/19) significant for fall in EF to 15-20% with severe LV dilation and severe diffuse hypokinesis, moderate-severe MR.   Followed up with Dr. Revankar 06/24/17. Feeling better at this appointment. Referred for LHC and HF team. Dr Cooper performed LHC March 2019. No significant CAD.  CPX 3/19 showed peak VO2 24 (68% predicted) with VE/VCO2 slope 37 with RER 0.91 (submaximal).  Probably mild to moderate functional impairment.   Echo was done today and reviewed.  EF is around 20% with severe dilation, severe central mitral regurgitation. Severe LAE.   Today he returns for 2 week HF follow up. Last visit, Lasix was increased to 40 mg twice a day. Having ongoing cough. SOB with exertion just walking around his house. Denies PND. + Orthopnea, sleeps in a recliner.  Weight has gone down from 150 to 146 pounds in setting of poor appetite. Taking all medications except  potassium. Usually smokes 1 pack cigarettes every 2 days. He has difficulty paying for medications.  Occasional ETOH use, no drugs.    ECG (8/19): NSR, LBBB QRS 164 msec  Labs (9/19): K 4.7, creatinine 1.24  PMH: 1. Active smoker.  2. Chronic systolic CHF: Nonischemic cardiomyopathy, diagnosed in 2011. HIV negative.  History of drug/ETOH abuse, now stopped.  - Echo 2011: EF 30%.  - In 4/13, he got a single chamber ICD.  In 9/13, he had implantation of a subcutaneous array for high DFTs while on amiodarone for VT.  - Echo 11/15: EF 30%.  - In 11/15, single chamber ICD, leads, and subcutaneous array all removed due to infection. - In 1/16, he had subcutaneous ICD placed.  In 9/16, this was explanted due to erosion. - Echo 9/16: EF 45% - Echo 11/17: EF 50% - Echo (3/19): EF 15-20% with severe LV dilation and severe diffuse hypokinesis, moderate-severe MR.  - LHC (3/19): No significant coronary disease.  - CPX (3/19): Peak VO2 24 (68% predicted) with VE/VCO2 slope 37 with RER 0.91 (submaximal).  Probably mild to moderate functional impairment.  - Echo (9/19):  EF is around 20% with severe dilation, severe central mitral regurgitation. Severe LAE. 3. LBBB 3. Mitral regurgitation: Severe on 9/19 echo. Suspect primarily functional.   Review of systems complete and found to be negative unless listed in HPI.    Social History   Socioeconomic History  .   Marital status: Single    Spouse name: Not on file  . Number of children: Not on file  . Years of education: Not on file  . Highest education level: Not on file  Occupational History  . Not on file  Social Needs  . Financial resource strain: Not on file  . Food insecurity:    Worry: Not on file    Inability: Not on file  . Transportation needs:    Medical: Not on file    Non-medical: Not on file  Tobacco Use  . Smoking status: Current Some Day Smoker  . Smokeless tobacco: Never Used  Substance and Sexual Activity  . Alcohol use:  Yes    Frequency: Never    Comment: 3-4 beers daily  . Drug use: No  . Sexual activity: Not on file  Lifestyle  . Physical activity:    Days per week: Not on file    Minutes per session: Not on file  . Stress: Not on file  Relationships  . Social connections:    Talks on phone: Not on file    Gets together: Not on file    Attends religious service: Not on file    Active member of club or organization: Not on file    Attends meetings of clubs or organizations: Not on file    Relationship status: Not on file  . Intimate partner violence:    Fear of current or ex partner: Not on file    Emotionally abused: Not on file    Physically abused: Not on file    Forced sexual activity: Not on file  Other Topics Concern  . Not on file  Social History Narrative  . Not on file   Family History  Problem Relation Age of Onset  . Heart attack Mother   . Sudden Cardiac Death Neg Hx    Allergies  Allergen Reactions  . Norflex [Orphenadrine] Swelling  . Orphenadrine Citrate Swelling    Face swelling    Current Outpatient Medications  Medication Sig Dispense Refill  . aspirin EC 81 MG tablet Take 81 mg by mouth daily.    . furosemide (LASIX) 40 MG tablet Take 1 tablet (40 mg total) by mouth 2 (two) times daily. 34 tablet 3  . metoprolol succinate (TOPROL-XL) 50 MG 24 hr tablet Take 1 tablet (50 mg total) by mouth at bedtime. Take with or immediately following a meal. HF FUND 30 tablet 3  . omeprazole (PRILOSEC) 20 MG capsule Take 20 mg by mouth daily.    . sacubitril-valsartan (ENTRESTO) 24-26 MG Take 1 tablet by mouth 2 (two) times daily. 60 tablet 3  . spironolactone (ALDACTONE) 25 MG tablet Take 1 tablet (25 mg total) by mouth daily. 30 tablet 2  . potassium chloride SA (K-DUR,KLOR-CON) 20 MEQ tablet Take 1 tablet (20 mEq total) by mouth daily. (Patient not taking: Reported on 12/26/2017) 30 tablet 3   No current facility-administered medications for this encounter.     Vitals:    12/26/17 1200  BP: 96/84  Pulse: 96  SpO2: 100%  Weight: 66.2 kg (146 lb)     Wt Readings from Last 3 Encounters:  12/26/17 66.2 kg (146 lb)  12/13/17 68.2 kg (150 lb 6.4 oz)  12/05/17 66.6 kg (146 lb 12.8 oz)    REDS clip 35%   General: NAD Neck: JVP 9-10 cm, no thyromegaly or thyroid nodule.  Lungs: Clear to auscultation bilaterally with normal respiratory effort. CV: Nondisplaced PMI.  Heart   regular S1/S2, +S3, 2/6 HSM apex.  No peripheral edema.  No carotid bruit.  Normal pedal pulses.  Abdomen: Soft, nontender, no hepatosplenomegaly, no distention.  Skin: Intact without lesions or rashes.  Neurologic: Alert and oriented x 3.  Psych: Normal affect. Extremities: No clubbing or cyanosis.  HEENT: Normal.   ASSESSMENT & PLAN:  1. Chronic Systolic CHF:  Nonischemic cardiomyopathy, diagnosed in 2011, initially followed at [place]/[facility].  LHC (3/19) with no significant CAD.  CPX (3/19) submaximal, but suggestive of mild to moderate HF limitation. HIV negative 3/19, history of drug/ETOH abuse in the past, no drugs now and has cut back ETOH considerably.  Echo was done today and reviewed, EF 20% with severe LV dilation, severe MR.  NYHA class IIIb symptoms.  He is volume overloaded on exam. He has a wide LBBB.  He does not have an ICD, 2 subcutaneous ICD systems were removed for infection at [facility]. SBP soft in 90s today.  - Continue Toprol XL 50 daily, spironolactone 25 daily, and Entresto 24/26 bid. No BP room to titrate.  - I will add digoxin 0.125 daily today.  - I am worried about his trajectory with worsening symptoms and markedly low EF. I will arrange for RHC next week to assess filling pressures and cardiac output.  I discussed risks/benefits with patient and he agrees to the procedure.   - I am concerned that LBBB with dyssynchrony plays a significant role in his severe central MR (likely primarily functional).  Ideally, he would have CRT-D implantation.  However, he had infection of 2  subcutaneous ICD systems at [facility], with device explantations in 11/15 and 9/16. I will have him see EP to see whether he would be a CRT-D candidate. He is no longer using drugs and does not drink much at this point. This may help his MR.  If not, would consider Mitraclip (would need TEE).  - He may eventually be an LVAD candidate.  Will need to work on getting him on Medicaid. He will not be a transplant candidate at this time due to active smoking.  2. ETOH Abuse: Only occasional ETOH use now.   -  Discussed complete cessation  3. Tobacco abuse  -Discussed smoking cessation  4. Erectile dysfunction:   - OK to use low dose Viagra.  5. Mitral regurgitation: Severe MR on echo today.  MR appears central, and occurs in the setting of severe LV dilation as well as septal-lateral dyssynchrony with wide LBBB.  - CRT may decrease MR, but not sure he will be a candidate with prior device infections.  Have referred to EP.  - If not a CRT candidate, will need to workup for Mitraclip.  Will need TEE.  6. Referred to social worker to help him get Medicaid.   Carl Ferguson 12/27/2017  

## 2017-12-26 NOTE — Patient Instructions (Addendum)
INCREASE Lasix to 80 mg (2 tabs) in am and 40 mg (1 tab) in pm.  INCREASE Potassium to 40 mg (2 tabs) once daily.  START Digoxin 0.125 mg tablet once daily.  Prescription has been sent to [facility] under the [facility]. Address: [address] [place], [place] [address]  Phone: [phone] Located next to [facility].  Will refer you to electrophysiology at [facility]. Address: [address] Floor), [place], [place] [address]  Phone: [phone] Their office will call you to schedule.  RHC next week. See attached sheet for instructions.  Follow up 4 weeks with Dr. Shirlee Latch.  ______________________________________________________________________ [place] Code: 1700  Take all medication as prescribed the day of your appointment. Bring all medications with you to your appointment.  Do the following things EVERYDAY: 1) Weigh yourself in the morning before breakfast. Write it down and keep it in a log. 2) Take your medicines as prescribed 3) Eat low salt foods-Limit salt (sodium) to 2000 mg per day.  4) Stay as active as you can everyday 5) Limit all fluids for the day to less than 2 liters

## 2017-12-26 NOTE — Progress Notes (Signed)
Advanced Heart Failure Clinic Note   PCP: Patient, No Pcp Per  PCP-Cardiologist: Garwin Brothers, MD  HF Cardiology: Dr. Shirlee Latch  HPI:  Carl Ferguson is a 52 y.o. male with systolic CHF, NICM, Chronic LBBB, Tobacco abuse, prior ETOH abuse, prior substance abuse, and h/o of device placements with extractions due to infection.   Pt has long complicated history of CHF.  Care was initially in [place] and at [facility].  Previously had EF down to 30% in 2011 at initial diagnosis.  In 4/13, he got a single chamber ICD.  In 9/13, he had implantation of a subcutaneous array for high DFTs while on amiodarone for VT.  Echo in 11/15 with EF 30%.  In 11/15, single chamber ICD, leads, and subcutaneous array all removed due to infection.  Discharged with [employer]. In 1/16, he had subcutaneous ICD placed.  In 9/16, this was explanted due to erosion.  EF on echo in 9/16 was 45%.  Echo in 11/17 showed EF 50%.    Admitted to [facility] 2/28 - 06/17/17 with SOB, orthopnea, and PND. He was off medicines for about 6 weeks prior due to insurance issues. Repeat Echo (3/19) significant for fall in EF to 15-20% with severe LV dilation and severe diffuse hypokinesis, moderate-severe MR.   Followed up with Dr. Tomie China 06/24/17. Feeling better at this appointment. Referred for LHC and HF team. Dr Excell Seltzer performed [facility] March 2019. No significant CAD.  CPX 3/19 showed peak VO2 24 (68% predicted) with VE/VCO2 slope 37 with RER 0.91 (submaximal).  Probably mild to moderate functional impairment.   Echo was done today and reviewed.  EF is around 20% with severe dilation, severe central mitral regurgitation. Severe LAE.   Today he returns for 2 week HF follow up. Last visit, Lasix was increased to 40 mg twice a day. Having ongoing cough. SOB with exertion just walking around his house. Denies PND. + Orthopnea, sleeps in a recliner.  Weight has gone down from 150 to 146 pounds in setting of poor appetite. Taking all medications except  potassium. Usually smokes 1 pack cigarettes every 2 days. He has difficulty paying for medications.  Occasional ETOH use, no drugs.    ECG (8/19): NSR, LBBB QRS 164 msec  Labs (9/19): K 4.7, creatinine 1.24  PMH: 1. Active smoker.  2. Chronic systolic CHF: Nonischemic cardiomyopathy, diagnosed in 2011. HIV negative.  History of drug/ETOH abuse, now stopped.  - Echo 2011: EF 30%.  - In 4/13, he got a single chamber ICD.  In 9/13, he had implantation of a subcutaneous array for high DFTs while on amiodarone for VT.  - Echo 11/15: EF 30%.  - In 11/15, single chamber ICD, leads, and subcutaneous array all removed due to infection. - In 1/16, he had subcutaneous ICD placed.  In 9/16, this was explanted due to erosion. - Echo 9/16: EF 45% - Echo 11/17: EF 50% - Echo (3/19): EF 15-20% with severe LV dilation and severe diffuse hypokinesis, moderate-severe MR.  - LHC (3/19): No significant coronary disease.  - CPX (3/19): Peak VO2 24 (68% predicted) with VE/VCO2 slope 37 with RER 0.91 (submaximal).  Probably mild to moderate functional impairment.  - Echo (9/19):  EF is around 20% with severe dilation, severe central mitral regurgitation. Severe LAE. 3. LBBB 3. Mitral regurgitation: Severe on 9/19 echo. Suspect primarily functional.   Review of systems complete and found to be negative unless listed in HPI.    Social History   Socioeconomic History  .  Marital status: Single    Spouse name: Not on file  . Number of children: Not on file  . Years of education: Not on file  . Highest education level: Not on file  Occupational History  . Not on file  Social Needs  . Financial resource strain: Not on file  . Food insecurity:    Worry: Not on file    Inability: Not on file  . Transportation needs:    Medical: Not on file    Non-medical: Not on file  Tobacco Use  . Smoking status: Current Some Day Smoker  . Smokeless tobacco: Never Used  Substance and Sexual Activity  . Alcohol use:  Yes    Frequency: Never    Comment: 3-4 beers daily  . Drug use: No  . Sexual activity: Not on file  Lifestyle  . Physical activity:    Days per week: Not on file    Minutes per session: Not on file  . Stress: Not on file  Relationships  . Social connections:    Talks on phone: Not on file    Gets together: Not on file    Attends religious service: Not on file    Active member of club or organization: Not on file    Attends meetings of clubs or organizations: Not on file    Relationship status: Not on file  . Intimate partner violence:    Fear of current or ex partner: Not on file    Emotionally abused: Not on file    Physically abused: Not on file    Forced sexual activity: Not on file  Other Topics Concern  . Not on file  Social History Narrative  . Not on file   Family History  Problem Relation Age of Onset  . Heart attack Mother   . Sudden Cardiac Death Neg Hx    Allergies  Allergen Reactions  . Norflex [Orphenadrine] Swelling  . Orphenadrine Citrate Swelling    Face swelling    Current Outpatient Medications  Medication Sig Dispense Refill  . aspirin EC 81 MG tablet Take 81 mg by mouth daily.    . furosemide (LASIX) 40 MG tablet Take 1 tablet (40 mg total) by mouth 2 (two) times daily. 34 tablet 3  . metoprolol succinate (TOPROL-XL) 50 MG 24 hr tablet Take 1 tablet (50 mg total) by mouth at bedtime. Take with or immediately following a meal. HF FUND 30 tablet 3  . omeprazole (PRILOSEC) 20 MG capsule Take 20 mg by mouth daily.    . sacubitril-valsartan (ENTRESTO) 24-26 MG Take 1 tablet by mouth 2 (two) times daily. 60 tablet 3  . spironolactone (ALDACTONE) 25 MG tablet Take 1 tablet (25 mg total) by mouth daily. 30 tablet 2  . potassium chloride SA (K-DUR,KLOR-CON) 20 MEQ tablet Take 1 tablet (20 mEq total) by mouth daily. (Patient not taking: Reported on 12/26/2017) 30 tablet 3   No current facility-administered medications for this encounter.     Vitals:    12/26/17 1200  BP: 96/84  Pulse: 96  SpO2: 100%  Weight: 66.2 kg (146 lb)     Wt Readings from Last 3 Encounters:  12/26/17 66.2 kg (146 lb)  12/13/17 68.2 kg (150 lb 6.4 oz)  12/05/17 66.6 kg (146 lb 12.8 oz)    REDS clip 35%   General: NAD Neck: JVP 9-10 cm, no thyromegaly or thyroid nodule.  Lungs: Clear to auscultation bilaterally with normal respiratory effort. CV: Nondisplaced PMI.  Heart  regular S1/S2, +S3, 2/6 HSM apex.  No peripheral edema.  No carotid bruit.  Normal pedal pulses.  Abdomen: Soft, nontender, no hepatosplenomegaly, no distention.  Skin: Intact without lesions or rashes.  Neurologic: Alert and oriented x 3.  Psych: Normal affect. Extremities: No clubbing or cyanosis.  HEENT: Normal.   ASSESSMENT & PLAN:  1. Chronic Systolic CHF:  Nonischemic cardiomyopathy, diagnosed in 2011, initially followed at [place]/[facility].  LHC (3/19) with no significant CAD.  CPX (3/19) submaximal, but suggestive of mild to moderate HF limitation. HIV negative 3/19, history of drug/ETOH abuse in the past, no drugs now and has cut back ETOH considerably.  Echo was done today and reviewed, EF 20% with severe LV dilation, severe MR.  NYHA class IIIb symptoms.  He is volume overloaded on exam. He has a wide LBBB.  He does not have an ICD, 2 subcutaneous ICD systems were removed for infection at [facility]. SBP soft in 90s today.  - Continue Toprol XL 50 daily, spironolactone 25 daily, and Entresto 24/26 bid. No BP room to titrate.  - I will add digoxin 0.125 daily today.  - I am worried about his trajectory with worsening symptoms and markedly low EF. I will arrange for RHC next week to assess filling pressures and cardiac output.  I discussed risks/benefits with patient and he agrees to the procedure.   - I am concerned that LBBB with dyssynchrony plays a significant role in his severe central MR (likely primarily functional).  Ideally, he would have CRT-D implantation.  However, he had infection of 2  subcutaneous ICD systems at [facility], with device explantations in 11/15 and 9/16. I will have him see EP to see whether he would be a CRT-D candidate. He is no longer using drugs and does not drink much at this point. This may help his MR.  If not, would consider Mitraclip (would need TEE).  - He may eventually be an LVAD candidate.  Will need to work on getting him on Medicaid. He will not be a transplant candidate at this time due to active smoking.  2. ETOH Abuse: Only occasional ETOH use now.   -  Discussed complete cessation  3. Tobacco abuse  -Discussed smoking cessation  4. Erectile dysfunction:   - OK to use low dose Viagra.  5. Mitral regurgitation: Severe MR on echo today.  MR appears central, and occurs in the setting of severe LV dilation as well as septal-lateral dyssynchrony with wide LBBB.  - CRT may decrease MR, but not sure he will be a candidate with prior device infections.  Have referred to EP.  - If not a CRT candidate, will need to workup for Mitraclip.  Will need TEE.  6. Referred to social worker to help him get Medicaid.   Marca Ancona 12/27/2017

## 2017-12-26 NOTE — Progress Notes (Signed)
  Echocardiogram 2D Echocardiogram has been performed.  Pieter Partridge 12/26/2017, 11:31 AM

## 2017-12-26 NOTE — Progress Notes (Signed)
CSW referred to assist patient with resources for insurance. Patient reports he was previously on medicaid while residing in [place] although reports recently denied from an application made in [facility]. He states that he now resides in [facility] and currently has food stamps but no insurance. CSW discussed pending application for SSI and encouraged patient to follow up to determine further needs for disability determination. Patient also shared application for [facility] program and plans to complete and submit. CSW provided information and encouraged follow up and return call if needed. Patient appreciative of assistance and will follow up if needed. Lasandra Beech, LCSW, CCSW-MCS [phone]

## 2017-12-27 ENCOUNTER — Ambulatory Visit (INDEPENDENT_AMBULATORY_CARE_PROVIDER_SITE_OTHER): Payer: Medicaid Other | Admitting: Internal Medicine

## 2017-12-27 ENCOUNTER — Encounter: Payer: Self-pay | Admitting: Internal Medicine

## 2017-12-27 VITALS — BP 92/76 | HR 93 | Ht 67.0 in | Wt 147.6 lb

## 2017-12-27 DIAGNOSIS — I447 Left bundle-branch block, unspecified: Secondary | ICD-10-CM

## 2017-12-27 DIAGNOSIS — I420 Dilated cardiomyopathy: Secondary | ICD-10-CM | POA: Diagnosis not present

## 2017-12-27 DIAGNOSIS — I5022 Chronic systolic (congestive) heart failure: Secondary | ICD-10-CM

## 2017-12-27 NOTE — Patient Instructions (Addendum)
Medication Instructions:  Your physician recommends that you continue on your current medications as directed. Please refer to the Current Medication list given to you today.  Labwork: None ordered.  Testing/Procedures: Your physician has recommended that you have a defibrillator inserted. An implantable cardioverter defibrillator (ICD) is a small device that is placed in your chest or, in rare cases, your abdomen. This device uses electrical pulses or shocks to help control life-threatening, irregular heartbeats that could lead the heart to suddenly stop beating (sudden cardiac arrest). Leads are attached to the ICD that goes into your heart. This is done in the hospital and usually requires an overnight stay. Please see the instruction sheet given to you today for more information.  Follow-Up: You will follow up with device clinic 10-14 days after your procedure for a wound check.  You will follow up with Dr. Ladona Ferguson 91 days after your procedure.    ICD INSTRUCTIONS:  Please arrive at the [facility] main entrance of [facility] hospital at:  5:30 am on 01/16/2018 Use the CHG surgical scrub Do not eat or drink after midnight prior to procedure You may take your morning medications with a sip of water except for:  LASIX, HCTZ and potassium Plan for one night stay You will need someone to drive you home at discharge  If you need a refill on your cardiac medications before your next appointment, please call your pharmacy.   Cardioverter Defibrillator Implantation An implantable cardioverter defibrillator (ICD) is a small device that is placed under the skin in the chest or abdomen. An ICD consists of a battery, a small computer (pulse generator), and wires (leads) that go into the heart. An ICD is used to detect and correct two types of dangerous irregular heartbeats (arrhythmias):  A rapid heart rhythm (tachycardia).  An arrhythmia in which the lower chambers of the heart (ventricles)  contract in an uncoordinated way (fibrillation).  When an ICD detects tachycardia, it sends a low-energy shock to the heart to restore the heartbeat to normal (cardioversion). This signal is usually painless. If cardioversion does not work or if the ICD detects fibrillation, it delivers a high-energy shock to the heart (defibrillation) to restart the heart. This shock may feel like a strong jolt in the chest. Your health care provider may prescribe an ICD if:  You have had an arrhythmia that originated in the ventricles.  Your heart has been damaged by a disease or heart condition.  Sometimes, ICDs are programmed to act as a device called a pacemaker. Pacemakers can be used to treat a slow heartbeat (bradycardia) or tachycardia by taking over the heart rate with electrical impulses. Tell a health care provider about:  Any allergies you have.  All medicines you are taking, including vitamins, herbs, eye drops, creams, and over-the-counter medicines.  Any problems you or family members have had with anesthetic medicines.  Any blood disorders you have.  Any surgeries you have had.  Any medical conditions you have.  Whether you are pregnant or may be pregnant. What are the risks? Generally, this is a safe procedure. However, problems may occur, including:  Swelling, bleeding, or bruising.  Infection.  Blood clots.  Damage to other structures or organs, such as nerves, blood vessels, or the heart.  Allergic reactions to medicines used during the procedure.  What happens before the procedure? Staying hydrated Follow instructions from your health care provider about hydration, which may include:  Up to 2 hours before the procedure - you may  continue to drink clear liquids, such as water, clear fruit juice, black coffee, and plain tea.  Eating and drinking restrictions Follow instructions from your health care provider about eating and drinking, which may include:  8 hours  before the procedure - stop eating heavy meals or foods such as meat, fried foods, or fatty foods.  6 hours before the procedure - stop eating light meals or foods, such as toast or cereal.  6 hours before the procedure - stop drinking milk or drinks that contain milk.  2 hours before the procedure - stop drinking clear liquids.  Medicine Ask your health care provider about:  Changing or stopping your normal medicines. This is important if you take diabetes medicines or blood thinners.  Taking medicines such as aspirin and ibuprofen. These medicines can thin your blood. Do not take these medicines before your procedure if your doctor tells you not to.  Tests  You may have blood tests.  You may have a test to check the electrical signals in your heart (electrocardiogram, ECG).  You may have imaging tests, such as a chest X-ray. General instructions  For 24 hours before the procedure, stop using products that contain nicotine or tobacco, such as cigarettes and e-cigarettes. If you need help quitting, ask your health care provider.  Plan to have someone take you home from the hospital or clinic.  You may be asked to shower with a germ-killing soap. What happens during the procedure?  To reduce your risk of infection: ? Your health care team will wash or sanitize their hands. ? Your skin will be washed with soap. ? Hair may be removed from the surgical area.  Small monitors will be put on your body. They will be used to check your heart, blood pressure, and oxygen level.  An IV tube will be inserted into one of your veins.  You will be given one or more of the following: ? A medicine to help you relax (sedative). ? A medicine to numb the area (local anesthetic). ? A medicine to make you fall asleep (general anesthetic).  Leads will be guided through a blood vessel into your heart and attached to your heart muscles. Depending on the ICD, the leads may go into one ventricle or  they may go into both ventricles and into an upper chamber of the heart. An X-ray machine (fluoroscope) will be usedto help guide the leads.  A small incision will be made to create a deep pocket under your skin.  The pulse generator will be placed into the pocket.  The ICD will be tested.  The incision will be closed with stitches (sutures), skin glue, or staples.  A bandage (dressing) will be placed over the incision. This procedure may vary among health care providers and hospitals. What happens after the procedure?  Your blood pressure, heart rate, breathing rate, and blood oxygen level will be monitored often until the medicines you were given have worn off.  A chest X-ray will be taken to check that the ICD is in the right place.  You will need to stay in the hospital for 1-2 days so your health care provider can make sure your ICD is working.  Do not drive for 24 hours if you received a sedative. Ask your health care provider when it is safe for you to drive.  You may be given an identification card explaining that you have an ICD. Summary  An implantable cardioverter defibrillator (ICD) is a small device  that is placed under the skin in the chest or abdomen. It is used to detect and correct dangerous irregular heartbeats (arrhythmias).  An ICD consists of a battery, a small computer (pulse generator), and wires (leads) that go into the heart.  When an ICD detects rapid heart rhythm (tachycardia), it sends a low-energy shock to the heart to restore the heartbeat to normal (cardioversion). If cardioversion does not work or if the ICD detects uncoordinated heart contractions (fibrillation), it delivers a high-energy shock to the heart (defibrillation) to restart the heart.  You will need to stay in the hospital for 1-2 days to make sure your ICD is working. This information is not intended to replace advice given to you by your health care provider. Make sure you discuss any  questions you have with your health care provider. Document Released: 12/23/2001 Document Revised: 04/11/2016 Document Reviewed: 04/11/2016 Elsevier Interactive Patient Education  2017 [employer].

## 2017-12-27 NOTE — Progress Notes (Signed)
HPI [name] is referred today by Dr. Shirlee Latch to consider insertion of a biv ICD. He is a pleasant 52 yo man with a longstanding non-ischemic CM, LBBB, who underwent ICD insertion several years ago initially complicated by high DFT's and then had a SubQ ICD placed which got infected. He may have had a second standard ICD infected as well. The patient has developed worsening CHF with LBBB and a QRS duration of 165. His CHF is class 3B.  Allergies  Allergen Reactions  . Norflex [Orphenadrine] Swelling  . Orphenadrine Citrate Swelling    Face swelling      Current Outpatient Medications  Medication Sig Dispense Refill  . albuterol (PROVENTIL HFA;VENTOLIN HFA) 108 (90 Base) MCG/ACT inhaler Inhale 2 puffs into the lungs every 6 (six) hours as needed for wheezing or shortness of breath.    Marland Kitchen aspirin EC 81 MG tablet Take 1 tablet (81 mg total) by mouth daily. 30 tablet 2  . digoxin (LANOXIN) 0.125 MG tablet Take 1 tablet (0.125 mg total) by mouth daily. 30 tablet 2  . furosemide (LASIX) 40 MG tablet Take 80 mg (2 tabs) in am and 40 mg (1 tab) in pm 90 tablet 2  . metoprolol succinate (TOPROL-XL) 50 MG 24 hr tablet Take 1 tablet (50 mg total) by mouth at bedtime. Take with or immediately following a meal. HF FUND 30 tablet 2  . omeprazole (PRILOSEC) 20 MG capsule Take 20 mg by mouth daily.    . potassium chloride SA (K-DUR,KLOR-CON) 20 MEQ tablet Take 2 tablets (40 mEq total) by mouth daily. 60 tablet 2  . sacubitril-valsartan (ENTRESTO) 24-26 MG Take 1 tablet by mouth 2 (two) times daily. 60 tablet 3  . spironolactone (ALDACTONE) 25 MG tablet Take 1 tablet (25 mg total) by mouth daily. 30 tablet 2   No current facility-administered medications for this visit.      Past Medical History:  Diagnosis Date  . CHF (congestive heart failure) (HCC)   . COPD (chronic obstructive pulmonary disease) (HCC)   . Nonischemic cardiomyopathy (HCC)    a. diagnosed in 2011 with EF 30% at that time  b. EF improved to 50% by echo in 02/2016    ROS:   All systems reviewed and negative except as noted in the HPI.   Past Surgical History:  Procedure Laterality Date  . LEFT HEART CATH AND CORONARY ANGIOGRAPHY N/A 06/26/2017   Procedure: LEFT HEART CATH AND CORONARY ANGIOGRAPHY;  Surgeon: [name], MD;  [facility] INVASIVE CV LAB;  Service: Cardiovascular;  Laterality: N/A;     Family History  Problem Relation Age of Onset  . Heart attack Mother   . Sudden Cardiac Death Neg Hx      Social History   Socioeconomic History  . Marital status: Single    Spouse name: Not on file  . Number of children: Not on file  . Years of education: Not on file  . Highest education level: Not on file  Occupational History  . Not on file  Social Needs  . Financial resource strain: Not on file  . Food insecurity:    Worry: Not on file    Inability: Not on file  . Transportation needs:    Medical: Not on file    Non-medical: Not on file  Tobacco Use  . Smoking status: Current Some Day Smoker  . Smokeless tobacco: Never Used  Substance and Sexual Activity  . Alcohol use: Yes  Frequency: Never    Comment: 3-4 beers daily  . Drug use: No  . Sexual activity: Not on file  Lifestyle  . Physical activity:    Days per week: Not on file    Minutes per session: Not on file  . Stress: Not on file  Relationships  . Social connections:    Talks on phone: Not on file    Gets together: Not on file    Attends religious service: Not on file    Active member of club or organization: Not on file    Attends meetings of clubs or organizations: Not on file    Relationship status: Not on file  . Intimate partner violence:    Fear of current or ex partner: Not on file    Emotionally abused: Not on file    Physically abused: Not on file    Forced sexual activity: Not on file  Other Topics Concern  . Not on file  Social History Narrative  . Not on file     BP 92/76   Pulse 93   Ht  5\' 7"  (1.702 m)   Wt 147 lb 9.6 oz (67 kg)   SpO2 98%   BMI 23.12 kg/m   Physical Exam:  Well appearing NAD HEENT: Unremarkable Neck:  No JVD, no thyromegally Lymphatics:  No adenopathy Back:  No CVA tenderness Lungs:  Clear with no wheezes HEART:  Regular rate rhythm, no murmurs, no rubs, no clicks Abd:  soft, positive bowel sounds, no organomegally, no rebound, no guarding Ext:  2 plus pulses, no edema, no cyanosis, no clubbing Skin:  No rashes no nodules Neuro:  CN II through XII intact, motor grossly intact  EKG - reviewed. NSR with LBBB  DEVICE  Normal device function.  See PaceArt for details.   Assess/Plan: 1. Chronic systolic heart failure - his symptoms are class 3B. I have discussed the indications/risks/benefits/goals/expectations of BiV ICD insertion and he wishes to proceed.  Leonia Reeves.D.

## 2017-12-30 ENCOUNTER — Inpatient Hospital Stay (HOSPITAL_COMMUNITY)
Admission: RE | Admit: 2017-12-30 | Discharge: 2018-01-11 | DRG: 222 | Disposition: A | Discharge status: Home Health | Payer: Medicaid Other | Source: Ambulatory Visit | Attending: Cardiology | Admitting: Cardiology

## 2017-12-30 ENCOUNTER — Inpatient Hospital Stay (HOSPITAL_COMMUNITY)
Admission: RE | Disposition: A | Discharge status: Home Health | Payer: Self-pay | Source: Ambulatory Visit | Attending: Cardiology

## 2017-12-30 ENCOUNTER — Encounter (HOSPITAL_COMMUNITY): Payer: Self-pay | Admitting: Cardiology

## 2017-12-30 ENCOUNTER — Inpatient Hospital Stay: Payer: Self-pay

## 2017-12-30 ENCOUNTER — Other Ambulatory Visit: Payer: Self-pay

## 2017-12-30 DIAGNOSIS — Z7982 Long term (current) use of aspirin: Secondary | ICD-10-CM | POA: Diagnosis not present

## 2017-12-30 DIAGNOSIS — Z01818 Encounter for other preprocedural examination: Secondary | ICD-10-CM

## 2017-12-30 DIAGNOSIS — E875 Hyperkalemia: Secondary | ICD-10-CM | POA: Diagnosis not present

## 2017-12-30 DIAGNOSIS — I420 Dilated cardiomyopathy: Secondary | ICD-10-CM | POA: Diagnosis present

## 2017-12-30 DIAGNOSIS — Z720 Tobacco use: Secondary | ICD-10-CM

## 2017-12-30 DIAGNOSIS — Z888 Allergy status to other drugs, medicaments and biological substances status: Secondary | ICD-10-CM

## 2017-12-30 DIAGNOSIS — Z23 Encounter for immunization: Secondary | ICD-10-CM | POA: Diagnosis not present

## 2017-12-30 DIAGNOSIS — R6521 Severe sepsis with septic shock: Secondary | ICD-10-CM | POA: Diagnosis not present

## 2017-12-30 DIAGNOSIS — J449 Chronic obstructive pulmonary disease, unspecified: Secondary | ICD-10-CM | POA: Diagnosis present

## 2017-12-30 DIAGNOSIS — Z7189 Other specified counseling: Secondary | ICD-10-CM | POA: Diagnosis not present

## 2017-12-30 DIAGNOSIS — K029 Dental caries, unspecified: Secondary | ICD-10-CM | POA: Diagnosis present

## 2017-12-30 DIAGNOSIS — R509 Fever, unspecified: Secondary | ICD-10-CM

## 2017-12-30 DIAGNOSIS — F17210 Nicotine dependence, cigarettes, uncomplicated: Secondary | ICD-10-CM | POA: Diagnosis present

## 2017-12-30 DIAGNOSIS — I5021 Acute systolic (congestive) heart failure: Secondary | ICD-10-CM | POA: Diagnosis present

## 2017-12-30 DIAGNOSIS — I4892 Unspecified atrial flutter: Secondary | ICD-10-CM | POA: Diagnosis not present

## 2017-12-30 DIAGNOSIS — R6520 Severe sepsis without septic shock: Secondary | ICD-10-CM | POA: Diagnosis not present

## 2017-12-30 DIAGNOSIS — I5022 Chronic systolic (congestive) heart failure: Secondary | ICD-10-CM

## 2017-12-30 DIAGNOSIS — R05 Cough: Secondary | ICD-10-CM

## 2017-12-30 DIAGNOSIS — I340 Nonrheumatic mitral (valve) insufficiency: Secondary | ICD-10-CM | POA: Diagnosis present

## 2017-12-30 DIAGNOSIS — R008 Other abnormalities of heart beat: Secondary | ICD-10-CM | POA: Diagnosis not present

## 2017-12-30 DIAGNOSIS — D696 Thrombocytopenia, unspecified: Secondary | ICD-10-CM | POA: Diagnosis not present

## 2017-12-30 DIAGNOSIS — I428 Other cardiomyopathies: Secondary | ICD-10-CM | POA: Diagnosis not present

## 2017-12-30 DIAGNOSIS — I959 Hypotension, unspecified: Secondary | ICD-10-CM | POA: Diagnosis not present

## 2017-12-30 DIAGNOSIS — I5023 Acute on chronic systolic (congestive) heart failure: Principal | ICD-10-CM | POA: Diagnosis present

## 2017-12-30 DIAGNOSIS — Z95810 Presence of automatic (implantable) cardiac defibrillator: Secondary | ICD-10-CM

## 2017-12-30 DIAGNOSIS — A419 Sepsis, unspecified organism: Secondary | ICD-10-CM | POA: Diagnosis not present

## 2017-12-30 DIAGNOSIS — Z716 Tobacco abuse counseling: Secondary | ICD-10-CM | POA: Diagnosis not present

## 2017-12-30 DIAGNOSIS — R6883 Chills (without fever): Secondary | ICD-10-CM | POA: Diagnosis not present

## 2017-12-30 DIAGNOSIS — R000 Tachycardia, unspecified: Secondary | ICD-10-CM | POA: Diagnosis not present

## 2017-12-30 DIAGNOSIS — E871 Hypo-osmolality and hyponatremia: Secondary | ICD-10-CM | POA: Diagnosis present

## 2017-12-30 DIAGNOSIS — Z01810 Encounter for preprocedural cardiovascular examination: Secondary | ICD-10-CM | POA: Diagnosis not present

## 2017-12-30 DIAGNOSIS — I447 Left bundle-branch block, unspecified: Secondary | ICD-10-CM | POA: Diagnosis present

## 2017-12-30 DIAGNOSIS — K045 Chronic apical periodontitis: Secondary | ICD-10-CM | POA: Diagnosis present

## 2017-12-30 DIAGNOSIS — F1010 Alcohol abuse, uncomplicated: Secondary | ICD-10-CM | POA: Diagnosis present

## 2017-12-30 DIAGNOSIS — I509 Heart failure, unspecified: Secondary | ICD-10-CM

## 2017-12-30 DIAGNOSIS — R059 Cough, unspecified: Secondary | ICD-10-CM

## 2017-12-30 DIAGNOSIS — Z8249 Family history of ischemic heart disease and other diseases of the circulatory system: Secondary | ICD-10-CM

## 2017-12-30 DIAGNOSIS — Z515 Encounter for palliative care: Secondary | ICD-10-CM | POA: Diagnosis not present

## 2017-12-30 DIAGNOSIS — Z79899 Other long term (current) drug therapy: Secondary | ICD-10-CM | POA: Diagnosis not present

## 2017-12-30 DIAGNOSIS — Z8619 Personal history of other infectious and parasitic diseases: Secondary | ICD-10-CM | POA: Diagnosis not present

## 2017-12-30 DIAGNOSIS — I255 Ischemic cardiomyopathy: Secondary | ICD-10-CM | POA: Diagnosis not present

## 2017-12-30 DIAGNOSIS — F1911 Other psychoactive substance abuse, in remission: Secondary | ICD-10-CM | POA: Diagnosis not present

## 2017-12-30 HISTORY — DX: Dyspnea, unspecified: R06.00

## 2017-12-30 HISTORY — PX: RIGHT HEART CATH: CATH118263

## 2017-12-30 LAB — COMPREHENSIVE METABOLIC PANEL
ALT: 12 U/L (ref 0–44)
AST: 17 U/L (ref 15–41)
Albumin: 3.8 g/dL (ref 3.5–5.0)
Alkaline Phosphatase: 97 U/L (ref 38–126)
Anion gap: 9 (ref 5–15)
BUN: 17 mg/dL (ref 6–20)
CHLORIDE: 107 mmol/L (ref 98–111)
CO2: 19 mmol/L — AB (ref 22–32)
CREATININE: 1.03 mg/dL (ref 0.61–1.24)
Calcium: 9.3 mg/dL (ref 8.9–10.3)
GFR calc Af Amer: >60 mL/min (ref >60)
GFR calc non Af Amer: >60 mL/min (ref >60)
Glucose, Bld: 114 mg/dL — ABNORMAL HIGH (ref 70–99)
Potassium: 4.8 mmol/L (ref 3.5–5.1)
Sodium: 135 mmol/L (ref 135–145)
Total Bilirubin: 0.9 mg/dL (ref 0.3–1.2)
Total Protein: 6.8 g/dL (ref 6.5–8.1)

## 2017-12-30 LAB — POCT I-STAT 3, VENOUS BLOOD GAS (G3P V)
ACID-BASE DEFICIT: 2 mmol/L (ref 0.0–2.0)
Acid-base deficit: 2 mmol/L (ref 0.0–2.0)
BICARBONATE: 22.6 mmol/L (ref 20.0–28.0)
BICARBONATE: 22.6 mmol/L (ref 20.0–28.0)
O2 SAT: 56 %
O2 Saturation: 54 %
PO2 VEN: 29 mmHg — AB (ref 32.0–45.0)
TCO2: 24 mmol/L (ref 22–32)
TCO2: 24 mmol/L (ref 22–32)
pCO2, Ven: 36.9 mmHg — ABNORMAL LOW (ref 44.0–60.0)
pCO2, Ven: 37.1 mmHg — ABNORMAL LOW (ref 44.0–60.0)
pH, Ven: 7.393 (ref 7.250–7.430)
pH, Ven: 7.396 (ref 7.250–7.430)
pO2, Ven: 29 mmHg — CL (ref 32.0–45.0)

## 2017-12-30 LAB — CBC WITH DIFFERENTIAL/PLATELET
ABS IMMATURE GRANULOCYTES: 0 10*3/uL (ref 0.0–0.1)
BASOS PCT: 0 %
Basophils Absolute: 0 10*3/uL (ref 0.0–0.1)
Eosinophils Absolute: 0.1 10*3/uL (ref 0.0–0.7)
Eosinophils Relative: 1 %
HCT: 44.6 % (ref 39.0–52.0)
Hemoglobin: 15.7 g/dL (ref 13.0–17.0)
IMMATURE GRANULOCYTES: 0 %
LYMPHS PCT: 28 %
Lymphs Abs: 2.7 10*3/uL (ref 0.7–4.0)
MCH: 33.1 pg (ref 26.0–34.0)
MCHC: 35.2 g/dL (ref 30.0–36.0)
MCV: 94.1 fL (ref 78.0–100.0)
MONOS PCT: 11 %
Monocytes Absolute: 1.1 10*3/uL — ABNORMAL HIGH (ref 0.1–1.0)
NEUTROS ABS: 5.8 10*3/uL (ref 1.7–7.7)
NEUTROS PCT: 60 %
PLATELETS: 210 10*3/uL (ref 150–400)
RBC: 4.74 MIL/uL (ref 4.22–5.81)
RDW: 13.6 % (ref 11.5–15.5)
WBC: 9.8 10*3/uL (ref 4.0–10.5)

## 2017-12-30 LAB — TSH: TSH: 1.436 u[IU]/mL (ref 0.350–4.500)

## 2017-12-30 LAB — MRSA PCR SCREENING: MRSA by PCR: NEGATIVE

## 2017-12-30 LAB — BRAIN NATRIURETIC PEPTIDE: B Natriuretic Peptide: 4087 pg/mL — ABNORMAL HIGH (ref 0.0–100.0)

## 2017-12-30 LAB — DIGOXIN LEVEL: Digoxin Level: <0.2 ng/mL — ABNORMAL LOW (ref 0.8–2.0)

## 2017-12-30 SURGERY — RIGHT HEART CATH
Anesthesia: LOCAL

## 2017-12-30 MED ORDER — ASPIRIN EC 81 MG PO TBEC
81.0000 mg | DELAYED_RELEASE_TABLET | Freq: Every day | ORAL | Status: DC
  Administered 2017-12-31 – 2018-01-11 (×11): 81 mg via ORAL
  Filled 2017-12-30 (×11): qty 1

## 2017-12-30 MED ORDER — SODIUM CHLORIDE 0.9% FLUSH: 3.0000 mL | INTRAVENOUS | Status: DC | PRN

## 2017-12-30 MED ORDER — POTASSIUM CHLORIDE CRYS ER 20 MEQ PO TBCR
40.0000 meq | EXTENDED_RELEASE_TABLET | Freq: Every day | ORAL | Status: DC
  Administered 2017-12-31 – 2018-01-05 (×6): 40 meq via ORAL
  Filled 2017-12-30 (×6): qty 2

## 2017-12-30 MED ORDER — MILRINONE LACTATE IN DEXTROSE 20-5 MG/100ML-% IV SOLN: 0.2500 ug/kg/min | INTRAVENOUS | Status: DC

## 2017-12-30 MED ORDER — MILRINONE LACTATE IN DEXTROSE 20-5 MG/100ML-% IV SOLN
0.1250 ug/kg/min | INTRAVENOUS | Status: DC
  Administered 2017-12-30 – 2018-01-02 (×4): 0.25 ug/kg/min via INTRAVENOUS
  Administered 2018-01-03 – 2018-01-05 (×2): 0.125 ug/kg/min via INTRAVENOUS
  Filled 2017-12-30 (×7): qty 100

## 2017-12-30 MED ORDER — SODIUM CHLORIDE 0.9% FLUSH
10.0000 mL | Freq: Two times a day (BID) | INTRAVENOUS | Status: DC
  Administered 2017-12-30 – 2018-01-11 (×8): 10 mL

## 2017-12-30 MED ORDER — METOPROLOL SUCCINATE ER 25 MG PO TB24: 25.0000 mg | ORAL_TABLET | Freq: Every day | ORAL | Status: DC

## 2017-12-30 MED ORDER — FUROSEMIDE 40 MG PO TABS
40.0000 mg | ORAL_TABLET | Freq: Every evening | ORAL | Status: DC
  Administered 2017-12-30 – 2018-01-02 (×4): 40 mg via ORAL
  Filled 2017-12-30 (×4): qty 1

## 2017-12-30 MED ORDER — ALBUTEROL SULFATE (2.5 MG/3ML) 0.083% IN NEBU: 3.0000 mL | INHALATION_SOLUTION | Freq: Four times a day (QID) | RESPIRATORY_TRACT | Status: DC | PRN

## 2017-12-30 MED ORDER — SODIUM CHLORIDE 0.9 % IV SOLN
INTRAVENOUS | Status: DC
  Administered 2017-12-30: 07:00:00 via INTRAVENOUS

## 2017-12-30 MED ORDER — MIDAZOLAM HCL 2 MG/2ML IJ SOLN
INTRAMUSCULAR | Status: DC | PRN
  Administered 2017-12-30: 0.5 mg via INTRAVENOUS

## 2017-12-30 MED ORDER — SACUBITRIL-VALSARTAN 24-26 MG PO TABS
1.0000 | ORAL_TABLET | Freq: Two times a day (BID) | ORAL | Status: DC
  Administered 2017-12-30 – 2018-01-05 (×12): 1 via ORAL
  Filled 2017-12-30 (×13): qty 1

## 2017-12-30 MED ORDER — FENTANYL CITRATE (PF) 100 MCG/2ML IJ SOLN
INTRAMUSCULAR | Status: DC | PRN
  Administered 2017-12-30: 25 ug via INTRAVENOUS

## 2017-12-30 MED ORDER — ONDANSETRON HCL 4 MG/2ML IJ SOLN: 4.0000 mg | Freq: Four times a day (QID) | INTRAMUSCULAR | Status: DC | PRN

## 2017-12-30 MED ORDER — SODIUM CHLORIDE 0.9% FLUSH: 3.0000 mL | Freq: Two times a day (BID) | INTRAVENOUS | Status: DC

## 2017-12-30 MED ORDER — SODIUM CHLORIDE 0.9% FLUSH: 10.0000 mL | INTRAVENOUS | Status: DC | PRN

## 2017-12-30 MED ORDER — FENTANYL CITRATE (PF) 100 MCG/2ML IJ SOLN
INTRAMUSCULAR | Status: AC
  Filled 2017-12-30: qty 2

## 2017-12-30 MED ORDER — INFLUENZA VAC SPLIT QUAD 0.5 ML IM SUSY
0.5000 mL | PREFILLED_SYRINGE | INTRAMUSCULAR | Status: AC
  Administered 2017-12-31: 0.5 mL via INTRAMUSCULAR
  Filled 2017-12-30: qty 0.5

## 2017-12-30 MED ORDER — FUROSEMIDE 80 MG PO TABS
80.0000 mg | ORAL_TABLET | Freq: Every day | ORAL | Status: DC
Start: 2017-12-30 — End: 2018-01-06
  Administered 2017-12-30 – 2018-01-05 (×6): 80 mg via ORAL
  Filled 2017-12-30 (×7): qty 1

## 2017-12-30 MED ORDER — SODIUM CHLORIDE 0.9% FLUSH
3.0000 mL | Freq: Two times a day (BID) | INTRAVENOUS | Status: DC
  Administered 2017-12-30 – 2018-01-10 (×14): 3 mL via INTRAVENOUS
  Administered 2018-01-11: 10 mL via INTRAVENOUS

## 2017-12-30 MED ORDER — PANTOPRAZOLE SODIUM 40 MG PO TBEC
40.0000 mg | DELAYED_RELEASE_TABLET | Freq: Every day | ORAL | Status: DC
  Administered 2017-12-30 – 2018-01-11 (×12): 40 mg via ORAL
  Filled 2017-12-30 (×12): qty 1

## 2017-12-30 MED ORDER — SODIUM CHLORIDE 0.9% FLUSH
3.0000 mL | INTRAVENOUS | Status: DC | PRN
  Administered 2018-01-05: 3 mL via INTRAVENOUS
  Filled 2017-12-30: qty 3

## 2017-12-30 MED ORDER — DIGOXIN 125 MCG PO TABS
0.1250 mg | ORAL_TABLET | Freq: Every day | ORAL | Status: DC
  Administered 2017-12-30 – 2018-01-11 (×13): 0.125 mg via ORAL
  Filled 2017-12-30 (×13): qty 1

## 2017-12-30 MED ORDER — MIDAZOLAM HCL 2 MG/2ML IJ SOLN
INTRAMUSCULAR | Status: AC
  Filled 2017-12-30: qty 2

## 2017-12-30 MED ORDER — SODIUM CHLORIDE 0.9 % IV SOLN: 250.0000 mL | INTRAVENOUS | Status: DC | PRN

## 2017-12-30 MED ORDER — ENOXAPARIN SODIUM 40 MG/0.4ML ~~LOC~~ SOLN
40.0000 mg | SUBCUTANEOUS | Status: DC
  Administered 2017-12-30 – 2018-01-11 (×7): 40 mg via SUBCUTANEOUS
  Filled 2017-12-30 (×12): qty 0.4

## 2017-12-30 MED ORDER — ASPIRIN 81 MG PO CHEW
81.0000 mg | CHEWABLE_TABLET | ORAL | Status: AC
  Administered 2017-12-30: 81 mg via ORAL
  Filled 2017-12-30: qty 1

## 2017-12-30 MED ORDER — SPIRONOLACTONE 25 MG PO TABS
25.0000 mg | ORAL_TABLET | Freq: Every day | ORAL | Status: DC
  Administered 2017-12-31 – 2018-01-05 (×6): 25 mg via ORAL
  Filled 2017-12-30 (×7): qty 1

## 2017-12-30 MED ORDER — ACETAMINOPHEN 325 MG PO TABS
650.0000 mg | ORAL_TABLET | ORAL | Status: DC | PRN
  Administered 2017-12-31: 650 mg via ORAL
  Filled 2017-12-30: qty 2

## 2017-12-30 MED ORDER — HEPARIN (PORCINE) IN NACL 1000-0.9 UT/500ML-% IV SOLN
INTRAVENOUS | Status: AC
  Filled 2017-12-30: qty 500

## 2017-12-30 MED ORDER — LIDOCAINE HCL (PF) 1 % IJ SOLN
INTRAMUSCULAR | Status: AC
  Filled 2017-12-30: qty 30

## 2017-12-30 SURGICAL SUPPLY — 10 items
CATH BALLN WEDGE 5F 110CM (CATHETERS) ×2 IMPLANT
GUIDEWIRE .025 260CM (WIRE) ×2 IMPLANT
KIT HEART LEFT (KITS) ×2 IMPLANT
PACK CARDIAC CATHETERIZATION (CUSTOM PROCEDURE TRAY) ×2 IMPLANT
PROTECTION STATION PRESSURIZED (MISCELLANEOUS) ×2
SHEATH GLIDE SLENDER 4/5FR (SHEATH) ×2 IMPLANT
STATION PROTECTION PRESSURIZED (MISCELLANEOUS) ×1 IMPLANT
TRANSDUCER W/STOPCOCK (MISCELLANEOUS) ×2 IMPLANT
TUBING CIL FLEX 10 FLL-RA (TUBING) ×2 IMPLANT
WIRE MICROINTRODUCER 60CM (WIRE) ×2 IMPLANT

## 2017-12-30 NOTE — Interval H&P Note (Signed)
History and Physical Interval Note:  12/30/2017 7:54 AM  Carl Ferguson  has presented today for surgery, with the diagnosis of Heart Failure  The various methods of treatment have been discussed with the patient and family. After consideration of risks, benefits and other options for treatment, the patient has consented to  Procedure(s): RIGHT HEART CATH (N/A) as a surgical intervention .  The patient's history has been reviewed, patient examined, no change in status, stable for surgery.  I have reviewed the patient's chart and labs.  Questions were answered to the patient's satisfaction.     Carl Ferguson [employer]

## 2017-12-30 NOTE — Progress Notes (Signed)
Patient ID: Carl Ferguson, male   DOB: 03/02/1966, 52 y.o.   MRN: [id number]  EP Attending  I saw [name] in consultation 3 days ago as an outpatient and he has come in for right heart cath which demonstrates a very low cardiac output with near normal filling pressures. We will plan a right sided biv ICD on Thursday allowing him 3 days of IV milrinone.   Please do not put a PICC line on the right side as it would markedly increase the risk of infection if placed on the same side as his ICD.  Also, no electrodes on the right upper chest  Leonia Reeves.D.

## 2017-12-30 NOTE — H&P (Addendum)
Advanced Heart Failure H&P Note   PCP: Patient, No Pcp Per  PCP-Cardiologist: Garwin Brothers, MD  HF Cardiology: Dr. Shirlee Latch  HPI:  Carl Ferguson is a 52 y.o. male with systolic CHF, NICM, Chronic LBBB, Tobacco abuse, prior ETOH abuse, prior substance abuse, and h/o of device placements with extractions due to infection.   Pt has long complicated history of CHF.  Care was initially in [place] and at [facility].  Previously had EF down to 30% in 2011 at initial diagnosis.  In 4/13, he got a single chamber ICD.  In 9/13, he had implantation of a subcutaneous array for high DFTs while on amiodarone for VT.  Echo in 11/15 with EF 30%.  In 11/15, single chamber ICD, leads, and subcutaneous array all removed due to infection.  Discharged with [facility]. In 1/16, he had subcutaneous ICD placed.  In 9/16, this was explanted due to erosion.  EF on echo in 9/16 was 45%.  Echo in 11/17 showed EF 50%.    Admitted to [facility] 2/28 - 06/17/17 with SOB, orthopnea, and PND. He was off medicines for about 6 weeks prior due to insurance issues. Repeat Echo (3/19) significant for fall in EF to 15-20% with severe LV dilation and severe diffuse hypokinesis, moderate-severe MR.   Followed up with Dr. Tomie China 06/24/17. Feeling better at this appointment. Referred for LHC and HF team. Dr Excell Seltzer performed [facility] March 2019. No significant CAD.  CPX 3/19 showed peak VO2 24 (68% predicted) with VE/VCO2 slope 37 with RER 0.91 (submaximal).  Probably mild to moderate functional impairment.   Echo 12/26/17 showed EF is around 20% with severe dilation, severe central mitral regurgitation. Severe LAE.   Seen in clinic 12/26/17. At previous visit, Lasix was increased to 40 mg twice a day. Pt was Having ongoing cough. SOB with exertion just walking around his house. Denies PND. + Orthopnea, sleeps in a recliner.  Weight had gone down from 150 to 146 pounds in setting of poor appetite. Taking all medications except potassium. Usually smokes 1  pack cigarettes every 2 days. He has difficulty paying for medications.  Occasional ETOH use, no drugs.    He presented today for scheduled cath which showed low output heart failure. Pt will be admitted for milrinone initiation and EP consideration.   RHC 12/30/17 RA mean 3 RV 35/6 PA 41/20, mean 30 PCWP mean 16  Oxygen saturations: PA 56% AO 97%  Cardiac Output (Fick) 2.5  Cardiac Index (Fick) 1.4 PVR 5.6 WU  Social History   Socioeconomic History  . Marital status: Single    Spouse name: Not on file  . Number of children: Not on file  . Years of education: Not on file  . Highest education level: Not on file  Occupational History  . Not on file  Social Needs  . Financial resource strain: Not on file  . Food insecurity:    Worry: Not on file    Inability: Not on file  . Transportation needs:    Medical: Not on file    Non-medical: Not on file  Tobacco Use  . Smoking status: Current Some Day Smoker  . Smokeless tobacco: Never Used  Substance and Sexual Activity  . Alcohol use: Yes    Frequency: Never    Comment: 3-4 beers daily  . Drug use: No  . Sexual activity: Not on file  Lifestyle  . Physical activity:    Days per week: Not on file    Minutes per  session: Not on file  . Stress: Not on file  Relationships  . Social connections:    Talks on phone: Not on file    Gets together: Not on file    Attends religious service: Not on file    Active member of club or organization: Not on file    Attends meetings of clubs or organizations: Not on file    Relationship status: Not on file  . Intimate partner violence:    Fear of current or ex partner: Not on file    Emotionally abused: Not on file    Physically abused: Not on file    Forced sexual activity: Not on file  Other Topics Concern  . Not on file  Social History Narrative  . Not on file   Family History  Problem Relation Age of Onset  . Heart attack Mother   . Sudden Cardiac Death Neg Hx     Allergies  Allergen Reactions  . Norflex [Orphenadrine] Swelling  . Orphenadrine Citrate Swelling    Face swelling    Current Facility-Administered Medications  Medication Dose Route Frequency Provider Last Rate Last Dose  . 0.9 %  sodium chloride infusion  250 mL Intravenous PRN Laurey Morale, MD      . 0.9 %  sodium chloride infusion   Intravenous Continuous Laurey Morale, MD 10 mL/hr at 12/30/17 [phone]    . sodium chloride flush (NS) 0.9 % injection 3 mL  3 mL Intravenous Q12H Laurey Morale, MD      . sodium chloride flush (NS) 0.9 % injection 3 mL  3 mL Intravenous PRN Laurey Morale, MD        Vitals:   12/30/17 0815 12/30/17 0820 12/30/17 0825 12/30/17 0830  BP: 97/71     Pulse: 91 (!) 0 (!) 0 (!) 295  Resp: (!) 4 (!) 0 19 (!) 0  SpO2: 96% 97% (!) 0% (!) 0%  Weight:      Height:         Wt Readings from Last 3 Encounters:  12/30/17 67.1 kg  12/27/17 67 kg  12/26/17 66.2 kg    Physical Exam General: NAD Neck: JVP 9-10 cm, no thyromegaly or thyroid nodule.  Lungs: Clear to auscultation bilaterally with normal respiratory effort. CV: Nondisplaced PMI.  Heart regular S1/S2, +S3, 2/6 HSM apex.  No peripheral edema.  No carotid bruit.  Normal pedal pulses.  Abdomen: Soft, nontender, no hepatosplenomegaly, no distention.  Skin: Intact without lesions or rashes.  Neurologic: Alert and oriented x 3.  Psych: Normal affect. Extremities: No clubbing or cyanosis.  HEENT: Normal.   ASSESSMENT & PLAN:  1. Chronic Systolic CHF:  - Nonischemic cardiomyopathy, diagnosed in 2011, initially followed at [facility].  LHC (3/19) with no significant CAD.  CPX (3/19) submaximal, but suggestive of mild to moderate HF limitation. HIV negative 3/19, history of drug/ETOH abuse in the past, no drugs now and has cut back ETOH considerably.  Echo was done today and reviewed, EF 20% with severe LV dilation, severe MR.  NYHA class IIIb symptoms.  He is volume overloaded on exam. He has  a wide LBBB.  He does not have an ICD, 2 subcutaneous ICD systems were removed for infection at [facility].  - Low output HF on cath asa above.  - Will admit to stepdown for CVP, Coox, PICC line, and milrinone.  - Continue spironolactone 25 daily, and Entresto 24/26 bid. No BP room to titrate.  - Decrease  Toprol XL to 25 mg daily with low output.  - Continue digoxin 0.125 daily. - There is some concern that LBBB with dyssynchrony plays a significant role in his severe central MR (likely primarily functional).  Ideally, he would have CRT-D implantation.  However, he had infection of 2 subcutaneous ICD systems at [facility], with device explantations in 11/15 and 9/16. EP to see this admission. He is no longer using drugs and does not drink much at this point. This may help his MR.  If not, would consider Mitraclip (would need TEE).  - He may eventually be an LVAD candidate.  Will need to work on getting him on Medicaid. He will not be a transplant candidate at this time due to active smoking.  2. ETOH Abuse:  - Encouraged complete cessation  3. Tobacco abuse  - Encouraged complete cessation  4. Mitral regurgitation:  - Severe MR on echo 12/26/17 MR appears central, and occurs in the setting of severe LV dilation as well as septal-lateral dyssynchrony with wide LBBB.  - CRT may decrease MR, but not sure he will be a candidate with prior device infections. EP to see this admission.  - If not a CRT candidate, will need to workup for Mitraclip.  Will need TEE.  5. Social - Have referred to social worker to help him get Medicaid.   Graciella Freer, PA-C  12/30/2017  Advanced Heart Failure Team Pager [phone] (M-F; 7a - 4p)  Please contact [facility] for night-coverage after hours (4p -7a ) and weekends on [email or web address]  Patient seen with PA, agree with the above note.  He will need milrinone based on RHC today.  Will start milrinone 0.25 and place PICC line on left.  Will follow co-ox and CVP off line.   Volume status looks ok.   Discussed with EP, will plan CRT-D device on Thursday on right side.    If he does not have significant improvement with CRT-D, would consider LVAD.   Marca Ancona 12/30/2017 12:52 PM

## 2017-12-30 NOTE — Progress Notes (Signed)
Peripherally Inserted Central Catheter/Midline Placement  The IV Nurse has discussed with the patient and/or persons authorized to consent for the patient, the purpose of this procedure and the potential benefits and risks involved with this procedure.  The benefits include less needle sticks, lab draws from the catheter, and the patient may be discharged home with the catheter. Risks include, but not limited to, infection, bleeding, blood clot (thrombus formation), and puncture of an artery; nerve damage and irregular heartbeat and possibility to perform a PICC exchange if needed/ordered by physician.  Alternatives to this procedure were also discussed.  Bard Power PICC patient education guide, fact sheet on infection prevention and patient information card has been provided to patient /or left at bedside.    PICC/Midline Placement Documentation  PICC Double Lumen 12/30/17 PICC Left Basilic 44 cm 0 cm (Active)  Indication for Insertion or Continuance of Line Vasoactive infusions 12/30/2017  8:40 PM  Exposed Catheter (cm) 0 cm 12/30/2017  8:40 PM  Site Assessment Clean;Dry;Intact 12/30/2017  8:40 PM  Lumen #1 Status Flushed;Saline locked;Blood return noted 12/30/2017  8:40 PM  Lumen #2 Status Flushed;Saline locked;Blood return noted 12/30/2017  8:40 PM  Dressing Type Transparent 12/30/2017  8:40 PM  Dressing Status Clean;Dry;Intact;Antimicrobial disc in place 12/30/2017  8:40 PM  Dressing Change Due 01/06/18 12/30/2017  8:40 PM       Ethelda Chick 12/30/2017, 8:41 PM

## 2017-12-31 ENCOUNTER — Inpatient Hospital Stay (HOSPITAL_COMMUNITY): Payer: Medicaid Other

## 2017-12-31 LAB — COOXEMETRY PANEL
Carboxyhemoglobin: 1.5 % (ref 0.5–1.5)
Methemoglobin: 1.5 % (ref 0.0–1.5)
O2 SAT: 67.5 %
Total hemoglobin: 16.1 g/dL — ABNORMAL HIGH (ref 12.0–16.0)

## 2017-12-31 LAB — BASIC METABOLIC PANEL
Anion gap: 11 (ref 5–15)
BUN: 18 mg/dL (ref 6–20)
CO2: 25 mmol/L (ref 22–32)
CREATININE: 1.13 mg/dL (ref 0.61–1.24)
Calcium: 8.9 mg/dL (ref 8.9–10.3)
Chloride: 98 mmol/L (ref 98–111)
GFR calc non Af Amer: >60 mL/min (ref >60)
Glucose, Bld: 157 mg/dL — ABNORMAL HIGH (ref 70–99)
Potassium: 3.7 mmol/L (ref 3.5–5.1)
SODIUM: 134 mmol/L — AB (ref 135–145)

## 2017-12-31 MED FILL — Heparin Sod (Porcine)-NaCl IV Soln 1000 Unit/500ML-0.9%: INTRAVENOUS | Qty: 500 | Status: AC

## 2017-12-31 MED FILL — Lidocaine HCl Local Preservative Free (PF) Inj 1%: INTRAMUSCULAR | Qty: 30 | Status: AC

## 2017-12-31 NOTE — Progress Notes (Addendum)
Advanced Heart Failure Rounding Note  PCP-Cardiologist: Garwin Brothers, MD   Subjective:    Started on milrinone 0.25 mcg/kg/min yesterday for low output on cath. Coox 68% this am.  Weight down 5 lbs. CVP 5-6. On PO lasix. Creatinine stable 1.13.  Feels much better today. Denies CP, SOB, orthopnea. Has not gotten OOB.   RHC 12/30/17: RA mean 3 RV 35/6 PA 41/20, mean 30 PCWP mean 16 Oxygen saturations: PA 56% AO 97% Cardiac Output (Fick) 2.5  Cardiac Index (Fick) 1.4 PVR 5.6 WU  Objective:   Weight Range: 65.2 kg Body mass index is 22.51 kg/m.   Vital Signs:   Temp:  [97.4 F (36.3 C)-98.6 F (37 C)] 98.6 F (37 C) (09/17 0415) Pulse Rate:  [0-295] 100 (09/17 0415) Resp:  [0-27] 16 (09/17 0415) BP: (90-108)/(65-84) 106/84 (09/17 0415) SpO2:  [0 %-99 %] 96 % (09/17 0415) Weight:  [65.2 kg] 65.2 kg (09/17 0356) Last BM Date: 12/29/17  Weight change: Filed Weights   12/30/17 0613 12/31/17 0356  Weight: 67.1 kg 65.2 kg    Intake/Output:   Intake/Output Summary (Last 24 hours) at 12/31/2017 0758 Last data filed at 12/31/2017 0618 Gross per 24 hour  Intake 72.7 ml  Output 1675 ml  Net -1602.3 ml      Physical Exam    General:  No resp difficulty HEENT: Normal Neck: Supple. JVP flat. Carotids 2+ bilat; no bruits. No lymphadenopathy or thyromegaly appreciated. Cor: PMI nondisplaced. Regular rate & rhythm. +S3, 2-6 HSM apex. Lungs: Clear Abdomen: Soft, nontender, nondistended. No hepatosplenomegaly. No bruits or masses. Good bowel sounds. Extremities: No cyanosis, clubbing, rash, edema. Warm extremities.  Neuro: Alert & orientedx3, cranial nerves grossly intact. moves all 4 extremities w/o difficulty. Affect pleasant   Telemetry   Sinus tach 100s. Personally reviewed.   EKG    Sinus tach 101 bpm with LBBB. Personally reviewed.   Labs    CBC Recent Labs    12/30/17 0858  WBC 9.8  NEUTROABS 5.8  HGB 15.7  HCT 44.6  MCV 94.1  PLT 210     Basic Metabolic Panel Recent Labs    16/10/96 0858 12/31/17 0408  NA 135 134*  K 4.8 3.7  CL 107 98  CO2 19* 25  GLUCOSE 114* 157*  BUN 17 18  CREATININE 1.03 1.13  CALCIUM 9.3 8.9   Liver Function Tests Recent Labs    12/30/17 0858  AST 17  ALT 12  ALKPHOS 97  BILITOT 0.9  PROT 6.8  ALBUMIN 3.8   No results for input(s): LIPASE, AMYLASE in the last 72 hours. Cardiac Enzymes No results for input(s): CKTOTAL, CKMB, CKMBINDEX, TROPONINI in the last 72 hours.  BNP: BNP (last 3 results) Recent Labs    06/13/17 2143 07/02/17 1531 12/30/17 0858  BNP 1,699.9* 532.9* 4,087.0*    ProBNP (last 3 results) No results for input(s): PROBNP in the last 8760 hours.   D-Dimer No results for input(s): DDIMER in the last 72 hours. Hemoglobin A1C No results for input(s): HGBA1C in the last 72 hours. Fasting Lipid Panel No results for input(s): CHOL, HDL, LDLCALC, TRIG, CHOLHDL, LDLDIRECT in the last 72 hours. Thyroid Function Tests Recent Labs    12/30/17 0858  TSH 1.436    Other results:   Imaging    [place] Ekg Site Rite  Result Date: 12/30/2017 If Site Rite image not attached, placement could not be confirmed due to current cardiac rhythm.     Medications:  Scheduled Medications: . aspirin EC  81 mg Oral Daily  . digoxin  0.125 mg Oral Daily  . enoxaparin (LOVENOX) injection  40 mg Subcutaneous Q24H  . furosemide  40 mg Oral QPM  . furosemide  80 mg Oral Q breakfast  . Influenza vac split quadrivalent PF  0.5 mL Intramuscular Tomorrow-1000  . pantoprazole  40 mg Oral Daily  . potassium chloride SA  40 mEq Oral Daily  . sacubitril-valsartan  1 tablet Oral BID  . sodium chloride flush  10-40 mL Intracatheter Q12H  . sodium chloride flush  3 mL Intravenous Q12H  . spironolactone  25 mg Oral Daily     Infusions: . sodium chloride    . milrinone 0.25 mcg/kg/min (12/31/17 0618)     PRN Medications:  sodium chloride, acetaminophen, albuterol,  ondansetron (ZOFRAN) IV, sodium chloride flush, sodium chloride flush    Patient Profile   Carl Ferguson is a 52 y.o. male with systolic CHF, NICM, Chronic LBBB, Tobacco abuse, prior ETOH abuse, prior substance abuse, and h/o of device placements with extractions due to infection.  Admitted after schedule RHC yesterday with low output and started on milrinone.   Assessment/Plan   1. Chronic Systolic CHF:  - Nonischemic cardiomyopathy, diagnosed in 2011, initially followed at [employer] [facility].  LHC (3/19) with no significant CAD.  CPX (3/19) submaximal, but suggestive of mild to moderate HF limitation. HIV negative 3/19, history of drug/ETOH abuse in the past, no drugs now and has cut back ETOH considerably.  Echo was done today and reviewed, EF 20% with severe LV dilation, severe MR.  NYHA class IIIb symptoms.  He is volume overloaded on exam. He has a wide LBBB.  He does not have an ICD, 2 subcutaneous ICD systems were removed for infection at [facility].  - Low output HF on cath as above.  - Coox 68% today on milrinone 0.25 mcg/kg/min. CVP 5-6 - Continue spironolactone 25 daily - Continue Entresto 24/26 bid. No BP room to titrate.  - Continue Toprol XL 25 mg daily (redued dose) with low output.  - Continue digoxin 0.125 daily. Dig level <0.2 yesterday. - There is some concern that LBBB with dyssynchrony plays a significant role in his severe central MR (likely primarily functional).  Ideally, he would have CRT-D implantation.  However, he had infection of 2 subcutaneous ICD systems at [facility], with device explantations in 11/15 and 9/16. EP to see this admission. He is no longer using drugs and does not drink much at this point. This may help his MR.  If not, would consider Mitraclip (would need TEE). EP plans to implant CRT-D on right side on Thursday.  - He may eventually be an LVAD candidate.  Will need to work on getting him on Medicaid. He will not be a transplant candidate at this time due to active  smoking.  2. ETOH Abuse:  - Encouraged complete cessation. No change. 3. Tobacco abuse  - Encouraged complete cessation. No change.  4. Mitral regurgitation:  - Severe MR on echo 12/26/17 MR appears central, and occurs in the setting of severe LV dilation as well as septal-lateral dyssynchrony with wide LBBB.  - CRT may decrease MR, but not sure he will be a candidate with prior device infections. EP plans to implant CRT-D on Thursday on right side.  - If not a CRT candidate, will need to workup for Mitraclip.  Will need TEE.  5. Social - Have referred to social worker to help him get  Medicaid. No change.  Medication concerns reviewed with patient and pharmacy team. Barriers identified: CSW helping him with Medicaid as above.  Length of Stay: 1  Alford Highland, NP  12/31/2017, 7:58 AM  Advanced Heart Failure Team Pager [phone] (M-F; 7a - 4p)  Please contact [facility] for night-coverage after hours (4p -7a ) and weekends on [email or web address]  Patient seen with NP, agree with the above note.  Co-ox up to 68% today with CVP 5-6.  He feels much better on IV milrinone.  Plan will be to continue current medication regimen including milrinone until Thursday, when he will have BiV-ICD implantation by Dr. Ladona Ridgel.  Will see if he can then be titrated off milrinone.  If not, may need LVAD implantation.  Walk in halls.   Marca Ancona 12/31/2017 12:48 PM

## 2018-01-01 LAB — BASIC METABOLIC PANEL
ANION GAP: 11 (ref 5–15)
BUN: 20 mg/dL (ref 6–20)
CALCIUM: 9.4 mg/dL (ref 8.9–10.3)
CO2: 26 mmol/L (ref 22–32)
Chloride: 99 mmol/L (ref 98–111)
Creatinine, Ser: 1.03 mg/dL (ref 0.61–1.24)
GFR calc Af Amer: >60 mL/min (ref >60)
GFR calc non Af Amer: >60 mL/min (ref >60)
GLUCOSE: 96 mg/dL (ref 70–99)
Potassium: 4.3 mmol/L (ref 3.5–5.1)
Sodium: 136 mmol/L (ref 135–145)

## 2018-01-01 LAB — TYPE AND SCREEN
ABO/RH(D): B POS
ANTIBODY SCREEN: NEGATIVE

## 2018-01-01 LAB — ABO/RH: ABO/RH(D): B POS

## 2018-01-01 LAB — COOXEMETRY PANEL
CARBOXYHEMOGLOBIN: 1.4 % (ref 0.5–1.5)
METHEMOGLOBIN: 0.8 % (ref 0.0–1.5)
O2 Saturation: 56 %
Total hemoglobin: 17.4 g/dL — ABNORMAL HIGH (ref 12.0–16.0)

## 2018-01-01 MED ORDER — CHLORHEXIDINE GLUCONATE 4 % EX LIQD
60.0000 mL | Freq: Once | CUTANEOUS | Status: AC
  Administered 2018-01-01: 4 via TOPICAL
  Filled 2018-01-01: qty 60

## 2018-01-01 MED ORDER — SODIUM CHLORIDE 0.9 % IV SOLN: INTRAVENOUS | Status: DC

## 2018-01-01 MED ORDER — CHLORHEXIDINE GLUCONATE 4 % EX LIQD
60.0000 mL | Freq: Once | CUTANEOUS | Status: AC
  Administered 2018-01-02: 4 via TOPICAL
  Filled 2018-01-01: qty 60

## 2018-01-01 MED ORDER — SODIUM CHLORIDE 0.9 % IV SOLN: 80.0000 mg | INTRAVENOUS | Status: DC

## 2018-01-01 MED ORDER — SODIUM CHLORIDE 0.9 % IV SOLN
INTRAVENOUS | Status: DC
  Administered 2018-01-02: 06:00:00 via INTRAVENOUS

## 2018-01-01 MED ORDER — SODIUM CHLORIDE 0.9 % IV SOLN
80.0000 mg | INTRAVENOUS | Status: AC
  Administered 2018-01-02 (×2): 80 mg

## 2018-01-01 MED ORDER — CEFAZOLIN SODIUM-DEXTROSE 2-4 GM/100ML-% IV SOLN: 2.0000 g | INTRAVENOUS | Status: DC

## 2018-01-01 MED ORDER — CHLORHEXIDINE GLUCONATE 4 % EX LIQD: 60.0000 mL | Freq: Once | CUTANEOUS | Status: DC

## 2018-01-01 MED ORDER — CEFAZOLIN SODIUM-DEXTROSE 2-4 GM/100ML-% IV SOLN
2.0000 g | INTRAVENOUS | Status: AC
  Administered 2018-01-02: 2 g via INTRAVENOUS

## 2018-01-01 NOTE — Progress Notes (Signed)
Progress Note  Patient Name: Carl Ferguson Date of Encounter: 01/01/2018  Primary Cardiologist: Garwin Brothers, MD   Subjective   "I feel better doc", no chest pain, dyspnea improved.  Inpatient Medications    Scheduled Meds: . aspirin EC  81 mg Oral Daily  . chlorhexidine  60 mL Topical Once  . digoxin  0.125 mg Oral Daily  . enoxaparin (LOVENOX) injection  40 mg Subcutaneous Q24H  . furosemide  40 mg Oral QPM  . furosemide  80 mg Oral Q breakfast  . [START ON 01/02/2018] gentamicin irrigation  80 mg Irrigation On Call  . pantoprazole  40 mg Oral Daily  . potassium chloride SA  40 mEq Oral Daily  . sacubitril-valsartan  1 tablet Oral BID  . sodium chloride flush  10-40 mL Intracatheter Q12H  . sodium chloride flush  3 mL Intravenous Q12H  . spironolactone  25 mg Oral Daily   Continuous Infusions: . sodium chloride    . [START ON 01/02/2018] sodium chloride    . [START ON 01/02/2018]  ceFAZolin (ANCEF) IV    . milrinone 0.25 mcg/kg/min (12/31/17 2218)   PRN Meds: sodium chloride, acetaminophen, albuterol, ondansetron (ZOFRAN) IV, sodium chloride flush, sodium chloride flush   Vital Signs    Vitals:   12/31/17 2242 01/01/18 0341 01/01/18 0444 01/01/18 0800  BP: 99/78   97/71  Pulse: (!) 106   (!) 54  Resp: (!) 21   19  Temp: 99.4 F (37.4 C) 98.1 F (36.7 C)  97.6 F (36.4 C)  TempSrc: Oral Oral    SpO2: 97%   96%  Weight:   64.5 kg   Height:        Intake/Output Summary (Last 24 hours) at 01/01/2018 0834 Last data filed at 01/01/2018 0625 Gross per 24 hour  Intake 112.81 ml  Output 1250 ml  Net -1137.19 ml   Filed Weights   12/30/17 0613 12/31/17 0356 01/01/18 0444  Weight: 67.1 kg 65.2 kg 64.5 kg    Telemetry    nsr with pvc's - Personally Reviewed  ECG    none - Personally Reviewed  Physical Exam   GEN: No acute distress.   Neck: 7 cm JVD Cardiac: RRR with S3, soft systolic at base, no rubs  Respiratory: Clear to auscultation  bilaterally. GI: Soft, nontender, non-distended  MS: No edema; No deformity. Neuro:  Nonfocal  Psych: Normal affect   Labs    Chemistry Recent Labs  Lab 12/30/17 0858 12/31/17 0408 01/01/18 0340  NA 135 134* 136  K 4.8 3.7 4.3  CL 107 98 99  CO2 19* 25 26  GLUCOSE 114* 157* 96  BUN 17 18 20   CREATININE 1.03 1.13 1.03  CALCIUM 9.3 8.9 9.4  PROT 6.8  --   --   ALBUMIN 3.8  --   --   AST 17  --   --   ALT 12  --   --   ALKPHOS 97  --   --   BILITOT 0.9  --   --   GFRNONAA >60 >60 >60  GFRAA >60 >60 >60  ANIONGAP 9 11 11      Hematology Recent Labs  Lab 12/26/17 1249 12/30/17 0858  WBC 9.8 9.8  RBC 5.07 4.74  HGB 16.9 15.7  HCT 48.1 44.6  MCV 94.9 94.1  MCH 33.3 33.1  MCHC 35.1 35.2  RDW 13.6 13.6  PLT 240 210    Cardiac EnzymesNo results for input(s): TROPONINI  in the last 168 hours. No results for input(s): TROPIPOC in the last 168 hours.   BNP Recent Labs  Lab 12/30/17 0858  BNP 4,087.0*     DDimer No results for input(s): DDIMER in the last 168 hours.   Radiology    Dg Chest 2 View  Result Date: 12/31/2017 CLINICAL DATA:  Evaluate for CHF. EXAM: CHEST - 2 VIEW COMPARISON:  12/05/2017 FINDINGS: Left arm PICC line tip projects over the cavoatrial junction. Normal heart size. No pleural effusion or edema. No airspace opacities identified. The visualized osseous structures are unremarkable. IMPRESSION: 1. No evidence for CHF. Electronically Signed   By: Signa Kell M.D.   On: 12/31/2017 09:20   [place] Ekg Site Rite  Result Date: 12/30/2017 If Site Rite image not attached, placement could not be confirmed due to current cardiac rhythm.   Cardiac Studies   none  Patient Profile     52 y.o. male admitted after right heart cath with low CO, started on milrinone for BiV ICD tomorrow  Assessment & Plan    1. Acute on chronic systolic heart failure - he is much improved symptomatically on milrinone. Hopefully after BiV ICD we can wean milrinone. 2.  ICD - the patient has had multiple left sided implants including a SQ ICD which got infected. He is at increased risk for infection. Hopefully his anatomy will allow [place] to successfully implant a LV lead a nice location.  For questions or updates, please contact [facility] Please consult [email or web address] for contact info under Cardiology/STEMI.      Signed, Lewayne Bunting, MD  01/01/2018, 8:34 AM  Patient ID: Carl Ferguson, male   DOB: 07-Jul-1965, 52 y.o.   MRN: [id number]

## 2018-01-01 NOTE — H&P (View-Only) (Signed)
 Progress Note  Patient Name: Jillian P Pancoast Date of Encounter: 01/01/2018  Primary Cardiologist: Rajan R Revankar, MD   Subjective   "I feel better doc", no chest pain, dyspnea improved.  Inpatient Medications    Scheduled Meds: . aspirin EC  81 mg Oral Daily  . chlorhexidine  60 mL Topical Once  . digoxin  0.125 mg Oral Daily  . enoxaparin (LOVENOX) injection  40 mg Subcutaneous Q24H  . furosemide  40 mg Oral QPM  . furosemide  80 mg Oral Q breakfast  . [START ON 01/02/2018] gentamicin irrigation  80 mg Irrigation On Call  . pantoprazole  40 mg Oral Daily  . potassium chloride SA  40 mEq Oral Daily  . sacubitril-valsartan  1 tablet Oral BID  . sodium chloride flush  10-40 mL Intracatheter Q12H  . sodium chloride flush  3 mL Intravenous Q12H  . spironolactone  25 mg Oral Daily   Continuous Infusions: . sodium chloride    . [START ON 01/02/2018] sodium chloride    . [START ON 01/02/2018]  ceFAZolin (ANCEF) IV    . milrinone 0.25 mcg/kg/min (12/31/17 2218)   PRN Meds: sodium chloride, acetaminophen, albuterol, ondansetron (ZOFRAN) IV, sodium chloride flush, sodium chloride flush   Vital Signs    Vitals:   12/31/17 2242 01/01/18 0341 01/01/18 0444 01/01/18 0800  BP: 99/78   97/71  Pulse: (!) 106   (!) 54  Resp: (!) 21   19  Temp: 99.4 F (37.4 C) 98.1 F (36.7 C)  97.6 F (36.4 C)  TempSrc: Oral Oral    SpO2: 97%   96%  Weight:   64.5 kg   Height:        Intake/Output Summary (Last 24 hours) at 01/01/2018 0834 Last data filed at 01/01/2018 0625 Gross per 24 hour  Intake 112.81 ml  Output 1250 ml  Net -1137.19 ml   Filed Weights   12/30/17 0613 12/31/17 0356 01/01/18 0444  Weight: 67.1 kg 65.2 kg 64.5 kg    Telemetry    nsr with pvc's - Personally Reviewed  ECG    none - Personally Reviewed  Physical Exam   GEN: No acute distress.   Neck: 7 cm JVD Cardiac: RRR with S3, soft systolic at base, no rubs  Respiratory: Clear to auscultation  bilaterally. GI: Soft, nontender, non-distended  MS: No edema; No deformity. Neuro:  Nonfocal  Psych: Normal affect   Labs    Chemistry Recent Labs  Lab 12/30/17 0858 12/31/17 0408 01/01/18 0340  NA 135 134* 136  K 4.8 3.7 4.3  CL 107 98 99  CO2 19* 25 26  GLUCOSE 114* 157* 96  BUN 17 18 20  CREATININE 1.03 1.13 1.03  CALCIUM 9.3 8.9 9.4  PROT 6.8  --   --   ALBUMIN 3.8  --   --   AST 17  --   --   ALT 12  --   --   ALKPHOS 97  --   --   BILITOT 0.9  --   --   GFRNONAA >60 >60 >60  GFRAA >60 >60 >60  ANIONGAP 9 11 11     Hematology Recent Labs  Lab 12/26/17 1249 12/30/17 0858  WBC 9.8 9.8  RBC 5.07 4.74  HGB 16.9 15.7  HCT 48.1 44.6  MCV 94.9 94.1  MCH 33.3 33.1  MCHC 35.1 35.2  RDW 13.6 13.6  PLT 240 210    Cardiac EnzymesNo results for input(s): TROPONINI   in the last 168 hours. No results for input(s): TROPIPOC in the last 168 hours.   BNP Recent Labs  Lab 12/30/17 0858  BNP 4,087.0*     DDimer No results for input(s): DDIMER in the last 168 hours.   Radiology    Dg Chest 2 View  Result Date: 12/31/2017 CLINICAL DATA:  Evaluate for CHF. EXAM: CHEST - 2 VIEW COMPARISON:  12/05/2017 FINDINGS: Left arm PICC line tip projects over the cavoatrial junction. Normal heart size. No pleural effusion or edema. No airspace opacities identified. The visualized osseous structures are unremarkable. IMPRESSION: 1. No evidence for CHF. Electronically Signed   By: Taylor  Stroud M.D.   On: 12/31/2017 09:20   [place] Ekg Site Rite  Result Date: 12/30/2017 If Site Rite image not attached, placement could not be confirmed due to current cardiac rhythm.   Cardiac Studies   none  Patient Profile     52 y.o. male admitted after right heart cath with low CO, started on milrinone for BiV ICD tomorrow  Assessment & Plan    1. Acute on chronic systolic heart failure - he is much improved symptomatically on milrinone. Hopefully after BiV ICD we can wean milrinone. 2.  ICD - the patient has had multiple left sided implants including a SQ ICD which got infected. He is at increased risk for infection. Hopefully his anatomy will allow us to successfully implant a LV lead a nice location.  For questions or updates, please contact [facility] Please consult [email or web address] for contact info under Cardiology/STEMI.      Signed, Gregg Taylor, MD  01/01/2018, 8:34 AM  Patient ID: Kota P Copeman, male   DOB: 02/02/1966, 52 y.o.   MRN: [id number]  

## 2018-01-01 NOTE — Progress Notes (Signed)
CARDIAC REHAB PHASE I   PRE:  Rate/Rhythm: 105 ST with PVCs    BP: sitting 108/64    SaO2: 95-97 RA  MODE:  Ambulation: 340 ft   POST:  Rate/Rhythm: 111 ST with PVCs    BP: sitting 110/77     SaO2: 96 RA  Tolerated well, no c/o. Sts he feels well, much improved from PTA. He can walk independently on unit.  [phone]   Harriet Masson CES, ACSM 01/01/2018 1:39 PM

## 2018-01-01 NOTE — Clinical Social Work Note (Addendum)
CSW acknowledges consult "Medicaid potential." Heart failure social worker has met with patient regarding Medicaid/SSI and put a note in on 9/12. CSW Advice worker to follow up on this.  CSW signing off. Consult again if any social work needs arise.  Dayton Scrape, [place] [phone]  10:44 am Received response from financial counselor: "I went upstairs to talk to him about Medicaid and Disability.  He is going to DSS in [place] to take the application they gave him a few weeks ago.  He is also saying he is unsure if his disability app was denied but will call an d check on this.  He has signed my Medicaid and Disability forms just incase we end up needing them."  CSW signing off.  Dayton Scrape, [place]

## 2018-01-01 NOTE — Progress Notes (Addendum)
Advanced Heart Failure Rounding Note  PCP-Cardiologist: Garwin Brothers, MD   Subjective:    Started on milrinone 0.25 mcg/kg/min 9/16  for low output on cath. Coox 56% this am.  Weight down 1 more lb. CVP 7. On PO lasix. Creatinine stable. SBP 90s.  Feels great. Denies CP, SOB, orthopnea. Has not been able to walk as much as he'd like.   RHC 12/30/17: RA mean 3 RV 35/6 PA 41/20, mean 30 PCWP mean 16 Oxygen saturations: PA 56% AO 97% Cardiac Output (Fick) 2.5  Cardiac Index (Fick) 1.4 PVR 5.6 WU  Objective:   Weight Range: 64.5 kg Body mass index is 22.27 kg/m.   Vital Signs:   Temp:  [97.6 F (36.4 C)-99.4 F (37.4 C)] 98.1 F (36.7 C) (09/18 0341) Pulse Rate:  [86-112] 106 (09/17 2242) Resp:  [16-35] 21 (09/17 2242) BP: (87-102)/(71-84) 99/78 (09/17 2242) SpO2:  [94 %-100 %] 97 % (09/17 2242) Weight:  [64.5 kg] 64.5 kg (09/18 0444) Last BM Date: 12/29/17  Weight change: Filed Weights   12/30/17 0613 12/31/17 0356 01/01/18 0444  Weight: 67.1 kg 65.2 kg 64.5 kg    Intake/Output:   Intake/Output Summary (Last 24 hours) at 01/01/2018 0724 Last data filed at 01/01/2018 0625 Gross per 24 hour  Intake 121.36 ml  Output 1250 ml  Net -1128.64 ml      Physical Exam    General:  No resp difficulty. HEENT: Normal Neck: Supple. JVP 7-8. Carotids 2+ bilat; no bruits. No thyromegaly or nodule noted. Cor: PMI nondisplaced. RRR, +S3, 2/6 HSM apex Lungs: CTAB, normal effort. Abdomen: Soft, non-tender, non-distended, no HSM. No bruits or masses. +BS  Extremities: No cyanosis, clubbing, or rash. R and LLE no edema.  Neuro: Alert & orientedx3, cranial nerves grossly intact. moves all 4 extremities w/o difficulty. Affect pleasant   Telemetry   NSR 90s with rare PVCs. Personally reviewed.   EKG    No new tracings.   Labs    CBC Recent Labs    12/30/17 0858  WBC 9.8  NEUTROABS 5.8  HGB 15.7  HCT 44.6  MCV 94.1  PLT 210   Basic Metabolic  Panel Recent Labs    12/31/17 0408 01/01/18 0340  NA 134* 136  K 3.7 4.3  CL 98 99  CO2 25 26  GLUCOSE 157* 96  BUN 18 20  CREATININE 1.13 1.03  CALCIUM 8.9 9.4   Liver Function Tests Recent Labs    12/30/17 0858  AST 17  ALT 12  ALKPHOS 97  BILITOT 0.9  PROT 6.8  ALBUMIN 3.8   No results for input(s): LIPASE, AMYLASE in the last 72 hours. Cardiac Enzymes No results for input(s): CKTOTAL, CKMB, CKMBINDEX, TROPONINI in the last 72 hours.  BNP: BNP (last 3 results) Recent Labs    06/13/17 2143 07/02/17 1531 12/30/17 0858  BNP 1,699.9* 532.9* 4,087.0*    ProBNP (last 3 results) No results for input(s): PROBNP in the last 8760 hours.   D-Dimer No results for input(s): DDIMER in the last 72 hours. Hemoglobin A1C No results for input(s): HGBA1C in the last 72 hours. Fasting Lipid Panel No results for input(s): CHOL, HDL, LDLCALC, TRIG, CHOLHDL, LDLDIRECT in the last 72 hours. Thyroid Function Tests Recent Labs    12/30/17 0858  TSH 1.436    Other results:   Imaging    No results found.   Medications:     Scheduled Medications: . aspirin EC  81 mg Oral Daily  .  digoxin  0.125 mg Oral Daily  . enoxaparin (LOVENOX) injection  40 mg Subcutaneous Q24H  . furosemide  40 mg Oral QPM  . furosemide  80 mg Oral Q breakfast  . pantoprazole  40 mg Oral Daily  . potassium chloride SA  40 mEq Oral Daily  . sacubitril-valsartan  1 tablet Oral BID  . sodium chloride flush  10-40 mL Intracatheter Q12H  . sodium chloride flush  3 mL Intravenous Q12H  . spironolactone  25 mg Oral Daily    Infusions: . sodium chloride    . milrinone 0.25 mcg/kg/min (12/31/17 2218)    PRN Medications: sodium chloride, acetaminophen, albuterol, ondansetron (ZOFRAN) IV, sodium chloride flush, sodium chloride flush    Patient Profile   Carl Ferguson is a 52 y.o. male with systolic CHF, NICM, Chronic LBBB, Tobacco abuse, prior ETOH abuse, prior substance abuse, and h/o  of device placements with extractions due to infection.  Admitted after schedule RHC yesterday with low output and started on milrinone.   Assessment/Plan   1. Chronic Systolic CHF:  - Nonischemic cardiomyopathy, diagnosed in 2011, initially followed at [place].  LHC (3/19) with no significant CAD.  CPX (3/19) submaximal, but suggestive of mild to moderate HF limitation. HIV negative 3/19, history of drug/ETOH abuse in the past, no drugs now and has cut back ETOH considerably.  Echo was done today and reviewed, EF 20% with severe LV dilation, severe MR.  NYHA class IIIb symptoms.  He is volume overloaded on exam. He has a wide LBBB.  He does not have an ICD, 2 subcutaneous ICD systems were removed for infection at [facility].  - Low output HF on cath as above.  - Coox 56% today on milrinone 0.25 mcg/kg/min. CVP 7.  Will attempt slow milrinone wean after CRT-D device placement.  - Continue lasix 80 mg am, 40 mg pm. - Continue spironolactone 25 daily - Continue Entresto 24/26 bid. No BP room to titrate. SBP 90s - Continue Toprol XL 25 mg daily (redued dose) with low output.  - Continue digoxin 0.125 daily. Dig level <0.2 9/16. - There is some concern that LBBB with dyssynchrony plays a significant role in his severe central MR (likely primarily functional).  Ideally, he would have CRT-D implantation.  However, he had infection of 2 subcutaneous ICD systems at [facility], with device explantations in 11/15 and 9/16.  He is no longer using drugs and does not drink much at this point. Plan to attempt CRT-D tomorrow.  This may help his MR.  If not, would consider Mitraclip (would need TEE).  - He would likely be an LVAD candidate if we cannot get him off milrinone.  Will need to work on getting him on Medicaid. He will not be a transplant candidate at this time due to active smoking.  - Consult cardiac rehab 2. ETOH Abuse:  - Encouraged complete cessation. No change.  3. Tobacco abuse  - Encouraged complete  cessation. No change.  4. Mitral regurgitation:  - Severe MR on echo 12/26/17 MR appears central, and occurs in the setting of severe LV dilation as well as septal-lateral dyssynchrony with wide LBBB.  - CRT may decrease MR. EP plans to implant CRT-D on Thursday on right side. No change.  - If unable to place CRT, consider Mitraclip versus straight to LVAD.  5. Social - Have referred to social worker to help him get Medicaid. No change.  Medication concerns reviewed with patient and pharmacy team. Barriers identified: CSW helping  him with Medicaid as above.  Length of Stay: 2  Alford Highland, NP  01/01/2018, 7:24 AM  Advanced Heart Failure Team Pager [phone] (M-F; 7a - 4p)  Please contact [facility] for night-coverage after hours (4p -7a ) and weekends on [email or web address]  Patien seen with NP, agree with the above note. He feels much better on milrinone, NYHA class II symptoms now.  CVP 7, now volume overloaded. Able to walk as much as he wants.    Plan for CRT-D tomorrow.  After CRT-D, will attempt to wean milrinone.  Will also need reassessment to see if it helps his MR.  If unable to place CRT device, will need to consider LVAD.    Marca Ancona 01/01/2018 2:23 PM

## 2018-01-02 ENCOUNTER — Encounter (HOSPITAL_COMMUNITY)
Admission: RE | Disposition: A | Discharge status: Home Health | Payer: Self-pay | Source: Ambulatory Visit | Attending: Cardiology

## 2018-01-02 DIAGNOSIS — I428 Other cardiomyopathies: Secondary | ICD-10-CM

## 2018-01-02 HISTORY — PX: BIV ICD INSERTION CRT-D: EP1195

## 2018-01-02 LAB — COOXEMETRY PANEL
CARBOXYHEMOGLOBIN: 1.5 % (ref 0.5–1.5)
Methemoglobin: 1.6 % — ABNORMAL HIGH (ref 0.0–1.5)
O2 Saturation: 73.9 %
Total hemoglobin: 17.2 g/dL — ABNORMAL HIGH (ref 12.0–16.0)

## 2018-01-02 LAB — BASIC METABOLIC PANEL
ANION GAP: 10 (ref 5–15)
BUN: 21 mg/dL — ABNORMAL HIGH (ref 6–20)
CALCIUM: 9.4 mg/dL (ref 8.9–10.3)
CO2: 26 mmol/L (ref 22–32)
CREATININE: 1.04 mg/dL (ref 0.61–1.24)
Chloride: 99 mmol/L (ref 98–111)
GFR calc non Af Amer: >60 mL/min (ref >60)
GLUCOSE: 99 mg/dL (ref 70–99)
POTASSIUM: 4.1 mmol/L (ref 3.5–5.1)
Sodium: 135 mmol/L (ref 135–145)

## 2018-01-02 LAB — SURGICAL PCR SCREEN
MRSA, PCR: NEGATIVE
Staphylococcus aureus: NEGATIVE

## 2018-01-02 SURGERY — BIV ICD INSERTION CRT-D
Anesthesia: LOCAL

## 2018-01-02 MED ORDER — ONDANSETRON HCL 4 MG/2ML IJ SOLN: 4.0000 mg | Freq: Four times a day (QID) | INTRAMUSCULAR | Status: DC | PRN

## 2018-01-02 MED ORDER — FENTANYL CITRATE (PF) 100 MCG/2ML IJ SOLN
INTRAMUSCULAR | Status: DC | PRN
  Administered 2018-01-02 (×8): 12.5 ug via INTRAVENOUS

## 2018-01-02 MED ORDER — CEFAZOLIN SODIUM-DEXTROSE 2-4 GM/100ML-% IV SOLN
INTRAVENOUS | Status: AC
  Filled 2018-01-02: qty 100

## 2018-01-02 MED ORDER — HEPARIN (PORCINE) IN NACL 1000-0.9 UT/500ML-% IV SOLN
INTRAVENOUS | Status: DC | PRN
  Administered 2018-01-02: 500 mL

## 2018-01-02 MED ORDER — SODIUM CHLORIDE 0.9 % IV SOLN
INTRAVENOUS | Status: AC
  Filled 2018-01-02: qty 2

## 2018-01-02 MED ORDER — LIDOCAINE HCL (PF) 1 % IJ SOLN
INTRAMUSCULAR | Status: AC
  Filled 2018-01-02: qty 30

## 2018-01-02 MED ORDER — OXYCODONE-ACETAMINOPHEN 5-325 MG PO TABS
1.0000 | ORAL_TABLET | ORAL | Status: DC | PRN
  Administered 2018-01-02 – 2018-01-11 (×30): 1 via ORAL
  Filled 2018-01-02 (×31): qty 1

## 2018-01-02 MED ORDER — MIDAZOLAM HCL 5 MG/5ML IJ SOLN
INTRAMUSCULAR | Status: DC | PRN
  Administered 2018-01-02 (×8): 1 mg via INTRAVENOUS

## 2018-01-02 MED ORDER — CEFAZOLIN SODIUM-DEXTROSE 1-4 GM/50ML-% IV SOLN
1.0000 g | Freq: Four times a day (QID) | INTRAVENOUS | Status: AC
  Administered 2018-01-02 – 2018-01-03 (×3): 1 g via INTRAVENOUS
  Filled 2018-01-02 (×3): qty 50

## 2018-01-02 MED ORDER — LIDOCAINE HCL (PF) 1 % IJ SOLN
INTRAMUSCULAR | Status: DC | PRN
  Administered 2018-01-02: 40 mL

## 2018-01-02 MED ORDER — IOPAMIDOL (ISOVUE-370) INJECTION 76%
INTRAVENOUS | Status: AC
  Filled 2018-01-02: qty 50

## 2018-01-02 MED ORDER — HEPARIN (PORCINE) IN NACL 1000-0.9 UT/500ML-% IV SOLN
INTRAVENOUS | Status: AC
  Filled 2018-01-02: qty 500

## 2018-01-02 MED ORDER — ACETAMINOPHEN 325 MG PO TABS
325.0000 mg | ORAL_TABLET | ORAL | Status: DC | PRN
  Administered 2018-01-05 (×2): 650 mg via ORAL
  Administered 2018-01-06 (×3): 325 mg via ORAL
  Administered 2018-01-06 – 2018-01-07 (×4): 650 mg via ORAL
  Administered 2018-01-07: 325 mg via ORAL
  Administered 2018-01-08 – 2018-01-11 (×11): 650 mg via ORAL
  Filled 2018-01-02 (×2): qty 1
  Filled 2018-01-02 (×17): qty 2
  Filled 2018-01-02: qty 1
  Filled 2018-01-02 (×2): qty 2

## 2018-01-02 MED ORDER — MIDAZOLAM HCL 5 MG/5ML IJ SOLN
INTRAMUSCULAR | Status: AC
  Filled 2018-01-02: qty 5

## 2018-01-02 MED ORDER — FENTANYL CITRATE (PF) 100 MCG/2ML IJ SOLN
INTRAMUSCULAR | Status: AC
  Filled 2018-01-02: qty 2

## 2018-01-02 MED ORDER — IOPAMIDOL (ISOVUE-370) INJECTION 76%
INTRAVENOUS | Status: DC | PRN
  Administered 2018-01-02: 60 mL via INTRAVENOUS

## 2018-01-02 SURGICAL SUPPLY — 27 items
ADAPTER SEALING SSA-EW-09 (MISCELLANEOUS) ×2 IMPLANT
BALLN ATTAIN 80 (BALLOONS) ×2
BALLOON ATTAIN 80 (BALLOONS) ×1 IMPLANT
CABLE SURGICAL S-101-97-12 (CABLE) ×4 IMPLANT
CATH ATTAIN COM SURV 6250V-EH (CATHETERS) ×2 IMPLANT
CATH HEX JOS 2-5-2 65CM 6F REP (CATHETERS) ×2 IMPLANT
CATH HIS SELECTSITE C304HIS (CATHETERS) ×2 IMPLANT
CATH RIGHTSITE C315HIS02 (CATHETERS) ×4 IMPLANT
ICD CLARIA MRI DTMA1D1 (ICD Generator) ×2 IMPLANT
KIT ESSENTIALS PG (KITS) ×2 IMPLANT
LEAD CAPSURE NOVUS 45CM (Lead) ×2 IMPLANT
LEAD SELECT SECURE 3830 383069 (Lead) ×1 IMPLANT
LEAD SPRINT QUAT SEC 6935-58CM (Lead) ×2 IMPLANT
PAD PRO RADIOLUCENT 2001M-C (PAD) ×2 IMPLANT
POUCH AIGIS-R ANTIBACT ICD (Mesh General) ×2 IMPLANT
SELECT SECURE 3830 383069 (Lead) ×2 IMPLANT
SHEATH CLASSIC 7F (SHEATH) ×2 IMPLANT
SHEATH CLASSIC 9.5F (SHEATH) ×2 IMPLANT
SHEATH CLASSIC 9F (SHEATH) ×2 IMPLANT
SHEATH WORLEY 9FR 62CM (SHEATH) ×2 IMPLANT
SLITTER 6232ADJ (MISCELLANEOUS) ×2 IMPLANT
TOOL TRANSVALV INSERT TVI-07 (MISCELLANEOUS) ×2 IMPLANT
TRAY PACEMAKER INSERTION (PACKS) ×2 IMPLANT
WIRE ACUITY WHISPER EDS 4648 (WIRE) ×2 IMPLANT
WIRE HI TORQ VERSACORE-J 145CM (WIRE) ×2 IMPLANT
WIRE LUGE 182CM (WIRE) ×2 IMPLANT
WIRE MAILMAN 182CM (WIRE) ×2 IMPLANT

## 2018-01-02 NOTE — Progress Notes (Signed)
Patient taken by cath lab staff via bed to cath lab for pacemaker placement.

## 2018-01-02 NOTE — Interval H&P Note (Signed)
History and Physical Interval Note:  01/02/2018 10:44 AM  [name]  has presented today for surgery, with the diagnosis of chf  The various methods of treatment have been discussed with the patient and family. After consideration of risks, benefits and other options for treatment, the patient has consented to  Procedure(s): BIV ICD INSERTION CRT-D (N/A) as a surgical intervention .  The patient's history has been reviewed, patient examined, no change in status, stable for surgery.  I have reviewed the patient's chart and labs.  Questions were answered to the patient's satisfaction.     Lewayne Bunting

## 2018-01-02 NOTE — Care Management Note (Signed)
Case Management Note  Patient Details  Name: Carl Ferguson MRN: [id number] Date of Birth: 1966-04-02  Subjective/Objective:   Pt was admitted post RHC yesterday for failed ICD                   Action/Plan:  PTA independent from home.     Expected Discharge Date:                  Expected Discharge Plan:     In-House Referral:     Discharge planning Services  CM Consult  Post Acute Care Choice:    Choice offered to:     DME Arranged:    DME Agency:     HH Arranged:    HH Agency:     Status of Service:  In process, will continue to follow  If discussed at Long Length of Stay Meetings, dates discussed:    Additional Comments: 01/02/2018 Pt is currently from home, plan is to wean milrinone post new ICD placement. CM will continue to follow  Cherylann Parr, RN 01/02/2018, 4:24 PM

## 2018-01-02 NOTE — Plan of Care (Signed)

## 2018-01-02 NOTE — Progress Notes (Addendum)
Advanced Heart Failure Rounding Note  PCP-Cardiologist: Garwin Brothers, MD   Subjective:    Started on milrinone 0.25 mcg/kg/min 9/16  for low output on cath. Coox 74% this am.  Good UOP yesterday, but lots of PO intake. I/O about even. Weight unchanged. CVP 5. Creatinine stable. SBP 82-106  Planning for CRT-D today. Will then try to wean milrinone.  Denies SOB, orthopnea, or dizziness. Nervous about procedure today. Able to walk around independently with no problems.  RHC 12/30/17: RA mean 3 RV 35/6 PA 41/20, mean 30 PCWP mean 16 Oxygen saturations: PA 56% AO 97% Cardiac Output (Fick) 2.5  Cardiac Index (Fick) 1.4 PVR 5.6 WU  Objective:   Weight Range: 64.4 kg Body mass index is 22.22 kg/m.   Vital Signs:   Temp:  [97.6 F (36.4 C)-98.8 F (37.1 C)] 98 F (36.7 C) (09/19 0725) Pulse Rate:  [54-98] 98 (09/19 0725) Resp:  [16-23] 23 (09/19 0725) BP: (82-106)/(63-80) 82/72 (09/19 0725) SpO2:  [95 %-100 %] 95 % (09/19 0725) Weight:  [64.4 kg] 64.4 kg (09/19 0614) Last BM Date: 01/01/18  Weight change: Filed Weights   12/31/17 0356 01/01/18 0444 01/02/18 0614  Weight: 65.2 kg 64.5 kg 64.4 kg    Intake/Output:   Intake/Output Summary (Last 24 hours) at 01/02/2018 0745 Last data filed at 01/01/2018 1900 Gross per 24 hour  Intake 1836.07 ml  Output 2075 ml  Net -238.93 ml      Physical Exam    General: No resp difficulty. HEENT: Normal Neck: Supple. No JVD. Carotids 2+ bilat; no bruits. No thyromegaly or nodule noted. Cor: PMI nondisplaced. RRR, +S3, 2/6 HSM apex Lungs: CTAB, normal effort. Abdomen: Soft, non-tender, non-distended, no HSM. No bruits or masses. +BS  Extremities: No cyanosis, clubbing, or rash. R and LLE no edema.  Neuro: Alert & orientedx3, cranial nerves grossly intact. moves all 4 extremities w/o difficulty. Affect pleasant   Telemetry   NSR/sinus tach 90-100s with LBBB and occ PVCs. Personally reviewed.   EKG    No new  tracings.   Labs    CBC Recent Labs    12/30/17 0858  WBC 9.8  NEUTROABS 5.8  HGB 15.7  HCT 44.6  MCV 94.1  PLT 210   Basic Metabolic Panel Recent Labs    40/98/11 0340 01/02/18 0330  NA 136 135  K 4.3 4.1  CL 99 99  CO2 26 26  GLUCOSE 96 99  BUN 20 21*  CREATININE 1.03 1.04  CALCIUM 9.4 9.4   Liver Function Tests Recent Labs    12/30/17 0858  AST 17  ALT 12  ALKPHOS 97  BILITOT 0.9  PROT 6.8  ALBUMIN 3.8   No results for input(s): LIPASE, AMYLASE in the last 72 hours. Cardiac Enzymes No results for input(s): CKTOTAL, CKMB, CKMBINDEX, TROPONINI in the last 72 hours.  BNP: BNP (last 3 results) Recent Labs    06/13/17 2143 07/02/17 1531 12/30/17 0858  BNP 1,699.9* 532.9* 4,087.0*    ProBNP (last 3 results) No results for input(s): PROBNP in the last 8760 hours.   D-Dimer No results for input(s): DDIMER in the last 72 hours. Hemoglobin A1C No results for input(s): HGBA1C in the last 72 hours. Fasting Lipid Panel No results for input(s): CHOL, HDL, LDLCALC, TRIG, CHOLHDL, LDLDIRECT in the last 72 hours. Thyroid Function Tests Recent Labs    12/30/17 0858  TSH 1.436    Other results:   Imaging    No results found.  Medications:     Scheduled Medications: . aspirin EC  81 mg Oral Daily  . chlorhexidine  60 mL Topical Once  . digoxin  0.125 mg Oral Daily  . enoxaparin (LOVENOX) injection  40 mg Subcutaneous Q24H  . furosemide  40 mg Oral QPM  . furosemide  80 mg Oral Q breakfast  . gentamicin irrigation  80 mg Irrigation To Cath  . pantoprazole  40 mg Oral Daily  . potassium chloride SA  40 mEq Oral Daily  . sacubitril-valsartan  1 tablet Oral BID  . sodium chloride flush  10-40 mL Intracatheter Q12H  . sodium chloride flush  3 mL Intravenous Q12H  . spironolactone  25 mg Oral Daily    Infusions: . sodium chloride    . sodium chloride 50 mL/hr at 01/02/18 0623  .  ceFAZolin (ANCEF) IV    . milrinone 0.25 mcg/kg/min  (01/01/18 1200)    PRN Medications: sodium chloride, acetaminophen, albuterol, ondansetron (ZOFRAN) IV, sodium chloride flush, sodium chloride flush    Patient Profile   Carl Ferguson is a 51 y.o. male with systolic CHF, NICM, Chronic LBBB, Tobacco abuse, prior ETOH abuse, prior substance abuse, and h/o of device placements with extractions due to infection.  Admitted after schedule RHC yesterday with low output and started on milrinone.   Assessment/Plan   1. Chronic Systolic CHF:  - Nonischemic cardiomyopathy, diagnosed in 2011, initially followed at [place]/[facility].  LHC (3/19) with no significant CAD.  CPX (3/19) submaximal, but suggestive of mild to moderate HF limitation. HIV negative 3/19, history of drug/ETOH abuse in the past, no drugs now and has cut back ETOH considerably.  Echo was done today and reviewed, EF 20% with severe LV dilation, severe MR.  NYHA class IIIb symptoms.  He is volume overloaded on exam. He has a wide LBBB.  He does not have an ICD, 2 subcutaneous ICD systems were removed for infection at [facility].  - Low output HF on cath as above.  - Coox 74% today on milrinone 0.25 mcg/kg/min. CVP 5.  Will attempt slow milrinone wean after CRT-D device placement.  - Continue lasix 80 mg am, 40 mg pm. - Continue spironolactone 25 daily - Continue Entresto 24/26 bid. No BP room to titrate. SBP 82-100s (typically 90s). - Continue Toprol XL 25 mg daily (redued dose) with low output.  - Continue digoxin 0.125 daily. Dig level <0.2 9/16. - There is some concern that LBBB with dyssynchrony plays a significant role in his severe central MR (likely primarily functional).  Ideally, he would have CRT-D implantation.  However, he had infection of 2 subcutaneous ICD systems at [facility], with device explantations in 11/15 and 9/16.  He is no longer using drugs and does not drink much at this point. Plan to attempt CRT-D today.  This may help his MR.  If not, would consider Mitraclip (would need  TEE).  - He would likely be an LVAD candidate if we cannot get him off milrinone.  Will need to work on getting him on Medicaid. He will not be a transplant candidate at this time due to active smoking.  - Cardiac rehab following. 2. ETOH Abuse:  - Encouraged complete cessation.No change.  3. Tobacco abuse  - Encouraged complete cessation. No change. 4. Mitral regurgitation:  - Severe MR on echo 12/26/17 MR appears central, and occurs in the setting of severe LV dilation as well as septal-lateral dyssynchrony with wide LBBB.  - CRT may decrease MR. EP plans  to implant CRT-D today on right side.  - If unable to place CRT, consider Mitraclip versus straight to LVAD.  5. Social - Have referred to social worker to help him get Medicaid. He has been working with DSS in [place]. Has also applied for disability and plans to call and check on this.  Medication concerns reviewed with patient and pharmacy team. Barriers identified: CSW helping him with Medicaid as above.  Length of Stay: 3  Alford Highland, NP  01/02/2018, 7:45 AM  Advanced Heart Failure Team Pager [phone] (M-F; 7a - 4p)  Please contact [facility] for night-coverage after hours (4p -7a ) and weekends on [email or web address]  Patient seen with NP, agree with the above note.  He is clinically stable on milrinone.  Plan for CRT-D implantation today.   Marca Ancona 01/02/2018

## 2018-01-03 ENCOUNTER — Inpatient Hospital Stay (HOSPITAL_COMMUNITY): Payer: Medicaid Other

## 2018-01-03 ENCOUNTER — Encounter (HOSPITAL_COMMUNITY): Payer: Self-pay | Admitting: Internal Medicine

## 2018-01-03 LAB — BASIC METABOLIC PANEL
ANION GAP: 7 (ref 5–15)
BUN: 14 mg/dL (ref 6–20)
CALCIUM: 8.3 mg/dL — AB (ref 8.9–10.3)
CO2: 26 mmol/L (ref 22–32)
Chloride: 104 mmol/L (ref 98–111)
Creatinine, Ser: 0.84 mg/dL (ref 0.61–1.24)
GLUCOSE: 91 mg/dL (ref 70–99)
Potassium: 3.9 mmol/L (ref 3.5–5.1)
Sodium: 137 mmol/L (ref 135–145)

## 2018-01-03 LAB — LIPID PANEL
Cholesterol: 149 mg/dL (ref 0–200)
HDL: 42 mg/dL (ref >40)
LDL CALC: 98 mg/dL (ref 0–99)
Total CHOL/HDL Ratio: 3.5 RATIO
Triglycerides: 43 mg/dL (ref <150)
VLDL: 9 mg/dL (ref 0–40)

## 2018-01-03 LAB — CBC
HCT: 46.5 % (ref 39.0–52.0)
Hemoglobin: 16.4 g/dL (ref 13.0–17.0)
MCH: 33.3 pg (ref 26.0–34.0)
MCHC: 35.3 g/dL (ref 30.0–36.0)
MCV: 94.5 fL (ref 78.0–100.0)
PLATELETS: 164 10*3/uL (ref 150–400)
RBC: 4.92 MIL/uL (ref 4.22–5.81)
RDW: 13.2 % (ref 11.5–15.5)
WBC: 9.6 10*3/uL (ref 4.0–10.5)

## 2018-01-03 LAB — LACTATE DEHYDROGENASE: LDH: 304 U/L — AB (ref 98–192)

## 2018-01-03 LAB — HEMOGLOBIN A1C
Hgb A1c MFr Bld: 5.4 % (ref 4.8–5.6)
Mean Plasma Glucose: 108.28 mg/dL

## 2018-01-03 LAB — COOXEMETRY PANEL
Carboxyhemoglobin: 1.5 % (ref 0.5–1.5)
METHEMOGLOBIN: 1.7 % — AB (ref 0.0–1.5)
O2 SAT: 64.5 %
TOTAL HEMOGLOBIN: 13.3 g/dL (ref 12.0–16.0)

## 2018-01-03 LAB — URIC ACID: URIC ACID, SERUM: 8.5 mg/dL (ref 3.7–8.6)

## 2018-01-03 LAB — PREALBUMIN: Prealbumin: 25.4 mg/dL (ref 18–38)

## 2018-01-03 LAB — ANTITHROMBIN III: AntiThromb III Func: 105 % (ref 75–120)

## 2018-01-03 LAB — T4, FREE: FREE T4: 1.11 ng/dL (ref 0.82–1.77)

## 2018-01-03 MED ORDER — FUROSEMIDE 10 MG/ML IJ SOLN
40.0000 mg | Freq: Once | INTRAMUSCULAR | Status: AC
  Administered 2018-01-03: 40 mg via INTRAVENOUS
  Filled 2018-01-03: qty 4

## 2018-01-03 MED ORDER — FUROSEMIDE 40 MG PO TABS
40.0000 mg | ORAL_TABLET | Freq: Every evening | ORAL | Status: DC
  Administered 2018-01-04 – 2018-01-05 (×2): 40 mg via ORAL
  Filled 2018-01-03 (×2): qty 1

## 2018-01-03 MED ORDER — HYDROCORTISONE 1 % EX CREA
1.0000 "application " | TOPICAL_CREAM | Freq: Three times a day (TID) | CUTANEOUS | Status: DC | PRN
  Filled 2018-01-03: qty 28

## 2018-01-03 NOTE — Progress Notes (Signed)
Initial Encounter with LVAD Team and MCS Introduction:  Carl Ferguson is a 52 y.o. male whom  has a past medical history of CHF (congestive heart failure) (HCC), COPD (chronic obstructive pulmonary disease) (HCC), Dyspnea, and Nonischemic cardiomyopathy (HCC).. We have been asked to evaluate the patient for advanced therapies which include Left Ventricular Assist Device implantation.   Lab Results  Component Value Date   ABORH B POS 01/01/2018   ABORH  01/01/2018    B POS Performed at [facility], [address], [place], [place] [address]     No results found for: HGBA1C Lab Results  Component Value Date   CREATININE 0.84 01/03/2018   CREATININE 1.04 01/02/2018   CREATININE 1.03 01/01/2018    VAD educational packet including "A Decision Aid for Left Ventricular Assist Device (LVAD) for Destination Therapy",  "Understanding Your Options with Advanced Heart Failure", "Breese Patient Agreement for VAD Evaluation and Potential Implantation" consent, "Pepin HM II Patient Education" booklet, and [employer] "Living a More Active Life" HM III booklet reviewed in detail and left at bedside for continued reference. HM III Patient education DVD was given to patient as well for reference should they want to see more about the equipment at home after reviewing the information I left them.   Explained that LVAD can be implanted for two indications in the setting of advanced left ventricular heart failure treatment:  Bridge to transplant - used for patients who cannot safely wait for heart transplant without this device.  Or   Destination therapy - used for patients until end of life or recovery of heart function.  Discussed that at this point Carl Ferguson would be considered for Destination therapy should he be deemed an acceptable VAD candidate.   Provided brief equipment overview of the HeartMate III pump and discussed placement, surgical procedure, peripheral equipment,  life-long coumadin therapy, importance of medication adherence and clinic follow up for as long as patient is living on support, life-style modifications, as well as need for caregiver to be successful with this therapy.   The patient was receptive of our conversation and is open for evaluation for LVAD. We discussed the process of the evaluation period and how a decision was made by the [facility] team whether he would be an appropriate candidate for therapy or not. Evaluation consent was reviewed and signed to begin evaluation process.  Caregiver Support: Pt is identifying his girlfriend Carl Ferguson as his caregiver.  Home Inspection Checklist: verified that patient has reliable telephone, running water and electricity in the home.   Advised the patient review the materials, contact either myself, Hessie Diener or [place] with questions and we will plan to see him Monday.    Follow-Up Plan: pt currently does not have insurance, and does not have any form of income except for his girlfriend. He is currently receiving medications through our heart failure fund. Labs, CT scans, PFTs and other imagine were ordered today.   Session Time: 30 minutes  Carlton Adam RN, BSN VAD Coordinator 24/7 Pager [phone]

## 2018-01-03 NOTE — Progress Notes (Addendum)
Advanced Heart Failure Rounding Note  PCP-Cardiologist: Garwin Brothers, MD   Subjective:    Started on milrinone 0.25 mcg/kg/min 9/16  for low output on cath. Coox 65% this am.  Underwent ICD implant 9/19. Unable to place LV lead, had His lead placed but this has not significantly narrowed his QRS. Asensed, Vpaced on tele.  Weight up ?4 lbs. Good diuresis yesterday on PO lasix. CVP 9-10. SBP 100s. Creatinine stable.  Denies CP or SOB. Right chest ICD site sore.   RHC 12/30/17: RA mean 3 RV 35/6 PA 41/20, mean 30 PCWP mean 16 Oxygen saturations: PA 56% AO 97% Cardiac Output (Fick) 2.5  Cardiac Index (Fick) 1.4 PVR 5.6 WU  Objective:   Weight Range: 66 kg Body mass index is 22.79 kg/m.   Vital Signs:   Temp:  [97.6 F (36.4 C)-98.7 F (37.1 C)] 98.7 F (37.1 C) (09/20 0320) Pulse Rate:  [0-196] 97 (09/20 0320) Resp:  [0-103] 22 (09/20 0452) BP: (83-139)/(52-103) 106/67 (09/20 0320) SpO2:  [0 %-99 %] 99 % (09/20 0320) Weight:  [66 kg] 66 kg (09/20 0452) Last BM Date: 01/01/18  Weight change: Filed Weights   01/01/18 0444 01/02/18 0614 01/03/18 0452  Weight: 64.5 kg 64.4 kg 66 kg    Intake/Output:   Intake/Output Summary (Last 24 hours) at 01/03/2018 0745 Last data filed at 01/03/2018 0322 Gross per 24 hour  Intake 150.79 ml  Output 2300 ml  Net -2149.21 ml      Physical Exam    General: Well appearing. No resp difficulty. HEENT: Normal Neck: Supple. JVP 9-10. Carotids 2+ bilat; no bruits. No thyromegaly or nodule noted. Cor: PMI nondisplaced. RRR, +S3, 2/6 HSM apex Lungs: CTAB, normal effort. Abdomen: Soft, non-tender, non-distended, no HSM. No bruits or masses. +BS  Extremities: No cyanosis, clubbing, or rash. R and LLE no edema.  Neuro: Alert & orientedx3, cranial nerves grossly intact. moves all 4 extremities w/o difficulty. Affect pleasant   Telemetry   A sensed, V paced 90-100s. Personally reviewed.   EKG    A sensed V paced 101  bpm. Personally reviewed.  Labs    CBC Recent Labs    01/03/18 0517  WBC 9.6  HGB 16.4  HCT 46.5  MCV 94.5  PLT 164   Basic Metabolic Panel Recent Labs    57/84/69 0330 01/03/18 0517  NA 135 137  K 4.1 3.9  CL 99 104  CO2 26 26  GLUCOSE 99 91  BUN 21* 14  CREATININE 1.04 0.84  CALCIUM 9.4 8.3*   Liver Function Tests No results for input(s): AST, ALT, ALKPHOS, BILITOT, PROT, ALBUMIN in the last 72 hours. No results for input(s): LIPASE, AMYLASE in the last 72 hours. Cardiac Enzymes No results for input(s): CKTOTAL, CKMB, CKMBINDEX, TROPONINI in the last 72 hours.  BNP: BNP (last 3 results) Recent Labs    06/13/17 2143 07/02/17 1531 12/30/17 0858  BNP 1,699.9* 532.9* 4,087.0*    ProBNP (last 3 results) No results for input(s): PROBNP in the last 8760 hours.   D-Dimer No results for input(s): DDIMER in the last 72 hours. Hemoglobin A1C No results for input(s): HGBA1C in the last 72 hours. Fasting Lipid Panel No results for input(s): CHOL, HDL, LDLCALC, TRIG, CHOLHDL, LDLDIRECT in the last 72 hours. Thyroid Function Tests No results for input(s): TSH, T4TOTAL, T3FREE, THYROIDAB in the last 72 hours.  Invalid input(s): FREET3  Other results:   Imaging    No results found.  Medications:     Scheduled Medications: . aspirin EC  81 mg Oral Daily  . digoxin  0.125 mg Oral Daily  . enoxaparin (LOVENOX) injection  40 mg Subcutaneous Q24H  . furosemide  40 mg Oral QPM  . furosemide  80 mg Oral Q breakfast  . pantoprazole  40 mg Oral Daily  . potassium chloride SA  40 mEq Oral Daily  . sacubitril-valsartan  1 tablet Oral BID  . sodium chloride flush  10-40 mL Intracatheter Q12H  . sodium chloride flush  3 mL Intravenous Q12H  . spironolactone  25 mg Oral Daily    Infusions: . sodium chloride    . milrinone 0.25 mcg/kg/min (01/02/18 2300)    PRN Medications: sodium chloride, acetaminophen, albuterol, ondansetron (ZOFRAN) IV,  oxyCODONE-acetaminophen, sodium chloride flush, sodium chloride flush    Patient Profile   Carl Ferguson is a 52 y.o. male with systolic CHF, NICM, Chronic LBBB, Tobacco abuse, prior ETOH abuse, prior substance abuse, and h/o of device placements with extractions due to infection.  Admitted after schedule RHC yesterday with low output and started on milrinone.   Assessment/Plan   1. Chronic Systolic CHF:  - Nonischemic cardiomyopathy, diagnosed in 2011, initially followed at [place]/[facility].  LHC (3/19) with no significant CAD.  CPX (3/19) submaximal, but suggestive of mild to moderate HF limitation. HIV negative 3/19, history of drug/ETOH abuse in the past, no drugs now and has cut back ETOH considerably.  Echo was done today and reviewed, EF 20% with severe LV dilation, severe MR.  NYHA class IIIb symptoms.  He is volume overloaded on exam. He has a wide LBBB.  He does not have an ICD, 2 subcutaneous ICD systems were removed for infection at [facility].  - Low output HF on cath as above.  - Coox 65% today on milrinone 0.25 mcg/kg/min. CVP 9-10.  Decrease milrinone to 0.125 mcg/kg/min. May not be able to tolerate wean, but has no insurance. I have let Annice Pih with CSW know about him. CM following. - Continue lasix 80 mg am, 40 mg pm.  - Continue spironolactone 25 daily - Continue Entresto 24/26 bid. No BP room to titrate. SBP 100s - Continue Toprol XL 25 mg daily (reduced dose) with low output.  - Continue digoxin 0.125 daily. Dig level <0.2 9/16. 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 Underwent ICD implant 9/19. Unable to place LV lead for CRT, has his bundle lead that is pacing but has not narrowed QRS. May need to consider Mitraclip (would need TEE). - He would likely be an LVAD candidate if we cannot get him off milrinone.  Will need to work on getting him on Medicaid. He will not be a transplant candidate at  this time due to active smoking.  - Cardiac rehab following. 2. ETOH Abuse:  - Encouraged complete cessation. No change 3. Tobacco abuse  - Encouraged complete cessation. No change. 4. Mitral regurgitation:  - Severe MR on echo 12/26/17 MR appears central, and occurs in the setting of severe LV dilation as well as septal-lateral dyssynchrony with wide LBBB.  - CRT may decrease MR. Unable to place LV lead for CRT. Now has ICD 9/19. - Consider Mitraclip versus straight to LVAD.  5. Social - Have referred to social worker to help him get Medicaid. He has been working with DSS in [place]. Has also applied for disability and plans to call and check  on this.  - Per CM, he would be a self pay if he needs home milrinone. Antibiotics can be paid through charity fund if he needs at [place].  Medication concerns reviewed with patient and pharmacy team. Barriers identified: CSW helping him with Medicaid as above.  Length of Stay: 4  Alford Highland, NP  01/03/2018, 7:45 AM  Advanced Heart Failure Team Pager [phone] (M-F; 7a - 4p)  Please contact [facility] for night-coverage after hours (4p -7a ) and weekends on [email or web address]  Patient seen with NP, agree with the above note.  Co-ox remains good at 65% on milrinone 0.25, CVP is up a bit to 9-10 today. He did not get am Lasix yesterday with procedure.  He now has an ICD with His bundle lead.  Unable to place LV lead.  He is His pacing but this has not narrowed his QRS and is not likely to help his LV function or MR.    With inability to achieve CRT, I am concerned that he is going to be milrinone dependent.  This is an issue as we are still working on his insurance coverage (Medicaid pending).  Will attempt wean of milrinone to 0.125 today and can try to stop over weekend, but suspect this will be unsuccessful.  At this point, long-term I think that he will need an LVAD.  He does have very severe MR, so will plan TEE on Monday and discussion with  Dr. Excell Seltzer about whether he would be a Mitraclip candidate, as this may allow some improvement.   CVP a little higher, did not get am Lasix yesterday.  Will replace pm po Lasix with Lasix 80 mg IV x 1.    Will need to start LVAD evaluation and will need ongoing assistance from social work regarding insurance.   Marca Ancona 01/03/2018 1:30 PM

## 2018-01-03 NOTE — Progress Notes (Addendum)
Progress Note  Patient Name: Carl Ferguson Date of Encounter: 01/03/2018  Primary Cardiologist: Carl Brothers, MD   Subjective   Feels "OK", no CP no rest SOB.  Inpatient Medications    Scheduled Meds: . aspirin EC  81 mg Oral Daily  . digoxin  0.125 mg Oral Daily  . enoxaparin (LOVENOX) injection  40 mg Subcutaneous Q24H  . furosemide  40 mg Oral QPM  . furosemide  80 mg Oral Q breakfast  . pantoprazole  40 mg Oral Daily  . potassium chloride SA  40 mEq Oral Daily  . sacubitril-valsartan  1 tablet Oral BID  . sodium chloride flush  10-40 mL Intracatheter Q12H  . sodium chloride flush  3 mL Intravenous Q12H  . spironolactone  25 mg Oral Daily   Continuous Infusions: . sodium chloride    . milrinone 0.125 mcg/kg/min (01/03/18 0824)   PRN Meds: sodium chloride, acetaminophen, albuterol, ondansetron (ZOFRAN) IV, oxyCODONE-acetaminophen, sodium chloride flush, sodium chloride flush   Vital Signs    Vitals:   01/02/18 2326 01/03/18 0320 01/03/18 0452 01/03/18 0800  BP: 102/79 106/67  97/81  Pulse: (!) 107 97    Resp: (!) 23 (!) 26 (!) 22 16  Temp: 97.6 F (36.4 C) 98.7 F (37.1 C)  (!) 97.5 F (36.4 C)  TempSrc: Oral Oral  Oral  SpO2: 99% 99%  99%  Weight:   66 kg   Height:        Intake/Output Summary (Last 24 hours) at 01/03/2018 0907 Last data filed at 01/03/2018 0824 Gross per 24 hour  Intake 150.79 ml  Output 2550 ml  Net -2399.21 ml   Filed Weights   01/01/18 0444 01/02/18 0614 01/03/18 0452  Weight: 64.5 kg 64.4 kg 66 kg    Telemetry    SR/VP rhythm, infrequent PVCs- Personally Reviewed  ECG    SR/VP - Personally Reviewed  Physical Exam   GEN: No acute distress.   Neck: 7 cm JVD Cardiac: RRR with S3, soft systolic at base, no rubs  Respiratory: CTA b/l GI: Soft, nontender, non-distended  MS: No edema; No deformity. Neuro:  Nonfocal  Psych: Normal affect  R chest: implant site: dressing is dry, no hematoma  Labs     Chemistry Recent Labs  Lab 12/30/17 0858  01/01/18 0340 01/02/18 0330 01/03/18 0517  NA 135   < > 136 135 137  K 4.8   < > 4.3 4.1 3.9  CL 107   < > 99 99 104  CO2 19*   < > 26 26 26   GLUCOSE 114*   < > 96 99 91  BUN 17   < > 20 21* 14  CREATININE 1.03   < > 1.03 1.04 0.84  CALCIUM 9.3   < > 9.4 9.4 8.3*  PROT 6.8  --   --   --   --   ALBUMIN 3.8  --   --   --   --   AST 17  --   --   --   --   ALT 12  --   --   --   --   ALKPHOS 97  --   --   --   --   BILITOT 0.9  --   --   --   --   GFRNONAA >60   < > >60 >60 >60  GFRAA >60   < > >60 >60 >60  ANIONGAP 9   < >  11 10 7    < > = values in this interval not displayed.     Hematology Recent Labs  Lab 12/30/17 0858 01/03/18 0517  WBC 9.8 9.6  RBC 4.74 4.92  HGB 15.7 16.4  HCT 44.6 46.5  MCV 94.1 94.5  MCH 33.1 33.3  MCHC 35.2 35.3  RDW 13.6 13.2  PLT 210 164    Cardiac EnzymesNo results for input(s): TROPONINI in the last 168 hours. No results for input(s): TROPIPOC in the last 168 hours.   BNP Recent Labs  Lab 12/30/17 0858  BNP 4,087.0*     DDimer No results for input(s): DDIMER in the last 168 hours.   Radiology    Dg Chest 2 View Result Date: 01/03/2018 CLINICAL DATA:  Status post defibrillator placement EXAM: CHEST - 2 VIEW COMPARISON:  12/31/2017 FINDINGS: Defibrillator is now seen in satisfactory position. No pneumothorax is noted. Left-sided PICC line is noted in satisfactory position. Cardiac shadow remains enlarged. No focal infiltrate is seen. IMPRESSION: Status post defibrillator placement without acute abnormality. Electronically Signed   By: Carl Ferguson M.D.   On: 01/03/2018 08:42    Cardiac Studies   12/30/17: RHC 1. Filling pressures nearly optimized.  2. Markedly low cardiac output.    12/26/17: TTE - Left ventricle: The cavity size was severely dilated. Systolic   function was severely reduced. The estimated ejection fraction   was in the range of 15% to 20%. Diffuse hypokinesis.  Features are   consistent with a pseudonormal left ventricular filling pattern,   with concomitant abnormal relaxation and increased filling   pressure (grade 2 diastolic dysfunction). - Aortic valve: There was mild regurgitation. - Mitral valve: Mildly thickened, mildly calcified leaflets . There   was severe regurgitation. - Left atrium: The atrium was severely dilated. Volume/bsa, ES,   (1-plane Simpson&'s, A2C): 58.3 ml/m^2. - Tricuspid valve: There was mild regurgitation. - Pulmonary arteries: PA peak pressure: 46 mm Hg (S). Impressions: - Compared to the prior study, there has been no significant   interval change.  Patient Profile     52 y.o. male admitted after right heart cath with low CO failure, PMHx includes systolic CHF, NICM, Chronic LBBB, Tobacco abuse, prior ETOH abuse, prior substance abuse, and h/o of device placements with extractions due to infection.  Assessment & Plan    1. Acute on chronic systolic heart failure      Feeling better     C/w AHF team  2. 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      He is at increased risk for infection.      He is now s/p ICD implant yesterday with Dr. Ladona Ferguson     Unfortunately anatomy would not allow for adequate LV lead postioning, he has RA, HIS pacing leads, RV ICD lead in place      Device check this morning with intact functio      CXR without ptx      Site is stable      Wound care and activity restrictions d/w with the patient      Routine EP/device implant follow up is in place  PLEASE REMOVE TEGADERM/OUTTER BANDAGE DAY OF DISCHARGE, steri strips remain in place until wound check visit   EP will sign off though remain available, please recall if needed.    For questions or updates, please contact [facility] Please consult [email or web address] for contact info  under Cardiology/STEMI.      Signed, Carl Pigeon, PA-C  01/03/2018, 9:07 AM  Patient ID:  Carl Ferguson, male   DOB: Aug 14, 1965, 52 y.o.   MRN: [id number]   EP Attending  Patient seen and examined. Agree with above. ICD interogation demonstrates normal device function. His his bundle lead is septal. We could not recruit the LB despite multiple attempts. He will be discharged when ok with Dr. [name].  Usual followup for his device.  Carl Ferguson.D.

## 2018-01-03 NOTE — Care Management Note (Signed)
Case Management Note  Patient Details  Name: SHLOIMY BOREN MRN: [id number] Date of Birth: [birth year]  Subjective/Objective:   Pt was admitted post RHC yesterday for failed ICD                   Action/Plan:  PTA independent from home.     Expected Discharge Date:                  Expected Discharge Plan:     In-House Referral:     Discharge planning Services  CM Consult  Post Acute Care Choice:    Choice offered to:     DME Arranged:    DME Agency:     HH Arranged:    HH Agency:     Status of Service:  In process, will continue to follow  If discussed at Long Length of Stay Meetings, dates discussed:    Additional Comments: 01/03/2018  Pt now being considered for LVAD, still on milrinone.    01/02/18 Pt is currently from home, plan is to wean milrinone post new ICD placement. CM will continue to follow  Cherylann Parr, RN 01/03/2018, 3:35 PM

## 2018-01-03 NOTE — Discharge Instructions (Signed)
° ° °  Supplemental Discharge Instructions for  Pacemaker/Defibrillator Patients  Activity No heavy lifting or vigorous activity with your left/right arm for 6 to 8 weeks.  Do not raise your left/right arm above your head for one week.  Gradually raise your affected arm as drawn below.              01/06/18                    01/07/18                    01/08/18                   01/09/18 __  NO DRIVING for 1 week  ; you may begin driving on 2/40/97  .  WOUND CARE - Keep the wound area clean and dry.  Do not get this area wet, no showers until cleared to at your wound check visit - The tape/steri-strips on your wound will fall off; do not pull them off.  No bandage is needed on the site.  DO  NOT apply any creams, oils, or ointments to the wound area. - If you notice any drainage or discharge from the wound, any swelling or bruising at the site, or you develop a fever > 101? F after you are discharged home, call the office at once.  Special Instructions - You are still able to use cellular telephones; use the ear opposite the side where you have your pacemaker/defibrillator.  Avoid carrying your cellular phone near your device. - When traveling through airports, show security personnel your identification card to avoid being screened in the metal detectors.  Ask the security personnel to use the hand wand. - Avoid arc welding equipment, MRI testing (magnetic resonance imaging), TENS units (transcutaneous nerve stimulators).  Call the office for questions about other devices. - Avoid electrical appliances that are in poor condition or are not properly grounded. - Microwave ovens are safe to be near or to operate.  Additional information for defibrillator patients should your device go off: - If your device goes off ONCE and you feel fine afterward, notify the device clinic nurses. - If your device goes off ONCE and you do not feel well afterward, call 911. - If your device goes off TWICE, call  911. - If your device goes off THREE times in one day, call 911.  DO NOT DRIVE YOURSELF OR A FAMILY MEMBER WITH A DEFIBRILLATOR TO THE HOSPITAL--CALL 911.

## 2018-01-03 NOTE — Progress Notes (Signed)
CARDIAC REHAB PHASE I   PRE:  Rate/Rhythm: 99 pacing    BP: sitting 108/89    SaO2:   MODE:  Ambulation: 800 ft   POST:  Rate/Rhythm: 106 pacing    BP: sitting 108/76     SaO2:   Pt ambulated without c/o. Steady. He is using his right arm to don socks and elevating it to shoulder level. We discussed this. It seems hard for him to remember. Encouraged walking. [id number]   Harriet Masson CES, ACSM 01/03/2018 3:02 PM

## 2018-01-04 ENCOUNTER — Inpatient Hospital Stay (HOSPITAL_COMMUNITY): Payer: Medicaid Other

## 2018-01-04 ENCOUNTER — Encounter (HOSPITAL_COMMUNITY): Payer: Self-pay | Admitting: Radiology

## 2018-01-04 LAB — COOXEMETRY PANEL
CARBOXYHEMOGLOBIN: 1.4 % (ref 0.5–1.5)
Carboxyhemoglobin: 1.5 % (ref 0.5–1.5)
Methemoglobin: 1.5 % (ref 0.0–1.5)
Methemoglobin: 1.4 % (ref 0.0–1.5)
O2 SAT: 54 %
O2 Saturation: 60.2 %
TOTAL HEMOGLOBIN: 16.6 g/dL — AB (ref 12.0–16.0)
Total hemoglobin: 17.5 g/dL — ABNORMAL HIGH (ref 12.0–16.0)

## 2018-01-04 LAB — BASIC METABOLIC PANEL
Anion gap: 10 (ref 5–15)
BUN: 17 mg/dL (ref 6–20)
CALCIUM: 9.4 mg/dL (ref 8.9–10.3)
CO2: 27 mmol/L (ref 22–32)
Chloride: 95 mmol/L — ABNORMAL LOW (ref 98–111)
Creatinine, Ser: 0.98 mg/dL (ref 0.61–1.24)
GFR calc Af Amer: >60 mL/min (ref >60)
GFR calc non Af Amer: >60 mL/min (ref >60)
GLUCOSE: 97 mg/dL (ref 70–99)
Potassium: 4.5 mmol/L (ref 3.5–5.1)
Sodium: 132 mmol/L — ABNORMAL LOW (ref 135–145)

## 2018-01-04 MED ORDER — IOHEXOL 300 MG/ML  SOLN
100.0000 mL | Freq: Once | INTRAMUSCULAR | Status: AC | PRN
  Administered 2018-01-04: 100 mL via INTRAVENOUS

## 2018-01-04 MED ORDER — ENSURE ENLIVE PO LIQD
237.0000 mL | ORAL | Status: DC
  Administered 2018-01-04 – 2018-01-10 (×5): 237 mL via ORAL

## 2018-01-04 NOTE — Progress Notes (Signed)
Initial Nutrition Assessment  DOCUMENTATION CODES:   Not applicable  INTERVENTION:  - Will order Ensure Enlive once/day, this supplement provides 350 kcal and 20 grams of protein. - Continue to encourage PO intakes.    NUTRITION DIAGNOSIS:   Increased nutrient needs related to acute illness as evidenced by estimated needs.  GOAL:   Patient will meet greater than or equal to 90% of their needs  MONITOR:   PO intake, Supplement acceptance, Weight trends, Labs, I & O's  REASON FOR ASSESSMENT:   Consult LVAD Eval  ASSESSMENT:   52 y.o. male with systolic CHF, NICM, chronic LBBB, tobacco abuse, prior ETOH abuse, prior substance abuse, and h/o of device placements with extractions due to infection. Pt has long complicated history of CHF. Care was initially in [place] and at [facility]. ECHO 9/12 showed EF is around 20% with severe dilation, severe central mitral regurgitation, and severe L atrial enlargement. Weight had gone down from 150 to 146 pounds in setting of poor appetite. Taking all medications except potassium. Usually smokes 1 pack cigarettes every 2 days. He has difficulty paying for medications. Occasional ETOH use, no drugs. He presented 9/16 for scheduled cath which showed low output heart failure. Pt will be admitted for milrinone initiation and EP consideration.  BMI indicates normal weight. Patient reports decreased appetite for the several weeks PTA but that appetite has been increasing. On 9/19 he was NPO for ICD insertion. Flow sheet indicates that on 9/18 he was able to consume 100% of meals (total of 1977 kcal, 63 grams of protein). Patient reports being able to eat most of breakfast this AM without any pain, nausea, difficulty chewing or swallowing. He reports feeling better overall since admission and that appetite has mainly returned to baseline.   Will order supplement to aid in adequate nutrition post-op and during hospitalization. Per chart review, weight at time of  admission was 148 lb and is currently 142 lb. Suspect this is d/t fluid loss with diuretics orders. Weight of 148 lb is consistent with weight since 07/02/17.   Medications reviewed; 40 mg IV Lasix x1 dose yesterday, 40 mg oral Lasix/day, 80 mg oral Lasix/day, 40 mEq oral K-Dur/day, 25 mg Aldactone/day.  Labs reviewed; Na: 132 mmol/L, Cl: 95 mmol/L.     NUTRITION - FOCUSED PHYSICAL EXAM:  Completed; no muscle or fat wasting noted at this time, mild edema to extremities.  Diet Order:   Diet Order            Diet Heart Room service appropriate? Yes; Fluid consistency: Thin  Diet effective now              EDUCATION NEEDS:   No education needs have been identified at this time  Skin:  Skin Assessment: Reviewed RN Assessment  Last BM:  9/20  Height:   Ht Readings from Last 1 Encounters:  12/30/17 5\' 7"  (1.702 m)    Weight:   Wt Readings from Last 1 Encounters:  01/04/18 64.8 kg    Ideal Body Weight:  67.27 kg  BMI:  Body mass index is 22.37 kg/m.  Estimated Nutritional Needs:   Kcal:  3568-6168  Protein:  70-80 grams  Fluid:  >/= 1.2 L/day      Trenton Gammon, MS, RD, LDN, [facility] Inpatient Clinical Dietitian Pager # [phone] After hours/weekend pager # [phone]

## 2018-01-04 NOTE — Progress Notes (Signed)
Advanced Heart Failure Rounding Note  PCP-Cardiologist: Garwin Brothers, MD   Subjective:    Events: -Started on milrinone 0.25 mcg/kg/min 9/16  for low output on cath (CI 1.4) - Underwent ICD implant 9/19. Unable to place LV lead, had His lead placed but this has not significantly narrowed his QRS.  Milrinone decreased to 0.125 yesterday. Co-ox 65 -> 54%. Feels worse. Now SOB with shaving. Cough has returned. May be having some diaphragmatic stim with ICD. No orthopnea or PND.   RHC 12/30/17: RA mean 3 RV 35/6 PA 41/20, mean 30 PCWP mean 16 Oxygen saturations: PA 56% AO 97% Cardiac Output (Fick) 2.5  Cardiac Index (Fick) 1.4 PVR 5.6 WU  Objective:   Weight Range: 64.8 kg Body mass index is 22.37 kg/m.   Vital Signs:   Temp:  [97.4 F (36.3 C)-98.3 F (36.8 C)] 97.9 F (36.6 C) (09/21 1206) Pulse Rate:  [90-102] 90 (09/21 1206) Resp:  [10-23] 10 (09/21 1206) BP: (88-101)/(69-78) 97/75 (09/21 1206) SpO2:  [99 %] 99 % (09/21 1206) Weight:  [64.8 kg] 64.8 kg (09/21 0426) Last BM Date: 01/03/18  Weight change: Filed Weights   01/02/18 0614 01/03/18 0452 01/04/18 0426  Weight: 64.4 kg 66 kg 64.8 kg    Intake/Output:   Intake/Output Summary (Last 24 hours) at 01/04/2018 1502 Last data filed at 01/04/2018 1000 Gross per 24 hour  Intake 503.08 ml  Output 1200 ml  Net -696.92 ml      Physical Exam    General:  Sitting in bed. No resp difficulty HEENT: normal Neck: supple. JVP 6 Carotids 2+ bilat; no bruits. No lymphadenopathy or thryomegaly appreciated. Cor: PMI nondisplaced. Regular rate & rhythm. 2/6 MR Lungs: clear Abdomen: soft, nontender, nondistended. No hepatosplenomegaly. No bruits or masses. Good bowel sounds. Extremities: no cyanosis, clubbing, rash, edema Neuro: alert & orientedx3, cranial nerves grossly intact. moves all 4 extremities w/o difficulty. Affect pleasant   Telemetry   A sensed, V paced 90-100s. Personally reviewed   Labs      CBC Recent Labs    01/03/18 0517  WBC 9.6  HGB 16.4  HCT 46.5  MCV 94.5  PLT 164   Basic Metabolic Panel Recent Labs    66/44/03 0517 01/04/18 0449  NA 137 132*  K 3.9 4.5  CL 104 95*  CO2 26 27  GLUCOSE 91 97  BUN 14 17  CREATININE 0.84 0.98  CALCIUM 8.3* 9.4   Liver Function Tests No results for input(s): AST, ALT, ALKPHOS, BILITOT, PROT, ALBUMIN in the last 72 hours. No results for input(s): LIPASE, AMYLASE in the last 72 hours. Cardiac Enzymes No results for input(s): CKTOTAL, CKMB, CKMBINDEX, TROPONINI in the last 72 hours.  BNP: BNP (last 3 results) Recent Labs    06/13/17 2143 07/02/17 1531 12/30/17 0858  BNP 1,699.9* 532.9* 4,087.0*    ProBNP (last 3 results) No results for input(s): PROBNP in the last 8760 hours.   D-Dimer No results for input(s): DDIMER in the last 72 hours. Hemoglobin A1C Recent Labs    01/03/18 1939  HGBA1C 5.4   Fasting Lipid Panel Recent Labs    01/03/18 1939  CHOL 149  HDL 42  LDLCALC 98  TRIG 43  CHOLHDL 3.5   Thyroid Function Tests No results for input(s): TSH, T4TOTAL, T3FREE, THYROIDAB in the last 72 hours.  Invalid input(s): FREET3  Other results:   Imaging    Dg Orthopantogram  Result Date: 01/04/2018 CLINICAL DATA:  Pre operative evaluation  for left ventricular assist device insertion. EXAM: ORTHOPANTOGRAM/PANORAMIC COMPARISON:  None. FINDINGS: There are multiple missing teeth. Caries in tooth 2 and 3. Periapical lucency of tooth 29. No other appreciable abnormalities. IMPRESSION: Dental caries of tooth 2 and tooth 3. Probable small periapical abscess around the roots of tooth 29. Electronically Signed   By: Francene Boyers M.D.   On: 01/04/2018 11:36   Dg Chest 2 View  Result Date: 01/04/2018 CLINICAL DATA:  Pre operative evaluation prior to left ventricular assist device insertion. EXAM: CHEST - 2 VIEW COMPARISON:  Chest x-ray dated 01/03/2018 FINDINGS: There is cardiomegaly. AICD in place.  PICC tip just below the carina in the superior vena cava. Pulmonary vascularity is normal. Lungs are clear. No bone abnormality. IMPRESSION: No acute abnormalities.  Chronic cardiomegaly. Electronically Signed   By: Francene Boyers M.D.   On: 01/04/2018 11:31   Ct Chest W Contrast  Result Date: 01/04/2018 CLINICAL DATA:  Nonischemic cardiomyopathy. Evaluation for insertion of left ventricular assist device. EXAM: CT CHEST, ABDOMEN, AND PELVIS WITH CONTRAST TECHNIQUE: Multidetector CT imaging of the chest, abdomen and pelvis was performed following the standard protocol during bolus administration of intravenous contrast. CONTRAST:  OMNIPAQUE IOHEXOL 300 MG/ML  SOLN COMPARISON:  Chest x-ray dated 01/03/2018 FINDINGS: CT CHEST FINDINGS Cardiovascular: Cardiomegaly with marked dilatation of the left ventricle. AICD in place. No pericardial effusion. Mediastinum/Nodes: No enlarged mediastinal, hilar, or axillary lymph nodes. Thyroid gland, trachea, and esophagus demonstrate no significant findings. Lungs/Pleura: Lungs are clear. No pleural effusion or pneumothorax. Musculoskeletal: No chest wall mass or suspicious bone lesions identified. CT ABDOMEN PELVIS FINDINGS Hepatobiliary: No focal liver abnormality is seen. No gallstones, gallbladder wall thickening, or biliary dilatation. Pancreas: Unremarkable. No pancreatic ductal dilatation or surrounding inflammatory changes. Spleen: Normal in size without focal abnormality. Adrenals/Urinary Tract: Adrenal glands are unremarkable. Kidneys are normal, without renal calculi, focal lesion, or hydronephrosis. Bladder is unremarkable. Stomach/Bowel: Stomach is within normal limits. Appendix appears normal. No evidence of bowel wall thickening, distention, or inflammatory changes. Vascular/Lymphatic: No significant vascular findings are present. No enlarged abdominal or pelvic lymph nodes. Reproductive: Prostate is unremarkable. Other: No abdominal wall hernia or  abnormality. No abdominopelvic ascites. Musculoskeletal: No acute abnormality. Severe degenerative disc disease at L4-5 and to a lesser degree at L5-S1. Osteophytes fuse the sacroiliac joints. IMPRESSION: 1. Cardiomegaly with marked dilatation of the left ventricle. 2. Otherwise normal CT scan of the chest. 3. No significant abnormality of the abdomen. Degenerative disc disease in the lower lumbar spine. Electronically Signed   By: Francene Boyers M.D.   On: 01/04/2018 11:44   Ct Abdomen Pelvis W Contrast  Result Date: 01/04/2018 CLINICAL DATA:  Nonischemic cardiomyopathy. Evaluation for insertion of left ventricular assist device. EXAM: CT CHEST, ABDOMEN, AND PELVIS WITH CONTRAST TECHNIQUE: Multidetector CT imaging of the chest, abdomen and pelvis was performed following the standard protocol during bolus administration of intravenous contrast. CONTRAST:  OMNIPAQUE IOHEXOL 300 MG/ML  SOLN COMPARISON:  Chest x-ray dated 01/03/2018 FINDINGS: CT CHEST FINDINGS Cardiovascular: Cardiomegaly with marked dilatation of the left ventricle. AICD in place. No pericardial effusion. Mediastinum/Nodes: No enlarged mediastinal, hilar, or axillary lymph nodes. Thyroid gland, trachea, and esophagus demonstrate no significant findings. Lungs/Pleura: Lungs are clear. No pleural effusion or pneumothorax. Musculoskeletal: No chest wall mass or suspicious bone lesions identified. CT ABDOMEN PELVIS FINDINGS Hepatobiliary: No focal liver abnormality is seen. No gallstones, gallbladder wall thickening, or biliary dilatation. Pancreas: Unremarkable. No pancreatic ductal dilatation or surrounding inflammatory changes. Spleen:  Normal in size without focal abnormality. Adrenals/Urinary Tract: Adrenal glands are unremarkable. Kidneys are normal, without renal calculi, focal lesion, or hydronephrosis. Bladder is unremarkable. Stomach/Bowel: Stomach is within normal limits. Appendix appears normal. No evidence of bowel wall thickening,  distention, or inflammatory changes. Vascular/Lymphatic: No significant vascular findings are present. No enlarged abdominal or pelvic lymph nodes. Reproductive: Prostate is unremarkable. Other: No abdominal wall hernia or abnormality. No abdominopelvic ascites. Musculoskeletal: No acute abnormality. Severe degenerative disc disease at L4-5 and to a lesser degree at L5-S1. Osteophytes fuse the sacroiliac joints. IMPRESSION: 1. Cardiomegaly with marked dilatation of the left ventricle. 2. Otherwise normal CT scan of the chest. 3. No significant abnormality of the abdomen. Degenerative disc disease in the lower lumbar spine. Electronically Signed   By: Francene Boyers M.D.   On: 01/04/2018 11:44     Medications:     Scheduled Medications: . aspirin EC  81 mg Oral Daily  . digoxin  0.125 mg Oral Daily  . enoxaparin (LOVENOX) injection  40 mg Subcutaneous Q24H  . feeding supplement (ENSURE ENLIVE)  237 mL Oral Q24H  . furosemide  40 mg Oral QPM  . furosemide  80 mg Oral Q breakfast  . pantoprazole  40 mg Oral Daily  . potassium chloride SA  40 mEq Oral Daily  . sacubitril-valsartan  1 tablet Oral BID  . sodium chloride flush  10-40 mL Intracatheter Q12H  . sodium chloride flush  3 mL Intravenous Q12H  . spironolactone  25 mg Oral Daily    Infusions: . sodium chloride    . milrinone 0.125 mcg/kg/min (01/04/18 0500)    PRN Medications: sodium chloride, acetaminophen, albuterol, hydrocortisone cream, ondansetron (ZOFRAN) IV, oxyCODONE-acetaminophen, sodium chloride flush, sodium chloride flush    Patient Profile   Carl Ferguson is a 52 y.o. male with systolic CHF, NICM, Chronic LBBB, Tobacco abuse, prior ETOH abuse, prior substance abuse, and h/o of device placements with extractions due to infection.  Admitted after schedule RHC yesterday with low output and started on milrinone.   Assessment/Plan   1. Chronic Systolic CHF:  - Nonischemic cardiomyopathy, diagnosed in 2011,  initially followed at [facility].  LHC (3/19) with no significant CAD.  CPX (3/19) submaximal, but suggestive of mild to moderate HF limitation. HIV negative 3/19, history of drug/ETOH abuse in the past, no drugs now and has cut back ETOH considerably. - Echo (12/26/17) EF 20% with severe LV dilation, severe MR.  NYHA class IIIb symptoms.  Underwent ICD implant 9/19. Unable to place LV lead for CRT, has His bundle lead that is pacing but has not narrowed QRS. May need to consider Mitraclip (would need TEE). - Milrinone decreased yesterday to 0.125. Co-ox dropped 65-54%. Feels worse today. Will recheck co-ox and if < 60% will increase back to 0.25 - Volume status ok. CVP 6. Continue lasix 80 mg am, 40 mg pm.  - Continue spironolactone 25 daily - Continue Entresto 24/26 bid. No BP room to titrate. SBP 100s - Toprol stopped with low output.  - Continue digoxin 0.125 daily. Dig level <0.2 9/16. - He would likely be an LVAD candidate if we cannot get him off milrinone.  Will need to work on getting him on Medicaid. He will not be a transplant candidate at this time due to active smoking.  - Cardiac rehab following. 2. ETOH Abuse:  - Encouraged complete cessation. No change 3. Tobacco abuse  - Encouraged complete cessation. No change. 4. Mitral regurgitation:  - Severe MR  on echo 12/26/17 MR appears central, and occurs in the setting of severe LV dilation as well as septal-lateral dyssynchrony with wide LBBB.  - CRT may decrease MR. Unable to place LV lead for CRT. Now has ICD with His lead 9/19. - He does have very severe MR. Plan is for TEE on Monday and discussion with Dr. Excell Seltzer about whether he would be a Mitraclip candidate, as this may allow some improvement. (versus straight to LVAD.) 5. Social - Have referred to social worker to help him get Medicaid. He has been working with DSS in [place]. Has also applied for disability and plans to call and check on this.  - Per CM, he would be a  self pay if he needs home milrinone. Antibiotics can be paid through charity fund if he needs at [place].  Medication concerns reviewed with patient and pharmacy team. Barriers identified: CSW helping him with Medicaid as above.  Length of Stay: 5  Arvilla Meres, MD  01/04/2018, 3:02 PM  Advanced Heart Failure Team Pager [phone] (M-F; 7a - 4p)  Please contact [facility] for night-coverage after hours (4p -7a ) and weekends on [email or web address]

## 2018-01-04 NOTE — Progress Notes (Signed)
Called by patient's RN, patient walked off unit without permission and was found outside of the hospital. Patient has been educated extensively about leaving the unit in the past and remains noncompliant. Patient was returned to room by staff.

## 2018-01-05 ENCOUNTER — Inpatient Hospital Stay (HOSPITAL_COMMUNITY): Payer: Medicaid Other

## 2018-01-05 DIAGNOSIS — Z01810 Encounter for preprocedural cardiovascular examination: Secondary | ICD-10-CM

## 2018-01-05 DIAGNOSIS — Z7189 Other specified counseling: Secondary | ICD-10-CM

## 2018-01-05 DIAGNOSIS — Z515 Encounter for palliative care: Secondary | ICD-10-CM

## 2018-01-05 LAB — BASIC METABOLIC PANEL
Anion gap: 11 (ref 5–15)
BUN: 17 mg/dL (ref 6–20)
CO2: 27 mmol/L (ref 22–32)
CREATININE: 1.08 mg/dL (ref 0.61–1.24)
Calcium: 9.6 mg/dL (ref 8.9–10.3)
Chloride: 93 mmol/L — ABNORMAL LOW (ref 98–111)
GFR calc Af Amer: >60 mL/min (ref >60)
Glucose, Bld: 93 mg/dL (ref 70–99)
POTASSIUM: 4.7 mmol/L (ref 3.5–5.1)
SODIUM: 131 mmol/L — AB (ref 135–145)

## 2018-01-05 LAB — COOXEMETRY PANEL
Carboxyhemoglobin: 1.7 % — ABNORMAL HIGH (ref 0.5–1.5)
Carboxyhemoglobin: 1.5 % (ref 0.5–1.5)
METHEMOGLOBIN: 0.8 % (ref 0.0–1.5)
METHEMOGLOBIN: 1.5 % (ref 0.0–1.5)
O2 Saturation: 60.9 %
O2 Saturation: 50.4 %
TOTAL HEMOGLOBIN: 16.4 g/dL — AB (ref 12.0–16.0)
Total hemoglobin: 15.4 g/dL (ref 12.0–16.0)

## 2018-01-05 MED ORDER — SODIUM CHLORIDE 0.9 % IV SOLN
INTRAVENOUS | Status: DC
  Administered 2018-01-05: 22:00:00 via INTRAVENOUS

## 2018-01-05 MED ORDER — NICOTINE 21 MG/24HR TD PT24
21.0000 mg | MEDICATED_PATCH | Freq: Every day | TRANSDERMAL | Status: DC
  Administered 2018-01-05 – 2018-01-11 (×7): 21 mg via TRANSDERMAL
  Filled 2018-01-05 (×7): qty 1

## 2018-01-05 MED ORDER — MILRINONE LACTATE IN DEXTROSE 20-5 MG/100ML-% IV SOLN
0.2500 ug/kg/min | INTRAVENOUS | Status: DC
  Administered 2018-01-05 – 2018-01-11 (×8): 0.25 ug/kg/min via INTRAVENOUS
  Filled 2018-01-05 (×7): qty 100

## 2018-01-05 NOTE — Progress Notes (Signed)
Pre-op Cardiac Surgery  Carotid Findings:  1-39% ICA plaquing.  Vertebral artery flow is antegrade.  Upper Extremity Right Left  Brachial Pressures 93T T (restricted)   Findings:      Lower  Extremity Right Left  Dorsalis Pedis 89T 94T  Anterior Tibial    Posterior Tibial 103T 101T  Ankle/Brachial Indices 1.11 1.09    Findings:  WNL

## 2018-01-05 NOTE — Progress Notes (Signed)
Patient ID: Carl Ferguson, male   DOB: 1965/11/21, 53 y.o.   MRN: [id number]     Advanced Heart Failure Rounding Note  PCP-Cardiologist: Garwin Brothers, MD   Subjective:    - Started on milrinone 0.25 mcg/kg/min 9/16  for low output on cath.  - Underwent ICD implant 9/19. Unable to place LV lead, had His lead placed but this has not significantly narrowed his QRS. A-sensed, V-paced on tele. - Milrinone decreased to 0.125 on 9/20  Patient has felt worse on milrinone 0.125, co-ox 52% yesterday am but repeat 60% in afternoon. It is 61% today. CVP 5-6.    He feels more short of breath, cough has come back.   RHC 12/30/17: RA mean 3 RV 35/6 PA 41/20, mean 30 PCWP mean 16 Oxygen saturations: PA 56% AO 97% Cardiac Output (Fick) 2.5  Cardiac Index (Fick) 1.4 PVR 5.6 WU  Objective:   Weight Range: 65.3 kg Body mass index is 22.55 kg/m.   Vital Signs:   Temp:  [97.8 F (36.6 C)-99.8 F (37.7 C)] 98.6 F (37 C) (09/22 0719) Pulse Rate:  [90-110] 106 (09/22 0719) Resp:  [10-19] 14 (09/22 0719) BP: (85-105)/(63-77) 99/77 (09/22 0300) SpO2:  [98 %-100 %] 98 % (09/22 0719) Weight:  [65.3 kg] 65.3 kg (09/22 0300) Last BM Date: 01/04/18  Weight change: Filed Weights   01/03/18 0452 01/04/18 0426 01/05/18 0300  Weight: 66 kg 64.8 kg 65.3 kg    Intake/Output:   Intake/Output Summary (Last 24 hours) at 01/05/2018 1012 Last data filed at 01/05/2018 0405 Gross per 24 hour  Intake 670 ml  Output -  Net 670 ml      Physical Exam    General: NAD Neck: JVP not elevated, no thyromegaly or thyroid nodule.  Lungs: Clear to auscultation bilaterally with normal respiratory effort. CV: Lateral PMI.  Heart mildly tachy, regular S1/S2, +S3, 2/6 HSM apex.  No peripheral edema.   Abdomen: Soft, nontender, no hepatosplenomegaly, no distention.  Skin: Intact without lesions or rashes.  Neurologic: Alert and oriented x 3.  Psych: Normal affect. Extremities: No clubbing or cyanosis.   HEENT: Normal.   Telemetry   A sensed, V paced 100s. Personally reviewed.   Labs    CBC Recent Labs    01/03/18 0517  WBC 9.6  HGB 16.4  HCT 46.5  MCV 94.5  PLT 164   Basic Metabolic Panel Recent Labs    78/29/56 0449 01/05/18 0346  NA 132* 131*  K 4.5 4.7  CL 95* 93*  CO2 27 27  GLUCOSE 97 93  BUN 17 17  CREATININE 0.98 1.08  CALCIUM 9.4 9.6   Liver Function Tests No results for input(s): AST, ALT, ALKPHOS, BILITOT, PROT, ALBUMIN in the last 72 hours. No results for input(s): LIPASE, AMYLASE in the last 72 hours. Cardiac Enzymes No results for input(s): CKTOTAL, CKMB, CKMBINDEX, TROPONINI in the last 72 hours.  BNP: BNP (last 3 results) Recent Labs    06/13/17 2143 07/02/17 1531 12/30/17 0858  BNP 1,699.9* 532.9* 4,087.0*    ProBNP (last 3 results) No results for input(s): PROBNP in the last 8760 hours.   D-Dimer No results for input(s): DDIMER in the last 72 hours. Hemoglobin A1C Recent Labs    01/03/18 1939  HGBA1C 5.4   Fasting Lipid Panel Recent Labs    01/03/18 1939  CHOL 149  HDL 42  LDLCALC 98  TRIG 43  CHOLHDL 3.5   Thyroid Function Tests No results for  input(s): TSH, T4TOTAL, T3FREE, THYROIDAB in the last 72 hours.  Invalid input(s): FREET3  Other results:   Imaging    Dg Orthopantogram  Result Date: 01/04/2018 CLINICAL DATA:  Pre operative evaluation for left ventricular assist device insertion. EXAM: ORTHOPANTOGRAM/PANORAMIC COMPARISON:  None. FINDINGS: There are multiple missing teeth. Caries in tooth 2 and 3. Periapical lucency of tooth 29. No other appreciable abnormalities. IMPRESSION: Dental caries of tooth 2 and tooth 3. Probable small periapical abscess around the roots of tooth 29. Electronically Signed   By: Francene Boyers M.D.   On: 01/04/2018 11:36   Dg Chest 2 View  Result Date: 01/04/2018 CLINICAL DATA:  Pre operative evaluation prior to left ventricular assist device insertion. EXAM: CHEST - 2 VIEW  COMPARISON:  Chest x-ray dated 01/03/2018 FINDINGS: There is cardiomegaly. AICD in place. PICC tip just below the carina in the superior vena cava. Pulmonary vascularity is normal. Lungs are clear. No bone abnormality. IMPRESSION: No acute abnormalities.  Chronic cardiomegaly. Electronically Signed   By: Francene Boyers M.D.   On: 01/04/2018 11:31   Ct Chest W Contrast  Result Date: 01/04/2018 CLINICAL DATA:  Nonischemic cardiomyopathy. Evaluation for insertion of left ventricular assist device. EXAM: CT CHEST, ABDOMEN, AND PELVIS WITH CONTRAST TECHNIQUE: Multidetector CT imaging of the chest, abdomen and pelvis was performed following the standard protocol during bolus administration of intravenous contrast. CONTRAST:  OMNIPAQUE IOHEXOL 300 MG/ML  SOLN COMPARISON:  Chest x-ray dated 01/03/2018 FINDINGS: CT CHEST FINDINGS Cardiovascular: Cardiomegaly with marked dilatation of the left ventricle. AICD in place. No pericardial effusion. Mediastinum/Nodes: No enlarged mediastinal, hilar, or axillary lymph nodes. Thyroid gland, trachea, and esophagus demonstrate no significant findings. Lungs/Pleura: Lungs are clear. No pleural effusion or pneumothorax. Musculoskeletal: No chest wall mass or suspicious bone lesions identified. CT ABDOMEN PELVIS FINDINGS Hepatobiliary: No focal liver abnormality is seen. No gallstones, gallbladder wall thickening, or biliary dilatation. Pancreas: Unremarkable. No pancreatic ductal dilatation or surrounding inflammatory changes. Spleen: Normal in size without focal abnormality. Adrenals/Urinary Tract: Adrenal glands are unremarkable. Kidneys are normal, without renal calculi, focal lesion, or hydronephrosis. Bladder is unremarkable. Stomach/Bowel: Stomach is within normal limits. Appendix appears normal. No evidence of bowel wall thickening, distention, or inflammatory changes. Vascular/Lymphatic: No significant vascular findings are present. No enlarged abdominal or pelvic lymph  nodes. Reproductive: Prostate is unremarkable. Other: No abdominal wall hernia or abnormality. No abdominopelvic ascites. Musculoskeletal: No acute abnormality. Severe degenerative disc disease at L4-5 and to a lesser degree at L5-S1. Osteophytes fuse the sacroiliac joints. IMPRESSION: 1. Cardiomegaly with marked dilatation of the left ventricle. 2. Otherwise normal CT scan of the chest. 3. No significant abnormality of the abdomen. Degenerative disc disease in the lower lumbar spine. Electronically Signed   By: Francene Boyers M.D.   On: 01/04/2018 11:44   Ct Abdomen Pelvis W Contrast  Result Date: 01/04/2018 CLINICAL DATA:  Nonischemic cardiomyopathy. Evaluation for insertion of left ventricular assist device. EXAM: CT CHEST, ABDOMEN, AND PELVIS WITH CONTRAST TECHNIQUE: Multidetector CT imaging of the chest, abdomen and pelvis was performed following the standard protocol during bolus administration of intravenous contrast. CONTRAST:  OMNIPAQUE IOHEXOL 300 MG/ML  SOLN COMPARISON:  Chest x-ray dated 01/03/2018 FINDINGS: CT CHEST FINDINGS Cardiovascular: Cardiomegaly with marked dilatation of the left ventricle. AICD in place. No pericardial effusion. Mediastinum/Nodes: No enlarged mediastinal, hilar, or axillary lymph nodes. Thyroid gland, trachea, and esophagus demonstrate no significant findings. Lungs/Pleura: Lungs are clear. No pleural effusion or pneumothorax. Musculoskeletal: No chest wall mass  or suspicious bone lesions identified. CT ABDOMEN PELVIS FINDINGS Hepatobiliary: No focal liver abnormality is seen. No gallstones, gallbladder wall thickening, or biliary dilatation. Pancreas: Unremarkable. No pancreatic ductal dilatation or surrounding inflammatory changes. Spleen: Normal in size without focal abnormality. Adrenals/Urinary Tract: Adrenal glands are unremarkable. Kidneys are normal, without renal calculi, focal lesion, or hydronephrosis. Bladder is unremarkable. Stomach/Bowel: Stomach is  within normal limits. Appendix appears normal. No evidence of bowel wall thickening, distention, or inflammatory changes. Vascular/Lymphatic: No significant vascular findings are present. No enlarged abdominal or pelvic lymph nodes. Reproductive: Prostate is unremarkable. Other: No abdominal wall hernia or abnormality. No abdominopelvic ascites. Musculoskeletal: No acute abnormality. Severe degenerative disc disease at L4-5 and to a lesser degree at L5-S1. Osteophytes fuse the sacroiliac joints. IMPRESSION: 1. Cardiomegaly with marked dilatation of the left ventricle. 2. Otherwise normal CT scan of the chest. 3. No significant abnormality of the abdomen. Degenerative disc disease in the lower lumbar spine. Electronically Signed   By: Francene Boyers M.D.   On: 01/04/2018 11:44     Medications:     Scheduled Medications: . aspirin EC  81 mg Oral Daily  . digoxin  0.125 mg Oral Daily  . enoxaparin (LOVENOX) injection  40 mg Subcutaneous Q24H  . feeding supplement (ENSURE ENLIVE)  237 mL Oral Q24H  . furosemide  40 mg Oral QPM  . furosemide  80 mg Oral Q breakfast  . pantoprazole  40 mg Oral Daily  . potassium chloride SA  40 mEq Oral Daily  . sacubitril-valsartan  1 tablet Oral BID  . sodium chloride flush  10-40 mL Intracatheter Q12H  . sodium chloride flush  3 mL Intravenous Q12H  . spironolactone  25 mg Oral Daily    Infusions: . sodium chloride      PRN Medications: sodium chloride, acetaminophen, albuterol, hydrocortisone cream, ondansetron (ZOFRAN) IV, oxyCODONE-acetaminophen, sodium chloride flush, sodium chloride flush    Patient Profile   Carl Ferguson is a 52 y.o. male with systolic CHF, NICM, Chronic LBBB, Tobacco abuse, prior ETOH abuse, prior substance abuse, and h/o of device placements with extractions due to infection.  Admitted after schedule RHC yesterday with low output and started on milrinone.   Assessment/Plan   1. Chronic Systolic CHF: Nonischemic  cardiomyopathy, diagnosed in 2011, initially followed at [facility].  LHC (3/19) with no significant CAD.  CPX (3/19) submaximal, but suggestive of mild to moderate HF limitation. HIV negative 3/19, history of drug/ETOH abuse in the past, no drugs now and has cut back ETOH considerably.  Echo this admission showed EF 20% with severe LV dilation, severe MR.  NYHA class IIIb symptoms with volume overload at admission.  He has a wide LBBB.  2 subcutaneous ICD systems were removed for infection at [facility]. He now has an ICD with His bundle lead.  Unable to place LV lead on 9/19.  He is His pacing but this has not narrowed his QRS and is not likely to help his LV function or MR.  With inability to achieve CRT, I am concerned that he is going to be milrinone dependent.  This is an issue as we are still working on his insurance coverage (Medicaid pending).  His milrinone has now been weaned to 0.125 with co-ox 61% today but he feels significantly worse.  CVP 5-6.   - I will try him off milrinone today.  Repeat co-ox in 1 hour, if co-ox < 56%, he will need to go back on milrinone 0.25 and will  have been proved milrinone dependent.  - Continue lasix 80 mg am, 40 mg pm.  - Continue spironolactone 25 daily - Continue Entresto 24/26 bid. No BP room to titrate. SBP 100s - Now off beta blocker with low output.  - Continue digoxin 0.125 daily. Dig level <0.2 9/16. 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 Underwent ICD implant 9/19. Unable to place LV lead for CRT, has his bundle lead that is pacing but has not narrowed QRS. May need to consider Mitraclip (would need TEE). - He will be an LVAD candidate.  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  In this situation, I am not sure that Mitraclip will be enough to get him off inotrope.  He will need to start LVAD workup.  - Cardiac rehab following. 2. Prior ETOH Abuse: Knows he  needs complete cessation. No change 3. Tobacco abuse:  Knows he needs complete cessation. No change. 4. Mitral regurgitation: Severe MR on echo 12/26/17 MR appears central, and occurs in the setting of severe LV dilation as well as septal-lateral dyssynchrony with wide LBBB.  - CRT may decrease MR but unable to place LV lead for CRT. Now has ICD 9/19. - Consider Mitraclip versus straight to LVAD.  Plan for TEE on Monday to assess MR.  5. Social: Have referred to social worker to help him get Medicaid. He has been working with DSS in [place]. Has also applied for disability and plans to call and check on this. Per CM, he would be a self pay if he needs home milrinone. Antibiotics can be paid through charity fund if he needs at DC.  Marca Ancona 01/05/2018 10:12 AM

## 2018-01-05 NOTE — Progress Notes (Signed)
VASCULAR LAB PRELIMINARY  PRELIMINARY  PRELIMINARY  PRELIMINARY  Bilateral lower extremity venous duplex completed.    Preliminary report:  There is no DVT or SVT noted in the bilateral lower extremities.  Astin Rape, RVT 01/05/2018, 12:59 PM

## 2018-01-05 NOTE — Progress Notes (Signed)
VASCULAR LAB PRELIMINARY  PRELIMINARY  PRELIMINARY  PRELIMINARY  Bilateral upper extremity venous duplex completed.    Preliminary report:  There is no obvious evidence of DVT or SVT noted in the visualized veins of the bilateral upper extremities.  Carl Ferguson, RVT 01/05/2018, 12:58 PM

## 2018-01-05 NOTE — Consult Note (Signed)
Consultation Note Date: 01/05/2018   Patient Name: Carl Ferguson  DOB: October 27, 1965  MRN: [id number]  Age / Sex: 52 y.o., male  PCP: Patient, No Pcp Per Referring Physician: Larey Dresser, MD  Reason for Consultation: LVAD eval  HPI/Patient Profile: 52 y.o. male  with past medical history of systolic CHF (EF 93-57%), LBBB, severe mitral regurgitation tobacco abuse, h/o ETOH abuse, h/o device placement with extraction d/t infection, h/o difficulty affording medications admitted on 12/30/2017 post heart cath for milrinone initiation. Poor candidate for AICD given h/o of failure d/t infection (AICD placed 01/02/18), consideration of Mitraclip, and LVAD workup underway.   Clinical Assessment and Goals of Care: I met today with Carl Ferguson. We discussed in full the questions of home milrinone, mitraclip, and LVAD. He seems to be pretty knowledgeable about all these options. He is anxious to get home but also is happy that there are plans in action for options to help make him feel better. He has been miserable at home with fatigue, SOB, insomnia/restlessness, and decreased appetite. He is feeling a little better now but believes that he feels worse now off of milrinone.   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 He has struggled with resources and affording medications and currently has no insurance. He has a girlfriend, Joycelyn Schmid, that he lives with and who would be his primary caregiver if LVAD is pursued (she is not present at bedside today).   During my visit his brother and sister-in-law arrive to bedside. They appear very close and supportive but they do live ~2.5 hrs away in [place]. They joke and laugh with Carl Ferguson but also had many good questions regarding his care and moving forward. I did discuss with them the basics of milrinone,  mitraclip, and LVAD. Spent much time discussing the process of LVAD workup and approval, recovery process, and care requirements post LVAD implant at home. Carl Ferguson is interested in LVAD but also aware that this will be a huge lifestyle adjustment. He has met LVAD pts recently in heart failure clinic.   Carl Ferguson appears to have good support and knowledge of LVAD. He is due for ultrasound now so we will continue our conversation tomorrow regarding [place] and advance care planning.   Primary Decision Maker PATIENT    SUMMARY OF RECOMMENDATIONS   - Will further discuss his goals with LVAD and advance care planning tomorrow - Needs f/u with CSW regarding obtaining Medicaid  Code Status/Advance Care Planning:  Full code   Symptom Management:   Per heart failure team.   He is requesting benadryl as opioids make him very itchy.   Palliative Prophylaxis:   Bowel Regimen, Delirium Protocol and Frequent Pain Assessment  Additional Recommendations (Limitations, Scope, Preferences):  Full Scope Treatment  Psycho-social/Spiritual:   Desire for further Chaplaincy support:yes  Additional Recommendations: Caregiving  Support/Resources and Medicaid/Financial Assistance  Prognosis:   Unable to determine  Discharge Planning: Home with Home  Health      Primary Diagnoses: Present on Admission: . Acute systolic CHF (congestive heart failure) (HCC)   I have reviewed the medical record, interviewed the patient and family, and examined the patient. The following aspects are pertinent.  Past Medical History:  Diagnosis Date  . CHF (congestive heart failure) (HCC)   . COPD (chronic obstructive pulmonary disease) (HCC)   . Dyspnea   . Nonischemic cardiomyopathy (HCC)    a. diagnosed in 2011 with EF 30% at that time b. EF improved to 50% by echo in 02/2016   Social History   Socioeconomic History  . Marital status: Single    Spouse name: Not on file  . Number of children: Not on  file  . Years of education: Not on file  . Highest education level: Not on file  Occupational History  . Not on file  Social Needs  . Financial resource strain: Not on file  . Food insecurity:    Worry: Not on file    Inability: Not on file  . Transportation needs:    Medical: Not on file    Non-medical: Not on file  Tobacco Use  . Smoking status: Current Some Day Smoker    Types: Cigarettes  . Smokeless tobacco: Never Used  Substance and Sexual Activity  . Alcohol use: Yes    Frequency: Never    Comment: 3-4 beers daily  . Drug use: No  . Sexual activity: Not on file  Lifestyle  . Physical activity:    Days per week: Not on file    Minutes per session: Not on file  . Stress: Not on file  Relationships  . Social connections:    Talks on phone: Not on file    Gets together: Not on file    Attends religious service: Not on file    Active member of club or organization: Not on file    Attends meetings of clubs or organizations: Not on file    Relationship status: Not on file  Other Topics Concern  . Not on file  Social History Narrative  . Not on file   Family History  Problem Relation Age of Onset  . Heart attack Mother   . Sudden Cardiac Death Neg Hx    Scheduled Meds: . aspirin EC  81 mg Oral Daily  . digoxin  0.125 mg Oral Daily  . enoxaparin (LOVENOX) injection  40 mg Subcutaneous Q24H  . feeding supplement (ENSURE ENLIVE)  237 mL Oral Q24H  . furosemide  40 mg Oral QPM  . furosemide  80 mg Oral Q breakfast  . pantoprazole  40 mg Oral Daily  . potassium chloride SA  40 mEq Oral Daily  . sacubitril-valsartan  1 tablet Oral BID  . sodium chloride flush  10-40 mL Intracatheter Q12H  . sodium chloride flush  3 mL Intravenous Q12H  . spironolactone  25 mg Oral Daily   Continuous Infusions: . sodium chloride     PRN Meds:.sodium chloride, acetaminophen, albuterol, hydrocortisone cream, ondansetron (ZOFRAN) IV, oxyCODONE-acetaminophen, sodium chloride flush,  sodium chloride flush Allergies  Allergen Reactions  . Norflex [Orphenadrine] Swelling  . Orphenadrine Citrate Swelling    Face swelling    Review of Systems  Constitutional: Positive for activity change, appetite change and fatigue.  Respiratory: Positive for cough and shortness of breath.     Physical Exam  Constitutional: He is oriented to person, place, and time. He appears well-developed.  HENT:  Head: Normocephalic and atraumatic.    Cardiovascular: Tachycardia present.  Paced   Pulmonary/Chest: No accessory muscle usage. No tachypnea. No respiratory distress.  Reports SOB with activity  Abdominal: Normal appearance.  Neurological: He is alert and oriented to person, place, and time.  Nursing note and vitals reviewed.   Vital Signs: BP 99/77 (BP Location: Right Arm)   Pulse (!) 106   Temp 98.6 F (37 C) (Oral)   Resp 14   Ht 5' 7" (1.702 m)   Wt 65.3 kg   SpO2 98%   BMI 22.55 kg/m  Pain Scale: 0-10 POSS *See Group Information*: 2-Acceptable,Slightly drowsy, easily aroused Pain Score: 0-No pain   SpO2: SpO2: 98 % O2 Device:SpO2: 98 % O2 Flow Rate: .O2 Flow Rate (L/min): 6 L/min  IO: Intake/output summary:   Intake/Output Summary (Last 24 hours) at 01/05/2018 1039 Last data filed at 01/05/2018 0405 Gross per 24 hour  Intake 670 ml  Output -  Net 670 ml    LBM: Last BM Date: 01/04/18 Baseline Weight: Weight: 67.1 kg Most recent weight: Weight: 65.3 kg     Palliative Assessment/Data:     Time In: 1045 Time Out: 1155 Time Total: 70 min Greater than 50%  of this time was spent counseling and coordinating care related to the above assessment and plan.  Signed by: Alicia Parker, NP Palliative Medicine Team Pager # [phone] (M-F 8a-5p) Team Phone # [phone] (Nights/Weekends)                

## 2018-01-06 ENCOUNTER — Inpatient Hospital Stay (HOSPITAL_COMMUNITY): Payer: Medicaid Other

## 2018-01-06 ENCOUNTER — Other Ambulatory Visit (HOSPITAL_COMMUNITY): Payer: Medicaid Other

## 2018-01-06 ENCOUNTER — Encounter (HOSPITAL_COMMUNITY)
Admission: RE | Disposition: A | Discharge status: Home Health | Payer: Self-pay | Source: Ambulatory Visit | Attending: Cardiology

## 2018-01-06 DIAGNOSIS — Z95810 Presence of automatic (implantable) cardiac defibrillator: Secondary | ICD-10-CM

## 2018-01-06 DIAGNOSIS — R6521 Severe sepsis with septic shock: Secondary | ICD-10-CM

## 2018-01-06 DIAGNOSIS — A419 Sepsis, unspecified organism: Secondary | ICD-10-CM

## 2018-01-06 DIAGNOSIS — Z888 Allergy status to other drugs, medicaments and biological substances status: Secondary | ICD-10-CM

## 2018-01-06 DIAGNOSIS — R509 Fever, unspecified: Secondary | ICD-10-CM

## 2018-01-06 DIAGNOSIS — Z8619 Personal history of other infectious and parasitic diseases: Secondary | ICD-10-CM

## 2018-01-06 DIAGNOSIS — F1911 Other psychoactive substance abuse, in remission: Secondary | ICD-10-CM

## 2018-01-06 DIAGNOSIS — I5021 Acute systolic (congestive) heart failure: Secondary | ICD-10-CM

## 2018-01-06 DIAGNOSIS — I428 Other cardiomyopathies: Secondary | ICD-10-CM

## 2018-01-06 LAB — CBC
HCT: 45.3 % (ref 39.0–52.0)
Hemoglobin: 16.1 g/dL (ref 13.0–17.0)
MCH: 33.5 pg (ref 26.0–34.0)
MCHC: 35.5 g/dL (ref 30.0–36.0)
MCV: 94.2 fL (ref 78.0–100.0)
PLATELETS: UNDETERMINED 10*3/uL (ref 150–400)
RBC: 4.81 MIL/uL (ref 4.22–5.81)
RDW: 12.3 % (ref 11.5–15.5)
WBC: 7.2 10*3/uL (ref 4.0–10.5)

## 2018-01-06 LAB — URINALYSIS, ROUTINE W REFLEX MICROSCOPIC
BILIRUBIN URINE: NEGATIVE
Bacteria, UA: NONE SEEN
GLUCOSE, UA: NEGATIVE mg/dL
Ketones, ur: NEGATIVE mg/dL
LEUKOCYTES UA: NEGATIVE
NITRITE: NEGATIVE
PH: 6 (ref 5.0–8.0)
PROTEIN: NEGATIVE mg/dL
Specific Gravity, Urine: 1.011 (ref 1.005–1.030)

## 2018-01-06 LAB — BASIC METABOLIC PANEL
Anion gap: 10 (ref 5–15)
BUN: 19 mg/dL (ref 6–20)
CALCIUM: 9.2 mg/dL (ref 8.9–10.3)
CO2: 26 mmol/L (ref 22–32)
CREATININE: 1.27 mg/dL — AB (ref 0.61–1.24)
Chloride: 93 mmol/L — ABNORMAL LOW (ref 98–111)
GFR calc Af Amer: >60 mL/min (ref >60)
GFR calc non Af Amer: >60 mL/min (ref >60)
GLUCOSE: 127 mg/dL — AB (ref 70–99)
Potassium: 5.3 mmol/L — ABNORMAL HIGH (ref 3.5–5.1)
Sodium: 129 mmol/L — ABNORMAL LOW (ref 135–145)

## 2018-01-06 LAB — HEPATITIS C ANTIBODY: HCV Ab: <0.1 {s_co_ratio} (ref 0.0–0.9)

## 2018-01-06 LAB — HEPATITIS B SURFACE ANTIBODY, QUANTITATIVE: Hepatitis B-Post: <3.1 m[IU]/mL — ABNORMAL LOW (ref >9.9)

## 2018-01-06 LAB — LUPUS ANTICOAGULANT PANEL
DRVVT: 30.6 s (ref 0.0–47.0)
PTT Lupus Anticoagulant: 36.1 s (ref 0.0–51.9)

## 2018-01-06 LAB — HEPATITIS B SURFACE ANTIGEN: Hepatitis B Surface Ag: NEGATIVE

## 2018-01-06 LAB — RAPID URINE DRUG SCREEN, HOSP PERFORMED
Amphetamines: NOT DETECTED
BARBITURATES: NOT DETECTED
Benzodiazepines: NOT DETECTED
Cocaine: NOT DETECTED
Opiates: NOT DETECTED
Tetrahydrocannabinol: NOT DETECTED

## 2018-01-06 LAB — COOXEMETRY PANEL
CARBOXYHEMOGLOBIN: 1.7 % — AB (ref 0.5–1.5)
Methemoglobin: 0.7 % (ref 0.0–1.5)
O2 SAT: 63.4 %
TOTAL HEMOGLOBIN: 16.3 g/dL — AB (ref 12.0–16.0)

## 2018-01-06 LAB — HEPATITIS B CORE ANTIBODY, TOTAL: HEP B C TOTAL AB: NEGATIVE

## 2018-01-06 SURGERY — ECHOCARDIOGRAM, TRANSESOPHAGEAL
Anesthesia: Moderate Sedation

## 2018-01-06 MED ORDER — AMIODARONE LOAD VIA INFUSION
150.0000 mg | Freq: Once | INTRAVENOUS | Status: AC
  Administered 2018-01-06: 150 mg via INTRAVENOUS
  Filled 2018-01-06: qty 83.34

## 2018-01-06 MED ORDER — AMIODARONE HCL IN DEXTROSE 360-4.14 MG/200ML-% IV SOLN
INTRAVENOUS | Status: AC
  Administered 2018-01-06: 60 mg/h via INTRAVENOUS
  Filled 2018-01-06: qty 200

## 2018-01-06 MED ORDER — AMIODARONE HCL IN DEXTROSE 360-4.14 MG/200ML-% IV SOLN
60.0000 mg/h | INTRAVENOUS | Status: AC
  Administered 2018-01-06: 60 mg/h via INTRAVENOUS
  Filled 2018-01-06: qty 200

## 2018-01-06 MED ORDER — AMIODARONE HCL IN DEXTROSE 360-4.14 MG/200ML-% IV SOLN
30.0000 mg/h | INTRAVENOUS | Status: DC
  Administered 2018-01-06 – 2018-01-07 (×2): 30 mg/h via INTRAVENOUS
  Filled 2018-01-06 (×2): qty 200

## 2018-01-06 MED ORDER — VANCOMYCIN HCL IN DEXTROSE 750-5 MG/150ML-% IV SOLN
750.0000 mg | Freq: Two times a day (BID) | INTRAVENOUS | Status: DC
  Administered 2018-01-06 – 2018-01-08 (×5): 750 mg via INTRAVENOUS
  Filled 2018-01-06 (×5): qty 150

## 2018-01-06 MED ORDER — SODIUM CHLORIDE 0.9 % IV SOLN
1.0000 g | Freq: Three times a day (TID) | INTRAVENOUS | Status: DC
  Administered 2018-01-06 – 2018-01-08 (×6): 1 g via INTRAVENOUS
  Filled 2018-01-06 (×7): qty 1

## 2018-01-06 MED ORDER — SODIUM CHLORIDE 0.9 % IV BOLUS
250.0000 mL | Freq: Once | INTRAVENOUS | Status: AC
  Administered 2018-01-06: 250 mL via INTRAVENOUS

## 2018-01-06 MED ORDER — SODIUM CHLORIDE 0.9 % IV SOLN
2.0000 g | Freq: Once | INTRAVENOUS | Status: AC
  Administered 2018-01-06: 2 g via INTRAVENOUS
  Filled 2018-01-06 (×2): qty 2

## 2018-01-06 NOTE — Progress Notes (Addendum)
PATIENT  Had temp of 103.1 f. Dr Caro Hight informed cold therapy being used at the moment oxygen commenced via nasal cannula . Rapid response team also informed, will come and assess patient. Care continues.

## 2018-01-06 NOTE — Progress Notes (Addendum)
Progress Note  Patient Name: Carl Ferguson Date of Encounter: 01/06/2018  Primary Cardiologist: Garwin Brothers, MD   Subjective   Feels better then a couple hours ago, no CP, no rest SOB.  No chills currently  Inpatient Medications    Scheduled Meds: . aspirin EC  81 mg Oral Daily  . digoxin  0.125 mg Oral Daily  . enoxaparin (LOVENOX) injection  40 mg Subcutaneous Q24H  . feeding supplement (ENSURE ENLIVE)  237 mL Oral Q24H  . nicotine  21 mg Transdermal Daily  . pantoprazole  40 mg Oral Daily  . sodium chloride flush  10-40 mL Intracatheter Q12H  . sodium chloride flush  3 mL Intravenous Q12H   Continuous Infusions: . sodium chloride    . sodium chloride 20 mL/hr at 01/05/18 2139  . amiodarone 60 mg/hr (01/06/18 0832)  . amiodarone    . ceFEPime (MAXIPIME) IV    . ceFEPime (MAXIPIME) IV    . milrinone 0.25 mcg/kg/min (01/06/18 1610)  . sodium chloride    . vancomycin     PRN Meds: sodium chloride, acetaminophen, albuterol, hydrocortisone cream, ondansetron (ZOFRAN) IV, oxyCODONE-acetaminophen, sodium chloride flush, sodium chloride flush   Vital Signs    Vitals:   01/06/18 0353 01/06/18 0639 01/06/18 0653 01/06/18 0751  BP: 107/86 (!) 114/94  90/74  Pulse: (!) 111 (!) 156  (!) 142  Resp: 12   13  Temp: 99.8 F (37.7 C) 99.8 F (37.7 C) (!) 103.1 F (39.5 C) (!) 100.5 F (38.1 C)  TempSrc: Oral Oral  Oral  SpO2: 99% 100%  95%  Weight: 66.7 kg     Height:        Intake/Output Summary (Last 24 hours) at 01/06/2018 0939 Last data filed at 01/06/2018 0000 Gross per 24 hour  Intake 726.71 ml  Output 550 ml  Net 176.71 ml   Filed Weights   01/04/18 0426 01/05/18 0300 01/06/18 0353  Weight: 64.8 kg 65.3 kg 66.7 kg    Telemetry    ST/V paced, infrequent PVCs- Personally Reviewed  ECG    Likely ST - Personally Reviewed  Physical Exam   GEN: No acute distress.   Neck: 7 cm JVD Cardiac: RRR, tachycardic with S3, soft systolic at base, no rubs    Respiratory: CTA b/l GI: Soft, nontender, non-distended  MS: No edema; No deformity. Neuro:  Nonfocal  Psych: Normal affect  R chest: implant site: dressing is dry, no hematoma, no erythema, edema, fluctuation or tenderness, not hot  Labs    Chemistry Recent Labs  Lab 01/04/18 0449 01/05/18 0346 01/06/18 0452  NA 132* 131* 129*  K 4.5 4.7 5.3*  CL 95* 93* 93*  CO2 27 27 26   GLUCOSE 97 93 127*  BUN 17 17 19   CREATININE 0.98 1.08 1.27*  CALCIUM 9.4 9.6 9.2  GFRNONAA >60 >60 >60  GFRAA >60 >60 >60  ANIONGAP 10 11 10      Hematology Recent Labs  Lab 01/03/18 0517 01/06/18 0452  WBC 9.6 7.2  RBC 4.92 4.81  HGB 16.4 16.1  HCT 46.5 45.3  MCV 94.5 94.2  MCH 33.3 33.5  MCHC 35.3 35.5  RDW 13.2 12.3  PLT 164 PLATELET CLUMPS NOTED ON SMEAR, UNABLE TO ESTIMATE    Cardiac EnzymesNo results for input(s): TROPONINI in the last 168 hours. No results for input(s): TROPIPOC in the last 168 hours.   BNP No results for input(s): BNP, PROBNP in the last 168 hours.   DDimer  No results for input(s): DDIMER in the last 168 hours.   Radiology    Dg Chest 2 View Result Date: 01/03/2018 CLINICAL DATA:  Status post defibrillator placement EXAM: CHEST - 2 VIEW COMPARISON:  12/31/2017 FINDINGS: Defibrillator is now seen in satisfactory position. No pneumothorax is noted. Left-sided PICC line is noted in satisfactory position. Cardiac shadow remains enlarged. No focal infiltrate is seen. IMPRESSION: Status post defibrillator placement without acute abnormality. Electronically Signed   By: Alcide Clever M.D.   On: 01/03/2018 08:42    Cardiac Studies   12/30/17: RHC 1. Filling pressures nearly optimized.  2. Markedly low cardiac output.    12/26/17: TTE - Left ventricle: The cavity size was severely dilated. Systolic   function was severely reduced. The estimated ejection fraction   was in the range of 15% to 20%. Diffuse hypokinesis. Features are   consistent with a pseudonormal left  ventricular filling pattern,   with concomitant abnormal relaxation and increased filling   pressure (grade 2 diastolic dysfunction). - Aortic valve: There was mild regurgitation. - Mitral valve: Mildly thickened, mildly calcified leaflets . There   was severe regurgitation. - Left atrium: The atrium was severely dilated. Volume/bsa, ES,   (1-plane Simpson&'s, A2C): 58.3 ml/m^2. - Tricuspid valve: There was mild regurgitation. - Pulmonary arteries: PA peak pressure: 46 mm Hg (S). Impressions: - Compared to the prior study, there has been no significant   interval change.  Patient Profile     52 y.o. male admitted after right heart cath with low CO failure, PMHx includes systolic CHF, NICM, Chronic LBBB, Tobacco abuse, prior ETOH abuse, prior substance abuse, and h/o of device placements with extractions due to infection.  EP recalled to case today with fever s/p ICD implant  Assessment & Plan    1. 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      He was an increased risk for infection given history     He is now s/p ICD implant 01/02/18 with Dr. Ladona Ridgel     Unfortunately anatomy would not allow for adequate LV lead postioning, he has RA, HIS pacing leads, RV ICD lead in place       Temp this AM 103.1 Tachycardic > amio gtt WBC this AM 7.2 BC drawn already this AM (x2), pending ABX (vanc and cefepime) started CXR clear  Sit looks good.  No hematoma, small area of ecchymosis, no erythema, edema, fluctuation or tenderness. No increased temp to surrounding tissues in comparison to chest otherwise.  I think early for device infection, though given history, agree with anx coverage Follow BC, site Agree with ID consult  Dr. Ladona Ridgel to see    For  questions or updates, please contact [facility] Please consult [email or web address] for contact info under Cardiology/STEMI.      Signed, Sheilah Pigeon, PA-C  01/06/2018, 9:39 AM  Patient ID: Carl Ferguson, male   DOB: 06/24/1965, 52 y.o.   MRN: [id number]   EP Attending  Patient seen and examined. Agree with the findings as noted above. The patient looks good this afternoon and his temperature has come down. I agree with removal of the PICC line. Would give IV antibiotics until  cultures are sterile. I have never seen an acute ICD/PPM infection. Typically these develop 6-8 weeks after implant. His PICC line was adjacent to his leads and hopefully this will not be a problem. Unclear if he needs IV amiodarone. I thought his rhythm most likely sinus tachycardia with ventricular pacing.   Leonia Reeves.D.

## 2018-01-06 NOTE — Progress Notes (Signed)
CSW met with pt at bedside to discuss current insurance status as part of LVAD work up.  Patient has applied for SSDI but was denied due to lack of work credits and is unsure about status of SSI.  CSW called and spoke with patient's Disability Determination worker, Conley Rolls [phone].  Aisha confirms patient's SSI application is in review.  CSW will continue to follow and assist as needed- will continue to follow up with SSI case worker regarding pt status.  Jorge Ny, LCSW Clinical Social Worker

## 2018-01-06 NOTE — Progress Notes (Signed)
Pharmacy Antibiotic Note  Carl Ferguson is a 52 y.o. male admitted on 12/30/2017 with fever.  Pharmacy has been consulted for cefepime dosing.  Pharmacy consulted to add vancomycin.  Plan: Vancomycin 750mg  IV q12h Vancomycin level as indicated  Height: 5\' 7"  (170.2 cm) Weight: 147 lb 0.8 oz (66.7 kg) IBW/kg (Calculated) : 66.1  Temp (24hrs), Avg:99.5 F (37.5 C), Min:97.7 F (36.5 C), Max:103.1 F (39.5 C)  Recent Labs  Lab 01/02/18 0330 01/03/18 0517 01/04/18 0449 01/05/18 0346 01/06/18 0452  WBC  --  9.6  --   --  7.2  CREATININE 1.04 0.84 0.98 1.08 1.27*    Estimated Creatinine Clearance: 65.1 mL/min (A) (by C-G formula based on SCr of 1.27 mg/dL (H)).    Allergies  Allergen Reactions  . Norflex [Orphenadrine] Swelling  . Orphenadrine Citrate Swelling    Face swelling      Thank you for allowing pharmacy to be a part of this patient's care.  Fredonia Highland, PharmD, BCPS Clinical Pharmacist [phone] Please check AMION for all [facility] Pharmacy numbers 01/06/2018

## 2018-01-06 NOTE — Progress Notes (Addendum)
Palliative:  I met again today with Carl Ferguson. Carl Ferguson is frustrated that he has spiked fever and that TEE and testing is now postponed (ID to be consulted with h/o infection post device implantation and currently). However, he feels confident in his medical team and that they are doing everything they can to help him with options to improve his quality and quantity of life. He says he is in no rush to leave the hospital. He is appreciative of Jackie/Jenna, CSW to assist with his insurance application.   I brought up the importance of HCPOA/Living Will and he was happy to discuss as he has been speaking with his eldest daughter (she is in the [employer] and nursing school in [place] with her husband). She has expressed desire to be his surrogate decision maker and he is motivated to put this in writing. This is a sensitive subject as he is concerned about his significant other and does not really want her to know. Please do not mention this in room.   The subject also came up IF he is not a candidate for LVAD or surgery and I was clear that this is an entire different conversation. He understood exactly what I meant and immediately said "I'm not ready for that, I want to live!" I explained that this is the goal in him being here to see what options he have to help him to live. We decided that we will talk further about this in the future if he is found to not have good options to prolong his life. If he becomes a LVAD candidate we will speak more regarding his wishes if he were to decline or have further complications post-op. Either way I will follow up and continue conversation and support.   30 min  Alicia Parker, NP Palliative Medicine Team Pager # [phone] (M-F 8a-5p) Team Phone # [phone] (Nights/Weekends) 

## 2018-01-06 NOTE — Progress Notes (Signed)
Patient states he has a headache , hurts  When he coughs Blood pressure 107/86, pulse (!) 111, temperature 99.8 F (37.7 C), temperature source Oral, resp. rate 12, height 5\' 7"  (1.702 m), weight 66.7 kg, SpO2 99 %.

## 2018-01-06 NOTE — Progress Notes (Signed)
   Fever 103. Worsening cough. Call from [facility]  -ordered CXR, BCXx2, UA. -with line in place (milrinone) will order Cefepime (pharm consult)  Donato Schultz, MD

## 2018-01-06 NOTE — Plan of Care (Signed)
°  Problem: Education: °Goal: Knowledge of General Education information will improve °Description: Including pain rating scale, medication(s)/side effects and non-pharmacologic comfort measures °Outcome: Progressing °  °Problem: Health Behavior/Discharge Planning: °Goal: Ability to manage health-related needs will improve °Outcome: Progressing °  °Problem: Nutrition: °Goal: Adequate nutrition will be maintained °Outcome: Progressing °  °Problem: Coping: °Goal: Level of anxiety will decrease °Outcome: Progressing °  °Problem: Elimination: °Goal: Will not experience complications related to bowel motility °Outcome: Progressing °Goal: Will not experience complications related to urinary retention °Outcome: Progressing °  °Problem: Pain Managment: °Goal: General experience of comfort will improve °Outcome: Progressing °  °Problem: Safety: °Goal: Ability to remain free from injury will improve °Outcome: Progressing °  °Problem: Skin Integrity: °Goal: Risk for impaired skin integrity will decrease °Outcome: Progressing °  °

## 2018-01-06 NOTE — Progress Notes (Signed)
Pharmacy Antibiotic Note  Carl Ferguson is a 52 y.o. male admitted on 12/30/2017 with fever.  Pharmacy has been consulted for cefepime dosing.  Plan: Cefepime 2gm IV x 1 then 1gm q8 hours F/u renal function, cultures and clinical course  Height: 5\' 7"  (170.2 cm) Weight: 147 lb 0.8 oz (66.7 kg) IBW/kg (Calculated) : 66.1  Temp (24hrs), Avg:99.3 F (37.4 C), Min:97.7 F (36.5 C), Max:103.1 F (39.5 C)  Recent Labs  Lab 12/30/17 0858  01/02/18 0330 01/03/18 0517 01/04/18 0449 01/05/18 0346 01/06/18 0452  WBC 9.8  --   --  9.6  --   --  7.2  CREATININE 1.03   < > 1.04 0.84 0.98 1.08 1.27*   < > = values in this interval not displayed.    Estimated Creatinine Clearance: 65.1 mL/min (A) (by C-G formula based on SCr of 1.27 mg/dL (H)).    Allergies  Allergen Reactions  . Norflex [Orphenadrine] Swelling  . Orphenadrine Citrate Swelling    Face swelling      Thank you for allowing pharmacy to be a part of this patient's care.  Talbert Cage Poteet 01/06/2018 7:15 AM

## 2018-01-06 NOTE — Progress Notes (Signed)
Patients c/o feeling cold, headache heart rate started increasing and sustained 130-140s, EKG done , T99.3. DR Charlette Caffey paged  and was informed ,orders given for tylenol and continued care of  patient. Stable in bed on assessment  Kept npo except for meds for the night.

## 2018-01-06 NOTE — Progress Notes (Signed)
Amiodarone Drug - Drug Interaction Consult Note  Recommendations: -Will order serum digoxin level with am labs   Amiodarone is metabolized by the cytochrome P450 system and therefore has the potential to cause many drug interactions. Amiodarone has an average plasma half-life of 50 days (range 20 to 100 days).   There is potential for drug interactions to occur several weeks or months after stopping treatment and the onset of drug interactions may be slow after initiating amiodarone.   []  Statins: Increased risk of myopathy. Simvastatin- restrict dose to 20mg  daily. Other statins: counsel patients to report any muscle pain or weakness immediately.  []  Anticoagulants: Amiodarone can increase anticoagulant effect. Consider warfarin dose reduction. Patients should be monitored closely and the dose of anticoagulant altered accordingly, remembering that amiodarone levels take several weeks to stabilize.  []  Antiepileptics: Amiodarone can increase plasma concentration of phenytoin, the dose should be reduced. Note that small changes in phenytoin dose can result in large changes in levels. Monitor patient and counsel on signs of toxicity.  []  Beta blockers: increased risk of bradycardia, AV block and myocardial depression. Sotalol - avoid concomitant use.  []   Calcium channel blockers (diltiazem and verapamil): increased risk of bradycardia, AV block and myocardial depression.  []   Cyclosporine: Amiodarone increases levels of cyclosporine. Reduced dose of cyclosporine is recommended.  [x]  Digoxin dose should be halved when amiodarone is started.  []  Diuretics: increased risk of cardiotoxicity if hypokalemia occurs.  []  Oral hypoglycemic agents (glyburide, glipizide, glimepiride): increased risk of hypoglycemia. Patient's glucose levels should be monitored closely when initiating amiodarone therapy.   []  Drugs that prolong the QT interval:  Torsades de pointes risk may be increased with  concurrent use - avoid if possible.  Monitor QTc, also keep magnesium/potassium WNL if concurrent therapy can't be avoided. Marland Kitchen Antibiotics: e.g. fluoroquinolones, erythromycin. . Antiarrhythmics: e.g. quinidine, procainamide, disopyramide, sotalol. . Antipsychotics: e.g. phenothiazines, haloperidol.  . Lithium, tricyclic antidepressants, and methadone. Thank Clyda Greener  01/06/2018 12:49 PM

## 2018-01-06 NOTE — Progress Notes (Addendum)
Patient ID: Carl Ferguson, male   DOB: [birth year], 52 y.o.   MRN: [id number]     Advanced Heart Failure Rounding Note  PCP-Cardiologist: Garwin Brothers, MD   Subjective:    - Started on milrinone 0.25 mcg/kg/min 9/16  for low output on cath.  - Underwent ICD implant 9/19. Unable to place LV lead, had His lead placed but this has not significantly narrowed his QRS. A-sensed, V-paced on tele. - Milrinone decreased to 0.125 on 9/20, stopped 9/22 but did not tolerate with increased dyspnea/symptoms and fall in co-ox to 50%.  - Milrinone increased to 0.25 mcg on 9/22 - Febrile to 103 am 9/22  Earlier this morning, he developed fever to 103. Blood culture x 2, UA. Productive cough. Remains on milrinone 0.25 mcg/kg/min. He was tachycardic this morning to 140s-150s, ?flutter.   Feels awful, has headache and cough. Denies SOB. No chest pain.   RHC 12/30/17: RA mean 3 RV 35/6 PA 41/20, mean 30 PCWP mean 16 Oxygen saturations: PA 56% AO 97% Cardiac Output (Fick) 2.5  Cardiac Index (Fick) 1.4 PVR 5.6 WU  Objective:   Weight Range: 66.7 kg Body mass index is 23.03 kg/m.   Vital Signs:   Temp:  [97.7 F (36.5 C)-103.1 F (39.5 C)] 100.5 F (38.1 C) (09/23 0751) Pulse Rate:  [98-156] 142 (09/23 0751) Resp:  [12-24] 13 (09/23 0751) BP: (78-114)/(59-94) 90/74 (09/23 0751) SpO2:  [95 %-100 %] 95 % (09/23 0751) Weight:  [66.7 kg] 66.7 kg (09/23 0353) Last BM Date: 01/04/18  Weight change: Filed Weights   01/04/18 0426 01/05/18 0300 01/06/18 0353  Weight: 64.8 kg 65.3 kg 66.7 kg    Intake/Output:   Intake/Output Summary (Last 24 hours) at 01/06/2018 0757 Last data filed at 01/06/2018 0000 Gross per 24 hour  Intake 726.71 ml  Output 550 ml  Net 176.71 ml      Physical Exam  CVP 2-3  General:  In the chair.  No resp difficulty HEENT: normal Neck: supple. no JVD. Carotids 2+ bilat; no bruits. No lymphadenopathy or thryomegaly appreciated. Cor: PMI nondisplaced. Tachy  Regular rate & rhythm. +S3, 2/6 HSM apex. R upper chest dressing.  Lungs: clear on 2 liters.  Abdomen: soft, nontender, nondistended. No hepatosplenomegaly. No bruits or masses. Good bowel sounds. Extremities: no cyanosis, clubbing, rash, edema. RUE PICC Neuro: alert & orientedx3, cranial nerves grossly intact. moves all 4 extremities w/o difficulty. Affect pleasant   Telemetry   Sinus Tach versus A flutter 140s (personally reviewed)  Labs    CBC Recent Labs    01/06/18 0452  WBC 7.2  HGB 16.1  HCT 45.3  MCV 94.2  PLT PENDING   Basic Metabolic Panel Recent Labs    30/05/11 0346 01/06/18 0452  NA 131* 129*  K 4.7 5.3*  CL 93* 93*  CO2 27 26  GLUCOSE 93 127*  BUN 17 19  CREATININE 1.08 1.27*  CALCIUM 9.6 9.2   Liver Function Tests No results for input(s): AST, ALT, ALKPHOS, BILITOT, PROT, ALBUMIN in the last 72 hours. No results for input(s): LIPASE, AMYLASE in the last 72 hours. Cardiac Enzymes No results for input(s): CKTOTAL, CKMB, CKMBINDEX, TROPONINI in the last 72 hours.  BNP: BNP (last 3 results) Recent Labs    06/13/17 2143 07/02/17 1531 12/30/17 0858  BNP 1,699.9* 532.9* 4,087.0*    ProBNP (last 3 results) No results for input(s): PROBNP in the last 8760 hours.   D-Dimer No results for input(s): DDIMER in the  last 72 hours. Hemoglobin A1C Recent Labs    01/03/18 1939  HGBA1C 5.4   Fasting Lipid Panel Recent Labs    01/03/18 1939  CHOL 149  HDL 42  LDLCALC 98  TRIG 43  CHOLHDL 3.5   Thyroid Function Tests No results for input(s): TSH, T4TOTAL, T3FREE, THYROIDAB in the last 72 hours.  Invalid input(s): FREET3  Other results:   Imaging    No results found.   Medications:     Scheduled Medications: . aspirin EC  81 mg Oral Daily  . digoxin  0.125 mg Oral Daily  . enoxaparin (LOVENOX) injection  40 mg Subcutaneous Q24H  . feeding supplement (ENSURE ENLIVE)  237 mL Oral Q24H  . furosemide  40 mg Oral QPM  . furosemide   80 mg Oral Q breakfast  . nicotine  21 mg Transdermal Daily  . pantoprazole  40 mg Oral Daily  . sacubitril-valsartan  1 tablet Oral BID  . sodium chloride flush  10-40 mL Intracatheter Q12H  . sodium chloride flush  3 mL Intravenous Q12H  . spironolactone  25 mg Oral Daily    Infusions: . sodium chloride    . sodium chloride 20 mL/hr at 01/05/18 2139  . ceFEPime (MAXIPIME) IV    . ceFEPime (MAXIPIME) IV    . milrinone 0.25 mcg/kg/min (01/06/18 1610)    PRN Medications: sodium chloride, acetaminophen, albuterol, hydrocortisone cream, ondansetron (ZOFRAN) IV, oxyCODONE-acetaminophen, sodium chloride flush, sodium chloride flush    Patient Profile   Carl Ferguson is a 52 y.o. male with systolic CHF, NICM, Chronic LBBB, Tobacco abuse, prior ETOH abuse, prior substance abuse, and h/o of device placements with extractions due to infection.  Admitted after schedule RHC yesterday with low output and started on milrinone.   Assessment/Plan   1. Chronic Systolic CHF: Nonischemic cardiomyopathy, diagnosed in 2011, initially followed at [place].  LHC (3/19) with no significant CAD.  CPX (3/19) submaximal, but suggestive of mild to moderate HF limitation. HIV negative 3/19, history of drug/ETOH abuse in the past, no drugs now and has cut back ETOH considerably.  Echo this admission showed EF 20% with severe LV dilation, severe MR.  NYHA class IIIb symptoms with volume overload at admission.  He has a wide LBBB.  2 subcutaneous ICD systems were removed for infection at [facility]. He now has an ICD with His bundle lead.  Unable to place LV lead on 9/19.  He has been His pacing but this has not narrowed his QRS and is not likely to help his LV function or MR.   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  This is an issue as we are still working on his insurance coverage (Medicaid pending).  Today's CO-OX back on milrinone is 63%.  CVP 2-3 today.  Unfortunately, now with high fever and tachycardia.   - Hold spiro, entresto, and lasix.  - Now off beta blocker with low output.  - Continue digoxin 0.125 daily. Dig level <0.2 9/16. 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 Underwent ICD implant 9/19. Unable to place LV lead for CRT, has his bundle lead that is pacing but has not narrowed QRS. May need to consider Mitraclip (would need TEE). - He will be an LVAD candidate.  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  In this situation, not sure that Mitraclip  will be enough to get him off inotrope.  He will need to start LVAD workup.  - Cardiac rehab following. 2. Prior ETOH Abuse: Knows he needs complete cessation. No change 3. Tobacco abuse:  Knows he needs complete cessation. No change. 4. Mitral regurgitation: Severe MR on echo 12/26/17 MR appears central, and occurs in the setting of severe LV dilation as well as septal-lateral dyssynchrony with wide LBBB.  - CRT may decrease MR but unable to place LV lead for CRT. Now has ICD 9/19. - Consider Mitraclip versus straight to LVAD.   - had planned on TEE this morning but will hold off given fever/tachycardia/instability.  5. Social: Have referred to social worker to help him get Medicaid. He has been working with DSS in [place]. Has also applied for disability and plans to call and check on this. Per CM, he would be a self pay if he needs home milrinone. Antibiotics can be paid through charity fund if he needs at [place]. 6.  ID: Fever 103 this morning. UA sent. Blood CX x 2 obtained. Started on cefepime and adding vancomycin.  Possible source- new ICD or PICC.  He has had 2 ICD extractions in the past due to infection and had ICD placed this admit.  - Ask EP to look at site. - With multiple ICD infections in past, think it would be a good idea to get formal ID consult, especially  given consideration for LVAD.  - Will need to remove PICC line it appears.  - Vancomycin/cefepime as above.  7. Hyperkalemia: Holding entresto and spiro 8. Hyponatremia: Sodium 129.  Restrict free water.   Amy Clegg NP-C  01/06/2018 7:57 AM   Patient seen with NP, agree with the above note.    Over the weekend, we tried to wean his milrinone.  This was unsuccessful with fall in co-ox and increase in symptoms.  Last week, CRT was unsuccessful, got His bundle lead but this did not narrow his QRS.    Early this am, he developed fever to 103 with tachycardia (?sinus tachy versus atrial flutter).    He has a headache and cough today, no dyspnea.   On exam, he appears comfortable.  No JVD.  Heart tachy with S3, 2/6 HSM apex.  No edema.  Clear lungs.   BP lower this morning, K mildly elevated, creatinine mildly higher.  CVP 2-3, co-ox back to 63% on milrinone 0.25. Suspect developing sepsis on top of his baseline low output HF.  - Hold Entresto, Lasix, and spironolactone.  Will give a small fluid bolus.  - Continue milrinone 0.25.  Can add norepinephrine if needed, will follow closely.   Fever to 103, cough.  Possible respiratory infection but concerned for ICD site infection.  He had a long procedure last week with ICD implantation and LV lead attempt.  I am concerned ICD site is infected.  He has had 2 prior device infections (were at different sites).   - CXR, blood/urine cultures.  - Vancomycin/cefepime.  - Will involve ID given recurrent suspected device infections.  - Will likely need his PICC out. Can continue milrinone peripherally.   Hard to tell if sinus tachy or flutter by ECG.  1:1 conduction when device is interrogated.  - Will have him on amiodarone infusion for now.   He is milrinone dependent as above.  Will not be able to have cardiac resynchronization (could not place LV lead).  He has severe MR, we had planned to do TEE  today to see if Mitraclip would be option, but  concerned that he would not get enough benefit from Mitraclip to avoid LVAD.  We have started LVAD workup.  Potential recurrent device infection is a big concern here.  As above, will ask for ID help.   CRITICAL CARE Performed by: Marca Ancona  Total critical care time: 40 minutes  Critical care time was exclusive of separately billable procedures and treating other patients.  Critical care was necessary to treat or prevent imminent or life-threatening deterioration.  Critical care was time spent personally by me on the following activities: development of treatment plan with patient and/or surrogate as well as nursing, discussions with consultants, evaluation of patient's response to treatment, examination of patient, obtaining history from patient or surrogate, ordering and performing treatments and interventions, ordering and review of laboratory studies, ordering and review of radiographic studies, pulse oximetry and re-evaluation of patient's condition.  Marca Ancona 01/06/2018 9:25 AM

## 2018-01-06 NOTE — Consult Note (Signed)
[facility]    Date of Admission:  12/30/2017     Total days of antibiotics                Reason for Consult:    Referring Provider:  Primary Care Provider: Patient, No Pcp Per   Assessment/Plan:  [name] is a 52 year old male with previous history of CHF and requiring ICD replacements in November 2015 and September 2016 (MSSA) secondary to infection who was admitted for initiation of milrinone. He is POD #4 from insertion of a BIV ICD located in his right chest and had been doing well up to this morning when he developed a 103.1 degree temperature, cough and tachycardia. Surgical site appears without evidence of infection. He has no evidence of pneumonia through history, exam, and imaging.  PICC line was removed. His fevers have been down trending and he is feeling better with no further cough. Currently on vancomycin and cefepime for broad spectrum coverage with blood cultures pending. Concern does remain for potential infection of the ICD, although at this time it appears more likely the PICC line.  1. Continue vancomycin and cefepime. Will consider holding antibiotics in 24-48 hours pending additional fevers.  2. Monitor blood cultures. 3. Watch temp curve, with PIC  Active Problems:   Acute systolic CHF (congestive heart failure) (HCC)   Goals of care, counseling/discussion   Palliative care encounter   . aspirin EC  81 mg Oral Daily  . digoxin  0.125 mg Oral Daily  . enoxaparin (LOVENOX) injection  40 mg Subcutaneous Q24H  . feeding supplement (ENSURE ENLIVE)  237 mL Oral Q24H  . nicotine  21 mg Transdermal Daily  . pantoprazole  40 mg Oral Daily  . sodium chloride flush  10-40 mL Intracatheter Q12H  . sodium chloride flush  3 mL Intravenous Q12H     HPI: Carl Ferguson is a 52 y.o. male with a previous medical history of CHF, NICM, tobacco use, prior substance abuse and device replacements secondary to infection who was admitted  following a scheduled catheterization which showed low output heart failure to start milrinone. Underwent BIV ICD insertion on 01/02/18. He was progressing well until this morning when he developed a fever of 103.1 and noted to be tachycardic. Also experiencing a cough with chest x-ray with no pneumothorax and no evidence of pneumonia.  Blood cultures were ordered and are pending and started on vancomycin and cefepime for broad spectrum coverage. Chart review shows device removals in 11/15 and 9/16 (MSSA).  Since last evening he has been feeling better with no additional cough. Cough was primarily present when this milrinone was dropped and improved when the medication was increased again. Afebrile since last evening and feeling okay.    Review of Systems: Review of Systems  Constitutional: Positive for chills and fever. Negative for malaise/fatigue.  Respiratory: Negative for cough, sputum production, shortness of breath and wheezing.   Cardiovascular: Negative for chest pain and leg swelling.  Gastrointestinal: Negative for abdominal pain, constipation, diarrhea, nausea and vomiting.  Genitourinary: Negative for dysuria, flank pain, frequency, hematuria and urgency.  Skin: Negative for rash.     Past Medical History:  Diagnosis Date  . CHF (congestive heart failure) (HCC)   . COPD (chronic obstructive pulmonary disease) (HCC)   . Dyspnea   . Nonischemic cardiomyopathy (HCC)    a. diagnosed in 2011 with EF 30% at that time b. EF improved to 50% by  echo in 02/2016    Social History   Tobacco Use  . Smoking status: Current Some Day Smoker    Types: Cigarettes  . Smokeless tobacco: Never Used  Substance Use Topics  . Alcohol use: Yes    Frequency: Never    Comment: 3-4 beers daily  . Drug use: No    Family History  Problem Relation Age of Onset  . Heart attack Mother   . Sudden Cardiac Death Neg Hx     Allergies  Allergen Reactions  . Norflex [Orphenadrine] Swelling  .  Orphenadrine Citrate Swelling    Face swelling     OBJECTIVE: Blood pressure 93/76, pulse 100, temperature 97.9 F (36.6 C), temperature source Oral, resp. rate 17, height 5\' 7"  (1.702 m), weight 66.7 kg, SpO2 94 %.  Physical Exam  Constitutional: He is oriented to person, place, and time. He appears well-developed and well-nourished. No distress.  Cardiovascular: Normal rate, regular rhythm, normal heart sounds and intact distal pulses.  Pulmonary/Chest: Effort normal and breath sounds normal. No stridor. No respiratory distress. He has no wheezes. He has no rales. He exhibits no tenderness.  Abdominal: Soft. Bowel sounds are normal. He exhibits no distension. There is no tenderness.  Neurological: He is alert and oriented to person, place, and time.  Skin: Skin is warm and dry.  Psychiatric: He has a normal mood and affect. His behavior is normal. Judgment and thought content normal.  PIC site is clean, no d/c.  No CCE in feet/LE S3 His pacer site has mild edema, no gross fluctuance.   Lab Results Lab Results  Component Value Date   WBC 7.2 01/06/2018   HGB 16.1 01/06/2018   HCT 45.3 01/06/2018   MCV 94.2 01/06/2018   PLT PLATELET CLUMPS NOTED ON SMEAR, UNABLE TO ESTIMATE 01/06/2018    Lab Results  Component Value Date   CREATININE 1.27 (H) 01/06/2018   BUN 19 01/06/2018   NA 129 (L) 01/06/2018   K 5.3 (H) 01/06/2018   CL 93 (L) 01/06/2018   CO2 26 01/06/2018    Lab Results  Component Value Date   ALT 12 12/30/2017   AST 17 12/30/2017   ALKPHOS 97 12/30/2017   BILITOT 0.9 12/30/2017     Microbiology: Recent Results (from the past 240 hour(s))  MRSA PCR Screening     Status: None   Collection Time: 12/30/17  6:43 PM  Result Value Ref Range Status   MRSA by PCR NEGATIVE NEGATIVE Final    Comment:        The GeneXpert MRSA Assay (FDA approved for NASAL specimens only), is one component of a comprehensive MRSA colonization surveillance program. It is  not intended to diagnose MRSA infection nor to guide or monitor treatment for MRSA infections. Performed at [facility], [address] [place], [place] [address]   Surgical PCR screen     Status: None   Collection Time: 01/01/18 11:44 PM  Result Value Ref Range Status   MRSA, PCR NEGATIVE NEGATIVE Final   Staphylococcus aureus NEGATIVE NEGATIVE Final    Comment: (NOTE) The Xpert SA Assay (FDA approved for NASAL specimens in patients 41 years of age and older), is one component of a comprehensive surveillance program. It is not intended to diagnose infection nor to guide or monitor treatment. Performed at [facility], [address] [place], [place] [address]      Marcos Eke, NP [facility]  [phone] Pager  01/06/2018  12:42 PM Patient seen and examined, agree with above note.  I dictated the care and orders written for this patient under my direction. I was immediately available on site, by phone/page to assist in the care of this patient. Will watch and see how he does with PIC out.  Despite fever last PM, he looks clinically well today. His PIC and pacer sites appear clean.

## 2018-01-07 ENCOUNTER — Encounter (HOSPITAL_COMMUNITY): Payer: Self-pay | Admitting: Dentistry

## 2018-01-07 DIAGNOSIS — R509 Fever, unspecified: Secondary | ICD-10-CM

## 2018-01-07 DIAGNOSIS — I509 Heart failure, unspecified: Secondary | ICD-10-CM

## 2018-01-07 DIAGNOSIS — I255 Ischemic cardiomyopathy: Secondary | ICD-10-CM

## 2018-01-07 DIAGNOSIS — R6883 Chills (without fever): Secondary | ICD-10-CM

## 2018-01-07 DIAGNOSIS — R008 Other abnormalities of heart beat: Secondary | ICD-10-CM

## 2018-01-07 LAB — BASIC METABOLIC PANEL
ANION GAP: 11 (ref 5–15)
BUN: 18 mg/dL (ref 6–20)
CO2: 23 mmol/L (ref 22–32)
Calcium: 8.9 mg/dL (ref 8.9–10.3)
Chloride: 98 mmol/L (ref 98–111)
Creatinine, Ser: 1.11 mg/dL (ref 0.61–1.24)
GFR calc Af Amer: >60 mL/min (ref >60)
GFR calc non Af Amer: >60 mL/min (ref >60)
GLUCOSE: 135 mg/dL — AB (ref 70–99)
POTASSIUM: 3.7 mmol/L (ref 3.5–5.1)
Sodium: 132 mmol/L — ABNORMAL LOW (ref 135–145)

## 2018-01-07 LAB — CBC WITH DIFFERENTIAL/PLATELET
ABS IMMATURE GRANULOCYTES: 0 10*3/uL (ref 0.0–0.1)
BASOS ABS: 0 10*3/uL (ref 0.0–0.1)
Basophils Relative: 1 %
Eosinophils Absolute: 0.1 10*3/uL (ref 0.0–0.7)
Eosinophils Relative: 2 %
HCT: 40.3 % (ref 39.0–52.0)
HEMOGLOBIN: 14.2 g/dL (ref 13.0–17.0)
Immature Granulocytes: 0 %
LYMPHS PCT: 24 %
Lymphs Abs: 1.3 10*3/uL (ref 0.7–4.0)
MCH: 33.2 pg (ref 26.0–34.0)
MCHC: 35.2 g/dL (ref 30.0–36.0)
MCV: 94.2 fL (ref 78.0–100.0)
MONO ABS: 0.9 10*3/uL (ref 0.1–1.0)
MONOS PCT: 17 %
NEUTROS ABS: 3.1 10*3/uL (ref 1.7–7.7)
Neutrophils Relative %: 56 %
Platelets: 98 10*3/uL — ABNORMAL LOW (ref 150–400)
RBC: 4.28 MIL/uL (ref 4.22–5.81)
RDW: 12.8 % (ref 11.5–15.5)
WBC: 5.4 10*3/uL (ref 4.0–10.5)

## 2018-01-07 LAB — DIGOXIN LEVEL: Digoxin Level: 0.3 ng/mL — ABNORMAL LOW (ref 0.8–2.0)

## 2018-01-07 MED ORDER — FUROSEMIDE 40 MG PO TABS
40.0000 mg | ORAL_TABLET | Freq: Every evening | ORAL | Status: DC
  Administered 2018-01-07 – 2018-01-09 (×3): 40 mg via ORAL
  Filled 2018-01-07 (×3): qty 1

## 2018-01-07 MED ORDER — SODIUM CHLORIDE 0.9 % IV SOLN: INTRAVENOUS | Status: DC

## 2018-01-07 MED ORDER — POTASSIUM CHLORIDE CRYS ER 20 MEQ PO TBCR
40.0000 meq | EXTENDED_RELEASE_TABLET | Freq: Once | ORAL | Status: AC
  Administered 2018-01-07: 40 meq via ORAL
  Filled 2018-01-07: qty 2

## 2018-01-07 MED ORDER — FUROSEMIDE 80 MG PO TABS
80.0000 mg | ORAL_TABLET | Freq: Every day | ORAL | Status: DC
  Administered 2018-01-07 – 2018-01-09 (×3): 80 mg via ORAL
  Filled 2018-01-07 (×3): qty 1

## 2018-01-07 NOTE — Progress Notes (Signed)
[id number] Offered to walk with pt. He stated he wanted to go outside. Told him I had to check with RN and he said forget about it. Pt stated he is feeling much better and can walk himself. Stated he will walk later. Has IS at bedside. Encouraged IS and walks for pulmonary function.  Luetta Nutting RN BSN 01/07/2018 2:07 PM

## 2018-01-07 NOTE — Progress Notes (Addendum)
Patient ID: Carl Ferguson, male   DOB: [birth year], 52 y.o.   MRN: [id number]     Advanced Heart Failure Rounding Note  PCP-Cardiologist: Garwin Brothers, MD   Subjective:    - Started on milrinone 0.25 mcg/kg/min 9/16  for low output on cath.  - Underwent ICD implant 9/19. Unable to place LV lead, had His lead placed but this has not significantly narrowed his QRS. A-sensed, V-paced on tele. - Milrinone decreased to 0.125 on 9/20, stopped 9/22 but did not tolerate with increased dyspnea/symptoms and fall in co-ox to 50%.  - Milrinone increased to 0.25 mcg on 9/22 - Febrile to 103 am 9/22. PICC out. ID consulted. Started on vanc and cefepime. Also Aflutter. Started on amio drip.   Remains on milrinone 0.25 mcg + amio drip 30 mg per hour.  Afebrile.   Feeling much better. Denies SOB.  RHC 12/30/17: RA mean 3 RV 35/6 PA 41/20, mean 30 PCWP mean 16 Oxygen saturations: PA 56% AO 97% Cardiac Output (Fick) 2.5  Cardiac Index (Fick) 1.4 PVR 5.6 WU  Objective:   Weight Range: 66.4 kg Body mass index is 22.93 kg/m.   Vital Signs:   Temp:  [97.9 F (36.6 C)-100.5 F (38.1 C)] 98.1 F (36.7 C) (09/24 0339) Pulse Rate:  [85-142] 90 (09/24 0339) Resp:  [13-27] 27 (09/24 0339) BP: (81-102)/(63-78) 90/69 (09/24 0339) SpO2:  [92 %-100 %] 95 % (09/24 0339) Weight:  [66.4 kg] 66.4 kg (09/24 0500) Last BM Date: 01/06/18  Weight change: Filed Weights   01/05/18 0300 01/06/18 0353 01/07/18 0500  Weight: 65.3 kg 66.7 kg 66.4 kg    Intake/Output:   Intake/Output Summary (Last 24 hours) at 01/07/2018 0735 Last data filed at 01/07/2018 0342 Gross per 24 hour  Intake 2287.92 ml  Output 800 ml  Net 1487.92 ml      Physical Exam  General:  No resp difficulty. In bed.  HEENT: normal Neck: supple. JVP 8-9. Carotids 2+ bilat; no bruits. No lymphadenopathy or thryomegaly appreciated. Cor: PMI nondisplaced. Regular rate & rhythm. No rubs, r murmurs.+ S3 R upper chest steri strips.    Lungs: clear Abdomen: soft, nontender, nondistended. No hepatosplenomegaly. No bruits or masses. Good bowel sounds. Extremities: no cyanosis, clubbing, rash, edema Neuro: alert & orientedx3, cranial nerves grossly intact. moves all 4 extremities w/o difficulty. Affect pleasant    Telemetry   NSR 90s personally reviewed.   Labs    CBC Recent Labs    01/06/18 0452 01/07/18 0313  WBC 7.2 5.4  NEUTROABS  --  3.1  HGB 16.1 14.2  HCT 45.3 40.3  MCV 94.2 94.2  PLT PLATELET CLUMPS NOTED ON SMEAR, UNABLE TO ESTIMATE 98*   Basic Metabolic Panel Recent Labs    78/29/56 0452 01/07/18 0313  NA 129* 132*  K 5.3* 3.7  CL 93* 98  CO2 26 23  GLUCOSE 127* 135*  BUN 19 18  CREATININE 1.27* 1.11  CALCIUM 9.2 8.9   Liver Function Tests No results for input(s): AST, ALT, ALKPHOS, BILITOT, PROT, ALBUMIN in the last 72 hours. No results for input(s): LIPASE, AMYLASE in the last 72 hours. Cardiac Enzymes No results for input(s): CKTOTAL, CKMB, CKMBINDEX, TROPONINI in the last 72 hours.  BNP: BNP (last 3 results) Recent Labs    06/13/17 2143 07/02/17 1531 12/30/17 0858  BNP 1,699.9* 532.9* 4,087.0*    ProBNP (last 3 results) No results for input(s): PROBNP in the last 8760 hours.   D-Dimer No results  for input(s): DDIMER in the last 72 hours. Hemoglobin A1C No results for input(s): HGBA1C in the last 72 hours. Fasting Lipid Panel No results for input(s): CHOL, HDL, LDLCALC, TRIG, CHOLHDL, LDLDIRECT in the last 72 hours. Thyroid Function Tests No results for input(s): TSH, T4TOTAL, T3FREE, THYROIDAB in the last 72 hours.  Invalid input(s): FREET3  Other results:   Imaging    Dg Chest Port 1 View  Result Date: 01/06/2018 CLINICAL DATA:  Tachycardia EXAM: PORTABLE CHEST 1 VIEW COMPARISON:  January 04, 2018 FINDINGS: There is cardiomegaly. Pulmonary vascularity is normal. No adenopathy. Central catheter tip is in the superior vena cava. No pneumothorax. Pacemaker  leads are attached to the right atrium and right ventricle. No adenopathy. No bone lesions. IMPRESSION: Central catheter tip in superior vena cava. Pacemaker leads unchanged. No pneumothorax. Stable cardiomegaly. Lungs clear. Electronically Signed   By: Bretta Bang III M.D.   On: 01/06/2018 09:09     Medications:     Scheduled Medications: . aspirin EC  81 mg Oral Daily  . digoxin  0.125 mg Oral Daily  . enoxaparin (LOVENOX) injection  40 mg Subcutaneous Q24H  . feeding supplement (ENSURE ENLIVE)  237 mL Oral Q24H  . nicotine  21 mg Transdermal Daily  . pantoprazole  40 mg Oral Daily  . sodium chloride flush  10-40 mL Intracatheter Q12H  . sodium chloride flush  3 mL Intravenous Q12H    Infusions: . sodium chloride    . sodium chloride 20 mL/hr at 01/06/18 1900  . amiodarone 30 mg/hr (01/07/18 0729)  . ceFEPime (MAXIPIME) IV 1 g (01/07/18 0501)  . milrinone 0.25 mcg/kg/min (01/06/18 2314)  . vancomycin 750 mg (01/06/18 2220)    PRN Medications: sodium chloride, acetaminophen, albuterol, hydrocortisone cream, ondansetron (ZOFRAN) IV, oxyCODONE-acetaminophen, sodium chloride flush, sodium chloride flush    Patient Profile   Carl Ferguson is a 52 y.o. male with systolic CHF, NICM, Chronic LBBB, Tobacco abuse, prior ETOH abuse, prior substance abuse, and h/o of device placements with extractions due to infection.  Admitted after schedule RHC yesterday with low output and started on milrinone.   Assessment/Plan   1. Chronic Systolic CHF: Nonischemic cardiomyopathy, diagnosed in 2011, initially followed at [place].  LHC (3/19) with no significant CAD.  CPX (3/19) submaximal, but suggestive of mild to moderate HF limitation. HIV negative 3/19, history of drug/ETOH abuse in the past, no drugs now and has cut back ETOH considerably.  Echo this admission showed EF 20% with severe LV dilation, severe MR.  NYHA class IIIb symptoms with volume overload at admission.  He has a  wide LBBB.  2 subcutaneous ICD systems were removed for infection at [facility]. He now has an ICD with His bundle lead.  Unable to place LV lead on 9/19.  He has been His pacing but this has not narrowed his QRS and is not likely to help his LV function or MR.   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  This is an issue as we are still working on his insurance coverage (Medicaid pending).   - PICC out.     - Off spiro, entresto, and lasix with hypotension yesterday.  - Now off beta blocker with low output.  - Continue digoxin 0.125 daily. Dig level 0.3  - There is concern that LBBB with dyssynchrony plays a significant role in his severe central MR (likely primarily functional). Underwent ICD implant 9/19. Unable  to place LV lead for CRT, has his bundle lead that is pacing but has not narrowed QRS. May need to consider Mitraclip (would need TEE). - He will be an LVAD candidate.  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  In this situation, not sure that Mitraclip will be enough to get him off inotrope.  He will need to start LVAD workup.  - Cardiac rehab following. 2. Prior ETOH Abuse: Knows he needs complete cessation. No change 3. Tobacco abuse:  Knows he needs complete cessation. No change. 4. Mitral regurgitation: Severe MR on echo 12/26/17 MR appears central, and occurs in the setting of severe LV dilation as well as septal-lateral dyssynchrony with wide LBBB.  - CRT may decrease MR but unable to place LV lead for CRT. Now has ICD 9/19. - Consider Mitraclip versus straight to LVAD.   - TEE on hold. .  5. Social: Have referred to social worker to help him get Medicaid. He has been working with DSS in [place]. Has also applied for disability and plans to call and check on this. Per CM, he would be a self pay if he needs home milrinone. Antibiotics can be paid through charity fund if he needs at [place]. 6.  ID:  Afebrile. UA negative.  Blood CX x 2 with NGTD -  Started on cefepime and vancomycin.  Possible source- new ICD or PICC.  He has had 2 ICD extractions in the past due to infection. ICD placed last week.  - With multiple ICD infections in past, think it would be a good idea to get formal ID consult, especially given consideration for LVAD.  - PICC out.  - Vancomycin/cefepime as above.  7. Hyperkalemia: Resolved.  8. Hyponatremia: sodium up to 132. Continue to follow daily. . 9. Sinus Tachycardia:  Stop amio  Per Dr Ladona Ridgel looks like he was in Sinus Tach.  This has resolved, HR in 90s today.  10. Thrombocytopenia: Platelets down today to 98, ?related to infection.  He is on Lovenox and drop fairly precipitous, think HIT unlikely.  Follow closely.   Set up TEE for tomorrow.    Amy Clegg NP-C  01/07/2018 7:35 AM   Patient seen with NP, agree with the above note.    He appears much better today.  Afebrile, WBCs 5.  PICC line removed yesterday. Feels better, no dyspnea.    On exam, JVP 8-9 cm.  Regular S1S2 +S3.  No edema.   Patient is now milrinone dependent.  At this point, I think he needs LVAD.  Suspect LV is too large and hypokinetic to improve significantly with Mitraclip (discussed with Dr. Excell Seltzer).  Complicating plan for LVAD is fever/sepsis syndrome yesterday with recent ICD placement.  Hopefully, this was PICC line infection and PICC is now out.  He seems much better today.  - Resume Lasix 80 qam/40 qpm.  - BP still soft, will leave off Entresto and spironolactone for now.  - Continue milrinone 0.25.   - He will need LVAD.  Will discuss timing with team.  Workup ongoing, will need to see surgeon.   Afebrile, WBCs 5 today.  PICC out and fevers resolved.  ID following.  ICD site looks ok.  Hopefully, this was PICC infection and not related to ICD.  - Will plan TEE tomorrow to look at ICD leads and also to assess MR though suspect not going to be a good Mitraclip candidate.  - He is on  vancomycin/cefepime.  Will follow ID recommendations regarding antibiotics.    Suspect sinus tachy yesterday with fever, sepsis syndrome. Can stop amiodarone.   Platelets lower today, large drop and suspect not HIT.  Has been on Lovenox.  Will follow, may be related to acute infection.   Marca Ancona 01/07/2018 9:07 AM
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
## 2018-01-07 NOTE — Progress Notes (Signed)
[facility]  Date of Admission:  12/30/2017     Total days of antibiotics 2         ASSESSMENT/PLAN  Mr. Rommel is has been afebrile for the last 24 hours experiencing chills yesterday afternoon which have resolved. He is feeling good today. Blood cultures and gram stain remain in process. WBC count is normal. His ICD site is without evidence of infection. At this point there does not appear to be any evidence of infection present. Based on his chills yesterday afternoon it would likely be prudent to continue antibiotics for today until gram stain/cultures return.  1. Continue vancomycin and cefepime  2. Await blood culture results  3. Await TEE   Active Problems:   Acute systolic CHF (congestive heart failure) (HCC)   Goals of care, counseling/discussion   Palliative care encounter   . aspirin EC  81 mg Oral Daily  . digoxin  0.125 mg Oral Daily  . enoxaparin (LOVENOX) injection  40 mg Subcutaneous Q24H  . feeding supplement (ENSURE ENLIVE)  237 mL Oral Q24H  . nicotine  21 mg Transdermal Daily  . pantoprazole  40 mg Oral Daily  . sodium chloride flush  10-40 mL Intracatheter Q12H  . sodium chloride flush  3 mL Intravenous Q12H    SUBJECTIVE:  Afebrile overnight with normal WBC count of 5.4. Blood cultures remain in process. Lab work reviewed with no significant abnormalities. He had chills yesterday afternoon that have resolved and is feeling good today.   Allergies  Allergen Reactions  . Norflex [Orphenadrine] Swelling  . Orphenadrine Citrate Swelling    Face swelling      Review of Systems: Review of Systems  Constitutional: Positive for chills. Negative for fever and weight loss.  Respiratory: Negative for cough, shortness of breath and wheezing.   Cardiovascular: Negative for chest pain and leg swelling.  Gastrointestinal: Negative for abdominal pain, constipation, diarrhea, nausea and vomiting.  Genitourinary: Negative for  dysuria, frequency, hematuria and urgency.  Skin: Negative for rash.      OBJECTIVE: Vitals:   01/06/18 2317 01/07/18 0339 01/07/18 0500 01/07/18 0740  BP: 93/66 90/69  106/86  Pulse: 99 90  98  Resp: (!) 26 (!) 27  (!) 24  Temp: 98.1 F (36.7 C) 98.1 F (36.7 C)  97.6 F (36.4 C)  TempSrc: Oral Oral  Oral  SpO2: 100% 95%  97%  Weight:   66.4 kg   Height:       Body mass index is 22.93 kg/m.  Physical Exam  Constitutional: He is oriented to person, place, and time. He appears well-developed and well-nourished. No distress.  Lying in bed with head of bed elevated. Pleasant.   Cardiovascular: Normal rate, regular rhythm and intact distal pulses. Exam reveals gallop and S3.  Pulmonary/Chest: Breath sounds normal. No stridor. No respiratory distress. He has no wheezes. He has no rales. He exhibits no tenderness.  Abdominal: Soft. Bowel sounds are normal. He exhibits no distension and no mass. There is no tenderness. There is no rebound and no guarding.  Neurological: He is alert and oriented to person, place, and time.  Skin: Skin is warm and dry.  Psychiatric: He has a normal mood and affect.    Lab Results Lab Results  Component Value Date   WBC 5.4 01/07/2018   HGB 14.2 01/07/2018   HCT 40.3 01/07/2018   MCV 94.2 01/07/2018   PLT 98 (L) 01/07/2018    Lab Results  Component Value Date   CREATININE 1.11 01/07/2018   BUN 18 01/07/2018   NA 132 (L) 01/07/2018   K 3.7 01/07/2018   CL 98 01/07/2018   CO2 23 01/07/2018    Lab Results  Component Value Date   ALT 12 12/30/2017   AST 17 12/30/2017   ALKPHOS 97 12/30/2017   BILITOT 0.9 12/30/2017     Microbiology: Recent Results (from the past 240 hour(s))  MRSA PCR Screening     Status: None   Collection Time: 12/30/17  6:43 PM  Result Value Ref Range Status   MRSA by PCR NEGATIVE NEGATIVE Final    Comment:        The GeneXpert MRSA Assay (FDA approved for NASAL specimens only), is one component of  a comprehensive MRSA colonization surveillance program. It is not intended to diagnose MRSA infection nor to guide or monitor treatment for MRSA infections. Performed at [facility], [address] [place], [place] [address]   Surgical PCR screen     Status: None   Collection Time: 01/01/18 11:44 PM  Result Value Ref Range Status   MRSA, PCR NEGATIVE NEGATIVE Final   Staphylococcus aureus NEGATIVE NEGATIVE Final    Comment: (NOTE) The Xpert SA Assay (FDA approved for NASAL specimens in patients 26 years of age and older), is one component of a comprehensive surveillance program. It is not intended to diagnose infection nor to guide or monitor treatment. Performed at [facility], [address], [place], [place] [address]      Marcos Eke, NP [facility] [phone] Pager  01/07/2018  8:08 AM  Patient seen and examined, agree with above note.  I dictated the care and orders written for this patient under my direction. I was immediately available on site, by phone/page to assist in the care of this patient.

## 2018-01-07 NOTE — H&P (View-Only) (Signed)
Patient ID: Carl Ferguson, male   DOB: 01/01/1966, 52 y.o.   MRN: [id number]     Advanced Heart Failure Rounding Note  PCP-Cardiologist: Rajan R Revankar, MD   Subjective:    - Started on milrinone 0.25 mcg/kg/min 9/16  for low output on cath.  - Underwent ICD implant 9/19. Unable to place LV lead, had His lead placed but this has not significantly narrowed his QRS. A-sensed, V-paced on tele. - Milrinone decreased to 0.125 on 9/20, stopped 9/22 but did not tolerate with increased dyspnea/symptoms and fall in co-ox to 50%.  - Milrinone increased to 0.25 mcg on 9/22 - Febrile to 103 am 9/22. PICC out. ID consulted. Started on vanc and cefepime. Also Aflutter. Started on amio drip.   Remains on milrinone 0.25 mcg + amio drip 30 mg per hour.  Afebrile.   Feeling much better. Denies SOB.  RHC 12/30/17: RA mean 3 RV 35/6 PA 41/20, mean 30 PCWP mean 16 Oxygen saturations: PA 56% AO 97% Cardiac Output (Fick) 2.5  Cardiac Index (Fick) 1.4 PVR 5.6 WU  Objective:   Weight Range: 66.4 kg Body mass index is 22.93 kg/m.   Vital Signs:   Temp:  [97.9 F (36.6 C)-100.5 F (38.1 C)] 98.1 F (36.7 C) (09/24 0339) Pulse Rate:  [85-142] 90 (09/24 0339) Resp:  [13-27] 27 (09/24 0339) BP: (81-102)/(63-78) 90/69 (09/24 0339) SpO2:  [92 %-100 %] 95 % (09/24 0339) Weight:  [66.4 kg] 66.4 kg (09/24 0500) Last BM Date: 01/06/18  Weight change: Filed Weights   01/05/18 0300 01/06/18 0353 01/07/18 0500  Weight: 65.3 kg 66.7 kg 66.4 kg    Intake/Output:   Intake/Output Summary (Last 24 hours) at 01/07/2018 0735 Last data filed at 01/07/2018 0342 Gross per 24 hour  Intake 2287.92 ml  Output 800 ml  Net 1487.92 ml      Physical Exam  General:  No resp difficulty. In bed.  HEENT: normal Neck: supple. JVP 8-9. Carotids 2+ bilat; no bruits. No lymphadenopathy or thryomegaly appreciated. Cor: PMI nondisplaced. Regular rate & rhythm. No rubs, r murmurs.+ S3 R upper chest steri strips.    Lungs: clear Abdomen: soft, nontender, nondistended. No hepatosplenomegaly. No bruits or masses. Good bowel sounds. Extremities: no cyanosis, clubbing, rash, edema Neuro: alert & orientedx3, cranial nerves grossly intact. moves all 4 extremities w/o difficulty. Affect pleasant    Telemetry   NSR 90s personally reviewed.   Labs    CBC Recent Labs    01/06/18 0452 01/07/18 0313  WBC 7.2 5.4  NEUTROABS  --  3.1  HGB 16.1 14.2  HCT 45.3 40.3  MCV 94.2 94.2  PLT PLATELET CLUMPS NOTED ON SMEAR, UNABLE TO ESTIMATE 98*   Basic Metabolic Panel Recent Labs    01/06/18 0452 01/07/18 0313  NA 129* 132*  K 5.3* 3.7  CL 93* 98  CO2 26 23  GLUCOSE 127* 135*  BUN 19 18  CREATININE 1.27* 1.11  CALCIUM 9.2 8.9   Liver Function Tests No results for input(s): AST, ALT, ALKPHOS, BILITOT, PROT, ALBUMIN in the last 72 hours. No results for input(s): LIPASE, AMYLASE in the last 72 hours. Cardiac Enzymes No results for input(s): CKTOTAL, CKMB, CKMBINDEX, TROPONINI in the last 72 hours.  BNP: BNP (last 3 results) Recent Labs    06/13/17 2143 07/02/17 1531 12/30/17 0858  BNP 1,699.9* 532.9* 4,087.0*    ProBNP (last 3 results) No results for input(s): PROBNP in the last 8760 hours.   D-Dimer No results   for input(s): DDIMER in the last 72 hours. Hemoglobin A1C No results for input(s): HGBA1C in the last 72 hours. Fasting Lipid Panel No results for input(s): CHOL, HDL, LDLCALC, TRIG, CHOLHDL, LDLDIRECT in the last 72 hours. Thyroid Function Tests No results for input(s): TSH, T4TOTAL, T3FREE, THYROIDAB in the last 72 hours.  Invalid input(s): FREET3  Other results:   Imaging    Dg Chest Port 1 View  Result Date: 01/06/2018 CLINICAL DATA:  Tachycardia EXAM: PORTABLE CHEST 1 VIEW COMPARISON:  January 04, 2018 FINDINGS: There is cardiomegaly. Pulmonary vascularity is normal. No adenopathy. Central catheter tip is in the superior vena cava. No pneumothorax. Pacemaker  leads are attached to the right atrium and right ventricle. No adenopathy. No bone lesions. IMPRESSION: Central catheter tip in superior vena cava. Pacemaker leads unchanged. No pneumothorax. Stable cardiomegaly. Lungs clear. Electronically Signed   By: William  Woodruff III M.D.   On: 01/06/2018 09:09     Medications:     Scheduled Medications: . aspirin EC  81 mg Oral Daily  . digoxin  0.125 mg Oral Daily  . enoxaparin (LOVENOX) injection  40 mg Subcutaneous Q24H  . feeding supplement (ENSURE ENLIVE)  237 mL Oral Q24H  . nicotine  21 mg Transdermal Daily  . pantoprazole  40 mg Oral Daily  . sodium chloride flush  10-40 mL Intracatheter Q12H  . sodium chloride flush  3 mL Intravenous Q12H    Infusions: . sodium chloride    . sodium chloride 20 mL/hr at 01/06/18 1900  . amiodarone 30 mg/hr (01/07/18 0729)  . ceFEPime (MAXIPIME) IV 1 g (01/07/18 0501)  . milrinone 0.25 mcg/kg/min (01/06/18 2314)  . vancomycin 750 mg (01/06/18 2220)    PRN Medications: sodium chloride, acetaminophen, albuterol, hydrocortisone cream, ondansetron (ZOFRAN) IV, oxyCODONE-acetaminophen, sodium chloride flush, sodium chloride flush    Patient Profile   [name] is a 52 y.o. male with systolic CHF, NICM, Chronic LBBB, Tobacco abuse, prior ETOH abuse, prior substance abuse, and h/o of device placements with extractions due to infection.  Admitted after schedule RHC yesterday with low output and started on milrinone.   Assessment/Plan   1. Chronic Systolic CHF: Nonischemic cardiomyopathy, diagnosed in 2011, initially followed at [facility].  LHC (3/19) with no significant CAD.  CPX (3/19) submaximal, but suggestive of mild to moderate HF limitation. HIV negative 3/19, history of drug/ETOH abuse in the past, no drugs now and has cut back ETOH considerably.  Echo this admission showed EF 20% with severe LV dilation, severe MR.  NYHA class IIIb symptoms with volume overload at admission.  He has a  wide LBBB.  2 subcutaneous ICD systems were removed for infection at [facility]. He now has an ICD with His bundle lead.  Unable to place LV lead on 9/19.  He has been His pacing but this has not narrowed his QRS and is not likely to help his LV function or MR.   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  This is an issue as we are still working on his insurance coverage (Medicaid pending).   - PICC out.     - Off spiro, entresto, and lasix with hypotension yesterday.  - Now off beta blocker with low output.  - Continue digoxin 0.125 daily. Dig level 0.3  - There is concern that LBBB with dyssynchrony plays a significant role in his severe central MR (likely primarily functional). Underwent ICD implant 9/19. Unable   to place LV lead for CRT, has his bundle lead that is pacing but has not narrowed QRS. May need to consider Mitraclip (would need TEE). - He will be an LVAD candidate.  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  In this situation, not sure that Mitraclip will be enough to get him off inotrope.  He will need to start LVAD workup.  - Cardiac rehab following. 2. Prior ETOH Abuse: Knows he needs complete cessation. No change 3. Tobacco abuse:  Knows he needs complete cessation. No change. 4. Mitral regurgitation: Severe MR on echo 12/26/17 MR appears central, and occurs in the setting of severe LV dilation as well as septal-lateral dyssynchrony with wide LBBB.  - CRT may decrease MR but unable to place LV lead for CRT. Now has ICD 9/19. - Consider Mitraclip versus straight to LVAD.   - TEE on hold. .  5. Social: Have referred to social worker to help him get Medicaid. He has been working with DSS in [place]. Has also applied for disability and plans to call and check on this. Per CM, he would be a self pay if he needs home milrinone. Antibiotics can be paid through charity fund if he needs at [place]. 6.  ID:  Afebrile. UA negative.  Blood CX x 2 with NGTD -  Started on cefepime and vancomycin.  Possible source- new ICD or PICC.  He has had 2 ICD extractions in the past due to infection. ICD placed last week.  - With multiple ICD infections in past, think it would be a good idea to get formal ID consult, especially given consideration for LVAD.  - PICC out.  - Vancomycin/cefepime as above.  7. Hyperkalemia: Resolved.  8. Hyponatremia: sodium up to 132. Continue to follow daily. . 9. Sinus Tachycardia:  Stop amio  Per Dr Taylor looks like he was in Sinus Tach.  This has resolved, HR in 90s today.  10. Thrombocytopenia: Platelets down today to 98, ?related to infection.  He is on Lovenox and drop fairly precipitous, think HIT unlikely.  Follow closely.   Set up TEE for tomorrow.    Amy Clegg NP-C  01/07/2018 7:35 AM   Patient seen with NP, agree with the above note.    He appears much better today.  Afebrile, WBCs 5.  PICC line removed yesterday. Feels better, no dyspnea.    On exam, JVP 8-9 cm.  Regular S1S2 +S3.  No edema.   Patient is now milrinone dependent.  At this point, I think he needs LVAD.  Suspect LV is too large and hypokinetic to improve significantly with Mitraclip (discussed with Dr. Cooper).  Complicating plan for LVAD is fever/sepsis syndrome yesterday with recent ICD placement.  Hopefully, this was PICC line infection and PICC is now out.  He seems much better today.  - Resume Lasix 80 qam/40 qpm.  - BP still soft, will leave off Entresto and spironolactone for now.  - Continue milrinone 0.25.   - He will need LVAD.  Will discuss timing with team.  Workup ongoing, will need to see surgeon.   Afebrile, WBCs 5 today.  PICC out and fevers resolved.  ID following.  ICD site looks ok.  Hopefully, this was PICC infection and not related to ICD.  - Will plan TEE tomorrow to look at ICD leads and also to assess MR though suspect not going to be a good Mitraclip candidate.  - He is on  vancomycin/cefepime.    Will follow ID recommendations regarding antibiotics.    Suspect sinus tachy yesterday with fever, sepsis syndrome. Can stop amiodarone.   Platelets lower today, large drop and suspect not HIT.  Has been on Lovenox.  Will follow, may be related to acute infection.   Amardeep Beckers 01/07/2018 9:07 AM      
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
## 2018-01-08 ENCOUNTER — Encounter (HOSPITAL_COMMUNITY): Payer: Self-pay

## 2018-01-08 ENCOUNTER — Inpatient Hospital Stay (HOSPITAL_COMMUNITY): Payer: Medicaid Other

## 2018-01-08 ENCOUNTER — Encounter (HOSPITAL_COMMUNITY)
Admission: RE | Disposition: A | Discharge status: Home Health | Payer: Self-pay | Source: Ambulatory Visit | Attending: Cardiology

## 2018-01-08 DIAGNOSIS — I340 Nonrheumatic mitral (valve) insufficiency: Secondary | ICD-10-CM

## 2018-01-08 HISTORY — PX: TEE WITHOUT CARDIOVERSION: SHX5443

## 2018-01-08 LAB — CBC
HCT: 40.3 % (ref 39.0–52.0)
HEMOGLOBIN: 14 g/dL (ref 13.0–17.0)
MCH: 32.9 pg (ref 26.0–34.0)
MCHC: 34.7 g/dL (ref 30.0–36.0)
MCV: 94.8 fL (ref 78.0–100.0)
Platelets: 121 10*3/uL — ABNORMAL LOW (ref 150–400)
RBC: 4.25 MIL/uL (ref 4.22–5.81)
RDW: 12.8 % (ref 11.5–15.5)
WBC: 6 10*3/uL (ref 4.0–10.5)

## 2018-01-08 LAB — PULMONARY FUNCTION TEST
DL/VA % PRED: 80 %
DL/VA: 3.56 ml/min/mmHg/L
DLCO cor % pred: 61 %
DLCO cor: 17.3 ml/min/mmHg
DLCO unc % pred: 60 %
DLCO unc: 17 ml/min/mmHg
FEF 25-75 Pre: 3.39 L/sec
FEF2575-%PRED-PRE: 112 %
FEV1-%Pred-Pre: 90 %
FEV1-Pre: 2.7 L
FEV1FVC-%Pred-Pre: 107 %
FEV6-%PRED-PRE: 86 %
FEV6-PRE: 3.16 L
FEV6FVC-%Pred-Pre: 103 %
FVC-%Pred-Pre: 84 %
FVC-Pre: 3.16 L
Pre FEV1/FVC ratio: 86 %
Pre FEV6/FVC Ratio: 100 %
RV % pred: 130 %
RV: 2.47 L
TLC % PRED: 94 %
TLC: 6.03 L

## 2018-01-08 LAB — BASIC METABOLIC PANEL
ANION GAP: 8 (ref 5–15)
BUN: 13 mg/dL (ref 6–20)
CO2: 27 mmol/L (ref 22–32)
Calcium: 9 mg/dL (ref 8.9–10.3)
Chloride: 99 mmol/L (ref 98–111)
Creatinine, Ser: 0.99 mg/dL (ref 0.61–1.24)
GFR calc non Af Amer: >60 mL/min (ref >60)
Glucose, Bld: 103 mg/dL — ABNORMAL HIGH (ref 70–99)
Potassium: 4 mmol/L (ref 3.5–5.1)
SODIUM: 134 mmol/L — AB (ref 135–145)

## 2018-01-08 SURGERY — ECHOCARDIOGRAM, TRANSESOPHAGEAL
Anesthesia: Moderate Sedation

## 2018-01-08 MED ORDER — MIDAZOLAM HCL 10 MG/2ML IJ SOLN
INTRAMUSCULAR | Status: DC | PRN
  Administered 2018-01-08: 2 mg via INTRAVENOUS
  Administered 2018-01-08: 1 mg via INTRAVENOUS
  Administered 2018-01-08: 2 mg via INTRAVENOUS

## 2018-01-08 MED ORDER — ADULT MULTIVITAMIN W/MINERALS CH
1.0000 | ORAL_TABLET | Freq: Every day | ORAL | Status: DC
  Administered 2018-01-08 – 2018-01-11 (×4): 1 via ORAL
  Filled 2018-01-08 (×4): qty 1

## 2018-01-08 MED ORDER — DIPHENHYDRAMINE HCL 50 MG/ML IJ SOLN
INTRAMUSCULAR | Status: AC
  Filled 2018-01-08: qty 1

## 2018-01-08 MED ORDER — BUTAMBEN-TETRACAINE-BENZOCAINE 2-2-14 % EX AERO
INHALATION_SPRAY | CUTANEOUS | Status: DC | PRN
  Administered 2018-01-08: 2 via TOPICAL

## 2018-01-08 MED ORDER — FENTANYL CITRATE (PF) 100 MCG/2ML IJ SOLN
INTRAMUSCULAR | Status: AC
  Filled 2018-01-08: qty 2

## 2018-01-08 MED ORDER — FENTANYL CITRATE (PF) 100 MCG/2ML IJ SOLN
INTRAMUSCULAR | Status: DC | PRN
  Administered 2018-01-08 (×3): 25 ug via INTRAVENOUS

## 2018-01-08 MED ORDER — MIDAZOLAM HCL 5 MG/ML IJ SOLN
INTRAMUSCULAR | Status: AC
  Filled 2018-01-08: qty 2

## 2018-01-08 NOTE — Plan of Care (Signed)
°  Problem: Education: °Goal: Knowledge of General Education information will improve °Description: Including pain rating scale, medication(s)/side effects and non-pharmacologic comfort measures °Outcome: Progressing °  °Problem: Health Behavior/Discharge Planning: °Goal: Ability to manage health-related needs will improve °Outcome: Progressing °  °Problem: Nutrition: °Goal: Adequate nutrition will be maintained °Outcome: Progressing °  °Problem: Coping: °Goal: Level of anxiety will decrease °Outcome: Progressing °  °Problem: Elimination: °Goal: Will not experience complications related to bowel motility °Outcome: Progressing °Goal: Will not experience complications related to urinary retention °Outcome: Progressing °  °Problem: Pain Managment: °Goal: General experience of comfort will improve °Outcome: Progressing °  °Problem: Safety: °Goal: Ability to remain free from injury will improve °Outcome: Progressing °  °Problem: Skin Integrity: °Goal: Risk for impaired skin integrity will decrease °Outcome: Progressing °  °

## 2018-01-08 NOTE — Progress Notes (Signed)
Back from the endo dept by wheelchair awake and alert.

## 2018-01-08 NOTE — Progress Notes (Signed)
INFECTIOUS DISEASE PROGRESS NOTE  ID: Carl Ferguson is a 52 y.o. male with  Active Problems:   ICD (implantable cardioverter-defibrillator) in place   Acute systolic CHF (congestive heart failure) (HCC)   Goals of care, counseling/discussion   Palliative care encounter   CHF (congestive heart failure) (HCC)   Fever  Subjective: No complaints, up walking.   Abtx:  Anti-infectives (From admission, onward)   Start     Dose/Rate Route Frequency Ordered Stop   01/06/18 1400  ceFEPIme (MAXIPIME) 1 g in sodium chloride 0.9 % 100 mL IVPB  Status:  Discontinued     1 g 200 mL/hr over 30 Minutes Intravenous Every 8 hours 01/06/18 0715 01/08/18 1023   01/06/18 0900  vancomycin (VANCOCIN) IVPB 750 mg/150 ml premix  Status:  Discontinued     750 mg 150 mL/hr over 60 Minutes Intravenous Every 12 hours 01/06/18 0857 01/08/18 1023   01/06/18 0730  ceFEPIme (MAXIPIME) 2 g in sodium chloride 0.9 % 100 mL IVPB     2 g 200 mL/hr over 30 Minutes Intravenous  Once 01/06/18 0715 01/06/18 1227   01/02/18 1830  ceFAZolin (ANCEF) IVPB 1 g/50 mL premix     1 g 100 mL/hr over 30 Minutes Intravenous Every 6 hours 01/02/18 1552 01/03/18 0617   01/02/18 0800  ceFAZolin (ANCEF) IVPB 2g/100 mL premix     2 g 200 mL/hr over 30 Minutes Intravenous To Cath Lab 01/01/18 0823 01/02/18 1216   01/02/18 0800  gentamicin (GARAMYCIN) 80 mg in sodium chloride 0.9 % 500 mL irrigation     80 mg Irrigation To Cath Lab 01/01/18 0823 01/02/18 1448   01/01/18 0830  ceFAZolin (ANCEF) IVPB 2g/100 mL premix  Status:  Discontinued     2 g 200 mL/hr over 30 Minutes Intravenous On call 01/01/18 0822 01/01/18 0823   01/01/18 0830  gentamicin (GARAMYCIN) 80 mg in sodium chloride 0.9 % 500 mL irrigation  Status:  Discontinued     80 mg Irrigation On call 01/01/18 0822 01/01/18 0823      Medications:  Scheduled: . aspirin EC  81 mg Oral Daily  . digoxin  0.125 mg Oral Daily  . enoxaparin (LOVENOX) injection  40 mg  Subcutaneous Q24H  . feeding supplement (ENSURE ENLIVE)  237 mL Oral Q24H  . furosemide  40 mg Oral QPM  . furosemide  80 mg Oral Daily  . multivitamin with minerals  1 tablet Oral Daily  . nicotine  21 mg Transdermal Daily  . pantoprazole  40 mg Oral Daily  . sodium chloride flush  10-40 mL Intracatheter Q12H  . sodium chloride flush  3 mL Intravenous Q12H    Objective: Vital signs in last 24 hours: Temp:  [97.8 F (36.6 C)-98.7 F (37.1 C)] 98.6 F (37 C) (09/25 0815) Pulse Rate:  [86-109] 101 (09/25 0916) Resp:  [15-26] 20 (09/25 0916) BP: (92-124)/(67-96) 124/91 (09/25 0916) SpO2:  [93 %-100 %] 96 % (09/25 0916)   General appearance: alert, cooperative and no distress Chest wall: no tenderness, no fluctuance at pacer site.   Lab Results Recent Labs    01/07/18 0313 01/08/18 0259 01/08/18 0853  WBC 5.4  --  6.0  HGB 14.2  --  14.0  HCT 40.3  --  40.3  NA 132* 134*  --   K 3.7 4.0  --   CL 98 99  --   CO2 23 27  --   BUN 18 13  --  CREATININE 1.11 0.99  --    Liver Panel No results for input(s): PROT, ALBUMIN, AST, ALT, ALKPHOS, BILITOT, BILIDIR, IBILI in the last 72 hours. Sedimentation Rate No results for input(s): ESRSEDRATE in the last 72 hours. C-Reactive Protein No results for input(s): CRP in the last 72 hours.  Microbiology: Recent Results (from the past 240 hour(s))  MRSA PCR Screening     Status: None   Collection Time: 12/30/17  6:43 PM  Result Value Ref Range Status   MRSA by PCR NEGATIVE NEGATIVE Final    Comment:        The GeneXpert MRSA Assay (FDA approved for NASAL specimens only), is one component of a comprehensive MRSA colonization surveillance program. It is not intended to diagnose MRSA infection nor to guide or monitor treatment for MRSA infections. Performed at [facility], [address], [place], [place] [address]   Surgical PCR screen     Status: None   Collection Time: 01/01/18 11:44 PM  Result Value Ref Range  Status   MRSA, PCR NEGATIVE NEGATIVE Final   Staphylococcus aureus NEGATIVE NEGATIVE Final    Comment: (NOTE) The Xpert SA Assay (FDA approved for NASAL specimens in patients 77 years of age and older), is one component of a comprehensive surveillance program. It is not intended to diagnose infection nor to guide or monitor treatment. Performed at [facility], [address], [place], [place] [address]   Culture, blood (Routine X 2) w Reflex to ID Panel     Status: None (Preliminary result)   Collection Time: 01/06/18  7:40 AM  Result Value Ref Range Status   Specimen Description BLOOD LEFT ANTECUBITAL  Final   Special Requests   Final    BOTTLES DRAWN AEROBIC ONLY Blood Culture adequate volume   Culture   Final    NO GROWTH 1 DAY Performed at [facility], [address], [place], [place] [address]    Report Status PENDING  Incomplete  Culture, blood (Routine X 2) w Reflex to ID Panel     Status: None (Preliminary result)   Collection Time: 01/06/18  7:47 AM  Result Value Ref Range Status   Specimen Description BLOOD LEFT HAND  Final   Special Requests   Final    BOTTLES DRAWN AEROBIC ONLY Blood Culture adequate volume   Culture   Final    NO GROWTH 1 DAY Performed at [facility], [address] [place], [place] [address]    Report Status PENDING  Incomplete    Studies/Results: No results found.   Assessment/Plan: Fever (related to [facility]?) Poor dentition CHF 15-20%  Total days of antibiotics: 3   TEE negative His BCx have been negative.  Will stop his anbx and see how he does.  Planning for LVAD if afebrile.  Available as needed.           Johny Sax MD, FACP Infectious Diseases (pager) [phone] [email or web address] 01/08/2018, 11:31 AM  LOS: 9 days

## 2018-01-08 NOTE — Progress Notes (Signed)
Nutrition Follow-up  DOCUMENTATION CODES:   Not applicable  INTERVENTION:   -Continue Ensure Enlive po daily, each supplement provides 350 kcal and 20 grams of protein -MVI with minerals daily  NUTRITION DIAGNOSIS:   Increased nutrient needs related to acute illness as evidenced by estimated needs.  Ongoing  GOAL:   Patient will meet greater than or equal to 90% of their needs  Progressing  MONITOR:   PO intake, Supplement acceptance, Weight trends, Labs, I & O's  REASON FOR ASSESSMENT:   Consult LVAD Eval  ASSESSMENT:   52 y.o. male with systolic CHF, NICM, chronic LBBB, tobacco abuse, prior ETOH abuse, prior substance abuse, and h/o of device placements with extractions due to infection. Pt has long complicated history of CHF. Care was initially in [place] [facility] at [facility]. ECHO 9/12 showed EF is around 20% with severe dilation, severe central mitral regurgitation, and severe L atrial enlargement. Weight had gone down from 150 to 146 pounds in setting of poor appetite. Taking all medications except potassium. Usually smokes 1 pack cigarettes every 2 days. He has difficulty paying for medications. Occasional ETOH use, no drugs. He presented 9/16 for scheduled cath which showed low output heart failure. Pt will be admitted for milrinone initiation and EP consideration.  9/16- started on milrinone 9/25- s/p TEE, which revealed severe MR  Reviewed I/O's: +1 L x 24 hours and -4.7 L since admission  Wt has been stable since admission  Pt recently returned from TEE. He was meeting with palliative care team at time of visit.   Pt with good appetite. Noted meal completion 100%. Pt also consuming Ensure supplements.   Per MD notes, plan for dental extractions in the future. PICC out due to suspicion that fevers may have been PICC-related. ID continues to follow for antibiotic management.  Labs reviewed: Na: 134.   Diet Order:   Diet Order            Diet 2 gram sodium Room  service appropriate? Yes; Fluid consistency: Thin; Fluid restriction: 2000 mL Fluid  Diet effective now              EDUCATION NEEDS:   No education needs have been identified at this time  Skin:  Skin Assessment: Reviewed RN Assessment  Last BM:  01/07/18  Height:   Ht Readings from Last 1 Encounters:  12/30/17 5\' 7"  (1.702 m)    Weight:   Wt Readings from Last 1 Encounters:  01/07/18 66.4 kg    Ideal Body Weight:  67.27 kg  BMI:  Body mass index is 22.93 kg/m.  Estimated Nutritional Needs:   Kcal:  1700-1900  Protein:  85-100 grams  Fluid:  1.7-1.9 L    Emma Birchler A. Mayford Knife, RD, LDN, CDE Pager: [phone] After hours Pager: [phone]

## 2018-01-08 NOTE — Progress Notes (Signed)
Palliative:  Cavanaugh is sitting on side of bed today and is feeling well and in good spirits. He has completed TEE and is awaiting PFTs now. He is hopeful for moving forward with LVAD in the future (he understands that mitraclip is likely not going to be the answer). He is knowledgeable about the next steps regarding dental work that is in process to be scheduled and is continuing to work with CSW and financial counselors to [employer].   We also discussed HCPOA and I assisted him in filling this out (will request notary to formally complete). He has opted for Marny Lowenstein his daughter to be his primary HCPOA and his mother Marni Griffon to be secondary. We discussed Living Will but he says that he trusts his daughter and that they have had discussions. He says that he trusts "she will not be selfish" to keep him alive on machines or feeding tubes in a facility. He would not want to go through aggressive measures without hope of improvement. He trusts her judgement to make the best decisions for him. No further questions/concerns. Emotional support provided.   20 min  Yong Channel, NP Palliative Medicine Team Pager # [phone] (M-F 8a-5p) Team Phone # [phone] (Nights/Weekends)

## 2018-01-08 NOTE — CV Procedure (Signed)
Procedure: TEE  Sedation: Versed 5 mg IV, Fentanyl 75 mcg IV  Indication: Mitral regurgitation  Findings: Please see echo section for full report.  Severely dilated left ventricle with diffuse hypokinesis, septal-lateral dyssynchrony.  EF 15-20%.  The RV was mildly dilated with mildly decreased systolic function.  Mild right atrial enlargement.  ICD wires in right heart showed no vegetation.  Mild TR, peak RV-RA gradient 23 mmHg.  No TV vegetation.  Moderate left atrial enlargement with no LA appendage thrombus.  There was central mitral regurgitation, appeared to be 2 jets.  PISA ERO 0.27 cm^2 but only from 1 jet.  Suspect overall severe functional MR.  MV leaflets thin, no vegetation.  Systolic flattening in pulmonary vein doppler pattern.  Trileaflet aortic valve with no stenosis, trivial regurgitation.  Normal caliber aorta, no plaque.  No PFO/ASD by color doppler.   Carl Ferguson 01/08/2018 8:14 AM
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
## 2018-01-08 NOTE — Progress Notes (Signed)
Patient ID: Carl Ferguson, male   DOB: [birth year], 52 y.o.   MRN: [id number]     Advanced Heart Failure Rounding Note  PCP-Cardiologist: Garwin Brothers, MD   Subjective:    Events: - Started on milrinone 0.25 mcg/kg/min 9/16  for low output on cath.  - Underwent ICD implant 9/19. Unable to place LV lead, had His lead placed but this has not significantly narrowed his QRS. A-sensed, V-paced on tele. - Milrinone decreased to 0.125 on 9/20, stopped 9/22 but did not tolerate with increased dyspnea/symptoms and fall in co-ox to 50%.  - Milrinone increased to 0.25 mcg on 9/22 - Febrile to 103 am 9/22. PICC out. ID consulted. Started on vanc and cefepime. Also Aflutter. Started on amio drip.  - TEE (9/25): No endocarditis, ICD leads without vegetation.  The LV was severely dilated with EF 15-20% and dyssynchrony.  Mildly dilated and mildly hypokinetic RV.  Severe functional MR.    Remains on milrinone 0.25 mcg/kg/min.  Did not tolerate weaning over weekend. He is afebrile, feels good today.  Blood cultures negative.  No dyspnea.   RHC 12/30/17: RA mean 3 RV 35/6 PA 41/20, mean 30 PCWP mean 16 Oxygen saturations: PA 56% AO 97% Cardiac Output (Fick) 2.5  Cardiac Index (Fick) 1.4 PVR 5.6 WU  Objective:   Weight Range: 66.4 kg Body mass index is 22.93 kg/m.   Vital Signs:   Temp:  [97.8 F (36.6 C)-98.7 F (37.1 C)] 97.8 F (36.6 C) (09/25 0702) Pulse Rate:  [93-109] 93 (09/25 0800) Resp:  [15-26] 21 (09/25 0800) BP: (92-119)/(68-96) 92/70 (09/25 0800) SpO2:  [97 %-100 %] 98 % (09/25 0800) Last BM Date: 01/07/18  Weight change: Filed Weights   01/05/18 0300 01/06/18 0353 01/07/18 0500  Weight: 65.3 kg 66.7 kg 66.4 kg    Intake/Output:   Intake/Output Summary (Last 24 hours) at 01/08/2018 0814 Last data filed at 01/08/2018 0800 Gross per 24 hour  Intake 2213.05 ml  Output 1100 ml  Net 1113.05 ml      Physical Exam   General: NAD Neck: No JVD, no thyromegaly or  thyroid nodule.  Lungs: Clear to auscultation bilaterally with normal respiratory effort. CV: Lateral PMI.  Heart regular S1/S2, no S3/S4, 2/6 HSM apex.  No peripheral edema.   Abdomen: Soft, nontender, no hepatosplenomegaly, no distention.  Skin: Intact without lesions or rashes.  Neurologic: Alert and oriented x 3.  Psych: Normal affect. Extremities: No clubbing or cyanosis.  HEENT: Normal.    Telemetry   NSR 90s-100s personally reviewed.   Labs    CBC Recent Labs    01/06/18 0452 01/07/18 0313  WBC 7.2 5.4  NEUTROABS  --  3.1  HGB 16.1 14.2  HCT 45.3 40.3  MCV 94.2 94.2  PLT PLATELET CLUMPS NOTED ON SMEAR, UNABLE TO ESTIMATE 98*   Basic Metabolic Panel Recent Labs    15/72/62 0313 01/08/18 0259  NA 132* 134*  K 3.7 4.0  CL 98 99  CO2 23 27  GLUCOSE 135* 103*  BUN 18 13  CREATININE 1.11 0.99  CALCIUM 8.9 9.0   Liver Function Tests No results for input(s): AST, ALT, ALKPHOS, BILITOT, PROT, ALBUMIN in the last 72 hours. No results for input(s): LIPASE, AMYLASE in the last 72 hours. Cardiac Enzymes No results for input(s): CKTOTAL, CKMB, CKMBINDEX, TROPONINI in the last 72 hours.  BNP: BNP (last 3 results) Recent Labs    06/13/17 2143 07/02/17 1531 12/30/17 0858  BNP 1,699.9* 532.9*  4,087.0*    ProBNP (last 3 results) No results for input(s): PROBNP in the last 8760 hours.   D-Dimer No results for input(s): DDIMER in the last 72 hours. Hemoglobin A1C No results for input(s): HGBA1C in the last 72 hours. Fasting Lipid Panel No results for input(s): CHOL, HDL, LDLCALC, TRIG, CHOLHDL, LDLDIRECT in the last 72 hours. Thyroid Function Tests No results for input(s): TSH, T4TOTAL, T3FREE, THYROIDAB in the last 72 hours.  Invalid input(s): FREET3  Other results:   Imaging    No results found.   Medications:     Scheduled Medications: . [MAR Hold] aspirin EC  81 mg Oral Daily  . [MAR Hold] digoxin  0.125 mg Oral Daily  . [MAR Hold]  enoxaparin (LOVENOX) injection  40 mg Subcutaneous Q24H  . [MAR Hold] feeding supplement (ENSURE ENLIVE)  237 mL Oral Q24H  . [MAR Hold] furosemide  40 mg Oral QPM  . [MAR Hold] furosemide  80 mg Oral Daily  . [MAR Hold] nicotine  21 mg Transdermal Daily  . [MAR Hold] pantoprazole  40 mg Oral Daily  . [MAR Hold] sodium chloride flush  10-40 mL Intracatheter Q12H  . [MAR Hold] sodium chloride flush  3 mL Intravenous Q12H    Infusions: . [MAR Hold] sodium chloride    . sodium chloride 20 mL/hr at 01/06/18 1900  . sodium chloride 20 mL/hr at 01/07/18 1249  . [MAR Hold] ceFEPime (MAXIPIME) IV 1 g (01/08/18 0626)  . milrinone 0.25 mcg/kg/min (01/07/18 2011)  . [MAR Hold] vancomycin 750 mg (01/07/18 2016)    PRN Medications: [MAR Hold] sodium chloride, [MAR Hold] acetaminophen, [MAR Hold] albuterol, [MAR Hold] hydrocortisone cream, [MAR Hold] ondansetron (ZOFRAN) IV, [MAR Hold] oxyCODONE-acetaminophen, [MAR Hold] sodium chloride flush, [MAR Hold] sodium chloride flush    Patient Profile   Carl Ferguson is a 52 y.o. male with systolic CHF, NICM, Chronic LBBB, Tobacco abuse, prior ETOH abuse, prior substance abuse, and h/o of device placements with extractions due to infection.  Admitted after schedule RHC yesterday with low output and started on milrinone.   Assessment/Plan   1. Chronic Systolic CHF: Nonischemic cardiomyopathy, diagnosed in 2011, initially followed at [facility].  LHC (3/19) with no significant CAD.  CPX (3/19) submaximal, but suggestive of mild to moderate HF limitation. HIV negative 3/19, history of drug/ETOH abuse in the past, no drugs now and has cut back ETOH considerably.  Echo this admission showed EF 20% with severe LV dilation, severe MR.  NYHA class IIIb symptoms with volume overload at admission.  He has a wide LBBB.  2 subcutaneous ICD systems were removed for infection at [facility]. He now has an ICD with His bundle lead.  Unable to place LV lead on 9/19.  He has  been His pacing but this has not narrowed his QRS and is not likely to help his LV function or MR.  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 TEE today showed EF 15-20% with severely dilated LV (>7 cm) and mild RV systolic dysfunction.  With an LV this large, think Mitraclip would be unlikely to be effective enough to avoid LVAD.  On exam, volume status looks ok.  No co-ox because PICC out with fever but symptomatically doing well and euvolemic.  - Off spironolactone and Entresto with low BP.  - Continue milrinone 0.25 - Continue Lasix 80 qam/40 qpm.   - Now off beta blocker with low output.  -  Continue digoxin 0.125 daily, recent level ok.  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 Underwent ICD implant 9/19. Unable to place LV lead for CRT, has his bundle lead that is pacing but has not narrowed QRS.  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  In this situation, do not think that Mitraclip will be enough to get him off inotrope.  LVAD workup ongoing, working on [employer] coverage.  He had a high fever on Monday, resolved with PICC removal.  Afebrile and WBCs normal.  Seen by Dr. Donata Clay. Will need necrotic teeth out and afebrile for several days prior to LVAD implantation.  - Cardiac rehab following. 2. Prior ETOH Abuse: Knows he needs complete cessation. No change 3. Tobacco abuse:  Knows he needs complete cessation. No change. 4. Mitral regurgitation: Severe MR on echo 12/26/17 MR appears central, and occurs in the setting of severe LV dilation as well as septal-lateral dyssynchrony with wide LBBB. CRT may decrease MR but unable to place LV lead for CRT. Now has ICD 9/19.  LV > 7 cm, think Mitraclip unlikely to be helpful enough to avoid LVAD.  5. Social: Have referred to social worker to help him get Medicaid. He has been working  with DSS in [place]. Has also applied for disability and plans to call and check on this. Per CM, he would be a self pay if he needs home milrinone. Antibiotics can be paid through charity fund if he needs at [place]. 6. ID: Afebrile now and blood cultures negative.  TEE did not show endocarditis or vegetation on ICD leads.  Suspect fever may have been PICC-related, PICC now out.  He has had 2 ICD extractions in the past due to infection. ICD placed last week.  - ID following, continue cefepime and vancomycin for now.  - Will follow ID guidance regarding replacement of central access. Will likely need to have negative cultures and afebrile for several days.  7. Hyponatremia: Resolving. 8. Thrombocytopenia: Platelets down today to 98 yesterday, ?related to infection.  He is on Lovenox and drop fairly precipitous, think HIT unlikely.  Needs CBC today, ordered.  Marca Ancona 01/08/2018 8:14 AM

## 2018-01-08 NOTE — Interval H&P Note (Signed)
History and Physical Interval Note:  01/08/2018 7:47 AM  Carl Ferguson  has presented today for surgery, with the diagnosis of MR  The various methods of treatment have been discussed with the patient and family. After consideration of risks, benefits and other options for treatment, the patient has consented to  Procedure(s): TRANSESOPHAGEAL ECHOCARDIOGRAM (TEE) (N/A) as a surgical intervention .  The patient's history has been reviewed, patient examined, no change in status, stable for surgery.  I have reviewed the patient's chart and labs.  Questions were answered to the patient's satisfaction.     Caci Orren [employer]

## 2018-01-09 ENCOUNTER — Encounter (HOSPITAL_COMMUNITY): Payer: Self-pay | Admitting: Cardiology

## 2018-01-09 LAB — CBC WITH DIFFERENTIAL/PLATELET
Abs Immature Granulocytes: 0 10*3/uL (ref 0.0–0.1)
Basophils Absolute: 0 10*3/uL (ref 0.0–0.1)
Basophils Relative: 1 %
EOS PCT: 4 %
Eosinophils Absolute: 0.3 10*3/uL (ref 0.0–0.7)
HEMATOCRIT: 37.1 % — AB (ref 39.0–52.0)
Hemoglobin: 13.2 g/dL (ref 13.0–17.0)
Immature Granulocytes: 0 %
LYMPHS ABS: 1.5 10*3/uL (ref 0.7–4.0)
Lymphocytes Relative: 24 %
MCH: 33 pg (ref 26.0–34.0)
MCHC: 35.6 g/dL (ref 30.0–36.0)
MCV: 92.8 fL (ref 78.0–100.0)
MONO ABS: 0.8 10*3/uL (ref 0.1–1.0)
Monocytes Relative: 13 %
Neutro Abs: 3.7 10*3/uL (ref 1.7–7.7)
Neutrophils Relative %: 58 %
Platelets: 142 10*3/uL — ABNORMAL LOW (ref 150–400)
RBC: 4 MIL/uL — ABNORMAL LOW (ref 4.22–5.81)
RDW: 12.5 % (ref 11.5–15.5)
WBC: 6.3 10*3/uL (ref 4.0–10.5)

## 2018-01-09 LAB — BASIC METABOLIC PANEL
ANION GAP: 10 (ref 5–15)
BUN: 15 mg/dL (ref 6–20)
CALCIUM: 8.9 mg/dL (ref 8.9–10.3)
CO2: 24 mmol/L (ref 22–32)
Chloride: 100 mmol/L (ref 98–111)
Creatinine, Ser: 1.03 mg/dL (ref 0.61–1.24)
GFR calc Af Amer: >60 mL/min (ref >60)
GFR calc non Af Amer: >60 mL/min (ref >60)
GLUCOSE: 158 mg/dL — AB (ref 70–99)
Potassium: 3.7 mmol/L (ref 3.5–5.1)
Sodium: 134 mmol/L — ABNORMAL LOW (ref 135–145)

## 2018-01-09 LAB — FACTOR 5 LEIDEN

## 2018-01-09 MED ORDER — SPIRONOLACTONE 12.5 MG HALF TABLET
12.5000 mg | ORAL_TABLET | Freq: Every day | ORAL | Status: DC
  Administered 2018-01-09 – 2018-01-11 (×3): 12.5 mg via ORAL
  Filled 2018-01-09 (×3): qty 1

## 2018-01-09 NOTE — Progress Notes (Signed)
   Patient Status: [facility] - In-pt  Assessment and Plan: Patient in need of venous access.   Tunneled central catheter placement  _____________________________________________________________________  History of Present Illness: Carl Ferguson is a 52 y.o. male   Heart failure Home milrinone Need for central line  Allergies and medications reviewed.   Review of Systems: A 12 point ROS discussed and pertinent positives are indicated in the HPI above.  All other systems are negative.   Vital Signs: BP 104/75 (BP Location: Right Arm)   Pulse 93   Temp 97.6 F (36.4 C) (Oral)   Resp 15   Ht 5\' 7"  (1.702 m)   Wt 146 lb 1.6 oz (66.3 kg)   SpO2 98%   BMI 22.88 kg/m   Physical Exam  Constitutional: He is oriented to person, place, and time.  Neurological: He is alert and oriented to person, place, and time.  Skin: Skin is warm and dry.  Psychiatric: He has a normal mood and affect. His behavior is normal. Judgment and thought content normal.  Vitals reviewed.    Imaging reviewed.   Labs:  COAGS: Recent Labs    06/24/17 1640 12/26/17 1249  INR 1.1 1.15    BMP: Recent Labs    01/06/18 0452 01/07/18 0313 01/08/18 0259 01/09/18 0242  NA 129* 132* 134* 134*  K 5.3* 3.7 4.0 3.7  CL 93* 98 99 100  CO2 26 23 27 24   GLUCOSE 127* 135* 103* 158*  BUN 19 18 13 15   CALCIUM 9.2 8.9 9.0 8.9  CREATININE 1.27* 1.11 0.99 1.03  GFRNONAA >60 >60 >60 >60  GFRAA >60 >60 >60 >60   Pt is aware of procedure benefits and risks Including but not limited to Infection; bleeding; vessel damage Agreeable to proceed Consent signed and in chart    Electronically Signed: Wilbon Obenchain A, PA-C 01/09/2018, 4:22 PM   I spent a total of 15 minutes in face to face in clinical consultation, greater than 50% of which was counseling/coordinating care for venous access.Patient ID: Carl Ferguson, male   DOB: 01-Jul-1965, 52 y.o.   MRN: [id number]

## 2018-01-09 NOTE — Progress Notes (Signed)
At bedside for PICC placement , Per Patient has new pacemaker that was placed 7 days ago on right chest and has had several other pacemakers that was removed from the left side . Recommend for patient to go to IR and have a picc placed under fluoro due to recent placement of pacer. RN to notify MD.

## 2018-01-09 NOTE — Progress Notes (Signed)
Patient ID: Carl Ferguson, male   DOB: 08-Sep-1965, 52 y.o.   MRN: [id number]     Advanced Heart Failure Rounding Note  PCP-Cardiologist: Garwin Brothers, MD   Subjective:    Events: - Started on milrinone 0.25 mcg/kg/min 9/16  for low output on cath.  - Underwent ICD implant 9/19. Unable to place LV lead, had His lead placed but this has not significantly narrowed his QRS. A-sensed, V-paced on tele. - Milrinone decreased to 0.125 on 9/20, stopped 9/22 but did not tolerate with increased dyspnea/symptoms and fall in co-ox to 50%.  - Milrinone increased to 0.25 mcg on 9/22 - Febrile to 103 am 9/22. PICC out. ID consulted. Started on vanc and cefepime. Also Aflutter. Started on amio drip.  - TEE (9/25): No endocarditis, ICD leads without vegetation.  The LV was severely dilated with EF 15-20% and dyssynchrony.  Mildly dilated and mildly hypokinetic RV.  Severe functional MR.    Remains on milrinone 0.25 mcg/kg/min.  Did not tolerate weaning over weekend. He is afebrile, feels good today.  Blood cultures negative.  No dyspnea.   RHC 12/30/17: RA mean 3 RV 35/6 PA 41/20, mean 30 PCWP mean 16 Oxygen saturations: PA 56% AO 97% Cardiac Output (Fick) 2.5  Cardiac Index (Fick) 1.4 PVR 5.6 WU  Objective:   Weight Range: 66.3 kg Body mass index is 22.88 kg/m.   Vital Signs:   Temp:  [97.5 F (36.4 C)-98.6 F (37 C)] 97.9 F (36.6 C) (09/26 0813) Pulse Rate:  [86-107] 107 (09/26 0813) Resp:  [15-27] 27 (09/26 0813) BP: (92-124)/(71-91) 112/87 (09/26 0813) SpO2:  [94 %-100 %] 100 % (09/26 0813) Weight:  [66.3 kg] 66.3 kg (09/26 0300) Last BM Date: 01/07/18  Weight change: Filed Weights   01/06/18 0353 01/07/18 0500 01/09/18 0300  Weight: 66.7 kg 66.4 kg 66.3 kg    Intake/Output:   Intake/Output Summary (Last 24 hours) at 01/09/2018 0824 Last data filed at 01/09/2018 0600 Gross per 24 hour  Intake 814.8 ml  Output 500 ml  Net 314.8 ml      Physical Exam   General:  NAD Neck: JVP 8 cm, no thyromegaly or thyroid nodule.  Lungs: Clear to auscultation bilaterally with normal respiratory effort. CV: Nondisplaced PMI.  Heart regular S1/S2, +S3, 2/6 HSM apex.  No peripheral edema.   Abdomen: Soft, nontender, no hepatosplenomegaly, no distention.  Skin: Intact without lesions or rashes.  Neurologic: Alert and oriented x 3.  Psych: Normal affect. Extremities: No clubbing or cyanosis.  HEENT: Normal.   Telemetry   NSR 90s-100s personally reviewed.   Labs    CBC Recent Labs    01/07/18 0313 01/08/18 0853 01/09/18 0242  WBC 5.4 6.0 6.3  NEUTROABS 3.1  --  3.7  HGB 14.2 14.0 13.2  HCT 40.3 40.3 37.1*  MCV 94.2 94.8 92.8  PLT 98* 121* 142*   Basic Metabolic Panel Recent Labs    40/98/11 0259 01/09/18 0242  NA 134* 134*  K 4.0 3.7  CL 99 100  CO2 27 24  GLUCOSE 103* 158*  BUN 13 15  CREATININE 0.99 1.03  CALCIUM 9.0 8.9   Liver Function Tests No results for input(s): AST, ALT, ALKPHOS, BILITOT, PROT, ALBUMIN in the last 72 hours. No results for input(s): LIPASE, AMYLASE in the last 72 hours. Cardiac Enzymes No results for input(s): CKTOTAL, CKMB, CKMBINDEX, TROPONINI in the last 72 hours.  BNP: BNP (last 3 results) Recent Labs    06/13/17 2143  07/02/17 1531 12/30/17 0858  BNP 1,699.9* 532.9* 4,087.0*    ProBNP (last 3 results) No results for input(s): PROBNP in the last 8760 hours.   D-Dimer No results for input(s): DDIMER in the last 72 hours. Hemoglobin A1C No results for input(s): HGBA1C in the last 72 hours. Fasting Lipid Panel No results for input(s): CHOL, HDL, LDLCALC, TRIG, CHOLHDL, LDLDIRECT in the last 72 hours. Thyroid Function Tests No results for input(s): TSH, T4TOTAL, T3FREE, THYROIDAB in the last 72 hours.  Invalid input(s): FREET3  Other results:   Imaging    No results found.   Medications:     Scheduled Medications: . aspirin EC  81 mg Oral Daily  . digoxin  0.125 mg Oral Daily  .  enoxaparin (LOVENOX) injection  40 mg Subcutaneous Q24H  . feeding supplement (ENSURE ENLIVE)  237 mL Oral Q24H  . furosemide  40 mg Oral QPM  . furosemide  80 mg Oral Daily  . multivitamin with minerals  1 tablet Oral Daily  . nicotine  21 mg Transdermal Daily  . pantoprazole  40 mg Oral Daily  . sodium chloride flush  10-40 mL Intracatheter Q12H  . sodium chloride flush  3 mL Intravenous Q12H    Infusions: . sodium chloride    . milrinone 0.25 mcg/kg/min (01/08/18 1657)    PRN Medications: sodium chloride, acetaminophen, albuterol, hydrocortisone cream, ondansetron (ZOFRAN) IV, oxyCODONE-acetaminophen, sodium chloride flush, sodium chloride flush    Patient Profile   Carl Ferguson is a 52 y.o. male with systolic CHF, NICM, Chronic LBBB, Tobacco abuse, prior ETOH abuse, prior substance abuse, and h/o of device placements with extractions due to infection.  Admitted after schedule RHC yesterday with low output and started on milrinone.   Assessment/Plan   1. Chronic Systolic CHF: Nonischemic cardiomyopathy, diagnosed in 2011, initially followed at [facility].  LHC (3/19) with no significant CAD.  CPX (3/19) submaximal, but suggestive of mild to moderate HF limitation. HIV negative 3/19, history of drug/ETOH abuse in the past, no drugs now and has cut back ETOH considerably.  Echo this admission showed EF 20% with severe LV dilation, severe MR.  NYHA class IIIb symptoms with volume overload at admission.  He has a wide LBBB.  2 subcutaneous ICD systems were removed for infection at [facility]. He now has an ICD with His bundle lead.  Unable to place LV lead on 9/19.  He has been His pacing but this has not narrowed his QRS and is not likely to help his LV function or MR.  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 TEE today showed EF 15-20% with severely dilated LV (>7 cm) and mild RV systolic dysfunction.  With an LV this  large, think Mitraclip would be unlikely to be effective enough to avoid LVAD.  On exam, volume status looks ok.  No co-ox because PICC out with fever but symptomatically doing well and euvolemic.  - Restart spironolactone 12.5 daily, BP too soft for Entresto.   - Continue milrinone 0.25 - Continue Lasix 80 qam/40 qpm.   - Now off beta blocker with low output.  - Continue digoxin 0.125 daily, recent level ok.  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 Underwent ICD implant 9/19. Unable to place LV lead for CRT, has his bundle lead that is pacing but has not narrowed QRS.  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  In this situation, do not think that Mitraclip will be enough to get him off inotrope.  LVAD workup ongoing, working on [employer] coverage.  He had a high fever on Monday, resolved with PICC removal.  Afebrile and WBCs normal.  Seen by Dr. Donata Clay. Will need necrotic teeth out and afebrile for several days prior to LVAD implantation. Will check with ID today regarding central access replacement, if ok will place PICC back today.  - Cardiac rehab following. 2. Prior ETOH Abuse: Knows he needs complete cessation. No change 3. Tobacco abuse:  Knows he needs complete cessation. No change. 4. Mitral regurgitation: Severe MR on echo 12/26/17 MR appears central, and occurs in the setting of severe LV dilation as well as septal-lateral dyssynchrony with wide LBBB. CRT may decrease MR but unable to place LV lead for CRT. Now has ICD 9/19.  LV > 7 cm, think Mitraclip unlikely to be helpful enough to avoid LVAD.  5. Social: Have referred to social worker to help him get Medicaid. He has been working with DSS in [place]. Has also applied for disability and plans to call and check on this. Per CM, he would be a self pay if he needs home milrinone. Antibiotics can be paid through charity fund if he  needs at [place]. 6. ID: Afebrile now and blood cultures negative.  TEE did not show endocarditis or vegetation on ICD leads.  Suspect fever may have been PICC-related, PICC now out.  He has had 2 ICD extractions in the past due to infection. ICD placed last week.  - Per ID, now off abx.  Will check with ID regarding replacement of central access.  7. Hyponatremia: Resolving. 8. Thrombocytopenia: Resolving, think related to acute infection/inflammation.   When he gets central access replaced, will likely let him go home on milrinone.  Will then need to return for dental procedure.  After teeth out, will then need to have LVAD surgery scheduled.  We are still working on Training and development officer.   Marca Ancona 01/09/2018 8:24 AM

## 2018-01-09 NOTE — Progress Notes (Signed)
CSW met with patient to check in today- no needs- hopeful that he can get PICC line placed and be released soon  CSW will follow up with patient in [place], [place] Worker [phone]

## 2018-01-09 NOTE — Plan of Care (Signed)
°  Problem: Education: °Goal: Knowledge of General Education information will improve °Description: Including pain rating scale, medication(s)/side effects and non-pharmacologic comfort measures °Outcome: Progressing °  °Problem: Health Behavior/Discharge Planning: °Goal: Ability to manage health-related needs will improve °Outcome: Progressing °  °Problem: Nutrition: °Goal: Adequate nutrition will be maintained °Outcome: Progressing °  °Problem: Coping: °Goal: Level of anxiety will decrease °Outcome: Progressing °  °Problem: Elimination: °Goal: Will not experience complications related to bowel motility °Outcome: Progressing °Goal: Will not experience complications related to urinary retention °Outcome: Progressing °  °Problem: Pain Managment: °Goal: General experience of comfort will improve °Outcome: Progressing °  °Problem: Safety: °Goal: Ability to remain free from injury will improve °Outcome: Progressing °  °Problem: Skin Integrity: °Goal: Risk for impaired skin integrity will decrease °Outcome: Progressing °  °

## 2018-01-10 ENCOUNTER — Inpatient Hospital Stay (HOSPITAL_COMMUNITY): Payer: Medicaid Other

## 2018-01-10 LAB — CBC WITH DIFFERENTIAL/PLATELET
Abs Immature Granulocytes: 0 10*3/uL (ref 0.0–0.1)
Basophils Absolute: 0.1 10*3/uL (ref 0.0–0.1)
Basophils Relative: 1 %
EOS ABS: 0.3 10*3/uL (ref 0.0–0.7)
Eosinophils Relative: 4 %
HEMATOCRIT: 42.6 % (ref 39.0–52.0)
Hemoglobin: 14.8 g/dL (ref 13.0–17.0)
Immature Granulocytes: 0 %
LYMPHS ABS: 2.1 10*3/uL (ref 0.7–4.0)
Lymphocytes Relative: 27 %
MCH: 32.7 pg (ref 26.0–34.0)
MCHC: 34.7 g/dL (ref 30.0–36.0)
MCV: 94.2 fL (ref 78.0–100.0)
Monocytes Absolute: 0.8 10*3/uL (ref 0.1–1.0)
Monocytes Relative: 10 %
Neutro Abs: 4.5 10*3/uL (ref 1.7–7.7)
Neutrophils Relative %: 58 %
PLATELETS: 193 10*3/uL (ref 150–400)
RBC: 4.52 MIL/uL (ref 4.22–5.81)
RDW: 12.7 % (ref 11.5–15.5)
WBC: 7.8 10*3/uL (ref 4.0–10.5)

## 2018-01-10 LAB — BASIC METABOLIC PANEL
ANION GAP: 9 (ref 5–15)
BUN: 16 mg/dL (ref 6–20)
CO2: 30 mmol/L (ref 22–32)
CREATININE: 1.02 mg/dL (ref 0.61–1.24)
Calcium: 9.4 mg/dL (ref 8.9–10.3)
Chloride: 98 mmol/L (ref 98–111)
GFR calc Af Amer: >60 mL/min (ref >60)
GFR calc non Af Amer: >60 mL/min (ref >60)
GLUCOSE: 92 mg/dL (ref 70–99)
Potassium: 4.3 mmol/L (ref 3.5–5.1)
Sodium: 137 mmol/L (ref 135–145)

## 2018-01-10 LAB — COOXEMETRY PANEL
CARBOXYHEMOGLOBIN: 1.6 % — AB (ref 0.5–1.5)
METHEMOGLOBIN: 0.8 % (ref 0.0–1.5)
O2 Saturation: 61.8 %
Total hemoglobin: 14.5 g/dL (ref 12.0–16.0)

## 2018-01-10 MED ORDER — LIDOCAINE HCL 1 % IJ SOLN
INTRAMUSCULAR | Status: DC | PRN
  Administered 2018-01-10: 5 mL

## 2018-01-10 MED ORDER — LIDOCAINE HCL 1 % IJ SOLN
INTRAMUSCULAR | Status: AC
  Filled 2018-01-10: qty 20

## 2018-01-10 MED ORDER — FUROSEMIDE 10 MG/ML IJ SOLN
80.0000 mg | Freq: Two times a day (BID) | INTRAMUSCULAR | Status: DC
  Administered 2018-01-10 – 2018-01-11 (×3): 80 mg via INTRAVENOUS
  Filled 2018-01-10 (×3): qty 8

## 2018-01-10 NOTE — Progress Notes (Signed)
   Anticipate d/c tomorrow on milrinone 0.25 mcg. I spoke to Jeri Modena with [facility]. SHe is aware of potential d/c for tomorrow.   Discussed home medications with Sam RN Case Manager. She will provide medicaitons with the [facility] program prior to discharge.   HF follow up set.   Amy Clegg NP-C  4:49 PM

## 2018-01-10 NOTE — Procedures (Signed)
LUE PICC 43 cm SVC RA EBL 0 Comp 0

## 2018-01-10 NOTE — Care Management Note (Addendum)
Case Management Note  Patient Details  Name: Carl Ferguson MRN: [id number] Date of Birth: [birth year]  Subjective/Objective:   Pt was admitted post RHC yesterday for failed ICD                   Action/Plan:  PTA independent from home.     Expected Discharge Date:                  Expected Discharge Plan:     In-House Referral:     Discharge planning Services  CM Consult  Post Acute Care Choice:    Choice offered to:  Patient  DME Arranged:  IV pump/equipment DME Agency:  [facility]  HH Arranged:  RN [facility] Agency:     Status of Service:  In process, will continue to follow  If discussed at Long Length of Stay Meetings, dates discussed:    Additional Comments: 01/10/2018  Update:  Pt will not receive meds from HF team, pt will need MATCH.  MATCH given and copays overridden - medication reconciliation not yet completed however HF team confirmed pt will only discharge home on Lasix, Spirolactone and Digoxin  Pt will discharge home on IV milrinone as bridge to potential LVAD - [facility] has accepted pt as charity and will also provide HHRN.  HF team to determine if pt will receive other new medications from HF group prior to discharge - if not pt will need MATCH.  CM provided pt free 30 day entresto card.  Pt continues to refuse CM to set up appt with local clinic - pt requested CM provide [facility] info on AVS-  HF team aware that pt does not have PCP and will sign HH orders for [facility] ongoing  01/03/18 Pt now being considered for LVAD, still on milrinone.    01/02/18 Pt is currently from home, plan is to wean milrinone post new ICD placement. CM will continue to follow  Cherylann Parr, RN 01/10/2018, 4:03 PM

## 2018-01-10 NOTE — Progress Notes (Signed)
2 Days Post-Op Procedure(s) (LRB): TRANSESOPHAGEAL ECHOCARDIOGRAM (TEE) (N/A) Subjective: Patient continues to feel well on milrinone Dental extractions of necrotic teeth will be scheduled for next week and patient will probably be discharged. I reviewed the procedure of HeartMate 3 implantation with the patient again and have strongly recommended he continue to abstain from tobacco and alcohol when he returns home before surgery.  Objective: Vital signs in last 24 hours: Temp:  [97.6 F (36.4 C)-98 F (36.7 C)] 98 F (36.7 C) (09/27 0817) Pulse Rate:  [93-104] 98 (09/27 0817) Cardiac Rhythm: Ventricular paced (09/27 0740) Resp:  [13-31] 17 (09/27 0817) BP: (99-107)/(73-87) 101/82 (09/27 0817) SpO2:  [98 %-100 %] 98 % (09/27 0817) Weight:  [66.5 kg] 66.5 kg (09/27 0517)  Hemodynamic parameters for last 24 hours:    Intake/Output from previous day: 09/26 0701 - 09/27 0700 In: 1004.1 [P.O.:960; I.V.:44.1] Out: 0  Intake/Output this shift: No intake/output data recorded.  Alert and comfortable Lungs clear No edema No abdominal tenderness  Lab Results: Recent Labs    01/09/18 0242 01/10/18 0552  WBC 6.3 7.8  HGB 13.2 14.8  HCT 37.1* 42.6  PLT 142* 193   BMET:  Recent Labs    01/09/18 0242 01/10/18 0552  NA 134* 137  K 3.7 4.3  CL 100 98  CO2 24 30  GLUCOSE 158* 92  BUN 15 16  CREATININE 1.03 1.02  CALCIUM 8.9 9.4    PT/INR: No results for input(s): LABPROT, INR in the last 72 hours. ABG    Component Value Date/Time   HCO3 22.6 12/30/2017 0812   HCO3 22.6 12/30/2017 0812   TCO2 24 12/30/2017 0812   TCO2 24 12/30/2017 0812   ACIDBASEDEF 2.0 12/30/2017 0812   ACIDBASEDEF 2.0 12/30/2017 0812   O2SAT 63.4 01/06/2018 0505   CBG (last 3)  No results for input(s): GLUCAP in the last 72 hours.  Assessment/Plan: S/P Procedure(s) (LRB): TRANSESOPHAGEAL ECHOCARDIOGRAM (TEE) (N/A) Patient progressing towards HeartMate 3 implantation. Fever has  resolved. No significant TR on last echo. Home milrinone through recently placed PICC line then dental extractions.  Patient will need to wait 3 to 5 days after dental surgery before implantation of LVAD. Plan discussed with patient.  LOS: 11 days    Carl Ferguson 01/10/2018

## 2018-01-10 NOTE — Progress Notes (Signed)
Patient taken by transporter via bed to IR for PICC line placement under fluoroscopy.

## 2018-01-10 NOTE — Progress Notes (Signed)
Patient ID: Carl Ferguson, male   DOB: 1965/04/29, 52 y.o.   MRN: [id number]     Advanced Heart Failure Rounding Note  PCP-Cardiologist: Garwin Brothers, MD   Subjective:    Events: - Started on milrinone 0.25 mcg/kg/min 9/16  for low output on cath.  - Underwent ICD implant 9/19. Unable to place LV lead, had His lead placed but this has not significantly narrowed his QRS. A-sensed, V-paced on tele. - Milrinone decreased to 0.125 on 9/20, stopped 9/22 but did not tolerate with increased dyspnea/symptoms and fall in co-ox to 50%.  - Milrinone increased to 0.25 mcg on 9/22 - Febrile to 103 am 9/22. PICC out. ID consulted. Started on vanc and cefepime. Also Aflutter. Started on amio drip.  - TEE (9/25): No endocarditis, ICD leads without vegetation.  The LV was severely dilated with EF 15-20% and dyssynchrony.  Mildly dilated and mildly hypokinetic RV.  Severe functional MR.    Remains on milrinone 0.25 mcg/kg/min.  Did not tolerate last weekend. He is afebrile, feels good today.  Blood cultures negative.  No dyspnea. Weight is up some.  Still waiting for central access placement, needs IR.   RHC 12/30/17: RA mean 3 RV 35/6 PA 41/20, mean 30 PCWP mean 16 Oxygen saturations: PA 56% AO 97% Cardiac Output (Fick) 2.5  Cardiac Index (Fick) 1.4 PVR 5.6 WU  Objective:   Weight Range: 66.5 kg Body mass index is 22.96 kg/m.   Vital Signs:   Temp:  [97.6 F (36.4 C)-97.9 F (36.6 C)] 97.9 F (36.6 C) (09/27 0339) Pulse Rate:  [93-107] 99 (09/27 0339) Resp:  [13-31] 29 (09/27 0339) BP: (99-112)/(73-87) 106/84 (09/27 0339) SpO2:  [98 %-100 %] 100 % (09/27 0339) Weight:  [66.5 kg] 66.5 kg (09/27 0517) Last BM Date: 01/07/18  Weight change: Filed Weights   01/07/18 0500 01/09/18 0300 01/10/18 0517  Weight: 66.4 kg 66.3 kg 66.5 kg    Intake/Output:   Intake/Output Summary (Last 24 hours) at 01/10/2018 0730 Last data filed at 01/10/2018 0400 Gross per 24 hour  Intake 1004.1 ml   Output -  Net 1004.1 ml      Physical Exam   General: NAD Neck: JVP 9-10 cm, no thyromegaly or thyroid nodule.  Lungs: Clear to auscultation bilaterally with normal respiratory effort. CV: Nondisplaced PMI.  Heart regular S1/S2, +S3, 2/6 HSM apex.  No peripheral edema.   Abdomen: Soft, nontender, no hepatosplenomegaly, no distention.  Skin: Intact without lesions or rashes.  Neurologic: Alert and oriented x 3.  Psych: Normal affect. Extremities: No clubbing or cyanosis.  HEENT: Normal.    Telemetry   NSR 100s personally reviewed.   Labs    CBC Recent Labs    01/09/18 0242 01/10/18 0552  WBC 6.3 7.8  NEUTROABS 3.7 4.5  HGB 13.2 14.8  HCT 37.1* 42.6  MCV 92.8 94.2  PLT 142* 193   Basic Metabolic Panel Recent Labs    11/55/20 0242 01/10/18 0552  NA 134* 137  K 3.7 4.3  CL 100 98  CO2 24 30  GLUCOSE 158* 92  BUN 15 16  CREATININE 1.03 1.02  CALCIUM 8.9 9.4   Liver Function Tests No results for input(s): AST, ALT, ALKPHOS, BILITOT, PROT, ALBUMIN in the last 72 hours. No results for input(s): LIPASE, AMYLASE in the last 72 hours. Cardiac Enzymes No results for input(s): CKTOTAL, CKMB, CKMBINDEX, TROPONINI in the last 72 hours.  BNP: BNP (last 3 results) Recent Labs  06/13/17 2143 07/02/17 1531 12/30/17 0858  BNP 1,699.9* 532.9* 4,087.0*    ProBNP (last 3 results) No results for input(s): PROBNP in the last 8760 hours.   D-Dimer No results for input(s): DDIMER in the last 72 hours. Hemoglobin A1C No results for input(s): HGBA1C in the last 72 hours. Fasting Lipid Panel No results for input(s): CHOL, HDL, LDLCALC, TRIG, CHOLHDL, LDLDIRECT in the last 72 hours. Thyroid Function Tests No results for input(s): TSH, T4TOTAL, T3FREE, THYROIDAB in the last 72 hours.  Invalid input(s): FREET3  Other results:   Imaging    No results found.   Medications:     Scheduled Medications: . aspirin EC  81 mg Oral Daily  . digoxin  0.125 mg  Oral Daily  . enoxaparin (LOVENOX) injection  40 mg Subcutaneous Q24H  . feeding supplement (ENSURE ENLIVE)  237 mL Oral Q24H  . furosemide  80 mg Intravenous BID  . multivitamin with minerals  1 tablet Oral Daily  . nicotine  21 mg Transdermal Daily  . pantoprazole  40 mg Oral Daily  . sodium chloride flush  10-40 mL Intracatheter Q12H  . sodium chloride flush  3 mL Intravenous Q12H  . spironolactone  12.5 mg Oral Daily    Infusions: . sodium chloride    . milrinone 0.25 mcg/kg/min (01/10/18 0723)    PRN Medications: sodium chloride, acetaminophen, albuterol, hydrocortisone cream, ondansetron (ZOFRAN) IV, oxyCODONE-acetaminophen, sodium chloride flush, sodium chloride flush    Patient Profile   Carl Ferguson is a 52 y.o. male with systolic CHF, NICM, Chronic LBBB, Tobacco abuse, prior ETOH abuse, prior substance abuse, and h/o of device placements with extractions due to infection.  Admitted after schedule RHC yesterday with low output and started on milrinone.   Assessment/Plan   1. Chronic Systolic CHF: Nonischemic cardiomyopathy, diagnosed in 2011, initially followed at [place]/[facility].  LHC (3/19) with no significant CAD.  CPX (3/19) submaximal, but suggestive of mild to moderate HF limitation. HIV negative 3/19, history of drug/ETOH abuse in the past, no drugs now and has cut back ETOH considerably.  Echo this admission showed EF 20% with severe LV dilation, severe MR.  NYHA class IIIb symptoms with volume overload at admission.  He has a wide LBBB.  2 subcutaneous ICD systems were removed for infection at [facility]. He now has an ICD with His bundle lead.  Unable to place LV lead on 9/19.  He has been His pacing but this has not narrowed his QRS and is not likely to help his LV function or MR.  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 TEE today showed EF 15-20% with severely dilated LV (>7 cm) and mild RV systolic  dysfunction.  With an LV this large, think Mitraclip would be unlikely to be effective enough to avoid LVAD.  Weight has trended up some, looks mildly volume overloaded.  No co-ox because PICC out with fever but symptomatically doing well.  - Continue spironolactone 12.5 daily, BP too soft for Entresto.   - Continue milrinone 0.25 - Lasix 80 mg IV bid, can likely resume po Lasix tomorrow.   - Now off beta blocker with low output.  - Continue digoxin 0.125 daily, recent level ok.  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 Underwent ICD implant 9/19. Unable to place LV lead for CRT, has his bundle lead that is pacing  but has not narrowed QRS.  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  In this situation, do not think that Mitraclip will be enough to get him off inotrope.  LVAD workup ongoing, working on [employer] coverage.  He had a high fever on Monday, resolved with PICC removal.  Afebrile and WBCs normal.  Seen by Dr. Donata Clay. Will need necrotic teeth out and afebrile for several days prior to LVAD implantation. Waiting for central access today (IR to place).  Plan to diurese today, possibly home on milrinone tomorrow to await dental procedure and then LVAD.   - Cardiac rehab following. 2. Prior ETOH Abuse: Knows he needs complete cessation. No change 3. Tobacco abuse:  Knows he needs complete cessation. No change. 4. Mitral regurgitation: Severe MR on echo 12/26/17 MR appears central, and occurs in the setting of severe LV dilation as well as septal-lateral dyssynchrony with wide LBBB. CRT may decrease MR but unable to place LV lead for CRT. Now has ICD 9/19.  LV > 7 cm, think Mitraclip unlikely to be helpful enough to avoid LVAD.  5. Social: Have referred to social worker to help him get Medicaid. He has been working with DSS in [place]. Has also applied for disability and plans to call and  check on this. Per CM, he would be a self pay if he needs home milrinone. Antibiotics can be paid through charity fund if he needs at [place]. 6. ID: Afebrile now and blood cultures negative.  TEE did not show endocarditis or vegetation on ICD leads.  Suspect fever may have been PICC-related, PICC now out.  He has had 2 ICD extractions in the past due to infection. ICD placed last week.  - Per ID, now off abx.  Can replace central access for home milrinone today (will need IR).   7. Hyponatremia: Resolved.  8. Thrombocytopenia: Resolved, think related to acute infection/inflammation.   When he gets central access replaced, will likely let him go home on milrinone.  Will then need to return for dental procedure.  After teeth out, will then need to have LVAD surgery scheduled.  We are still working on Training and development officer.   Marca Ancona 01/10/2018 7:30 AM

## 2018-01-11 LAB — BASIC METABOLIC PANEL
Anion gap: 8 (ref 5–15)
BUN: 17 mg/dL (ref 6–20)
CHLORIDE: 99 mmol/L (ref 98–111)
CO2: 28 mmol/L (ref 22–32)
CREATININE: 0.93 mg/dL (ref 0.61–1.24)
Calcium: 9 mg/dL (ref 8.9–10.3)
Glucose, Bld: 115 mg/dL — ABNORMAL HIGH (ref 70–99)
Potassium: 3.6 mmol/L (ref 3.5–5.1)
SODIUM: 135 mmol/L (ref 135–145)

## 2018-01-11 LAB — CULTURE, BLOOD (ROUTINE X 2)
CULTURE: NO GROWTH
Culture: NO GROWTH
Special Requests: ADEQUATE
Special Requests: ADEQUATE

## 2018-01-11 LAB — CBC WITH DIFFERENTIAL/PLATELET
Abs Immature Granulocytes: 0 10*3/uL (ref 0.0–0.1)
Basophils Absolute: 0.1 10*3/uL (ref 0.0–0.1)
Basophils Relative: 1 %
EOS ABS: 0.4 10*3/uL (ref 0.0–0.7)
EOS PCT: 5 %
HEMATOCRIT: 40.2 % (ref 39.0–52.0)
HEMOGLOBIN: 14.1 g/dL (ref 13.0–17.0)
Immature Granulocytes: 0 %
LYMPHS ABS: 2.1 10*3/uL (ref 0.7–4.0)
LYMPHS PCT: 25 %
MCH: 32.9 pg (ref 26.0–34.0)
MCHC: 35.1 g/dL (ref 30.0–36.0)
MCV: 93.9 fL (ref 78.0–100.0)
MONO ABS: 0.9 10*3/uL (ref 0.1–1.0)
MONOS PCT: 10 %
NEUTROS PCT: 59 %
Neutro Abs: 4.9 10*3/uL (ref 1.7–7.7)
Platelets: 231 10*3/uL (ref 150–400)
RBC: 4.28 MIL/uL (ref 4.22–5.81)
RDW: 12.7 % (ref 11.5–15.5)
WBC: 8.4 10*3/uL (ref 4.0–10.5)

## 2018-01-11 LAB — COOXEMETRY PANEL
Carboxyhemoglobin: 1.3 % (ref 0.5–1.5)
Methemoglobin: 1.3 % (ref 0.0–1.5)
O2 SAT: 62.5 %
Total hemoglobin: 14.3 g/dL (ref 12.0–16.0)

## 2018-01-11 MED ORDER — MILRINONE LACTATE IN DEXTROSE 20-5 MG/100ML-% IV SOLN: 0.2500 ug/kg/min | INTRAVENOUS | 6 refills | Status: DC

## 2018-01-11 MED ORDER — SPIRONOLACTONE 25 MG PO TABS: 12.5000 mg | ORAL_TABLET | Freq: Every day | ORAL | 2 refills | Status: DC

## 2018-01-11 MED ORDER — POTASSIUM CHLORIDE CRYS ER 20 MEQ PO TBCR: 40.0000 meq | EXTENDED_RELEASE_TABLET | Freq: Once | ORAL | Status: DC

## 2018-01-11 MED ORDER — FUROSEMIDE 80 MG PO TABS: ORAL_TABLET | ORAL | 3 refills | Status: DC

## 2018-01-11 NOTE — Progress Notes (Signed)
Patient ID: Carl Ferguson, male   DOB: [birth year], 52 y.o.   MRN: [id number]     Advanced Heart Failure Rounding Note  PCP-Cardiologist: Garwin Brothers, MD   Subjective:    Events: - Started on milrinone 0.25 mcg/kg/min 9/16  for low output on cath.  - Underwent ICD implant 9/19. Unable to place LV lead, had His lead placed but this has not significantly narrowed his QRS. A-sensed, V-paced on tele. - Milrinone decreased to 0.125 on 9/20, stopped 9/22 but did not tolerate with increased dyspnea/symptoms and fall in co-ox to 50%.  - Milrinone increased to 0.25 mcg on 9/22 - Febrile to 103 am 9/22. PICC out. ID consulted. Started on vanc and cefepime. Also Aflutter. Started on amio drip.  - TEE (9/25): No endocarditis, ICD leads without vegetation.  The LV was severely dilated with EF 15-20% and dyssynchrony.  Mildly dilated and mildly hypokinetic RV.  Severe functional MR.    Remains on milrinone 0.25 mcg/kg/min.  Did not tolerate wean last weekend. Had single lumen PICC placed in left arm yesterday by IR. Feels fine. Anxious to go home. Weight down 3 pounds   RHC 12/30/17: RA mean 3 RV 35/6 PA 41/20, mean 30 PCWP mean 16 Oxygen saturations: PA 56% AO 97% Cardiac Output (Fick) 2.5  Cardiac Index (Fick) 1.4 PVR 5.6 WU  Objective:   Weight Range: 65.2 kg Body mass index is 22.51 kg/m.   Vital Signs:   Temp:  [97.4 F (36.3 C)-98 F (36.7 C)] 97.8 F (36.6 C) (09/28 0755) Pulse Rate:  [98-110] 110 (09/28 0755) Resp:  [13-29] 29 (09/28 0755) BP: (95-113)/(77-85) 110/85 (09/28 0755) SpO2:  [98 %-99 %] 99 % (09/28 0755) Weight:  [65.2 kg] 65.2 kg (09/28 0428) Last BM Date: 01/10/18  Weight change: Filed Weights   01/09/18 0300 01/10/18 0517 01/11/18 0428  Weight: 66.3 kg 66.5 kg 65.2 kg    Intake/Output:   Intake/Output Summary (Last 24 hours) at 01/11/2018 0923 Last data filed at 01/11/2018 0800 Gross per 24 hour  Intake 280 ml  Output 1200 ml  Net -920 ml       Physical Exam   General:  Sitting up in chair Well appearing. No resp difficulty HEENT: normal Neck: supple. no JVD. Carotids 2+ bilat; no bruits. No lymphadenopathy or thryomegaly appreciated. Cor: PMI laterally displaced. Regular tachy  2/6 MR Lungs: clear Abdomen: soft, nontender, nondistended. No hepatosplenomegaly. No bruits or masses. Good bowel sounds. Extremities: no cyanosis, clubbing, rash, edema  LUE picc  Neuro: alert & orientedx3, cranial nerves grossly intact. moves all 4 extremities w/o difficulty. Affect pleasant   Telemetry   NSR 100-105s personally reviewed.   Labs    CBC Recent Labs    01/10/18 0552 01/11/18 0408  WBC 7.8 8.4  NEUTROABS 4.5 4.9  HGB 14.8 14.1  HCT 42.6 40.2  MCV 94.2 93.9  PLT 193 231   Basic Metabolic Panel Recent Labs    40/98/11 0552 01/11/18 0408  NA 137 135  K 4.3 3.6  CL 98 99  CO2 30 28  GLUCOSE 92 115*  BUN 16 17  CREATININE 1.02 0.93  CALCIUM 9.4 9.0   Liver Function Tests No results for input(s): AST, ALT, ALKPHOS, BILITOT, PROT, ALBUMIN in the last 72 hours. No results for input(s): LIPASE, AMYLASE in the last 72 hours. Cardiac Enzymes No results for input(s): CKTOTAL, CKMB, CKMBINDEX, TROPONINI in the last 72 hours.  BNP: BNP (last 3 results) Recent Labs  06/13/17 2143 07/02/17 1531 12/30/17 0858  BNP 1,699.9* 532.9* 4,087.0*    ProBNP (last 3 results) No results for input(s): PROBNP in the last 8760 hours.   D-Dimer No results for input(s): DDIMER in the last 72 hours. Hemoglobin A1C No results for input(s): HGBA1C in the last 72 hours. Fasting Lipid Panel No results for input(s): CHOL, HDL, LDLCALC, TRIG, CHOLHDL, LDLDIRECT in the last 72 hours. Thyroid Function Tests No results for input(s): TSH, T4TOTAL, T3FREE, THYROIDAB in the last 72 hours.  Invalid input(s): FREET3  Other results:   Imaging    Irpicc Placement Left >5 Yrs Inc Img Guide  Result Date:  01/10/2018 INDICATION: Arrhythmia EXAM: LEFT UPPER EXTREMITY PICC LINE PLACEMENT WITH ULTRASOUND AND FLUOROSCOPIC GUIDANCE MEDICATIONS: None ANESTHESIA/SEDATION: None FLUOROSCOPY TIME:  Fluoroscopy Time:  minutes 36 seconds (2 mGy). COMPLICATIONS: None immediate. PROCEDURE: The patient was advised of the possible risks and complications and agreed to undergo the procedure. The patient was then brought to the angiographic suite for the procedure. The left arm was prepped with chlorhexidine, draped in the usual sterile fashion using maximum barrier technique (cap and mask, sterile gown, sterile gloves, large sterile sheet, hand hygiene and cutaneous antiseptic). Local anesthesia was attained by infiltration with 1% lidocaine. Ultrasound demonstrated patency of the basilic vein, and this was documented with an image. Under real-time ultrasound guidance, this vein was accessed with a 21 gauge micropuncture needle and image documentation was performed. The needle was exchanged over a guidewire for a peel-away sheath through which a 43 cm 5 [place] single lumen power injectable PICC was advanced, and positioned with its tip at the lower SVC/right atrial junction. Fluoroscopy during the procedure and fluoro spot radiograph confirms appropriate catheter position. The catheter was flushed, secured to the skin with Prolene sutures, and covered with a sterile dressing. IMPRESSION: Successful placement of a left arm PICC with sonographic and fluoroscopic guidance. The catheter is ready for use. Electronically Signed   By: Jolaine Click M.D.   On: 01/10/2018 12:54     Medications:     Scheduled Medications: . aspirin EC  81 mg Oral Daily  . digoxin  0.125 mg Oral Daily  . enoxaparin (LOVENOX) injection  40 mg Subcutaneous Q24H  . feeding supplement (ENSURE ENLIVE)  237 mL Oral Q24H  . furosemide  80 mg Intravenous BID  . multivitamin with minerals  1 tablet Oral Daily  . nicotine  21 mg Transdermal Daily  .  pantoprazole  40 mg Oral Daily  . potassium chloride  40 mEq Oral Once  . sodium chloride flush  10-40 mL Intracatheter Q12H  . sodium chloride flush  3 mL Intravenous Q12H  . spironolactone  12.5 mg Oral Daily    Infusions: . sodium chloride    . milrinone 0.25 mcg/kg/min (01/11/18 0324)    PRN Medications: sodium chloride, acetaminophen, albuterol, hydrocortisone cream, lidocaine, ondansetron (ZOFRAN) IV, oxyCODONE-acetaminophen, sodium chloride flush, sodium chloride flush    Patient Profile   Carl Ferguson is a 52 y.o. male with systolic CHF, NICM, Chronic LBBB, Tobacco abuse, prior ETOH abuse, prior substance abuse, and h/o of device placements with extractions due to infection.  Admitted after schedule RHC yesterday with low output and started on milrinone.   Assessment/Plan   1. Chronic Systolic CHF: Nonischemic cardiomyopathy, diagnosed in 2011, initially followed at [place]/[facility].  LHC (3/19) with no significant CAD.  CPX (3/19) submaximal, but suggestive of mild to moderate HF limitation. HIV negative 3/19, history of drug/ETOH abuse  in the past, no drugs now and has cut back ETOH considerably.  Echo this admission showed EF 20% with severe LV dilation, severe MR.  NYHA class IIIb symptoms with volume overload at admission.  He has a wide LBBB.  2 subcutaneous ICD systems were removed for infection at [facility]. He now has an ICD with His bundle lead.  Unable to place LV lead on 9/19.  He has been His pacing but this has not narrowed his QRS and is not likely to help his LV function or MR.  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 TEE today showed EF 15-20% with severely dilated LV (>7 cm) and mild RV systolic dysfunction.  With an LV this large, think Mitraclip would be unlikely to be effective enough to avoid LVAD.  Weight down. Volume looks good. Co-ox 63% on Milrinone - Continue spironolactone 12.5 daily, BP  too soft for Entresto.   - Continue milrinone 0.25. Have arranged for home milrinone with [facility] - Now off beta blocker with low output.  - Can resume po lasix 80 bid for home (was on 80/40) - Continue digoxin 0.125 daily, recent level ok.  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 Underwent ICD implant 9/19. Unable to place LV lead for CRT, has his bundle lead that is pacing but has not narrowed QRS.  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  In this situation, do not think that Mitraclip will be enough to get him off inotrope.  LVAD workup ongoing, working on [employer] coverage.  He had a high fever on Monday, resolved with PICC removal.  Afebrile and WBCs normal.  Seen by Dr. Donata Clay. Will need necrotic teeth out and afebrile for several days prior to LVAD implantation.  - PICC in place. Home today on IV milrinone tomorrow to await dental procedure and then LVAD.  [facility] aware and will set up home milrinone  - Cardiac rehab following. 2. Prior ETOH Abuse: Knows he needs complete cessation. No change 3. Tobacco abuse:  Knows he needs complete cessation. No change. 4. Mitral regurgitation: Severe MR on echo 12/26/17 MR appears central, and occurs in the setting of severe LV dilation as well as septal-lateral dyssynchrony with wide LBBB. CRT may decrease MR but unable to place LV lead for CRT. Now has ICD 9/19.  LV > 7 cm, think Mitraclip unlikely to be helpful enough to avoid LVAD.  5. Social: Have referred to social worker to help him get Medicaid. He has been working with DSS in [place]. Has also applied for disability and plans to call and check on this. Per CM, he would be a self pay if he needs home milrinone. Antibiotics can be paid through charity fund if he needs at [place]. 6. ID: Afebrile now and blood cultures negative.  TEE did not show endocarditis or vegetation on ICD leads.  Suspect fever  may have been PICC-related, PICC now out.  He has had 2 ICD extractions in the past due to infection. ICD placed last week.  - Per ID, now off abx. Central access replaced. 7. Hyponatremia: Resolved.  8. Thrombocytopenia: Resolved, think related to acute infection/inflammation.   Home today on milrinone.  After teeth out, will then need to have LVAD surgery scheduled.  We are still working on Training and development officer.   Home meds  Milrinone 0.25 mcg/kg/min Lasix 80 bid Digoxin 0.125  Spiro 12.5 daily ECASA 81 daily Kdur 40 daily   Arvilla Meres MD 01/11/2018 9:23 AM

## 2018-01-11 NOTE — Progress Notes (Signed)
Discharge instructions reviewed with patient and sister. Both deny any questions or concerns.  Sign copy of AVS in chart.

## 2018-01-11 NOTE — Discharge Summary (Signed)
Discharge Summary    Patient ID: Carl Ferguson MRN: [id number]; DOB: 07-15-1965  Admit date: 12/30/2017 Discharge date: 01/11/2018  Primary Care Provider: Patient, No Pcp Per  Primary Cardiologist: Garwin Brothers, MD  Primary Electrophysiologist:  None   Discharge Diagnoses    Principal Problem:   Acute systolic CHF (congestive heart failure) (HCC) Active Problems:   ICD (implantable cardioverter-defibrillator) in place   Goals of care, counseling/discussion   Palliative care encounter   CHF (congestive heart failure) (HCC)   Fever   Allergies Allergies  Allergen Reactions  . Norflex [Orphenadrine] Swelling  . Orphenadrine Citrate Swelling    Face swelling     Diagnostic Studies/Procedures    BIV ICD insertion CRT-D CONCLUSIONS:   1. Nonischemic cardiomyopathy with Left bundle-branch block and chronic New York Heart Association class III heart failure.   2. Unuccessful biventricular ICD implantation due to no suitable LV veins. 3. A His bundle lead was placed   4. No early apparent complications.   ICD Criteria Current LVEF:20%. Within 12 months prior to implant: Yes  Heart failure history: Yes, Class IV Cardiomyopathy history: Yes, Non-Ischemic Cardiomyopathy. Atrial Fibrillation/Atrial Flutter: No. Ventricular tachycardia history: No. Cardiac arrest history: No. History of syndromes with risk of sudden death: No. Previous ICD: Yes, Reason for ICD:  Primary prevention. Current ICD indication: Primary PPM indication: Yes. Pacing type: Ventricular. Greater than 40% RV pacing requirement anticipated. Indication: Complete Heart Block               Class I or II Bradycardia indication present: Yes Beta Blocker therapy for 3 or more months: Yes, prescribed.  Ace Inhibitor/ARB therapy for 3 or more months: Yes, prescribed.     Right heart cath 12/30/17: 1. Filling pressures nearly optimized.  2. Markedly low cardiac output.    I am going to admit him and  start milrinone gtt with low output.  Will discuss CRT device with EP.    Echo 12/26/17 Study Conclusions - Left ventricle: The cavity size was severely dilated. Systolic   function was severely reduced. The estimated ejection fraction   was in the range of 15% to 20%. Diffuse hypokinesis. Features are   consistent with a pseudonormal left ventricular filling pattern,   with concomitant abnormal relaxation and increased filling   pressure (grade 2 diastolic dysfunction). - Aortic valve: There was mild regurgitation. - Mitral valve: Mildly thickened, mildly calcified leaflets . There   was severe regurgitation. - Left atrium: The atrium was severely dilated. Volume/bsa, ES,   (1-plane Simpson&'s, A2C): 58.3 ml/m^2. - Tricuspid valve: There was mild regurgitation. - Pulmonary arteries: PA peak pressure: 46 mm Hg (S).  Impressions: - Compared to the prior study, there has been no significant   interval change.   History of Present Illness     Carl Ferguson is a 52 y.o. male with systolic CHF, NICM, Chronic LBBB, Tobacco abuse, prior ETOH abuse, prior substance abuse, and h/o of device placements with extractions due to infection.   Pt has a long complicated history of CHF.  Care was initially in [place] and at [facility].  Previously had EF down to 30% in 2011 at initial diagnosis.  In 4/13, he got a single chamber ICD.  In 9/13, he had implantation of a subcutaneous array for high DFTs while on amiodarone for VT.  Echo in 11/15 with EF 30%.  In 11/15, single chamber ICD, leads, and subcutaneous array all removed due to infection.  Discharged with [employer]. In  1/16, he had subcutaneous ICD placed.  In 9/16, this was explanted due to erosion.  EF on echo in 9/16 was 45%.  Echo in 11/17 showed EF 50%.    Admitted to [facility] 2/28 - 06/17/17 with SOB, orthopnea, and PND. He was off medicines for about 6 weeks prior due to insurance issues. Repeat Echo (3/19) significant for fall in EF to 15-20% with  severe LV dilation and severe diffuse hypokinesis, moderate-severe MR.   Followed up with Dr. Tomie China 06/24/17. Feeling better at that appointment. Referred for LHC and HF team. Dr Excell Seltzer performed [facility] March 2019. No significant CAD.  CPX 3/19 showed peak VO2 24 (68% predicted) with VE/VCO2 slope 37 with RER 0.91 (submaximal).  Probably mild to moderate functional impairment.   Echo 12/26/17 showed EF is around 20% with severe dilation, severe central mitral regurgitation. Severe LAE.   Seen in clinic 12/26/17. At previous visit, Lasix was increased to 40 mg twice a day. Pt was having ongoing cough. SOB with exertion just walking around his house. Denies PND. + Orthopnea, sleeps in a recliner.  Weight had gone down from 150 to 146 pounds in setting of poor appetite. Taking all medications except potassium. Usually smokes 1 pack cigarettes every 2 days. He has difficulty paying for medications.  Occasional ETOH use, no drugs.    He presented 12/30/17 for scheduled cath which showed low output heart failure. Pt was admitted for milrinone initiation and EP consideration.   Hospital Course     Consultants: EP  Chronic systolic heart failure Nonischemic cardiomyopathy, initially diagnosed 2011 Mitral regurgitation ICD in place with His lead LHC (3/19) with no significant CAD. CPX (3/19) submaximal, but suggestive of mild to moderate HF limitation. HIV negative 3/19, history of drug/ETOH abuse in the past, no drugs now and has cut back ETOH considerably. Echo this admission showed EF 20% with severe LV dilation, severe MR. NYHA class IIIb symptoms with volume overload at admission.  He has a wide LBBB. 2 subcutaneous ICD systems were removed for infection at [facility]. He now has an ICD with His bundle lead.  Unable to place LV lead on 9/19.  He has been His pacing but this has not narrowed his QRS and is not likely to help his LV function or MR. There is concern that LBBB with dyssynchrony plays a  significant role in his severe central MR (likely primarily functional).  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 TEE today showed EF 15-20% with severely dilated LV (>7 cm) and mild RV systolic dysfunction.  With an LV this large, think Mitraclip would be unlikely to be effective enough to avoid LVAD.  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  LVAD workup ongoing, working on [employer] coverage.  He had a high fever on Monday, resolved with PICC removal.  Afebrile and WBCs normal.  Seen by Dr. Donata Clay. Will need necrotic teeth out and afebrile for several days prior to LVAD implantation.   Today, 01/11/18, he appears near euvolemic with co-ox 63% on milrinone. Plan to discharge with the following medications: - spironolactone 12.5 mg daily - entresto held for marginal BP - no BB for low output - continue milrinone 0.25, will discharge home with milrinone [facility]) - continue  Lasix 80 mg BID - continue digoxin 0.125 mg daily   Pt seen and examined by Dr. Gala Romney and deemed stable for discharge.   _____________  Discharge  Vitals Blood pressure 110/85, pulse (!) 110, temperature 97.8 F (36.6 C), temperature source Oral, resp. rate (!) 29, height 5\' 7"  (1.702 m), weight 65.2 kg, SpO2 99 %.  Filed Weights   01/09/18 0300 01/10/18 0517 01/11/18 0428  Weight: 66.3 kg 66.5 kg 65.2 kg    Labs & Radiologic Studies    CBC Recent Labs    01/10/18 0552 01/11/18 0408  WBC 7.8 8.4  NEUTROABS 4.5 4.9  HGB 14.8 14.1  HCT 42.6 40.2  MCV 94.2 93.9  PLT 193 231   Basic Metabolic Panel Recent Labs    04/54/09 0552 01/11/18 0408  NA 137 135  K 4.3 3.6  CL 98 99  CO2 30 28  GLUCOSE 92 115*  BUN 16 17  CREATININE 1.02 0.93  CALCIUM 9.4 9.0   Liver Function Tests No results for input(s): AST, ALT, ALKPHOS, BILITOT, PROT, ALBUMIN in the last 72 hours. No results for input(s): LIPASE, AMYLASE in the last 72  hours. Cardiac Enzymes No results for input(s): CKTOTAL, CKMB, CKMBINDEX, TROPONINI in the last 72 hours. BNP Invalid input(s): POCBNP D-Dimer No results for input(s): DDIMER in the last 72 hours. Hemoglobin A1C No results for input(s): HGBA1C in the last 72 hours. Fasting Lipid Panel No results for input(s): CHOL, HDL, LDLCALC, TRIG, CHOLHDL, LDLDIRECT in the last 72 hours. Thyroid Function Tests No results for input(s): TSH, T4TOTAL, T3FREE, THYROIDAB in the last 72 hours.  Invalid input(s): FREET3 _____________  Romeo Apple  Result Date: 01/04/2018 CLINICAL DATA:  Pre operative evaluation for left ventricular assist device insertion. EXAM: ORTHOPANTOGRAM/PANORAMIC COMPARISON:  None. FINDINGS: There are multiple missing teeth. Caries in tooth 2 and 3. Periapical lucency of tooth 29. No other appreciable abnormalities. IMPRESSION: Dental caries of tooth 2 and tooth 3. Probable small periapical abscess around the roots of tooth 29. Electronically Signed   By: Francene Boyers M.D.   On: 01/04/2018 11:36   Dg Chest 2 View  Result Date: 01/04/2018 CLINICAL DATA:  Pre operative evaluation prior to left ventricular assist device insertion. EXAM: CHEST - 2 VIEW COMPARISON:  Chest x-ray dated 01/03/2018 FINDINGS: There is cardiomegaly. AICD in place. PICC tip just below the carina in the superior vena cava. Pulmonary vascularity is normal. Lungs are clear. No bone abnormality. IMPRESSION: No acute abnormalities.  Chronic cardiomegaly. Electronically Signed   By: Francene Boyers M.D.   On: 01/04/2018 11:31   Dg Chest 2 View  Result Date: 01/03/2018 CLINICAL DATA:  Status post defibrillator placement EXAM: CHEST - 2 VIEW COMPARISON:  12/31/2017 FINDINGS: Defibrillator is now seen in satisfactory position. No pneumothorax is noted. Left-sided PICC line is noted in satisfactory position. Cardiac shadow remains enlarged. No focal infiltrate is seen. IMPRESSION: Status post defibrillator placement  without acute abnormality. Electronically Signed   By: Alcide Clever M.D.   On: 01/03/2018 08:42   Dg Chest 2 View  Result Date: 12/31/2017 CLINICAL DATA:  Evaluate for CHF. EXAM: CHEST - 2 VIEW COMPARISON:  12/05/2017 FINDINGS: Left arm PICC line tip projects over the cavoatrial junction. Normal heart size. No pleural effusion or edema. No airspace opacities identified. The visualized osseous structures are unremarkable. IMPRESSION: 1. No evidence for CHF. Electronically Signed   By: Signa Kell M.D.   On: 12/31/2017 09:20   Ct Chest W Contrast  Result Date: 01/04/2018 CLINICAL DATA:  Nonischemic cardiomyopathy. Evaluation for insertion of left ventricular assist device. EXAM: CT CHEST, ABDOMEN, AND PELVIS WITH CONTRAST TECHNIQUE: Multidetector CT imaging of the  chest, abdomen and pelvis was performed following the standard protocol during bolus administration of intravenous contrast. CONTRAST:  OMNIPAQUE IOHEXOL 300 MG/ML  SOLN COMPARISON:  Chest x-ray dated 01/03/2018 FINDINGS: CT CHEST FINDINGS Cardiovascular: Cardiomegaly with marked dilatation of the left ventricle. AICD in place. No pericardial effusion. Mediastinum/Nodes: No enlarged mediastinal, hilar, or axillary lymph nodes. Thyroid gland, trachea, and esophagus demonstrate no significant findings. Lungs/Pleura: Lungs are clear. No pleural effusion or pneumothorax. Musculoskeletal: No chest wall mass or suspicious bone lesions identified. CT ABDOMEN PELVIS FINDINGS Hepatobiliary: No focal liver abnormality is seen. No gallstones, gallbladder wall thickening, or biliary dilatation. Pancreas: Unremarkable. No pancreatic ductal dilatation or surrounding inflammatory changes. Spleen: Normal in size without focal abnormality. Adrenals/Urinary Tract: Adrenal glands are unremarkable. Kidneys are normal, without renal calculi, focal lesion, or hydronephrosis. Bladder is unremarkable. Stomach/Bowel: Stomach is within normal limits. Appendix appears  normal. No evidence of bowel wall thickening, distention, or inflammatory changes. Vascular/Lymphatic: No significant vascular findings are present. No enlarged abdominal or pelvic lymph nodes. Reproductive: Prostate is unremarkable. Other: No abdominal wall hernia or abnormality. No abdominopelvic ascites. Musculoskeletal: No acute abnormality. Severe degenerative disc disease at L4-5 and to a lesser degree at L5-S1. Osteophytes fuse the sacroiliac joints. IMPRESSION: 1. Cardiomegaly with marked dilatation of the left ventricle. 2. Otherwise normal CT scan of the chest. 3. No significant abnormality of the abdomen. Degenerative disc disease in the lower lumbar spine. Electronically Signed   By: Francene Boyers M.D.   On: 01/04/2018 11:44   Ct Abdomen Pelvis W Contrast  Result Date: 01/04/2018 CLINICAL DATA:  Nonischemic cardiomyopathy. Evaluation for insertion of left ventricular assist device. EXAM: CT CHEST, ABDOMEN, AND PELVIS WITH CONTRAST TECHNIQUE: Multidetector CT imaging of the chest, abdomen and pelvis was performed following the standard protocol during bolus administration of intravenous contrast. CONTRAST:  OMNIPAQUE IOHEXOL 300 MG/ML  SOLN COMPARISON:  Chest x-ray dated 01/03/2018 FINDINGS: CT CHEST FINDINGS Cardiovascular: Cardiomegaly with marked dilatation of the left ventricle. AICD in place. No pericardial effusion. Mediastinum/Nodes: No enlarged mediastinal, hilar, or axillary lymph nodes. Thyroid gland, trachea, and esophagus demonstrate no significant findings. Lungs/Pleura: Lungs are clear. No pleural effusion or pneumothorax. Musculoskeletal: No chest wall mass or suspicious bone lesions identified. CT ABDOMEN PELVIS FINDINGS Hepatobiliary: No focal liver abnormality is seen. No gallstones, gallbladder wall thickening, or biliary dilatation. Pancreas: Unremarkable. No pancreatic ductal dilatation or surrounding inflammatory changes. Spleen: Normal in size without focal abnormality.  Adrenals/Urinary Tract: Adrenal glands are unremarkable. Kidneys are normal, without renal calculi, focal lesion, or hydronephrosis. Bladder is unremarkable. Stomach/Bowel: Stomach is within normal limits. Appendix appears normal. No evidence of bowel wall thickening, distention, or inflammatory changes. Vascular/Lymphatic: No significant vascular findings are present. No enlarged abdominal or pelvic lymph nodes. Reproductive: Prostate is unremarkable. Other: No abdominal wall hernia or abnormality. No abdominopelvic ascites. Musculoskeletal: No acute abnormality. Severe degenerative disc disease at L4-5 and to a lesser degree at L5-S1. Osteophytes fuse the sacroiliac joints. IMPRESSION: 1. Cardiomegaly with marked dilatation of the left ventricle. 2. Otherwise normal CT scan of the chest. 3. No significant abnormality of the abdomen. Degenerative disc disease in the lower lumbar spine. Electronically Signed   By: Francene Boyers M.D.   On: 01/04/2018 11:44   Dg Chest Port 1 View  Result Date: 01/06/2018 CLINICAL DATA:  Tachycardia EXAM: PORTABLE CHEST 1 VIEW COMPARISON:  January 04, 2018 FINDINGS: There is cardiomegaly. Pulmonary vascularity is normal. No adenopathy. Central catheter tip is in the superior vena cava.  No pneumothorax. Pacemaker leads are attached to the right atrium and right ventricle. No adenopathy. No bone lesions. IMPRESSION: Central catheter tip in superior vena cava. Pacemaker leads unchanged. No pneumothorax. Stable cardiomegaly. Lungs clear. Electronically Signed   By: Bretta Bang III M.D.   On: 01/06/2018 09:09   Irpicc Placement Left >5 [employer] Img Guide  Result Date: 01/10/2018 INDICATION: Arrhythmia EXAM: LEFT UPPER EXTREMITY PICC LINE PLACEMENT WITH ULTRASOUND AND FLUOROSCOPIC GUIDANCE MEDICATIONS: None ANESTHESIA/SEDATION: None FLUOROSCOPY TIME:  Fluoroscopy Time:  minutes 36 seconds (2 mGy). COMPLICATIONS: None immediate. PROCEDURE: The patient was advised of the  possible risks and complications and agreed to undergo the procedure. The patient was then brought to the angiographic suite for the procedure. The left arm was prepped with chlorhexidine, draped in the usual sterile fashion using maximum barrier technique (cap and mask, sterile gown, sterile gloves, large sterile sheet, hand hygiene and cutaneous antiseptic). Local anesthesia was attained by infiltration with 1% lidocaine. Ultrasound demonstrated patency of the basilic vein, and this was documented with an image. Under real-time ultrasound guidance, this vein was accessed with a 21 gauge micropuncture needle and image documentation was performed. The needle was exchanged over a guidewire for a peel-away sheath through which a 43 cm 5 [place] single lumen power injectable PICC was advanced, and positioned with its tip at the lower SVC/right atrial junction. Fluoroscopy during the procedure and fluoro spot radiograph confirms appropriate catheter position. The catheter was flushed, secured to the skin with Prolene sutures, and covered with a sterile dressing. IMPRESSION: Successful placement of a left arm PICC with sonographic and fluoroscopic guidance. The catheter is ready for use. Electronically Signed   By: Jolaine Click M.D.   On: 01/10/2018 12:54   [place] Ekg Site Rite  Result Date: 12/30/2017 If Site Rite image not attached, placement could not be confirmed due to current cardiac rhythm.  Disposition   Pt is being discharged home today in good condition.  Follow-up Plans & Appointments    Follow-up Information    [facility] Follow up on 01/14/2018.   Specialty:  Cardiology Why:  9:00AM, wound check visit Contact information: [address] [place] [address] [phone]       Marinus Maw, MD. Schedule an appointment as soon as possible for a visit on 04/14/2018.   Specialty:  Cardiology Why:  9:00AM Contact information: [address] [place] [address] [phone]        [facility]. Call.   Why:  Please call and request post hospital discharge follow up appt Contact information: [address] [place] [address] [phone]       [facility] Follow up on 01/27/2018.   Specialty:  Cardiology Why:  at 08:30 Garage Code 1700  Contact information: [address] [id number] [place] [address] [phone]       Bensimhon, Bevelyn Buckles, MD Follow up in 1 week(s).   Specialty:  Cardiology Contact information: [address] [place] [address] [phone]          Discharge Instructions    Diet - low sodium heart healthy   Complete by:  As directed    Increase activity slowly   Complete by:  As directed       Discharge Medications   Allergies as of 01/11/2018      Reactions  Norflex [orphenadrine] Swelling   Orphenadrine Citrate Swelling   Face swelling      Medication List    STOP taking these medications   metoprolol succinate 50 MG 24 hr tablet Commonly known as:  TOPROL-XL   sacubitril-valsartan 24-26 MG Commonly known as:  ENTRESTO     TAKE these medications   albuterol 108 (90 Base) MCG/ACT inhaler Commonly known as:  PROVENTIL HFA;VENTOLIN HFA Inhale 2 puffs into the lungs every 6 (six) hours as needed for wheezing or shortness of breath.   aspirin EC 81 MG tablet Take 1 tablet (81 mg total) by mouth daily.   digoxin 0.125 MG tablet Commonly known as:  LANOXIN Take 1 tablet (0.125 mg total) by mouth daily.   furosemide 80 MG tablet Commonly known as:  LASIX Take 80 mg (one tablet) twice daily. What changed:    medication strength  additional instructions   milrinone 20 MG/100 ML Soln infusion Commonly known as:  PRIMACOR Inject 0.0163 mg/min into the vein continuous.   omeprazole 20 MG capsule Commonly known as:   PRILOSEC Take 20 mg by mouth daily.   potassium chloride SA 20 MEQ tablet Commonly known as:  K-DUR,KLOR-CON Take 2 tablets (40 mEq total) by mouth daily.   spironolactone 25 MG tablet Commonly known as:  ALDACTONE Take 0.5 tablets (12.5 mg total) by mouth daily. What changed:  how much to take            Durable Medical Equipment  (From admission, onward)         Start     Ordered   01/10/18 1639  Heart failure home health orders  (Heart failure home health orders / Face to face)  Once    Comments:  Heart Failure Follow-up Care:  Verify follow-up appointments per Patient Discharge Instructions. Confirm transportation arranged. Reconcile home medications with discharge medication list. Remove discontinued medications from use. Assist patient/caregiver to manage medications using pill box. Reinforce low sodium food selection Assessments: Vital signs and oxygen saturation at each visit. Assess home environment for safety concerns, caregiver support and availability of low-sodium foods. Consult Child psychotherapist, PT/OT, Dietitian, and CNA based on assessments. Perform comprehensive cardiopulmonary assessment. Notify MD for any change in condition or weight gain of 3 pounds in one day or 5 pounds in one week with symptoms. Daily Weights and Symptom Monitoring: Ensure patient has access to scales. Teach patient/caregiver to weigh daily before breakfast and after voiding using same scale and record.    Teach patient/caregiver to track weight and symptoms and when to notify Provider. Activity: Develop individualized activity plan with patient/caregiver.   Home Paraenteral Inotropic Therapy : Data Collection Form  Patients name: Carl Ferguson   Date: 01/10/18  Information below may not be completed by the supplier nor anyone in a Financial relationship with the supplier.  1. Results of invasive hemodynamic monitoring  Cardiac Index  Before Inotrope infusion:    1.6            On Inotrope infusion:            1.9          Drug and dose:   Milrinone 0.25 mcg  2. Cardiac medications immediately prior to inotrope infusion (List name, dose, and frequency)  Lasix  3. Dose this represent maximum tolerated doses of these medications? Yes.   4. Breathing status Prior to inotrope infusion: Dyspnea at rest yes  At time of discharge: Dyspnea on moderate  exertion.  5. Initial home prescription Drug and Dose:   0.25 mcg  for continuous infusion 24/hr day and 7 days/week  6. If continuous infusion is prescribed, have attempts to discontinue inotrope infusion in the hospital failed?   Yes.   7. If intermittent infusion is prescribed, have there been repeated hospitalizations for heart failure which Parenteral inotrope were required? Not applicable.   8. Is patient capable of going to the physician for outpatient evaluation? Yes.   9. Is routine electrocardiographic monitoring required in the Home?  No.   The above statements and any additional explanations included separately are true and accurate and there is documentation present in the patients medical record to support these statements.   Completed by Tonye Becket, NP   In instances where this form was completed by an Advanced [facility] Provider, please see EMR for physician Co-Signature.  [facility] to provide  Labs every other week to include BMET, Mg, and CBC with Diff. Additional as needed. Should be drawn via PERIPHERAL stick. NOT PICC line.   [id number] Milrinone 0.25 mcg/kg/min X 52 weeks A4221 Supplies for maintenance of drug infusion catheter A4222 Supplies for the external drug infusion per cassette or bag E0781 Ambulatory Infusion pump  Question Answer Comment  Heart Failure Follow-up Care [facility] at [phone]   Obtain the following labs Basic Metabolic Panel   Lab frequency Weekly   Fax lab results to [facility] at [phone]   Diet Low Sodium Heart Healthy   Fluid  restrictions: 2000 mL Fluid   Initiate Heart Failure Clinic Diuretic Protocol to be used by [facility] only ( to be ordered by Heart Failure Team Providers Only) No      01/10/18 1641            Outstanding Labs/Studies   Needs BMP next week  Duration of Discharge Encounter   Greater than 30 minutes including physician time.  Signed, Roe Rutherford Antavion Bartoszek, PA 01/11/2018, 10:41 AM

## 2018-01-11 NOTE — Progress Notes (Signed)
On-call Physician returned call and no ordered were given.

## 2018-01-11 NOTE — Care Management (Signed)
[facility], Jermaine, aware of d/c today.

## 2018-01-11 NOTE — Progress Notes (Signed)
Was notified by central telemetry that patient had 9 beats of V-TAC. Patient remained asymptomatic.  Notified on-call MD via paging services. Awaiting a response.

## 2018-01-13 ENCOUNTER — Telehealth (HOSPITAL_COMMUNITY): Payer: Self-pay | Admitting: Licensed Clinical Social Worker

## 2018-01-13 ENCOUNTER — Other Ambulatory Visit (HOSPITAL_COMMUNITY): Payer: Self-pay | Admitting: Dentistry

## 2018-01-13 MED FILL — SPIRONOLACTONE 25 MG TABLET: 25 | 30 days supply | Qty: 15 | Fill #0

## 2018-01-13 MED FILL — FUROSEMIDE 80 MG TABLET: 80 | 30 days supply | Qty: 60 | Fill #0

## 2018-01-13 NOTE — Telephone Encounter (Signed)
CSW spoke with pt Disability Determination case worker, Arley Phenix, regarding SSI status.  Aisha request DC summary from hospital stay be sent and states they will need notes from a follow- up appointment prior to sending to MD for review  DC summary sent  CSW will continue to follow pt in clinic and assist as needed  Burna Sis, LCSW Clinical Social Worker [phone]

## 2018-01-14 ENCOUNTER — Ambulatory Visit (INDEPENDENT_AMBULATORY_CARE_PROVIDER_SITE_OTHER): Payer: Medicaid Other | Admitting: *Deleted

## 2018-01-14 DIAGNOSIS — I420 Dilated cardiomyopathy: Secondary | ICD-10-CM | POA: Diagnosis not present

## 2018-01-14 DIAGNOSIS — I472 Ventricular tachycardia: Secondary | ICD-10-CM | POA: Diagnosis not present

## 2018-01-14 DIAGNOSIS — I4729 Other ventricular tachycardia: Secondary | ICD-10-CM

## 2018-01-14 LAB — CUP PACEART INCLINIC DEVICE CHECK
Brady Statistic AP VP Percent: 0.01 %
Brady Statistic AP VS Percent: 0 %
Brady Statistic AS VP Percent: 98.98 %
Brady Statistic AS VS Percent: 1 %
Brady Statistic RA Percent Paced: 0.02 %
Brady Statistic RV Percent Paced: 98.52 %
Date Time Interrogation Session: 20191001104005
HighPow Impedance: 53 Ohm
Implantable Lead Implant Date: 20190919
Implantable Lead Location: 753859
Implantable Lead Location: 753860
Implantable Lead Model: 6935
Lead Channel Impedance Value: 228 Ohm
Lead Channel Impedance Value: 304 Ohm
Lead Channel Pacing Threshold Amplitude: 0.75 V
Lead Channel Pacing Threshold Amplitude: 0.75 V
Lead Channel Pacing Threshold Pulse Width: 0.4 ms
Lead Channel Pacing Threshold Pulse Width: 0.4 ms
Lead Channel Sensing Intrinsic Amplitude: 5 mV
Lead Channel Setting Pacing Amplitude: 3.5 V
Lead Channel Setting Pacing Amplitude: 3.5 V
Lead Channel Setting Pacing Amplitude: 5 V
Lead Channel Setting Pacing Pulse Width: 1 ms
MDC IDC LEAD IMPLANT DT: 20190919
MDC IDC LEAD IMPLANT DT: 20190919
MDC IDC LEAD LOCATION: 753860
MDC IDC MSMT BATTERY REMAINING LONGEVITY: 19 mo
MDC IDC MSMT BATTERY VOLTAGE: 2.97 V
MDC IDC MSMT LEADCHNL LV IMPEDANCE VALUE: 342 Ohm
MDC IDC MSMT LEADCHNL LV IMPEDANCE VALUE: 532 Ohm
MDC IDC MSMT LEADCHNL RA IMPEDANCE VALUE: 418 Ohm
MDC IDC MSMT LEADCHNL RA SENSING INTR AMPL: 4.25 mV
MDC IDC MSMT LEADCHNL RV IMPEDANCE VALUE: 247 Ohm
MDC IDC MSMT LEADCHNL RV PACING THRESHOLD AMPLITUDE: 2.75 V
MDC IDC MSMT LEADCHNL RV PACING THRESHOLD PULSEWIDTH: 1 ms
MDC IDC PG IMPLANT DT: 20190919
MDC IDC SET LEADCHNL LV PACING PULSEWIDTH: 0.4 ms
MDC IDC SET LEADCHNL RV SENSING SENSITIVITY: 0.3 mV

## 2018-01-14 SURGERY — INSERTION OF IMPLANTABLE LEFT VENTRICULAR ASSIST DEVICE
Anesthesia: General | Site: Chest

## 2018-01-14 NOTE — Progress Notes (Signed)
Wound check appointment. Steri-strips removed. Wound without redness or edema. Incision edges approximated, wound well healed. Patient educated on wound care and signs/symptoms of infection.   RA and LV (HIS- septal per GT implant note) Thresholds, sensing, and impedances consistent with implant measurements. RV threshold elevated at 2.75V @ 1.94ms from 0.9V @ 0.22ms at implant; pacing impedance decreased to 247 ohms from 505 ohms at implant; defib impedance to 53 ohms from 63 at implant; R waves decreased to 5 mV from 12 mV at implant-- reviewed with Dr. Ladona Ridgel, no changes at this time. Device programmed at 3.5V (RA and LV) and 5.0V (RV) for extra safety margin until 3 month visit. Histogram distribution appropriate for patient and level of activity.   No mode switches. 1 VT episode treated with ATP, duration 14 seconds, Avg V rate 273 bpm, patient states asymptomatic with episode-- reviewed with GT, no changes made at this time. Patient educated about wound care, arm mobility, lifting restrictions, shock plan. ROV with GT 04/14/18.

## 2018-01-15 ENCOUNTER — Ambulatory Visit: Payer: Medicaid Other | Admitting: Physician Assistant

## 2018-01-15 ENCOUNTER — Encounter: Payer: Self-pay | Admitting: Physician Assistant

## 2018-01-15 VITALS — BP 105/76 | HR 111 | Temp 98.1°F

## 2018-01-15 DIAGNOSIS — I4729 Other ventricular tachycardia: Secondary | ICD-10-CM

## 2018-01-15 DIAGNOSIS — Z720 Tobacco use: Secondary | ICD-10-CM

## 2018-01-15 DIAGNOSIS — I472 Ventricular tachycardia: Secondary | ICD-10-CM

## 2018-01-15 DIAGNOSIS — I428 Other cardiomyopathies: Secondary | ICD-10-CM

## 2018-01-15 DIAGNOSIS — Z95810 Presence of automatic (implantable) cardiac defibrillator: Secondary | ICD-10-CM

## 2018-01-15 DIAGNOSIS — Z7689 Persons encountering health services in other specified circumstances: Secondary | ICD-10-CM

## 2018-01-15 DIAGNOSIS — I5022 Chronic systolic (congestive) heart failure: Secondary | ICD-10-CM

## 2018-01-15 DIAGNOSIS — I10 Essential (primary) hypertension: Secondary | ICD-10-CM

## 2018-01-15 DIAGNOSIS — I420 Dilated cardiomyopathy: Secondary | ICD-10-CM

## 2018-01-15 DIAGNOSIS — L709 Acne, unspecified: Secondary | ICD-10-CM

## 2018-01-15 NOTE — Pre-Procedure Instructions (Signed)
HOYLE MONTERO  01/15/2018      [facility] - [place], [place] - [address] [facility]. [address] [place] [address] Phone: [phone] Fax: [phone]    Your procedure is scheduled on Monday October 7th.  Report to [facility] at [employer] A.M.  Call this number if you have problems the morning of surgery:  [phone]   Remember:  Do not eat or drink after midnight.    Take these medicines the morning of surgery with A SIP OF WATER   Albuterol (if needed) Bring inhaler with you  Digoxin  Prilosec  7 days prior to surgery STOP taking any Aspirin(unless otherwise instructed by your surgeon), Aleve, Naproxen, Ibuprofen, Motrin, Advil, Goody's, BC's, all herbal medications, fish oil, and all vitamins     Do not wear jewelry.  Do not wear lotions, powders, or colognes, or deodorant.  Do not shave 48 hours prior to surgery.  Men may shave face and neck.  Do not bring valuables to the hospital.  [facility] is not responsible for any belongings or valuables.  Contacts, dentures or bridgework may not be worn into surgery.  Leave your suitcase in the car.  After surgery it may be brought to your room.  For patients admitted to the hospital, discharge time will be determined by your treatment team.  Patients discharged the day of surgery will not be allowed to drive home.    [facility]- Preparing For Surgery  Before surgery, you can play an important role. Because skin is not sterile, your skin needs to be as free of germs as possible. You can reduce the number of germs on your skin by washing with CHG (chlorahexidine gluconate) Soap before surgery.  CHG is an antiseptic cleaner which kills germs and bonds with the skin to continue killing germs even after washing.    Oral Hygiene is also important to reduce your risk of infection.  Remember - BRUSH YOUR TEETH THE MORNING OF SURGERY WITH YOUR REGULAR TOOTHPASTE  Please  do not use if you have an allergy to CHG or antibacterial soaps. If your skin becomes reddened/irritated stop using the CHG.  Do not shave (including legs and underarms) for at least 48 hours prior to first CHG shower. It is OK to shave your face.  Please follow these instructions carefully.   1. Shower the NIGHT BEFORE SURGERY and the MORNING OF SURGERY with CHG.   2. If you chose to wash your hair, wash your hair first as usual with your normal shampoo.  3. After you shampoo, rinse your hair and body thoroughly to remove the shampoo.  4. Use CHG as you would any other liquid soap. You can apply CHG directly to the skin and wash gently with a scrungie or a clean washcloth.   5. Apply the CHG Soap to your body ONLY FROM THE NECK DOWN.  Do not use on open wounds or open sores. Avoid contact with your eyes, ears, mouth and genitals (private parts). Wash Face and genitals (private parts)  with your normal soap.  6. Wash thoroughly, paying special attention to the area where your surgery will be performed.  7. Thoroughly rinse your body with warm water from the neck down.  8. DO NOT shower/wash with your normal soap after using and rinsing off the CHG Soap.  9. Pat yourself dry with a CLEAN TOWEL.  10. Wear CLEAN PAJAMAS to bed the night before surgery,  wear comfortable clothes the morning of surgery  11. Place CLEAN SHEETS on your bed the night of your first shower and DO NOT SLEEP WITH PETS.    Day of Surgery:  Do not apply any deodorants/lotions.  Please wear clean clothes to the hospital/surgery center.   Remember to brush your teeth WITH YOUR REGULAR TOOTHPASTE.    Please read over the following fact sheets that you were given.
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
## 2018-01-16 ENCOUNTER — Other Ambulatory Visit: Payer: Self-pay

## 2018-01-16 ENCOUNTER — Ambulatory Visit (HOSPITAL_COMMUNITY): Admit: 2018-01-16 | Payer: Medicaid Other | Admitting: Internal Medicine

## 2018-01-16 ENCOUNTER — Encounter (HOSPITAL_COMMUNITY): Payer: Self-pay

## 2018-01-16 ENCOUNTER — Encounter (HOSPITAL_COMMUNITY)
Admission: RE | Admit: 2018-01-16 | Discharge: 2018-01-16 | Disposition: A | Discharge status: Home / Self Care | Payer: Medicaid Other | Source: Ambulatory Visit | Attending: Dentistry | Admitting: Dentistry

## 2018-01-16 ENCOUNTER — Ambulatory Visit (HOSPITAL_COMMUNITY)
Admission: RE | Admit: 2018-01-16 | Discharge: 2018-01-16 | Disposition: A | Discharge status: Home / Self Care | Payer: Medicaid Other | Source: Ambulatory Visit | Attending: Cardiology | Admitting: Cardiology

## 2018-01-16 VITALS — BP 99/77 | HR 119 | Ht 67.0 in | Wt 144.2 lb

## 2018-01-16 DIAGNOSIS — I5021 Acute systolic (congestive) heart failure: Secondary | ICD-10-CM | POA: Diagnosis not present

## 2018-01-16 DIAGNOSIS — I428 Other cardiomyopathies: Secondary | ICD-10-CM | POA: Diagnosis not present

## 2018-01-16 DIAGNOSIS — I340 Nonrheumatic mitral (valve) insufficiency: Secondary | ICD-10-CM | POA: Diagnosis not present

## 2018-01-16 DIAGNOSIS — I472 Ventricular tachycardia, unspecified: Secondary | ICD-10-CM

## 2018-01-16 DIAGNOSIS — Z95810 Presence of automatic (implantable) cardiac defibrillator: Secondary | ICD-10-CM | POA: Diagnosis not present

## 2018-01-16 DIAGNOSIS — I447 Left bundle-branch block, unspecified: Secondary | ICD-10-CM | POA: Diagnosis not present

## 2018-01-16 DIAGNOSIS — Z87891 Personal history of nicotine dependence: Secondary | ICD-10-CM | POA: Diagnosis not present

## 2018-01-16 DIAGNOSIS — I5022 Chronic systolic (congestive) heart failure: Secondary | ICD-10-CM | POA: Diagnosis not present

## 2018-01-16 DIAGNOSIS — F1011 Alcohol abuse, in remission: Secondary | ICD-10-CM | POA: Insufficient documentation

## 2018-01-16 DIAGNOSIS — I4729 Other ventricular tachycardia: Secondary | ICD-10-CM

## 2018-01-16 HISTORY — DX: Major depressive disorder, single episode, unspecified: F32.9

## 2018-01-16 HISTORY — DX: Depression, unspecified: F32.A

## 2018-01-16 HISTORY — DX: Presence of automatic (implantable) cardiac defibrillator: Z95.810

## 2018-01-16 HISTORY — DX: Pneumonia, unspecified organism: J18.9

## 2018-01-16 LAB — DIGOXIN LEVEL: DIGOXIN LVL: 0.5 ng/mL — AB (ref 0.8–2.0)

## 2018-01-16 SURGERY — BIV ICD INSERTION CRT-D

## 2018-01-16 MED ORDER — AMIODARONE HCL 200 MG PO TABS: 200.0000 mg | ORAL_TABLET | Freq: Two times a day (BID) | ORAL | 6 refills | Status: DC

## 2018-01-16 MED FILL — AMIODARONE HCL 200 MG TAB: 200 | 34 days supply | Qty: 68 | Fill #0

## 2018-01-16 NOTE — Progress Notes (Signed)
CSW met with patient in the clinic. Patient informed of pending statis with DDS and need for additional information from today's visit. Patient agreeable and appreciative of assistance. CSW also discussed SDOH and completed partial assessment. Patient denies any concerns with transportation as his family assists with transport. He also denies any food insecurity as he has food stamps. He did state that it is very hard to manage finances as he currently has no income. He resides with his girlfriend whose limited income covers basic expenses. Patient reports plans to move to a new home with lower financial costs within the next few weeks. Patient has a pending Disability application which with approval will help relieve the financial strain. CSW continues to follow for support and upcoming LVAD implant. Patient appears very positive and motivated for improved health and wellness. Raquel Sarna, [place], [place]

## 2018-01-16 NOTE — Progress Notes (Signed)
PCP - Jacquelin Hawking PA Cardiologist - Dionisio Paschal MD ICD - Lewayne Bunting   Chest x-ray - 01/06/18 EKG - 01/06/18 Stress Test - 06/2017 ECHO - 12/2017 Cardiac Cath - 12/2017  Blood Thinner Instructions: N/A Aspirin Instructions: will ask surgeon at appointment today as to when to stop taking.   Anesthesia review: Cardiac Clearance. ICD orders  Patient denies shortness of breath, fever, cough and chest pain at PAT appointment   Patient verbalized understanding of instructions that were given to them at the PAT appointment. Patient was also instructed that they will need to review over the PAT instructions again at home before surgery.

## 2018-01-16 NOTE — Progress Notes (Signed)
Carl Ferguson with [employer] emailed with info on pt's upcoming surgeries. ICD form faxed.

## 2018-01-16 NOTE — Progress Notes (Signed)
Patient presents to VAD clinic for hospital discharge f/u visit. He had pre-op visit this am for upcoming dental extractions per Dr. Kristin Bruins scheduled on 01/20/18.   Pt currently on Milrinone 0.25 mcg/kg/min and says he feels much better since discharge. He admits to "no doing much" but denies SOB, orthopnea, PND, or any pedal edema.  Wound check on 01/14/18 at Device clinic with device interrogation revealed 14 sec episode of V tach with average rate of 273 bpm terminated with ATP.   Due to elevated heart rate today - EKG obtained and reviewed by Dr. Shirlee Latch. Due to pro-arrhythmic properties of Milrinone, Dr. Shirlee Latch will start Amiodarone 200 mg twice daily.  With upcoming dental extractions and VAD implant, Dr. Shirlee Latch asked pt to stop daily aspirin.  Pt was under the impression VAD implant was going to be scheduled 01/27/18 - discussed scheduling options with Dr. Shirlee Latch and Dr. Maren Beach. Pt will present for RHC on Friday 01/24/18 with admission to follow. Planned HM III implant 01/27/18.    Vital Signs:  Temp: 98.2 oral Automatc BP: 99/77 (89) HR:  119 SPO2: 97 % on RA   Weight: 144.2 lbs D/C weight: 143 lbs Home weights: pt does not weight; says he has no scales; provided with scales for home use  Patient Instuctions: 1. Stop Aspirin 2. Start Amiodarone 200 mg twice daily - Rx sent to [facility] OP pharamcy 3. Will have teeth extractions per Dr. Kristin Bruins on Monday, 01/20/18 and go home after procedure. 4. Will present to Admissions at 5:30 am on Friday, 01/24/18 for right heart cath and be admitted afterward. Will remain NPO after midnight that day and take meds with small sip of water that am.  5. Planned admission following RHC on 01/24/18 and VAD implant scheduled Monday, 01/27/18 per Dr. Maren Beach.

## 2018-01-16 NOTE — Progress Notes (Signed)
Advanced Heart Failure Clinic Note   PCP: Jacquelin Hawking, PA-C  PCP-Cardiologist: Garwin Brothers, MD  HF Cardiology: Dr. Shirlee Latch  HPI:  Carl Ferguson is a 52 y.o. male with systolic CHF, NICM, Chronic LBBB, Tobacco abuse, prior ETOH abuse, prior substance abuse, and h/o of device placements with extractions due to infection.   Pt has long complicated history of CHF.  Care was initially in [place] and at [facility].  Previously had EF down to 30% in 2011 at initial diagnosis.  In 4/13, he got a single chamber ICD.  In 9/13, he had implantation of a subcutaneous array for high DFTs while on amiodarone for VT.  Echo in 11/15 with EF 30%.  In 11/15, single chamber ICD, leads, and subcutaneous array all removed due to infection.  Discharged with [facility]. In 1/16, he had subcutaneous ICD placed.  In 9/16, this was explanted due to erosion.  EF on echo in 9/16 was 45%.  Echo in 11/17 showed EF 50%.    Admitted to [facility] 2/28 - 06/17/17 with SOB, orthopnea, and PND. He was off medicines for about 6 weeks prior due to insurance issues. Repeat Echo (3/19) significant for fall in EF to 15-20% with severe LV dilation and severe diffuse hypokinesis, moderate-severe MR.   Followed up with Dr. Tomie China 06/24/17. Feeling better at this appointment. Referred for LHC and HF team. Dr Excell Seltzer performed [facility] March 2019. No significant CAD.  CPX 3/19 showed peak VO2 24 (68% predicted) with VE/VCO2 slope 37 with RER 0.91 (submaximal).  Probably mild to moderate functional impairment.   Echo 9/19 showed EF around 20% with severe dilation, severe central mitral regurgitation. Severe LAE.   RHC was done in 9/19 showed markedly low cardiac output, CI 1.4.  He was admitted to start on milrinone 0.25.  He did well on this dose with rise in cardiac output and resolution of low output-type symptoms.  Given wide LBBB, Dr. Ladona Ridgel attempted placement of CRT-D.  However, unable to place LV lead.  He had ICD placed with His lead, but QRS  did not narrow.  We were unable to wean him off milrinone.  He developed a fever, thought to be possible PICC infection as it improved after removal of PICC.  TEE confirmed severe functional MR, not thought to be Mitraclip candidate given LV dilated > 7 cm.    He returns today for CHF followup.  He is on home milrinone 0.25.  He feels good, no significant exertional dyspnea though he has not been very active.  No lightheadedness.  No fever.  No orthopnea/PND.  Of note, he had 1 episode of VT treated and resolved with ATP.  Since going home, he has not smoked and has not had any ETOH.   ECG (8/19): NSR, LBBB QRS 164 msec  Labs (9/19): K 4.7 => 3.6, creatinine 1.24 => 0.93  PMH: 1. Active smoker.  2. Chronic systolic CHF: Nonischemic cardiomyopathy, diagnosed in 2011. HIV negative.  History of drug/ETOH abuse, now stopped.  - Echo 2011: EF 30%.  - In 4/13, he got a single chamber ICD.  In 9/13, he had implantation of a subcutaneous array for high DFTs while on amiodarone for VT.  - Echo 11/15: EF 30%.  - In 11/15, single chamber ICD, leads, and subcutaneous array all removed due to infection. - In 1/16, he had subcutaneous ICD placed.  In 9/16, this was explanted due to erosion. - Echo 9/16: EF 45% - Echo 11/17: EF 50% -  Echo (3/19): EF 15-20% with severe LV dilation and severe diffuse hypokinesis, moderate-severe MR.  - LHC (3/19): No significant coronary disease.  - CPX (3/19): Peak VO2 24 (68% predicted) with VE/VCO2 slope 37 with RER 0.91 (submaximal).  Probably mild to moderate functional impairment.  - Echo (9/19):  EF is around 20% with severe dilation, severe central mitral regurgitation. Severe LAE. - RHC (9/19): mean RA 3, PA 41/20 mean 30, mean PCWP 16, CI 1.4, PVR 5.6 WU.  - TEE (9/19): Severe LV dilation > 7 cm, EF 15-20%, dyssynchrony noted, mildly decreased RV systolic function, severe functional MR.  - Medtronic ICD with His bundle pacing 9/19, unable to place LV lead.  3.  LBBB 3. Mitral regurgitation: Severe on 9/19 echo. Suspect primarily functional.   Review of systems complete and found to be negative unless listed in HPI.    Social History   Socioeconomic History  . Marital status: Single    Spouse name: Not on file  . Number of children: Not on file  . Years of education: Not on file  . Highest education level: Not on file  Occupational History  . Not on file  Social Needs  . Financial resource strain: Very hard  . Food insecurity:    Worry: Never true    Inability: Never true  . Transportation needs:    Medical: No    Non-medical: No  Tobacco Use  . Smoking status: Former Smoker    Packs/day: 0.50    Years: 33.00    Pack years: 16.50    Types: Cigarettes    Last attempt to quit: 12/29/2017    Years since quitting: 0.0  . Smokeless tobacco: Never Used  Substance and Sexual Activity  . Alcohol use: Yes    Frequency: Never    Comment: 3-4 beers daily.   . Drug use: No  . Sexual activity: Not on file  Lifestyle  . Physical activity:    Days per week: Not on file    Minutes per session: Not on file  . Stress: Not on file  Relationships  . Social connections:    Talks on phone: Not on file    Gets together: Not on file    Attends religious service: Not on file    Active member of club or organization: Not on file    Attends meetings of clubs or organizations: Not on file    Relationship status: Not on file  . Intimate partner violence:    Fear of current or ex partner: Not on file    Emotionally abused: Not on file    Physically abused: Not on file    Forced sexual activity: Not on file  Other Topics Concern  . Not on file  Social History Narrative  . Not on file   Family History  Problem Relation Age of Onset  . Heart attack Mother   . Sudden Cardiac Death Neg Hx    Allergies  Allergen Reactions  . Norflex [Orphenadrine] Swelling  . Orphenadrine Citrate Swelling and Other (See Comments)    Face swelling     Current Outpatient Medications  Medication Sig Dispense Refill  . acetaminophen (TYLENOL) 500 MG tablet Take 500 mg by mouth every 6 (six) hours as needed.    Marland Kitchen aspirin EC 81 MG tablet Take 1 tablet (81 mg total) by mouth daily. 30 tablet 2  . digoxin (LANOXIN) 0.125 MG tablet Take 1 tablet (0.125 mg total) by mouth daily. 30 tablet 2  .  diphenhydramine-acetaminophen (TYLENOL PM EXTRA STRENGTH) 25-500 MG TABS tablet Take 2 tablets by mouth at bedtime as needed (for sleep).    . furosemide (LASIX) 80 MG tablet Take 80 mg (one tablet) twice daily. (Patient taking differently: Take 80 mg by mouth 2 (two) times daily. ) 120 tablet 3  . milrinone (PRIMACOR) 20 MG/100 ML SOLN infusion Inject 0.0163 mg/min into the vein continuous. 100 mL 6  . nicotine (NICODERM CQ - DOSED IN MG/24 HOURS) 14 mg/24hr patch Place 14 mg onto the skin daily.    Marland Kitchen omeprazole (PRILOSEC) 20 MG capsule Take 20 mg by mouth daily.    . potassium chloride SA (K-DUR,KLOR-CON) 20 MEQ tablet Take 2 tablets (40 mEq total) by mouth daily. 60 tablet 2  . spironolactone (ALDACTONE) 25 MG tablet Take 0.5 tablets (12.5 mg total) by mouth daily. 30 tablet 2  . albuterol (PROVENTIL HFA;VENTOLIN HFA) 108 (90 Base) MCG/ACT inhaler Inhale 2 puffs into the lungs every 6 (six) hours as needed for wheezing or shortness of breath.    Marland Kitchen amiodarone (PACERONE) 200 MG tablet Take 1 tablet (200 mg total) by mouth 2 (two) times daily. 60 tablet 6   No current facility-administered medications for this encounter.     Vitals:   01/16/18 0928  BP: 99/77  Pulse: (!) 119  SpO2: 97%  Weight: 65.4 kg (144 lb 3.2 oz)  Height: 5\' 7"  (1.702 m)     Wt Readings from Last 3 Encounters:  01/16/18 65.4 kg (144 lb 3.2 oz)  01/16/18 65.2 kg (143 lb 12.8 oz)  01/11/18 65.2 kg (143 lb 11.8 oz)    General: NAD Neck: No JVD, no thyromegaly or thyroid nodule.  Lungs: Clear to auscultation bilaterally with normal respiratory effort. CV: Lateral PMI.  Heart  mildly tachy, regular S1/S2, +S3, 2/6 HSM apex.  No peripheral edema.  Abdomen: Soft, nontender, no hepatosplenomegaly, no distention.  Skin: Intact without lesions or rashes.  Neurologic: Alert and oriented x 3.  Psych: Normal affect. Extremities: No clubbing or cyanosis.  HEENT: Normal.   ASSESSMENT & PLAN:  1. Chronic Systolic CHF:  Nonischemic cardiomyopathy, diagnosed in 2011, initially followed at [place].  LHC (3/19) with no significant CAD.  CPX (3/19) submaximal, but suggestive of mild to moderate HF limitation. HIV negative 3/19, history of drug/ETOH abuse in the past, no drugs now and had cut back ETOH considerably.  Echo 9/19 showed EF 20% with severe LV dilation, severe MR.  NYHA class IIIb symptoms. He has a wide LBBB.  RHC was done in 9/19 showing low output, CI 1.4.  He was admitted to start milrinone, unable to wean off milrinone and remains on 0.25 mcg/kg/min at home.  Medtronic ICD was placed, he has a His bundle lead but EP was unable to get an LV lead in place => His bundle pacing does not narrow his QRS and did not help [place] wean him off milrinone.  He is doing well on milrinone, NYHA class II now.  Volume status looks ok on Lasix 80 mg po bid.  - Continue digoxin, check level today.  - Continue home milrinone 0.25 mcg/kg/min.  - Continue spironolactone 12.5 daily.  - Unable to place LV lead as above.  - Not thought to be Mitraclip candidate (see below).  - At this time, his best option for long-term survival will be LVAD placement.  We have discussed this at length with patient and girlfriend. Current plan will be to continue milrinone at home until he  has LVAD placed.  He will have tooth removals next week with Dr. Kristin Bruins.  We will admit him on 10/11 for RHC/[place], then will plan LVAD placement on 10/14.  - He may be an eventual transplant candidate if he stays off cigarettes.  2. ETOH Abuse: No longer drinking.  3. Tobacco abuse: He has quit smoking since admission in  9/19.  4. Mitral regurgitation: Severe functional MR.  With LV dilated > 7 cm, suspect Mitraclip would be unlikely to help him appreciably.   5. VT: Patient had episode of VT requiring ATP.  Will start amiodarone 200 mg bid.  6. We are working on getting his Medicaid coverage and disability.   Marca Ancona 01/16/2018

## 2018-01-16 NOTE — Progress Notes (Signed)
Anesthesia Chart Review:  Case:  [id number] Date/Time:  01/20/18 0715   Procedure:  MULTIPLE EXTRACTION WITH ALVEOLOPLASTY AND GROSS DEBRIDEMENT OF REMAINING TEETH (N/A )   Anesthesia type:  General   Pre-op diagnosis:  heart failure and chronic periodontitis   Location:  [facility] ROOM 03 / [facility]   Surgeon:  Charlynne Pander, DDS      DISCUSSION: 52 yo male former smoker. Pertinent hx includes Nonischemic cardiomyopathy (EF 15-20% by TEE 12/2017), LBBB, Medtronic ICD, Severe functional MR.  See Dr. Alford Highland note from 01/16/2018 for comprehensive review of pt's cardiovascular history.  Device form has been faxed.  Anticipate he can proceed with surgery as planned barring acte status change.  VS: BP 100/73   Pulse (!) 108 Comment: taken manually/notified [place]  Temp 36.8 C   Resp 18   Ht 5\' 7"  (1.702 m)   Wt 65.2 kg   SpO2 97%   BMI 22.52 kg/m   PROVIDERS: Jacquelin Hawking, PA-C is PCP  Belva Crome, MD is Cardiologist  Marca Ancona, MD is HF Cardiologist  LABS: Per lab pt's blood hemolyzed. However, pt had labs 01/11/2018 that are acceptable for surgery and within 2 weeks of surgery date. Does not need repeat labs per anesthesia protocol. Results for Carl Ferguson, Carl Ferguson (MRN [id number]) as of 01/16/2018 14:53  Ref. Range 01/11/2018 04:08  BASIC METABOLIC PANEL Unknown Rpt (A)  Sodium Latest Ref Range: 135 - 145 mmol/L 135  Potassium Latest Ref Range: 3.5 - 5.1 mmol/L 3.6  Chloride Latest Ref Range: 98 - 111 mmol/L 99  CO2 Latest Ref Range: 22 - 32 mmol/L 28  Glucose Latest Ref Range: 70 - 99 mg/dL 371 (H)  BUN Latest Ref Range: 6 - 20 mg/dL 17  Creatinine Latest Ref Range: 0.61 - 1.24 mg/dL 0.62  Calcium Latest Ref Range: 8.9 - 10.3 mg/dL 9.0  Anion gap Latest Ref Range: 5 - 15  8  GFR, Est Non African American Latest Ref Range: >60 mL/min >60  GFR, Est African American Latest Ref Range: >60 mL/min >60  WBC Latest Ref Range: 4.0 - 10.5 K/uL 8.4  RBC Latest Ref Range:  4.22 - 5.81 MIL/uL 4.28  Hemoglobin Latest Ref Range: 13.0 - 17.0 g/dL 69.4  HCT Latest Ref Range: 39.0 - 52.0 % 40.2  MCV Latest Ref Range: 78.0 - 100.0 fL 93.9  MCH Latest Ref Range: 26.0 - 34.0 pg 32.9  MCHC Latest Ref Range: 30.0 - 36.0 g/dL 85.4  RDW Latest Ref Range: 11.5 - 15.5 % 12.7  Platelets Latest Ref Range: 150 - 400 K/uL 231  Neutrophils Latest Units: % 59  Lymphocytes Latest Units: % 25  Monocytes Relative Latest Units: % 10  Eosinophil Latest Units: % 5  Basophil Latest Units: % 1  NEUT# Latest Ref Range: 1.7 - 7.7 K/uL 4.9  Lymphocyte # Latest Ref Range: 0.7 - 4.0 K/uL 2.1  Monocyte # Latest Ref Range: 0.1 - 1.0 K/uL 0.9  Eosinophils Absolute Latest Ref Range: 0.0 - 0.7 K/uL 0.4  Basophils Absolute Latest Ref Range: 0.0 - 0.1 K/uL 0.1  Immature Granulocytes Latest Units: % 0  Abs Immature Granulocytes Latest Ref Range: 0.0 - 0.1 K/uL 0.0   Labs Reviewed - No data to display   IMAGES:   EKG:   CV:  Past Medical History:  Diagnosis Date  . AICD (automatic cardioverter/defibrillator) present   . CHF (congestive heart failure) (HCC)   . COPD (chronic obstructive pulmonary disease) (HCC)   .  Depression   . Dyspnea   . GERD (gastroesophageal reflux disease)   . Nonischemic cardiomyopathy (HCC)    a. diagnosed in 2011 with EF 30% at that time b. EF improved to 50% by echo in 02/2016  . Pneumonia   . Tachycardia     Past Surgical History:  Procedure Laterality Date  . BIV ICD INSERTION CRT-D N/A 01/02/2018   Procedure: BIV ICD INSERTION CRT-D;  Surgeon: Marinus Maw, MD;  Location: [facility] INVASIVE CV LAB;  Service: Cardiovascular;  Laterality: N/A;  . ICD GENERATOR REMOVAL  2017  . LEFT HEART CATH AND CORONARY ANGIOGRAPHY N/A 06/26/2017   Procedure: LEFT HEART CATH AND CORONARY ANGIOGRAPHY;  Surgeon: [name], MD;  Location: [facility] INVASIVE CV LAB;  Service: Cardiovascular;  Laterality: N/A;  . RIGHT HEART CATH N/A 12/30/2017   Procedure: RIGHT HEART  CATH;  Surgeon: Laurey Morale, MD;  Location: [facility] INVASIVE CV LAB;  Service: Cardiovascular;  Laterality: N/A;  . TEE WITHOUT CARDIOVERSION N/A 01/08/2018   Procedure: TRANSESOPHAGEAL ECHOCARDIOGRAM (TEE);  Surgeon: Laurey Morale, MD;  Location: [facility] ENDOSCOPY;  Service: Cardiovascular;  Laterality: N/A;    MEDICATIONS: . acetaminophen (TYLENOL) 500 MG tablet  . albuterol (PROVENTIL HFA;VENTOLIN HFA) 108 (90 Base) MCG/ACT inhaler  . amiodarone (PACERONE) 200 MG tablet  . aspirin EC 81 MG tablet  . digoxin (LANOXIN) 0.125 MG tablet  . diphenhydramine-acetaminophen (TYLENOL PM EXTRA STRENGTH) 25-500 MG TABS tablet  . furosemide (LASIX) 80 MG tablet  . milrinone (PRIMACOR) 20 MG/100 ML SOLN infusion  . nicotine (NICODERM CQ - DOSED IN MG/24 HOURS) 14 mg/24hr patch  . omeprazole (PRILOSEC) 20 MG capsule  . potassium chloride SA (K-DUR,KLOR-CON) 20 MEQ tablet  . spironolactone (ALDACTONE) 25 MG tablet   No current facility-administered medications for this encounter.     Zannie Cove [facility] Stay [facility]/Anesthesiology Phone [phone] 01/16/2018 3:12 PM

## 2018-01-17 ENCOUNTER — Telehealth (HOSPITAL_COMMUNITY): Payer: Self-pay | Admitting: Cardiology

## 2018-01-17 ENCOUNTER — Ambulatory Visit (HOSPITAL_COMMUNITY)
Admission: RE | Admit: 2018-01-17 | Discharge: 2018-01-17 | Disposition: A | Discharge status: Home / Self Care | Payer: Medicaid Other | Source: Ambulatory Visit | Attending: Cardiology | Admitting: Cardiology

## 2018-01-17 DIAGNOSIS — R6883 Chills (without fever): Secondary | ICD-10-CM | POA: Insufficient documentation

## 2018-01-17 DIAGNOSIS — I5022 Chronic systolic (congestive) heart failure: Secondary | ICD-10-CM | POA: Diagnosis not present

## 2018-01-17 NOTE — Telephone Encounter (Signed)
Huntley Dec, RN with [facility] called during patients home visit to today to report  Severe chills. + night sweats Mild nausea overnight PICC site looks great Lungs clear Temp (oral) 96.9 Nurse question side effect of milrinone.   Above discussed with Dr Shirlee Latch Per VO Dr Shirlee Latch Blood cultures, cbc RN to return for reassessment 01/18/18 ( check temp)     Huntley Dec, RN aware of above orders, however she is unable to get blood cultures, will have patient to return to office  Orders placed

## 2018-01-18 ENCOUNTER — Inpatient Hospital Stay (HOSPITAL_COMMUNITY)
Admission: EM | Admit: 2018-01-18 | Discharge: 2018-02-10 | DRG: 001 | Disposition: A | Discharge status: Home Health | Payer: Medicaid Other | Attending: Cardiology | Admitting: Cardiology

## 2018-01-18 ENCOUNTER — Encounter (HOSPITAL_COMMUNITY): Payer: Self-pay | Admitting: Oncology

## 2018-01-18 ENCOUNTER — Emergency Department (HOSPITAL_COMMUNITY): Payer: Medicaid Other

## 2018-01-18 ENCOUNTER — Other Ambulatory Visit: Payer: Self-pay

## 2018-01-18 DIAGNOSIS — Z87891 Personal history of nicotine dependence: Secondary | ICD-10-CM

## 2018-01-18 DIAGNOSIS — R402363 Coma scale, best motor response, obeys commands, at hospital admission: Secondary | ICD-10-CM | POA: Diagnosis present

## 2018-01-18 DIAGNOSIS — R197 Diarrhea, unspecified: Secondary | ICD-10-CM | POA: Diagnosis not present

## 2018-01-18 DIAGNOSIS — R5082 Postprocedural fever: Secondary | ICD-10-CM | POA: Diagnosis not present

## 2018-01-18 DIAGNOSIS — R112 Nausea with vomiting, unspecified: Secondary | ICD-10-CM | POA: Diagnosis not present

## 2018-01-18 DIAGNOSIS — E876 Hypokalemia: Secondary | ICD-10-CM | POA: Diagnosis not present

## 2018-01-18 DIAGNOSIS — K219 Gastro-esophageal reflux disease without esophagitis: Secondary | ICD-10-CM | POA: Diagnosis present

## 2018-01-18 DIAGNOSIS — Z09 Encounter for follow-up examination after completed treatment for conditions other than malignant neoplasm: Secondary | ICD-10-CM

## 2018-01-18 DIAGNOSIS — K045 Chronic apical periodontitis: Secondary | ICD-10-CM | POA: Diagnosis present

## 2018-01-18 DIAGNOSIS — Z8659 Personal history of other mental and behavioral disorders: Secondary | ICD-10-CM

## 2018-01-18 DIAGNOSIS — I472 Ventricular tachycardia: Secondary | ICD-10-CM | POA: Diagnosis present

## 2018-01-18 DIAGNOSIS — K047 Periapical abscess without sinus: Secondary | ICD-10-CM | POA: Diagnosis present

## 2018-01-18 DIAGNOSIS — Z79899 Other long term (current) drug therapy: Secondary | ICD-10-CM

## 2018-01-18 DIAGNOSIS — I447 Left bundle-branch block, unspecified: Secondary | ICD-10-CM | POA: Diagnosis present

## 2018-01-18 DIAGNOSIS — I4891 Unspecified atrial fibrillation: Secondary | ICD-10-CM | POA: Diagnosis not present

## 2018-01-18 DIAGNOSIS — I313 Pericardial effusion (noninflammatory): Secondary | ICD-10-CM | POA: Diagnosis not present

## 2018-01-18 DIAGNOSIS — Z95828 Presence of other vascular implants and grafts: Secondary | ICD-10-CM

## 2018-01-18 DIAGNOSIS — K036 Deposits [accretions] on teeth: Secondary | ICD-10-CM | POA: Diagnosis present

## 2018-01-18 DIAGNOSIS — I5089 Other heart failure: Secondary | ICD-10-CM | POA: Diagnosis not present

## 2018-01-18 DIAGNOSIS — R402143 Coma scale, eyes open, spontaneous, at hospital admission: Secondary | ICD-10-CM | POA: Diagnosis present

## 2018-01-18 DIAGNOSIS — R570 Cardiogenic shock: Secondary | ICD-10-CM | POA: Diagnosis present

## 2018-01-18 DIAGNOSIS — E861 Hypovolemia: Secondary | ICD-10-CM | POA: Diagnosis present

## 2018-01-18 DIAGNOSIS — Z95810 Presence of automatic (implantable) cardiac defibrillator: Secondary | ICD-10-CM | POA: Diagnosis present

## 2018-01-18 DIAGNOSIS — I340 Nonrheumatic mitral (valve) insufficiency: Secondary | ICD-10-CM | POA: Diagnosis present

## 2018-01-18 DIAGNOSIS — N179 Acute kidney failure, unspecified: Secondary | ICD-10-CM | POA: Diagnosis not present

## 2018-01-18 DIAGNOSIS — R509 Fever, unspecified: Secondary | ICD-10-CM | POA: Diagnosis not present

## 2018-01-18 DIAGNOSIS — I428 Other cardiomyopathies: Secondary | ICD-10-CM | POA: Diagnosis not present

## 2018-01-18 DIAGNOSIS — Z95811 Presence of heart assist device: Secondary | ICD-10-CM

## 2018-01-18 DIAGNOSIS — Z7982 Long term (current) use of aspirin: Secondary | ICD-10-CM

## 2018-01-18 DIAGNOSIS — L929 Granulomatous disorder of the skin and subcutaneous tissue, unspecified: Secondary | ICD-10-CM | POA: Diagnosis present

## 2018-01-18 DIAGNOSIS — R6889 Other general symptoms and signs: Secondary | ICD-10-CM

## 2018-01-18 DIAGNOSIS — Z888 Allergy status to other drugs, medicaments and biological substances status: Secondary | ICD-10-CM

## 2018-01-18 DIAGNOSIS — E871 Hypo-osmolality and hyponatremia: Secondary | ICD-10-CM | POA: Diagnosis not present

## 2018-01-18 DIAGNOSIS — I5022 Chronic systolic (congestive) heart failure: Secondary | ICD-10-CM | POA: Diagnosis not present

## 2018-01-18 DIAGNOSIS — F1011 Alcohol abuse, in remission: Secondary | ICD-10-CM | POA: Diagnosis present

## 2018-01-18 DIAGNOSIS — R011 Cardiac murmur, unspecified: Secondary | ICD-10-CM

## 2018-01-18 DIAGNOSIS — R402253 Coma scale, best verbal response, oriented, at hospital admission: Secondary | ICD-10-CM | POA: Diagnosis present

## 2018-01-18 DIAGNOSIS — I5043 Acute on chronic combined systolic (congestive) and diastolic (congestive) heart failure: Secondary | ICD-10-CM

## 2018-01-18 DIAGNOSIS — J449 Chronic obstructive pulmonary disease, unspecified: Secondary | ICD-10-CM | POA: Diagnosis present

## 2018-01-18 DIAGNOSIS — K029 Dental caries, unspecified: Secondary | ICD-10-CM | POA: Diagnosis present

## 2018-01-18 LAB — CBC WITH DIFFERENTIAL/PLATELET
ABS IMMATURE GRANULOCYTES: 0.1 10*3/uL (ref 0.0–0.1)
BASOS PCT: 0 %
Basophils Absolute: 0 10*3/uL (ref 0.0–0.1)
Eosinophils Absolute: 0 10*3/uL (ref 0.0–0.7)
Eosinophils Relative: 0 %
HCT: 41.4 % (ref 39.0–52.0)
HEMOGLOBIN: 15 g/dL (ref 13.0–17.0)
IMMATURE GRANULOCYTES: 0 %
LYMPHS ABS: 0.9 10*3/uL (ref 0.7–4.0)
LYMPHS PCT: 8 %
MCH: 33.4 pg (ref 26.0–34.0)
MCHC: 36.2 g/dL — ABNORMAL HIGH (ref 30.0–36.0)
MCV: 92.2 fL (ref 78.0–100.0)
Monocytes Absolute: 0.9 10*3/uL (ref 0.1–1.0)
Monocytes Relative: 8 %
NEUTROS ABS: 9.7 10*3/uL — AB (ref 1.7–7.7)
NEUTROS PCT: 84 %
PLATELETS: 175 10*3/uL (ref 150–400)
RBC: 4.49 MIL/uL (ref 4.22–5.81)
RDW: 12.8 % (ref 11.5–15.5)
WBC: 11.6 10*3/uL — ABNORMAL HIGH (ref 4.0–10.5)

## 2018-01-18 LAB — URINALYSIS, COMPLETE (UACMP) WITH MICROSCOPIC
Bacteria, UA: NONE SEEN
Bilirubin Urine: NEGATIVE
Glucose, UA: NEGATIVE mg/dL
Ketones, ur: NEGATIVE mg/dL
Leukocytes, UA: NEGATIVE
Nitrite: NEGATIVE
Protein, ur: NEGATIVE mg/dL
Specific Gravity, Urine: 1.016 (ref 1.005–1.030)
Waxy Casts, UA: 33
pH: 5 (ref 5.0–8.0)

## 2018-01-18 LAB — INFLUENZA PANEL BY PCR (TYPE A & B)
INFLAPCR: NEGATIVE
INFLBPCR: NEGATIVE

## 2018-01-18 LAB — COMPREHENSIVE METABOLIC PANEL
ALBUMIN: 3.7 g/dL (ref 3.5–5.0)
ALT: 13 U/L (ref 0–44)
ANION GAP: 15 (ref 5–15)
AST: 22 U/L (ref 15–41)
Alkaline Phosphatase: 78 U/L (ref 38–126)
BUN: 21 mg/dL — ABNORMAL HIGH (ref 6–20)
CALCIUM: 9.4 mg/dL (ref 8.9–10.3)
CHLORIDE: 96 mmol/L — AB (ref 98–111)
CO2: 19 mmol/L — AB (ref 22–32)
Creatinine, Ser: 1.37 mg/dL — ABNORMAL HIGH (ref 0.61–1.24)
GFR calc non Af Amer: 59 mL/min — ABNORMAL LOW (ref >60)
Glucose, Bld: 117 mg/dL — ABNORMAL HIGH (ref 70–99)
POTASSIUM: 3.9 mmol/L (ref 3.5–5.1)
SODIUM: 130 mmol/L — AB (ref 135–145)
Total Bilirubin: 1.4 mg/dL — ABNORMAL HIGH (ref 0.3–1.2)
Total Protein: 7.7 g/dL (ref 6.5–8.1)

## 2018-01-18 LAB — PROCALCITONIN: Procalcitonin: 9.17 ng/mL

## 2018-01-18 LAB — LACTIC ACID, PLASMA
LACTIC ACID, VENOUS: 1.4 mmol/L (ref 0.5–1.9)
Lactic Acid, Venous: 1.3 mmol/L (ref 0.5–1.9)

## 2018-01-18 LAB — C DIFFICILE QUICK SCREEN W PCR REFLEX
C Diff antigen: NEGATIVE
C Diff interpretation: NOT DETECTED
C Diff toxin: NEGATIVE

## 2018-01-18 LAB — I-STAT CG4 LACTIC ACID, ED: Lactic Acid, Venous: 1.23 mmol/L (ref 0.5–1.9)

## 2018-01-18 LAB — DIGOXIN LEVEL: DIGOXIN LVL: 0.7 ng/mL — AB (ref 0.8–2.0)

## 2018-01-18 MED ORDER — ACETAMINOPHEN 325 MG PO TABS
650.0000 mg | ORAL_TABLET | Freq: Once | ORAL | Status: AC
  Administered 2018-01-18: 650 mg via ORAL
  Filled 2018-01-18: qty 2

## 2018-01-18 MED ORDER — SODIUM CHLORIDE 0.9% FLUSH
10.0000 mL | INTRAVENOUS | Status: DC | PRN
  Administered 2018-01-25: 10 mL
  Filled 2018-01-18: qty 40

## 2018-01-18 MED ORDER — AMIODARONE HCL 200 MG PO TABS
200.0000 mg | ORAL_TABLET | Freq: Two times a day (BID) | ORAL | Status: DC
  Administered 2018-01-18 – 2018-01-26 (×18): 200 mg via ORAL
  Filled 2018-01-18 (×18): qty 1

## 2018-01-18 MED ORDER — ALBUTEROL SULFATE (2.5 MG/3ML) 0.083% IN NEBU: 3.0000 mL | INHALATION_SOLUTION | Freq: Four times a day (QID) | RESPIRATORY_TRACT | Status: DC | PRN

## 2018-01-18 MED ORDER — ONDANSETRON HCL 4 MG/2ML IJ SOLN: 4.0000 mg | Freq: Four times a day (QID) | INTRAMUSCULAR | Status: DC | PRN

## 2018-01-18 MED ORDER — SODIUM CHLORIDE 0.9% FLUSH
3.0000 mL | INTRAVENOUS | Status: DC | PRN
  Administered 2018-01-19: 3 mL via INTRAVENOUS
  Filled 2018-01-18: qty 3

## 2018-01-18 MED ORDER — ENOXAPARIN SODIUM 40 MG/0.4ML ~~LOC~~ SOLN
40.0000 mg | SUBCUTANEOUS | Status: DC
  Administered 2018-01-18 – 2018-01-19 (×2): 40 mg via SUBCUTANEOUS
  Filled 2018-01-18 (×2): qty 0.4

## 2018-01-18 MED ORDER — VANCOMYCIN HCL IN DEXTROSE 1-5 GM/200ML-% IV SOLN
1000.0000 mg | Freq: Once | INTRAVENOUS | Status: AC
  Administered 2018-01-18: 1000 mg via INTRAVENOUS
  Filled 2018-01-18: qty 200

## 2018-01-18 MED ORDER — SODIUM CHLORIDE 0.9% FLUSH
3.0000 mL | Freq: Two times a day (BID) | INTRAVENOUS | Status: DC
  Administered 2018-01-18 – 2018-01-20 (×3): 3 mL via INTRAVENOUS
  Administered 2018-01-21: 10 mL via INTRAVENOUS
  Administered 2018-01-23: 3 mL via INTRAVENOUS

## 2018-01-18 MED ORDER — PANTOPRAZOLE SODIUM 40 MG PO TBEC
40.0000 mg | DELAYED_RELEASE_TABLET | Freq: Every day | ORAL | Status: DC
  Administered 2018-01-18 – 2018-01-26 (×9): 40 mg via ORAL
  Filled 2018-01-18 (×9): qty 1

## 2018-01-18 MED ORDER — SODIUM CHLORIDE 0.9% FLUSH
10.0000 mL | Freq: Two times a day (BID) | INTRAVENOUS | Status: DC
  Administered 2018-01-18 – 2018-01-24 (×9): 10 mL

## 2018-01-18 MED ORDER — NICOTINE 14 MG/24HR TD PT24
14.0000 mg | MEDICATED_PATCH | Freq: Every day | TRANSDERMAL | Status: DC
  Administered 2018-01-18 – 2018-01-25 (×8): 14 mg via TRANSDERMAL
  Filled 2018-01-18 (×9): qty 1

## 2018-01-18 MED ORDER — SODIUM CHLORIDE 0.9 % IV SOLN: 250.0000 mL | INTRAVENOUS | Status: DC | PRN

## 2018-01-18 MED ORDER — DIGOXIN 125 MCG PO TABS
0.1250 mg | ORAL_TABLET | Freq: Every day | ORAL | Status: DC
  Administered 2018-01-18 – 2018-01-26 (×9): 0.125 mg via ORAL
  Filled 2018-01-18 (×9): qty 1

## 2018-01-18 MED ORDER — SODIUM CHLORIDE 0.9 % IV SOLN
2.0000 g | Freq: Once | INTRAVENOUS | Status: AC
  Administered 2018-01-18: 2 g via INTRAVENOUS
  Filled 2018-01-18: qty 2

## 2018-01-18 MED ORDER — SODIUM CHLORIDE 0.9 % IV SOLN
1.0000 g | Freq: Two times a day (BID) | INTRAVENOUS | Status: DC
  Administered 2018-01-18 – 2018-01-20 (×4): 1 g via INTRAVENOUS
  Filled 2018-01-18 (×5): qty 1

## 2018-01-18 MED ORDER — VANCOMYCIN HCL IN DEXTROSE 750-5 MG/150ML-% IV SOLN
750.0000 mg | Freq: Three times a day (TID) | INTRAVENOUS | Status: DC
  Administered 2018-01-18 – 2018-01-19 (×3): 750 mg via INTRAVENOUS
  Filled 2018-01-18 (×4): qty 150

## 2018-01-18 MED ORDER — ACETAMINOPHEN 325 MG PO TABS
650.0000 mg | ORAL_TABLET | ORAL | Status: DC | PRN
  Administered 2018-01-18 – 2018-01-19 (×2): 650 mg via ORAL
  Filled 2018-01-18 (×2): qty 2

## 2018-01-18 MED ORDER — METRONIDAZOLE IN NACL 5-0.79 MG/ML-% IV SOLN
500.0000 mg | Freq: Three times a day (TID) | INTRAVENOUS | Status: DC
  Administered 2018-01-18: 500 mg via INTRAVENOUS
  Filled 2018-01-18: qty 100

## 2018-01-18 MED ORDER — SODIUM CHLORIDE 0.9 % IV BOLUS
250.0000 mL | Freq: Once | INTRAVENOUS | Status: AC
  Administered 2018-01-18: 250 mL via INTRAVENOUS

## 2018-01-18 MED ORDER — MILRINONE LACTATE IN DEXTROSE 20-5 MG/100ML-% IV SOLN
0.2500 ug/kg/min | INTRAVENOUS | Status: DC
  Administered 2018-01-18 – 2018-01-26 (×10): 0.25 ug/kg/min via INTRAVENOUS
  Filled 2018-01-18 (×12): qty 100

## 2018-01-18 MED ORDER — SPIRONOLACTONE 12.5 MG HALF TABLET
12.5000 mg | ORAL_TABLET | Freq: Every day | ORAL | Status: DC
  Administered 2018-01-18 – 2018-01-26 (×9): 12.5 mg via ORAL
  Filled 2018-01-18 (×9): qty 1

## 2018-01-18 MED ORDER — ASPIRIN EC 81 MG PO TBEC
81.0000 mg | DELAYED_RELEASE_TABLET | Freq: Every day | ORAL | Status: DC
  Administered 2018-01-18 – 2018-01-26 (×6): 81 mg via ORAL
  Filled 2018-01-18 (×9): qty 1

## 2018-01-18 NOTE — ED Triage Notes (Signed)
Pt called EMS tonight for heart problems and fever. Pt reports having chills, body aches, and a fever. Recently admitted for HF and discharged on Saturday. Pt has had recent PICC line and ICD placement on last admission. Also scheduled for VAD placement 10/14. Mirinrone infusing in PICC line.

## 2018-01-18 NOTE — Progress Notes (Signed)
Pharmacy Antibiotic Note  Carl Ferguson is a 52 y.o. male admitted on 01/18/2018 with sepsis - unknown source. Pt with recent hospitalization for HF- discharged one week ago.  Pharmacy has been consulted for Vancomycin and Cefepime dosing.  Plan: Cefepime 1gm IV q12h Vancomycin 1gm now then 750mg  IV q8h Will f/u micro data, renal function, and pt's clinical condition Vanc trough prn   Height: 5\' 7"  (170.2 cm) Weight: 144 lb (65.3 kg) IBW/kg (Calculated) : 66.1  Temp (24hrs), Avg:100.1 F (37.8 C), Min:100.1 F (37.8 C), Max:100.1 F (37.8 C)  No results for input(s): WBC, CREATININE, LATICACIDVEN, VANCOTROUGH, VANCOPEAK, VANCORANDOM, GENTTROUGH, GENTPEAK, GENTRANDOM, TOBRATROUGH, TOBRAPEAK, TOBRARND, AMIKACINPEAK, AMIKACINTROU, AMIKACIN in the last 168 hours.  Estimated Creatinine Clearance: 87.8 mL/min (by C-G formula based on SCr of 0.93 mg/dL).    Allergies  Allergen Reactions  . Norflex [Orphenadrine] Swelling  . Orphenadrine Citrate Swelling and Other (See Comments)    Face swelling     Antimicrobials this admission: 10/5 Vanc >>  10/5 Cefepime >>  10/3 Flagyl >>  Microbiology results:  BCx:   UCx:     Thank you for allowing pharmacy to be a part of this patient's care.  Datavious, [place] 01/18/2018 4:19 AM

## 2018-01-18 NOTE — Progress Notes (Signed)
Patient ID: Carl Ferguson, male   DOB: 09-Feb-1966, 52 y.o.   MRN: [id number]     Advanced Heart Failure Rounding Note  PCP-Cardiologist: Garwin Brothers, MD   Subjective:    Tm to 100.1 last night, afebrile now.  WBCs mildly elevated at 11.6.  His presenting symptoms were fever/chills and headache, now resolved.  He had 2 days of nausea/vomiting/diarrhea earlier in the week but this has resolved.  CXR clear.  UA negative. Lactate normal.  BP soft, had IV fluid initially.    Objective:   Weight Range: 63.1 kg Body mass index is 21.8 kg/m.   Vital Signs:   Temp:  [98 F (36.7 C)-100.1 F (37.8 C)] 98 F (36.7 C) (10/05 0800) Pulse Rate:  [39-120] 96 (10/05 0745) Resp:  [15-30] 25 (10/05 0745) BP: (88-103)/(65-84) 96/84 (10/05 0800) SpO2:  [97 %-99 %] 99 % (10/05 0800) Weight:  [63.1 kg-65.3 kg] 63.1 kg (10/05 0800)    Weight change: Filed Weights   01/18/18 0414 01/18/18 0800  Weight: 65.3 kg 63.1 kg    Intake/Output:   Intake/Output Summary (Last 24 hours) at 01/18/2018 1022 Last data filed at 01/18/2018 0658 Gross per 24 hour  Intake 650 ml  Output -  Net 650 ml      Physical Exam    General:  Well appearing. No resp difficulty HEENT: Normal Neck: Supple. JVP 8 cm. Carotids 2+ bilat; no bruits. No lymphadenopathy or thyromegaly appreciated. Cor: PMI lateral. Regular rate & rhythm. +S3.  2/6 HSM apex. Lungs: Clear Abdomen: Soft, nontender, nondistended. No hepatosplenomegaly. No bruits or masses. Good bowel sounds. Extremities: No cyanosis, clubbing, rash, edema.  Neuro: Alert & orientedx3, cranial nerves grossly intact. moves all 4 extremities w/o difficulty. Affect pleasant Skin: ICD site benign. PICC line site benign.   Telemetry   Mild sinus tachy rate 100s (personally reviewed)  Labs    CBC Recent Labs    01/18/18 0418  WBC 11.6*  NEUTROABS 9.7*  HGB 15.0  HCT 41.4  MCV 92.2  PLT 175   Basic Metabolic Panel Recent Labs     01/18/18 0418  NA 130*  K 3.9  CL 96*  CO2 19*  GLUCOSE 117*  BUN 21*  CREATININE 1.37*  CALCIUM 9.4   Liver Function Tests Recent Labs    01/18/18 0418  AST 22  ALT 13  ALKPHOS 78  BILITOT 1.4*  PROT 7.7  ALBUMIN 3.7   No results for input(s): LIPASE, AMYLASE in the last 72 hours. Cardiac Enzymes No results for input(s): CKTOTAL, CKMB, CKMBINDEX, TROPONINI in the last 72 hours.  BNP: BNP (last 3 results) Recent Labs    06/13/17 2143 07/02/17 1531 12/30/17 0858  BNP 1,699.9* 532.9* 4,087.0*    ProBNP (last 3 results) No results for input(s): PROBNP in the last 8760 hours.   D-Dimer No results for input(s): DDIMER in the last 72 hours. Hemoglobin A1C No results for input(s): HGBA1C in the last 72 hours. Fasting Lipid Panel No results for input(s): CHOL, HDL, LDLCALC, TRIG, CHOLHDL, LDLDIRECT in the last 72 hours. Thyroid Function Tests No results for input(s): TSH, T4TOTAL, T3FREE, THYROIDAB in the last 72 hours.  Invalid input(s): FREET3  Other results:   Imaging    Dg Chest 2 View  Result Date: 01/18/2018 CLINICAL DATA:  52 y/o  M; sepsis. EXAM: CHEST - 2 VIEW COMPARISON:  01/06/2018 chest radiograph FINDINGS: Stable cardiomegaly given projection and technique. 3 lead AICD is stable. Left PICC line tip  is stable projecting over lower SVC. No consolidation, effusion, or pneumothorax. No acute osseous abnormality is evident. IMPRESSION: Stable cardiomegaly. No acute pulmonary process identified. Electronically Signed   By: Mitzi Hansen M.D.   On: 01/18/2018 04:58      Medications:     Scheduled Medications: . amiodarone  200 mg Oral BID  . aspirin EC  81 mg Oral Daily  . digoxin  0.125 mg Oral Daily  . enoxaparin (LOVENOX) injection  40 mg Subcutaneous Q24H  . nicotine  14 mg Transdermal Daily  . pantoprazole  40 mg Oral Daily  . sodium chloride flush  3 mL Intravenous Q12H  . spironolactone  12.5 mg Oral Daily      Infusions: . sodium chloride    . ceFEPime (MAXIPIME) IV    . metronidazole Stopped (01/18/18 1610)  . milrinone 0.25 mcg/kg/min (01/18/18 1006)  . vancomycin       PRN Medications:  sodium chloride, acetaminophen, albuterol, ondansetron (ZOFRAN) IV, sodium chloride flush    Assessment/Plan   1. ID: Patient presented with fever/chills/headache. Tm 100.1.  He had blood cultures drawn 10/4 and 10/5. BP soft but lactate normal.  PICC line site and ICD site look benign.  Of note, he had fever recently when he was in the hospital, resolved after removal of PICC line but no definite cause found. UA negative, CXR without PNA.   - He was started on broad spectrum abx in ER.  Will continue for now but will ask ID to see today to help guide [place].  May need to remove his new PICC line but would be very surprised if he had 2 PICC infections in rapid succession without signs of infection at the line insertion site. Of note, he had a TEE last admission without vegetation on valves or ICD wires noted.  - Pending culture data. - Will send procalcitonin and repeat lactate.  2. Chronic Systolic CHF:  Nonischemic cardiomyopathy, diagnosed in 2011, initially followed at [facility].  LHC (3/19) with no significant CAD.  CPX (3/19) submaximal, but suggestive of mild to moderate HF limitation. HIV negative 3/19, history of drug/ETOH abuse in the past, no drugs now and had cut back ETOH considerably.  Echo 9/19 showed EF 20% with severe LV dilation, severe MR.  NYHA class IIIb symptoms. He has a wide LBBB.  RHC was done in 9/19 showing low output, CI 1.4.  He was admitted to start milrinone, unable to wean off milrinone and remains on 0.25 mcg/kg/min at home.  [employer] ICD was placed, he has a His bundle lead but EP was unable to get an LV lead in place => His bundle pacing does not narrow his QRS and did not help [place] wean him off milrinone.  He has been doing well on milrinone, NYHA class II.  He is not  markedly volume overloaded.  Soft BP now in setting of fever/?early sepsis. - Hold Lasix for now with soft BP, ?early sepsis. - Continue digoxin, level ok.   - Continue home milrinone 0.25 mcg/kg/min.  - Continue spironolactone 12.5 daily.  - Unable to place LV lead as above.  - Not thought to be Mitraclip candidate (see below).  - At this time, his best option for long-term survival will be LVAD placement.  We have discussed this at length with patient and girlfriend. Current plan will be to continue milrinone at home until he has LVAD placed.  He will have tooth removals next week with Dr. Kristin Bruins (planned Monday).  Hopefully, can go ahead with his dental procedure as planned Monday morning.  We will then admit him on 10/11 for RHC/[place], then will plan LVAD placement on 10/14.  However, all this is contingent on [place] clearing him from an ID standpoint for LVAD surgery.  - He may be an eventual transplant candidate if he stays off cigarettes.  3. ETOH Abuse: No longer drinking.  4. Tobacco abuse: He has quit smoking since admission in 9/19.  5. Mitral regurgitation: Severe functional MR.  With LV dilated > 7 cm, suspect Mitraclip would be unlikely to help him appreciably.   6. VT: Patient had episode of VT requiring ATP.  Will start amiodarone 200 mg bid.   Will monitor in hospital this weekend, ID to assist.  May need his 2nd PICC line out but will have ID see first.  Hopefully, can go ahead and have dental procedure Monday morning.   Length of Stay: 0  Marca Ancona, MD  01/18/2018, 10:22 AM  Advanced Heart Failure Team Pager [phone] (M-F; 7a - 4p)  Please contact [facility] for night-coverage after hours (4p -7a ) and weekends on [email or web address]
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
## 2018-01-18 NOTE — H&P (Addendum)
Cardiology History & Physical    Patient ID: Carl Ferguson MRN: [id number], DOB: 03-15-1966 Date of Encounter: 01/18/2018, 5:23 AM Primary Physician: Jacquelin Hawking, PA-C  Chief Complaint: Fever   HPI: Carl Ferguson is a 52 y.o. 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  He was recently discharged from the advanced heart failure service after being admitted when his right heart cath showed profoundly low cardiac index.  He was started on milrinone and attempts were made to optimize his pacing system with placement of LV lead, however the latter was not successful.  Ultimately, he appeared to be dependent on milrinone, and plans were made for placement of LVAD later this month.  His hospital course was notable for high fever, which resolved after removal of a PICC line.  He had a PICC line replaced in his left upper extremity thereafter.  He was discharged home.  He did well at home, until about 2 nights ago, when he had the onset of very significant nausea, vomiting, and diarrhea.  Today the diarrhea and GI symptoms almost completely resolved, but he felt subjective fever, and presented to the emergency department for further evaluation.  He has had home nursing care for his PICC line, and he has not had any erythema purulence or any clinical concern for infection at that site since discharge.  In the ED, his temperature was 100.1, with a heart rate up to the 120s, with blood pressure 96/72.  Initial labs showed mild AKI with creatinine of 1.37 from 0.93 on discharge.  His white blood cell count was 11.6 and his lactate was normal.  His chest x-ray showed no acute findings.  Vancomycin, cefepime and Flagyl were administered in the emergency department, along with a 250 cc fluid bolus.  He was admitted to the cardiology service for further management.  Past Medical History:  Diagnosis Date  . AICD (automatic  cardioverter/defibrillator) present   . CHF (congestive heart failure) (HCC)   . COPD (chronic obstructive pulmonary disease) (HCC)   . Depression   . Dyspnea   . GERD (gastroesophageal reflux disease)   . Nonischemic cardiomyopathy (HCC)    a. diagnosed in 2011 with EF 30% at that time b. EF improved to 50% by echo in 02/2016  . Pneumonia   . Tachycardia      Surgical History:  Past Surgical History:  Procedure Laterality Date  . BIV ICD INSERTION CRT-D N/A 01/02/2018   Procedure: BIV ICD INSERTION CRT-D;  Surgeon: Marinus Maw, MD;  Location: [facility] INVASIVE CV LAB;  Service: Cardiovascular;  Laterality: N/A;  . ICD GENERATOR REMOVAL  2017  . LEFT HEART CATH AND CORONARY ANGIOGRAPHY N/A 06/26/2017   Procedure: LEFT HEART CATH AND CORONARY ANGIOGRAPHY;  Surgeon: [name], MD;  Location: [facility] INVASIVE CV LAB;  Service: Cardiovascular;  Laterality: N/A;  . RIGHT HEART CATH N/A 12/30/2017   Procedure: RIGHT HEART CATH;  Surgeon: Laurey Morale, MD;  Location: [facility] INVASIVE CV LAB;  Service: Cardiovascular;  Laterality: N/A;  . TEE WITHOUT CARDIOVERSION N/A 01/08/2018   Procedure: TRANSESOPHAGEAL ECHOCARDIOGRAM (TEE);  Surgeon: Laurey Morale, MD;  Location: [facility] ENDOSCOPY;  Service: Cardiovascular;  Laterality: N/A;     Home Meds: Prior to Admission medications   Medication Sig Start Date End Date Taking? Authorizing Provider  acetaminophen (TYLENOL) 500 MG tablet Take 500 mg by mouth every 6 (six) hours as needed for mild pain  or fever.    Yes [provider]  albuterol (PROVENTIL HFA;VENTOLIN HFA) 108 (90 Base) MCG/ACT inhaler Inhale 2 puffs into the lungs every 6 (six) hours as needed for wheezing or shortness of breath.   Yes [provider]  amiodarone (PACERONE) 200 MG tablet Take 1 tablet (200 mg total) by mouth 2 (two) times daily. 01/16/18  Yes Laurey Morale, MD  aspirin EC 81 MG tablet Take 1 tablet (81 mg total) by mouth daily. 12/26/17  Yes Clegg, Amy D,  NP  digoxin (LANOXIN) 0.125 MG tablet Take 1 tablet (0.125 mg total) by mouth daily. 12/26/17  Yes Clegg, Amy D, NP  diphenhydramine-acetaminophen (TYLENOL PM EXTRA STRENGTH) 25-500 MG TABS tablet Take 2 tablets by mouth at bedtime as needed (for sleep).   Yes [provider]  furosemide (LASIX) 80 MG tablet Take 80 mg (one tablet) twice daily. Patient taking differently: Take 80 mg by mouth 2 (two) times daily.  01/11/18  Yes Duke, Roe Rutherford, PA  milrinone [facility]) 1 MG/ML injection Inject into the vein continuous. 0.25 mcg/kg/min Weight: 66.5 kg Rate 1 ml/hr   Yes [provider]  nicotine (NICODERM CQ - DOSED IN MG/24 HOURS) 14 mg/24hr patch Place 14 mg onto the skin daily.   Yes [provider]  omeprazole (PRILOSEC) 20 MG capsule Take 20 mg by mouth daily.   Yes [provider]  potassium chloride SA (K-DUR,KLOR-CON) 20 MEQ tablet Take 2 tablets (40 mEq total) by mouth daily. 12/26/17  Yes Clegg, Amy D, NP  spironolactone (ALDACTONE) 25 MG tablet Take 0.5 tablets (12.5 mg total) by mouth daily. 01/11/18  Yes Duke, Roe Rutherford, PA  milrinone (PRIMACOR) 20 MG/100 ML SOLN infusion Inject 0.0163 mg/min into the vein continuous. Patient not taking: Reported on 01/18/2018 01/11/18   Marcelino Duster, PA    Allergies:  Allergies  Allergen Reactions  . Norflex [Orphenadrine] Swelling  . Orphenadrine Citrate Swelling and Other (See Comments)    Face swelling     Social History   Socioeconomic History  . Marital status: Single    Spouse name: Not on file  . Number of children: Not on file  . Years of education: Not on file  . Highest education level: Not on file  Occupational History  . Not on file  Social Needs  . Financial resource strain: Very hard  . Food insecurity:    Worry: Never true    Inability: Never true  . Transportation needs:    Medical: No    Non-medical: No  Tobacco Use  . Smoking status: Former Smoker    Packs/day:  0.50    Years: 33.00    Pack years: 16.50    Types: Cigarettes    Last attempt to quit: 12/29/2017    Years since quitting: 0.0  . Smokeless tobacco: Never Used  Substance and Sexual Activity  . Alcohol use: Yes    Frequency: Never    Comment: 3-4 beers daily.   . Drug use: No  . Sexual activity: Not Currently  Lifestyle  . Physical activity:    Days per week: Not on file    Minutes per session: Not on file  . Stress: Not on file  Relationships  . Social connections:    Talks on phone: Not on file    Gets together: Not on file    Attends religious service: Not on file    Active member of club or organization: Not on file  Attends meetings of clubs or organizations: Not on file    Relationship status: Not on file  . Intimate partner violence:    Fear of current or ex partner: Not on file    Emotionally abused: Not on file    Physically abused: Not on file    Forced sexual activity: Not on file  Other Topics Concern  . Not on file  Social History Narrative  . Not on file     Family History  Problem Relation Age of Onset  . Heart attack Mother   . Sudden Cardiac Death Neg Hx     Review of Systems: All other systems reviewed and are otherwise negative except as noted above.  Labs:   Lab Results  Component Value Date   WBC 11.6 (H) 01/18/2018   HGB 15.0 01/18/2018   HCT 41.4 01/18/2018   MCV 92.2 01/18/2018   PLT 175 01/18/2018    Recent Labs  Lab 01/18/18 0418  NA 130*  K 3.9  CL 96*  CO2 19*  BUN 21*  CREATININE 1.37*  CALCIUM 9.4  PROT 7.7  BILITOT 1.4*  ALKPHOS 78  ALT 13  AST 22  GLUCOSE 117*   No results for input(s): CKTOTAL, CKMB, TROPONINI in the last 72 hours. Lab Results  Component Value Date   CHOL 149 01/03/2018   HDL 42 01/03/2018   LDLCALC 98 01/03/2018   TRIG 43 01/03/2018   No results found for: DDIMER  Radiology/Studies:  Dg Orthopantogram  Result Date: 01/04/2018 CLINICAL DATA:  Pre operative evaluation for left  ventricular assist device insertion. EXAM: ORTHOPANTOGRAM/PANORAMIC COMPARISON:  None. FINDINGS: There are multiple missing teeth. Caries in tooth 2 and 3. Periapical lucency of tooth 29. No other appreciable abnormalities. IMPRESSION: Dental caries of tooth 2 and tooth 3. Probable small periapical abscess around the roots of tooth 29. Electronically Signed   By: Francene Boyers M.D.   On: 01/04/2018 11:36   Dg Chest 2 View  Result Date: 01/18/2018 CLINICAL DATA:  52 y/o  M; sepsis. EXAM: CHEST - 2 VIEW COMPARISON:  01/06/2018 chest radiograph FINDINGS: Stable cardiomegaly given projection and technique. 3 lead AICD is stable. Left PICC line tip is stable projecting over lower SVC. No consolidation, effusion, or pneumothorax. No acute osseous abnormality is evident. IMPRESSION: Stable cardiomegaly. No acute pulmonary process identified. Electronically Signed   By: Mitzi Hansen M.D.   On: 01/18/2018 04:58   Dg Chest 2 View  Result Date: 01/04/2018 CLINICAL DATA:  Pre operative evaluation prior to left ventricular assist device insertion. EXAM: CHEST - 2 VIEW COMPARISON:  Chest x-ray dated 01/03/2018 FINDINGS: There is cardiomegaly. AICD in place. PICC tip just below the carina in the superior vena cava. Pulmonary vascularity is normal. Lungs are clear. No bone abnormality. IMPRESSION: No acute abnormalities.  Chronic cardiomegaly. Electronically Signed   By: Francene Boyers M.D.   On: 01/04/2018 11:31   Dg Chest 2 View  Result Date: 01/03/2018 CLINICAL DATA:  Status post defibrillator placement EXAM: CHEST - 2 VIEW COMPARISON:  12/31/2017 FINDINGS: Defibrillator is now seen in satisfactory position. No pneumothorax is noted. Left-sided PICC line is noted in satisfactory position. Cardiac shadow remains enlarged. No focal infiltrate is seen. IMPRESSION: Status post defibrillator placement without acute abnormality. Electronically Signed   By: Alcide Clever M.D.   On: 01/03/2018 08:42   Dg Chest  2 View  Result Date: 12/31/2017 CLINICAL DATA:  Evaluate for CHF. EXAM: CHEST - 2 VIEW COMPARISON:  12/05/2017  FINDINGS: Left arm PICC line tip projects over the cavoatrial junction. Normal heart size. No pleural effusion or edema. No airspace opacities identified. The visualized osseous structures are unremarkable. IMPRESSION: 1. No evidence for CHF. Electronically Signed   By: Signa Kell M.D.   On: 12/31/2017 09:20   Ct Chest W Contrast  Result Date: 01/04/2018 CLINICAL DATA:  Nonischemic cardiomyopathy. Evaluation for insertion of left ventricular assist device. EXAM: CT CHEST, ABDOMEN, AND PELVIS WITH CONTRAST TECHNIQUE: Multidetector CT imaging of the chest, abdomen and pelvis was performed following the standard protocol during bolus administration of intravenous contrast. CONTRAST:  OMNIPAQUE IOHEXOL 300 MG/ML  SOLN COMPARISON:  Chest x-ray dated 01/03/2018 FINDINGS: CT CHEST FINDINGS Cardiovascular: Cardiomegaly with marked dilatation of the left ventricle. AICD in place. No pericardial effusion. Mediastinum/Nodes: No enlarged mediastinal, hilar, or axillary lymph nodes. Thyroid gland, trachea, and esophagus demonstrate no significant findings. Lungs/Pleura: Lungs are clear. No pleural effusion or pneumothorax. Musculoskeletal: No chest wall mass or suspicious bone lesions identified. CT ABDOMEN PELVIS FINDINGS Hepatobiliary: No focal liver abnormality is seen. No gallstones, gallbladder wall thickening, or biliary dilatation. Pancreas: Unremarkable. No pancreatic ductal dilatation or surrounding inflammatory changes. Spleen: Normal in size without focal abnormality. Adrenals/Urinary Tract: Adrenal glands are unremarkable. Kidneys are normal, without renal calculi, focal lesion, or hydronephrosis. Bladder is unremarkable. Stomach/Bowel: Stomach is within normal limits. Appendix appears normal. No evidence of bowel wall thickening, distention, or inflammatory changes. Vascular/Lymphatic: No  significant vascular findings are present. No enlarged abdominal or pelvic lymph nodes. Reproductive: Prostate is unremarkable. Other: No abdominal wall hernia or abnormality. No abdominopelvic ascites. Musculoskeletal: No acute abnormality. Severe degenerative disc disease at L4-5 and to a lesser degree at L5-S1. Osteophytes fuse the sacroiliac joints. IMPRESSION: 1. Cardiomegaly with marked dilatation of the left ventricle. 2. Otherwise normal CT scan of the chest. 3. No significant abnormality of the abdomen. Degenerative disc disease in the lower lumbar spine. Electronically Signed   By: Francene Boyers M.D.   On: 01/04/2018 11:44   Ct Abdomen Pelvis W Contrast  Result Date: 01/04/2018 CLINICAL DATA:  Nonischemic cardiomyopathy. Evaluation for insertion of left ventricular assist device. EXAM: CT CHEST, ABDOMEN, AND PELVIS WITH CONTRAST TECHNIQUE: Multidetector CT imaging of the chest, abdomen and pelvis was performed following the standard protocol during bolus administration of intravenous contrast. CONTRAST:  OMNIPAQUE IOHEXOL 300 MG/ML  SOLN COMPARISON:  Chest x-ray dated 01/03/2018 FINDINGS: CT CHEST FINDINGS Cardiovascular: Cardiomegaly with marked dilatation of the left ventricle. AICD in place. No pericardial effusion. Mediastinum/Nodes: No enlarged mediastinal, hilar, or axillary lymph nodes. Thyroid gland, trachea, and esophagus demonstrate no significant findings. Lungs/Pleura: Lungs are clear. No pleural effusion or pneumothorax. Musculoskeletal: No chest wall mass or suspicious bone lesions identified. CT ABDOMEN PELVIS FINDINGS Hepatobiliary: No focal liver abnormality is seen. No gallstones, gallbladder wall thickening, or biliary dilatation. Pancreas: Unremarkable. No pancreatic ductal dilatation or surrounding inflammatory changes. Spleen: Normal in size without focal abnormality. Adrenals/Urinary Tract: Adrenal glands are unremarkable. Kidneys are normal, without renal calculi, focal  lesion, or hydronephrosis. Bladder is unremarkable. Stomach/Bowel: Stomach is within normal limits. Appendix appears normal. No evidence of bowel wall thickening, distention, or inflammatory changes. Vascular/Lymphatic: No significant vascular findings are present. No enlarged abdominal or pelvic lymph nodes. Reproductive: Prostate is unremarkable. Other: No abdominal wall hernia or abnormality. No abdominopelvic ascites. Musculoskeletal: No acute abnormality. Severe degenerative disc disease at L4-5 and to a lesser degree at L5-S1. Osteophytes fuse the sacroiliac joints. IMPRESSION: 1. Cardiomegaly with marked  dilatation of the left ventricle. 2. Otherwise normal CT scan of the chest. 3. No significant abnormality of the abdomen. Degenerative disc disease in the lower lumbar spine. Electronically Signed   By: Francene Boyers M.D.   On: 01/04/2018 11:44   Dg Chest Port 1 View  Result Date: 01/06/2018 CLINICAL DATA:  Tachycardia EXAM: PORTABLE CHEST 1 VIEW COMPARISON:  January 04, 2018 FINDINGS: There is cardiomegaly. Pulmonary vascularity is normal. No adenopathy. Central catheter tip is in the superior vena cava. No pneumothorax. Pacemaker leads are attached to the right atrium and right ventricle. No adenopathy. No bone lesions. IMPRESSION: Central catheter tip in superior vena cava. Pacemaker leads unchanged. No pneumothorax. Stable cardiomegaly. Lungs clear. Electronically Signed   By: Bretta Bang III M.D.   On: 01/06/2018 09:09   Irpicc Placement Left >5 [employer] Img Guide  Result Date: 01/10/2018 INDICATION: Arrhythmia EXAM: LEFT UPPER EXTREMITY PICC LINE PLACEMENT WITH ULTRASOUND AND FLUOROSCOPIC GUIDANCE MEDICATIONS: None ANESTHESIA/SEDATION: None FLUOROSCOPY TIME:  Fluoroscopy Time:  minutes 36 seconds (2 mGy). COMPLICATIONS: None immediate. PROCEDURE: The patient was advised of the possible risks and complications and agreed to undergo the procedure. The patient was then brought to the  angiographic suite for the procedure. The left arm was prepped with chlorhexidine, draped in the usual sterile fashion using maximum barrier technique (cap and mask, sterile gown, sterile gloves, large sterile sheet, hand hygiene and cutaneous antiseptic). Local anesthesia was attained by infiltration with 1% lidocaine. Ultrasound demonstrated patency of the basilic vein, and this was documented with an image. Under real-time ultrasound guidance, this vein was accessed with a 21 gauge micropuncture needle and image documentation was performed. The needle was exchanged over a guidewire for a peel-away sheath through which a 43 cm 5 [place] single lumen power injectable PICC was advanced, and positioned with its tip at the lower SVC/right atrial junction. Fluoroscopy during the procedure and fluoro spot radiograph confirms appropriate catheter position. The catheter was flushed, secured to the skin with Prolene sutures, and covered with a sterile dressing. IMPRESSION: Successful placement of a left arm PICC with sonographic and fluoroscopic guidance. The catheter is ready for use. Electronically Signed   By: Jolaine Click M.D.   On: 01/10/2018 12:54   [place] Ekg Site Rite  Result Date: 12/30/2017 If Site Rite image not attached, placement could not be confirmed due to current cardiac rhythm.  Wt Readings from Last 3 Encounters:  01/18/18 65.3 kg  01/16/18 65.4 kg  01/16/18 65.2 kg    EKG: Sinus tachycardia, ventricular pacing, occasional PVC.  Physical Exam: Blood pressure 96/72, pulse (!) 120, temperature 100.1 F (37.8 C), temperature source Oral, resp. rate 15, height 5\' 7"  (1.702 m), weight 65.3 kg, SpO2 99 %. Body mass index is 22.55 kg/m. General: Well developed, well nourished, in no acute distress. Head: Normocephalic, atraumatic, sclera non-icteric, no xanthomas, nares are without discharge.  Neck: Negative for carotid bruits. JVD not elevated. Lungs: Clear bilaterally to auscultation without  wheezes, rales, or rhonchi. Breathing is unlabored. Heart: Tachycardic, regular, systolic murmur at the apex.  S3 present Abdomen: Soft, non-tender, non-distended with normoactive bowel sounds. No hepatomegaly. No rebound/guarding. No obvious abdominal masses. Msk:  Strength and tone appear normal for age. Extremities: No clubbing or cyanosis. No edema.  Distal pedal pulses are 2+ and equal bilaterally. Neuro: Alert and oriented X 3. No focal deficit. No facial asymmetry. Moves all extremities spontaneously. Psych:  Responds to questions appropriately with a normal affect.  Assessment and Plan  52 year old man with nonischemic cardiomyopathy, dependent on milrinone, and awaiting VAD placement, who presents with fever.    1. Fever:  His constellation of fever and GI symptoms seems most consistent with a viral process.  Overall, he looks relatively well on exam.  We will plan to follow his blood cultures, check a urinalysis and urine culture, check a C. Difficile, and follow up a viral panel.  Clinically, his PICC line does not appear to be infected, so we will leave this in place for now while awaiting culture data.  I will plan to hold off on any further antibiotics for now.  2.  Chronic systolic heart failure: Continue home milrinone, digoxin, and Spironolactone.  Will hold off on home Lasix dosing for now, as the patient clinically seems mildly hypovolemic based on history, exam and labs.  Can likely resume within the next 1 to 2 days.  3.  Mild AKI: Holding furosemide for now, as after mentioned.  Can consider further testing if it fails to improve.  Signed, Esmond Plants, MD 01/18/2018, 5:23 AM

## 2018-01-18 NOTE — Consult Note (Signed)
[facility]    Date of Admission:  01/18/2018           Day 1 vancomycin        Day 1 cefepime        Day 1 metronidazole       Reason for Consult: Fever and diarrhea    Referring Provider: Dr. Elly Modena  Assessment: The cause of his fever is not entirely clear but I am certainly concerned about the possibility of C. difficile colitis given his diarrhea and recent antibiotic exposure.  He is also at risk for infection from his PICC line and AICD.  He does not have any evidence of pneumonia or urinary tract infection.  Plan: 1. Continue vancomycin and cefepime 2. Discontinue metronidazole 3. Await results of final blood cultures and C. difficile screen  Principal Problem:   Fever Active Problems:   Diarrhea   Nonischemic cardiomyopathy (HCC)   ICD (implantable cardioverter-defibrillator) in place   Scheduled Meds: . amiodarone  200 mg Oral BID  . aspirin EC  81 mg Oral Daily  . digoxin  0.125 mg Oral Daily  . enoxaparin (LOVENOX) injection  40 mg Subcutaneous Q24H  . nicotine  14 mg Transdermal Daily  . pantoprazole  40 mg Oral Daily  . sodium chloride flush  3 mL Intravenous Q12H  . spironolactone  12.5 mg Oral Daily   Continuous Infusions: . sodium chloride    . ceFEPime (MAXIPIME) IV    . metronidazole Stopped (01/18/18 0932)  . milrinone 0.25 mcg/kg/min (01/18/18 1006)  . vancomycin     PRN Meds:.sodium chloride, acetaminophen, albuterol, ondansetron (ZOFRAN) IV, sodium chloride flush  HPI: Carl Ferguson is a 52 y.o. male with nonischemic cardiomyopathy and and implantable defibrillator.  He was hospitalized on 12/30/2017 and started on IV milrinone in anticipation of LVAD placement.  He spiked a temperature of 103.1 degrees on 01/06/2018.  He was started on empiric vancomycin and cefepime and defervesced promptly.  Blood cultures were negative.  There was no evidence of endocarditis or AICD infection by exam or TEE.  His  PICC line was removed and replaced.  Empiric antibiotics were stopped after 4 days.  He tells me that he was having chills when he was in the hospital even though his fevers appeared to have resolved.  The chills persisted after discharge and he started having fever, nausea, vomiting and diarrhea leading to readmission this morning.   Review of Systems: Review of Systems  Constitutional: Positive for chills, fever and malaise/fatigue. Negative for diaphoresis and weight loss.  HENT: Negative for congestion and sore throat.   Respiratory: Positive for cough. Negative for sputum production and shortness of breath.   Cardiovascular: Negative for chest pain.  Gastrointestinal: Positive for diarrhea, nausea and vomiting. Negative for abdominal pain.  Genitourinary: Negative for dysuria.  Musculoskeletal: Negative for back pain, joint pain and neck pain.  Skin: Negative for rash.  Neurological: Negative for headaches.    Past Medical History:  Diagnosis Date  . AICD (automatic cardioverter/defibrillator) present   . CHF (congestive heart failure) (HCC)   . COPD (chronic obstructive pulmonary disease) (HCC)   . Depression   . Dyspnea   . GERD (gastroesophageal reflux disease)   . Nonischemic cardiomyopathy (HCC)    a. diagnosed in 2011 with EF 30% at that time b. EF improved to 50% by echo in 02/2016  . Pneumonia   . Tachycardia  Social History   Tobacco Use  . Smoking status: Former Smoker    Packs/day: 0.50    Years: 33.00    Pack years: 16.50    Types: Cigarettes    Last attempt to quit: 12/29/2017    Years since quitting: 0.0  . Smokeless tobacco: Never Used  Substance Use Topics  . Alcohol use: Yes    Frequency: Never    Comment: 3-4 beers daily.   . Drug use: No    Family History  Problem Relation Age of Onset  . Heart attack Mother   . Sudden Cardiac Death Neg Hx    Allergies  Allergen Reactions  . Norflex [Orphenadrine] Swelling  . Orphenadrine Citrate  Swelling and Other (See Comments)    Face swelling     OBJECTIVE: Blood pressure 96/84, pulse 96, temperature 98 F (36.7 C), temperature source Oral, resp. rate (!) 25, height 5\' 7"  (1.702 m), weight 63.1 kg, SpO2 99 %.  Physical Exam  Constitutional: He is oriented to person, place, and time.  He is resting quietly in bed.  HENT:  Mouth/Throat: No oropharyngeal exudate.  Eyes: Conjunctivae are normal.  Neck: Neck supple.  Cardiovascular: Normal rate and regular rhythm.  Murmur heard. Pulmonary/Chest: Effort normal and breath sounds normal. He has no wheezes. He has no rales.    Abdominal: Soft. He exhibits no distension. There is no tenderness.  Musculoskeletal: He exhibits no edema or tenderness.  Neurological: He is alert and oriented to person, place, and time.  Skin: No rash noted.  Left arm PICC site looks good.  Psychiatric: He has a normal mood and affect.    Lab Results Lab Results  Component Value Date   WBC 11.6 (H) 01/18/2018   HGB 15.0 01/18/2018   HCT 41.4 01/18/2018   MCV 92.2 01/18/2018   PLT 175 01/18/2018    Lab Results  Component Value Date   CREATININE 1.37 (H) 01/18/2018   BUN 21 (H) 01/18/2018   NA 130 (L) 01/18/2018   K 3.9 01/18/2018   CL 96 (L) 01/18/2018   CO2 19 (L) 01/18/2018    Lab Results  Component Value Date   ALT 13 01/18/2018   AST 22 01/18/2018   ALKPHOS 78 01/18/2018   BILITOT 1.4 (H) 01/18/2018     Microbiology: No results found for this or any previous visit (from the past 240 hour(s)).  Cliffton Asters, MD [facility] [phone] pager   [phone] cell 01/18/2018, 11:40 AM

## 2018-01-18 NOTE — ED Notes (Signed)
Pt aware of need for urine sample for UA, urinal at bedside 

## 2018-01-19 DIAGNOSIS — L929 Granulomatous disorder of the skin and subcutaneous tissue, unspecified: Secondary | ICD-10-CM | POA: Diagnosis present

## 2018-01-19 DIAGNOSIS — N179 Acute kidney failure, unspecified: Secondary | ICD-10-CM | POA: Diagnosis present

## 2018-01-19 DIAGNOSIS — R6889 Other general symptoms and signs: Secondary | ICD-10-CM | POA: Diagnosis present

## 2018-01-19 DIAGNOSIS — I5022 Chronic systolic (congestive) heart failure: Secondary | ICD-10-CM | POA: Diagnosis not present

## 2018-01-19 DIAGNOSIS — K08109 Complete loss of teeth, unspecified cause, unspecified class: Secondary | ICD-10-CM | POA: Diagnosis not present

## 2018-01-19 DIAGNOSIS — D72829 Elevated white blood cell count, unspecified: Secondary | ICD-10-CM | POA: Diagnosis not present

## 2018-01-19 DIAGNOSIS — I509 Heart failure, unspecified: Secondary | ICD-10-CM | POA: Diagnosis not present

## 2018-01-19 DIAGNOSIS — K08499 Partial loss of teeth due to other specified cause, unspecified class: Secondary | ICD-10-CM | POA: Diagnosis not present

## 2018-01-19 DIAGNOSIS — R011 Cardiac murmur, unspecified: Secondary | ICD-10-CM | POA: Diagnosis not present

## 2018-01-19 DIAGNOSIS — I472 Ventricular tachycardia: Secondary | ICD-10-CM | POA: Diagnosis present

## 2018-01-19 DIAGNOSIS — Z95811 Presence of heart assist device: Secondary | ICD-10-CM | POA: Diagnosis not present

## 2018-01-19 DIAGNOSIS — K047 Periapical abscess without sinus: Secondary | ICD-10-CM | POA: Diagnosis present

## 2018-01-19 DIAGNOSIS — I447 Left bundle-branch block, unspecified: Secondary | ICD-10-CM | POA: Diagnosis present

## 2018-01-19 DIAGNOSIS — J810 Acute pulmonary edema: Secondary | ICD-10-CM | POA: Diagnosis not present

## 2018-01-19 DIAGNOSIS — J449 Chronic obstructive pulmonary disease, unspecified: Secondary | ICD-10-CM | POA: Diagnosis present

## 2018-01-19 DIAGNOSIS — R197 Diarrhea, unspecified: Secondary | ICD-10-CM | POA: Diagnosis present

## 2018-01-19 DIAGNOSIS — K219 Gastro-esophageal reflux disease without esophagitis: Secondary | ICD-10-CM | POA: Diagnosis present

## 2018-01-19 DIAGNOSIS — S20219A Contusion of unspecified front wall of thorax, initial encounter: Secondary | ICD-10-CM | POA: Diagnosis not present

## 2018-01-19 DIAGNOSIS — K029 Dental caries, unspecified: Secondary | ICD-10-CM | POA: Diagnosis present

## 2018-01-19 DIAGNOSIS — E861 Hypovolemia: Secondary | ICD-10-CM | POA: Diagnosis present

## 2018-01-19 DIAGNOSIS — R112 Nausea with vomiting, unspecified: Secondary | ICD-10-CM | POA: Diagnosis present

## 2018-01-19 DIAGNOSIS — Z95828 Presence of other vascular implants and grafts: Secondary | ICD-10-CM | POA: Diagnosis not present

## 2018-01-19 DIAGNOSIS — Z888 Allergy status to other drugs, medicaments and biological substances status: Secondary | ICD-10-CM | POA: Diagnosis not present

## 2018-01-19 DIAGNOSIS — R570 Cardiogenic shock: Secondary | ICD-10-CM | POA: Diagnosis present

## 2018-01-19 DIAGNOSIS — I4891 Unspecified atrial fibrillation: Secondary | ICD-10-CM | POA: Diagnosis not present

## 2018-01-19 DIAGNOSIS — K045 Chronic apical periodontitis: Secondary | ICD-10-CM | POA: Diagnosis present

## 2018-01-19 DIAGNOSIS — R5081 Fever presenting with conditions classified elsewhere: Secondary | ICD-10-CM | POA: Diagnosis not present

## 2018-01-19 DIAGNOSIS — Z95810 Presence of automatic (implantable) cardiac defibrillator: Secondary | ICD-10-CM | POA: Diagnosis not present

## 2018-01-19 DIAGNOSIS — R079 Chest pain, unspecified: Secondary | ICD-10-CM | POA: Diagnosis not present

## 2018-01-19 DIAGNOSIS — F1011 Alcohol abuse, in remission: Secondary | ICD-10-CM | POA: Diagnosis present

## 2018-01-19 DIAGNOSIS — E871 Hypo-osmolality and hyponatremia: Secondary | ICD-10-CM | POA: Diagnosis not present

## 2018-01-19 DIAGNOSIS — R509 Fever, unspecified: Secondary | ICD-10-CM | POA: Diagnosis not present

## 2018-01-19 DIAGNOSIS — I255 Ischemic cardiomyopathy: Secondary | ICD-10-CM | POA: Diagnosis not present

## 2018-01-19 DIAGNOSIS — K036 Deposits [accretions] on teeth: Secondary | ICD-10-CM | POA: Diagnosis present

## 2018-01-19 DIAGNOSIS — E876 Hypokalemia: Secondary | ICD-10-CM | POA: Diagnosis not present

## 2018-01-19 DIAGNOSIS — I5043 Acute on chronic combined systolic (congestive) and diastolic (congestive) heart failure: Secondary | ICD-10-CM | POA: Diagnosis not present

## 2018-01-19 DIAGNOSIS — I428 Other cardiomyopathies: Secondary | ICD-10-CM | POA: Diagnosis present

## 2018-01-19 DIAGNOSIS — I5023 Acute on chronic systolic (congestive) heart failure: Secondary | ICD-10-CM | POA: Diagnosis not present

## 2018-01-19 DIAGNOSIS — I361 Nonrheumatic tricuspid (valve) insufficiency: Secondary | ICD-10-CM | POA: Diagnosis not present

## 2018-01-19 DIAGNOSIS — I313 Pericardial effusion (noninflammatory): Secondary | ICD-10-CM | POA: Diagnosis not present

## 2018-01-19 DIAGNOSIS — I5089 Other heart failure: Secondary | ICD-10-CM | POA: Diagnosis not present

## 2018-01-19 DIAGNOSIS — I340 Nonrheumatic mitral (valve) insufficiency: Secondary | ICD-10-CM | POA: Diagnosis present

## 2018-01-19 DIAGNOSIS — I5020 Unspecified systolic (congestive) heart failure: Secondary | ICD-10-CM | POA: Diagnosis not present

## 2018-01-19 DIAGNOSIS — R5082 Postprocedural fever: Secondary | ICD-10-CM | POA: Diagnosis not present

## 2018-01-19 LAB — BASIC METABOLIC PANEL
Anion gap: 11 (ref 5–15)
BUN: 14 mg/dL (ref 6–20)
CALCIUM: 9.5 mg/dL (ref 8.9–10.3)
CO2: 22 mmol/L (ref 22–32)
CREATININE: 0.86 mg/dL (ref 0.61–1.24)
Chloride: 100 mmol/L (ref 98–111)
GFR calc Af Amer: >60 mL/min (ref >60)
GLUCOSE: 109 mg/dL — AB (ref 70–99)
Potassium: 3.8 mmol/L (ref 3.5–5.1)
Sodium: 133 mmol/L — ABNORMAL LOW (ref 135–145)

## 2018-01-19 LAB — URINE CULTURE: Culture: NO GROWTH

## 2018-01-19 LAB — COOXEMETRY PANEL
Carboxyhemoglobin: 1.5 % (ref 0.5–1.5)
Methemoglobin: 1.5 % (ref 0.0–1.5)
O2 Saturation: 63.8 %
Total hemoglobin: 13.5 g/dL (ref 12.0–16.0)

## 2018-01-19 LAB — VANCOMYCIN, TROUGH: Vancomycin Tr: 12 ug/mL — ABNORMAL LOW (ref 15–20)

## 2018-01-19 MED ORDER — POTASSIUM CHLORIDE CRYS ER 20 MEQ PO TBCR
20.0000 meq | EXTENDED_RELEASE_TABLET | Freq: Once | ORAL | Status: AC
  Administered 2018-01-19: 20 meq via ORAL
  Filled 2018-01-19: qty 1

## 2018-01-19 MED ORDER — VANCOMYCIN HCL IN DEXTROSE 1-5 GM/200ML-% IV SOLN
1000.0000 mg | Freq: Three times a day (TID) | INTRAVENOUS | Status: DC
  Administered 2018-01-19 – 2018-01-20 (×3): 1000 mg via INTRAVENOUS
  Filled 2018-01-19 (×3): qty 200

## 2018-01-19 NOTE — Progress Notes (Addendum)
Patient ID: Carl Ferguson, male   DOB: 1966/04/04, 52 y.o.   MRN: [id number]     Advanced Heart Failure Rounding Note  PCP-Cardiologist: Garwin Brothers, MD   Subjective:    Feeling better today. Afebrile overnight but PCT 9.17. Co-ox 64%.  Seen by ID have not found localizing source of infection. C. Diff screen is negative. All cx negative to date  Objective:   Weight Range: 63.5 kg Body mass index is 21.93 kg/m.   Vital Signs:   Temp:  [98.8 F (37.1 C)] 98.8 F (37.1 C) (10/06 1141) Pulse Rate:  [101-113] 101 (10/06 1141) Resp:  [15-29] 22 (10/06 1141) BP: (101-116)/(73-87) 101/73 (10/06 1141) SpO2:  [97 %-99 %] 99 % (10/06 0848) Weight:  [63.5 kg] 63.5 kg (10/06 0500) Last BM Date: 01/18/18  Weight change: Filed Weights   01/18/18 0414 01/18/18 0800 01/19/18 0500  Weight: 65.3 kg 63.1 kg 63.5 kg    Intake/Output:   Intake/Output Summary (Last 24 hours) at 01/19/2018 1219 Last data filed at 01/19/2018 1100 Gross per 24 hour  Intake 2509.17 ml  Output 200 ml  Net 2309.17 ml      Physical Exam    General:  Thin male lying in bed. No resp difficulty HEENT: normal Neck: supple. JVP 6-7. Carotids 2+ bilat; no bruits. No lymphadenopathy or thryomegaly appreciated. Cor: PMI nondisplaced. Regular rate & rhythm.+ s3 2/6 MR Lungs: clear Abdomen: soft, nontender, nondistended. No hepatosplenomegaly. No bruits or masses. Good bowel sounds. Extremities: no cyanosis, clubbing, rash, edema Neuro: alert & orientedx3, cranial nerves grossly intact. moves all 4 extremities w/o difficulty. Affect pleasant PICC site ok   Telemetry   Sinus tach 100-105 (personally reviewed)  Labs    CBC Recent Labs    01/18/18 0418  WBC 11.6*  NEUTROABS 9.7*  HGB 15.0  HCT 41.4  MCV 92.2  PLT 175   Basic Metabolic Panel Recent Labs    14/97/02 0418 01/19/18 0310  NA 130* 133*  K 3.9 3.8  CL 96* 100  CO2 19* 22  GLUCOSE 117* 109*  BUN 21* 14  CREATININE 1.37* 0.86    CALCIUM 9.4 9.5   Liver Function Tests Recent Labs    01/18/18 0418  AST 22  ALT 13  ALKPHOS 78  BILITOT 1.4*  PROT 7.7  ALBUMIN 3.7   No results for input(s): LIPASE, AMYLASE in the last 72 hours. Cardiac Enzymes No results for input(s): CKTOTAL, CKMB, CKMBINDEX, TROPONINI in the last 72 hours.  BNP: BNP (last 3 results) Recent Labs    06/13/17 2143 07/02/17 1531 12/30/17 0858  BNP 1,699.9* 532.9* 4,087.0*    ProBNP (last 3 results) No results for input(s): PROBNP in the last 8760 hours.   D-Dimer No results for input(s): DDIMER in the last 72 hours. Hemoglobin A1C No results for input(s): HGBA1C in the last 72 hours. Fasting Lipid Panel No results for input(s): CHOL, HDL, LDLCALC, TRIG, CHOLHDL, LDLDIRECT in the last 72 hours. Thyroid Function Tests No results for input(s): TSH, T4TOTAL, T3FREE, THYROIDAB in the last 72 hours.  Invalid input(s): FREET3  Other results:   Imaging    No results found.   Medications:     Scheduled Medications: . amiodarone  200 mg Oral BID  . aspirin EC  81 mg Oral Daily  . digoxin  0.125 mg Oral Daily  . enoxaparin (LOVENOX) injection  40 mg Subcutaneous Q24H  . nicotine  14 mg Transdermal Daily  . pantoprazole  40 mg  Oral Daily  . sodium chloride flush  10-40 mL Intracatheter Q12H  . sodium chloride flush  3 mL Intravenous Q12H  . spironolactone  12.5 mg Oral Daily    Infusions: . sodium chloride    . ceFEPime (MAXIPIME) IV 1 g (01/19/18 0507)  . milrinone 0.2505 mcg/kg/min (01/18/18 2300)  . vancomycin 750 mg (01/19/18 0611)    PRN Medications: sodium chloride, acetaminophen, albuterol, ondansetron (ZOFRAN) IV, sodium chloride flush, sodium chloride flush    Assessment/Plan   1. ID: Patient presented with fever/chills/headache. Tm 100.1.  He had blood cultures drawn 10/4 and 10/5. BP soft but lactate normal.  PICC line site and ICD site look benign.  Of note, he had fever recently when he was in the  hospital, resolved after removal of PICC line but no definite cause found. TEE last admission without vegetation on valves or ICD wires noted.  UA negative, CXR without PNA.  Now afebrile. Cultures remain negative. C. Diff negative but PCT 9.17. ID has seen and have not found localizing source - Per ID recs will continue vanc/cefipime pending further culture data - No CBC today. Will repeat in am.  - I wonder if a tagged WBC scan may be of any utility - Hopefully, can go ahead and have dental procedure Monday morning.    2. Chronic Systolic CHF:  Nonischemic cardiomyopathy, diagnosed in 2011, initially followed at [facility].  LHC (3/19) with no significant CAD.  CPX (3/19) submaximal, but suggestive of mild to moderate HF limitation. HIV negative 3/19, history of drug/ETOH abuse in the past, no drugs now and had cut back ETOH considerably.  Echo 9/19 showed EF 20% with severe LV dilation, severe MR.  NYHA class IIIb symptoms. He has a wide LBBB.  RHC was done in 9/19 showing low output, CI 1.4.  He was admitted to start milrinone, unable to wean off milrinone and remains on 0.25 mcg/kg/min at home.  [employer] ICD was placed, he has a His bundle lead but EP was unable to get an LV lead in place => His bundle pacing does not narrow his QRS and did not help [place] wean him off milrinone.  He has been doing well on milrinone, NYHA class II.  He is not markedly volume overloaded.  Soft BP now in setting of fever/?early sepsis. - Holding Lasix for now with soft BP, ?early sepsis. - Continue digoxin, level ok.   - Continue home milrinone 0.25 mcg/kg/min.  - Continue spironolactone 12.5 daily.  - Unable to place LV lead as above.  - Not thought to be Mitraclip candidate (see below).  - At this time, his best option for long-term survival will be LVAD placement.  We have discussed this at length with patient and girlfriend. Current plan will be to continue milrinone at home until he has LVAD placed.  He will have  tooth removals next week with Dr. Kristin Bruins (planned Monday).  Hopefully, can go ahead with his dental procedure as planned Monday morning.  We will then admit him on 10/11 for RHC/[place], then will plan LVAD placement on 10/14.  However, all this is contingent on [place] clearing him from an ID standpoint for LVAD surgery.  - He may be an eventual transplant candidate if he stays off cigarettes.  3. ETOH Abuse: No longer drinking.  4. Tobacco abuse: He has quit smoking since admission in 9/19.  5. Mitral regurgitation: Severe functional MR.  With LV dilated > 7 cm, suspect Mitraclip would be unlikely to  help him appreciably.   6. VT: Patient had episode of VT requiring ATP.   - Continue amiodarone 200 mg bid.  - Keep K> 4.0 Mg > 2.0   Length of Stay: 0  Arvilla Meres, MD  01/19/2018, 12:19 PM  Advanced Heart Failure Team Pager [phone] (M-F; 7a - 4p)  Please contact [facility] for night-coverage after hours (4p -7a ) and weekends on [email or web address]

## 2018-01-19 NOTE — Progress Notes (Addendum)
Pharmacy Antibiotic Note  Carl Ferguson is a 52 y.o. male admitted on 01/18/2018 with sepsis - unknown source. Pt with recent hospitalization for HF- discharged one week ago.  Pharmacy has been consulted for Vancomycin and Cefepime dosing. Pt now afebrile, WBC 11.6, PCT 9.17, LA 1.4>1.3.   Vancomycin trough obtained to assess safety and efficacy of therapy. Trough slightly subtherapeutic at 12 on vancomycin 750 mg IV q8h prior to the fourth dose. Trough drawn appropriately ~ 8 hours after last dose. Scr improved from yesterday.    Plan: Increase vancomycin to 1000 mg IV q8h Cefepime 1gm IV q12h Will f/u micro data, renal function, and pt's clinical condition   Height: 5\' 7"  (170.2 cm) Weight: 139 lb 15.9 oz (63.5 kg) IBW/kg (Calculated) : 66.1  Temp (24hrs), Avg:98.8 F (37.1 C), Min:98.8 F (37.1 C), Max:98.8 F (37.1 C)  Recent Labs  Lab 01/18/18 0417 01/18/18 0418 01/18/18 1056 01/18/18 1313 01/19/18 0310 01/19/18 1354  WBC  --  11.6*  --   --   --   --   CREATININE  --  1.37*  --   --  0.86  --   LATICACIDVEN 1.23  --  1.4 1.3  --   --   VANCOTROUGH  --   --   --   --   --  12*    Estimated Creatinine Clearance: 92.3 mL/min (by C-G formula based on SCr of 0.86 mg/dL).    Allergies  Allergen Reactions  . Norflex [Orphenadrine] Swelling  . Orphenadrine Citrate Swelling and Other (See Comments)    Face swelling     Antimicrobials this admission: 10/5 Vanc >>  10/5 Cefepime >>  10/3 Flagyl x1  Microbiology results: 10/5 BCx:  10/5 UCx: NG   Monitoring: 10/6 VT 12 on vanc 750 mg IV q8h   Thank you for allowing pharmacy to be a part of this patient's care.   Marcelino Freestone, PharmD PGY2 Cardiology Pharmacy Resident Phone [phone] Please check AMION for all Pharmacist numbers by unit 01/19/2018 2:57 PM

## 2018-01-19 NOTE — Anesthesia Preprocedure Evaluation (Addendum)
Anesthesia Evaluation  Patient identified by MRN, date of birth, ID band Patient awake    Reviewed: Allergy & Precautions, H&P , NPO status , Patient's Chart, lab work & pertinent test results, reviewed documented beta blocker date and time   Airway Mallampati: II  TM Distance: >3 FB Neck ROM: full    Dental no notable dental hx.    Pulmonary neg pulmonary ROS, former smoker,    Pulmonary exam normal breath sounds clear to auscultation       Cardiovascular Exercise Tolerance: Good hypertension, +CHF  + dysrhythmias + Cardiac Defibrillator  Rhythm:regular Rate:Normal  ECHO 9/19 Left ventricle:  Severely dilated left ventricle with diffuse hypokinesis, septal-lateral dyssynchrony. EF 15-20%.  Cardiology note 10/19 Chronic Systolic CHF: Nonischemic cardiomyopathy, diagnosed in 2011. LHC (3/19) with no significant CAD. CPX (3/19) submaximal, but suggestive of mild to moderate HF limitation.  history of drug/ETOH abuse in the past, no drugs now and hadcut back ETOH considerably. Echo 9/19 showedEF 20% with severe LV dilation, severe MR. NYHA class IIIb symptoms. He has a wide LBBB. RHC was done in 9/19 showing low output, CI 1.4.    Neuro/Psych Depression negative neurological ROS     GI/Hepatic GERD  ,(+)     substance abuse  alcohol use,   Endo/Other  negative endocrine ROS  Renal/GU negative Renal ROS  negative genitourinary   Musculoskeletal negative musculoskeletal ROS (+)   Abdominal   Peds  Hematology negative hematology ROS (+)   Anesthesia Other Findings   Reproductive/Obstetrics negative OB ROS                            Anesthesia Physical Anesthesia Plan  ASA: IV  Anesthesia Plan: General   Post-op Pain Management:    Induction: Intravenous  PONV Risk Score and Plan: 2 and Ondansetron and Treatment may vary due to age or medical condition  Airway Management  Planned: Oral ETT  Additional Equipment: Arterial line  Intra-op Plan:   Post-operative Plan: Extubation in OR  Informed Consent: I have reviewed the patients History and Physical, chart, labs and discussed the procedure including the risks, benefits and alternatives for the proposed anesthesia with the patient or authorized representative who has indicated his/her understanding and acceptance.   Dental Advisory Given  Plan Discussed with: CRNA, Anesthesiologist and Surgeon  Anesthesia Plan Comments: (  )       Anesthesia Quick Evaluation

## 2018-01-19 NOTE — Progress Notes (Signed)
Patient ID: Carl Ferguson, male   DOB: 02-06-1966, 52 y.o.   MRN: [id number]         [facility]  Date of Admission:  01/18/2018           Day 2 vancomycin        Day 2 cefepime ASSESSMENT: The cause of his recurrent fever is unclear.  His C. difficile screen is negative.  Blood and urine cultures are negative.  He has no evidence of pneumonia, PICC infection or AICD infection.  PLAN: 1. Continue current antibiotics pending further observation and final culture results.  Principal Problem:   Fever Active Problems:   Diarrhea   Nonischemic cardiomyopathy (HCC)   ICD (implantable cardioverter-defibrillator) in place   Scheduled Meds: . amiodarone  200 mg Oral BID  . aspirin EC  81 mg Oral Daily  . digoxin  0.125 mg Oral Daily  . enoxaparin (LOVENOX) injection  40 mg Subcutaneous Q24H  . nicotine  14 mg Transdermal Daily  . pantoprazole  40 mg Oral Daily  . sodium chloride flush  10-40 mL Intracatheter Q12H  . sodium chloride flush  3 mL Intravenous Q12H  . spironolactone  12.5 mg Oral Daily   Continuous Infusions: . sodium chloride    . ceFEPime (MAXIPIME) IV 1 g (01/19/18 0507)  . milrinone 0.2505 mcg/kg/min (01/18/18 2300)  . vancomycin 750 mg (01/19/18 0611)   PRN Meds:.sodium chloride, acetaminophen, albuterol, ondansetron (ZOFRAN) IV, sodium chloride flush, sodium chloride flush   SUBJECTIVE: He is feeling better today.  He has not had any further nausea, vomiting or diarrhea.  He did not notice any fever, chills or sweats overnight.  Review of Systems: Review of Systems  Constitutional: Negative for chills, diaphoresis, fever and malaise/fatigue.  Respiratory: Negative for cough.   Cardiovascular: Negative for chest pain.  Gastrointestinal: Negative for abdominal pain, diarrhea, nausea and vomiting.  Genitourinary: Negative for dysuria.  Skin: Negative for rash.  Neurological: Negative for headaches.    Allergies  Allergen  Reactions  . Norflex [Orphenadrine] Swelling  . Orphenadrine Citrate Swelling and Other (See Comments)    Face swelling     OBJECTIVE: Vitals:   01/18/18 1915 01/18/18 2300 01/19/18 0500 01/19/18 0848  BP:    106/74  Pulse:    (!) 102  Resp: 18 (!) 25 18 (!) 29  Temp:    98.8 F (37.1 C)  TempSrc:    Oral  SpO2:    99%  Weight:   63.5 kg   Height:       Body mass index is 21.93 kg/m.  Physical Exam  Constitutional: He is oriented to person, place, and time.  He is resting quietly and comfortably in bed.    HENT:  Mouth/Throat: No oropharyngeal exudate.  Cardiovascular: Normal rate and regular rhythm.  Murmur heard. 1/6 systolic murmur.  Pulmonary/Chest: Effort normal and breath sounds normal.    Abdominal: Soft. He exhibits no distension. There is no tenderness.  Musculoskeletal: He exhibits no edema or tenderness.  Neurological: He is alert and oriented to person, place, and time.  Skin: No rash noted.  Left arm PICC site looks good.  Psychiatric: He has a normal mood and affect.    Lab Results Lab Results  Component Value Date   WBC 11.6 (H) 01/18/2018   HGB 15.0 01/18/2018   HCT 41.4 01/18/2018   MCV 92.2 01/18/2018   PLT 175 01/18/2018    Lab Results  Component Value Date  CREATININE 0.86 01/19/2018   BUN 14 01/19/2018   NA 133 (L) 01/19/2018   K 3.8 01/19/2018   CL 100 01/19/2018   CO2 22 01/19/2018    Lab Results  Component Value Date   ALT 13 01/18/2018   AST 22 01/18/2018   ALKPHOS 78 01/18/2018   BILITOT 1.4 (H) 01/18/2018     Microbiology: Recent Results (from the past 240 hour(s))  Culture, blood (Routine X 2) w Reflex to ID Panel     Status: None (Preliminary result)   Collection Time: 01/17/18  2:40 PM  Result Value Ref Range Status   Specimen Description BLOOD RIGHT ANTECUBITAL  Final   Special Requests   Final    BOTTLES DRAWN AEROBIC AND ANAEROBIC Blood Culture adequate volume   Culture   Final    NO GROWTH < 24  HOURS Performed at [facility], [address] [place], [place] [address]    Report Status PENDING  Incomplete  Culture, blood (Routine X 2) w Reflex to ID Panel     Status: None (Preliminary result)   Collection Time: 01/17/18  2:50 PM  Result Value Ref Range Status   Specimen Description BLOOD BLOOD LEFT FOREARM  Final   Special Requests   Final    BOTTLES DRAWN AEROBIC AND ANAEROBIC Blood Culture adequate volume   Culture   Final    NO GROWTH < 24 HOURS Performed at [facility], [address], [place], [place] [address]    Report Status PENDING  Incomplete  Urine culture     Status: None   Collection Time: 01/18/18  7:21 AM  Result Value Ref Range Status   Specimen Description URINE, CLEAN CATCH  Final   Special Requests NONE  Final   Culture   Final    NO GROWTH Performed at [facility], [address], [place], [place] [address]    Report Status 01/19/2018 FINAL  Final  C difficile quick scan w PCR reflex     Status: None   Collection Time: 01/18/18 11:32 AM  Result Value Ref Range Status   C Diff antigen NEGATIVE NEGATIVE Final   C Diff toxin NEGATIVE NEGATIVE Final   C Diff interpretation No C. difficile detected.  Final    Comment: Performed at [facility], [address], [place], [place] [address]    Cliffton Asters, MD [facility] [phone] pager   [phone] cell 01/19/2018, 11:33 AM

## 2018-01-20 ENCOUNTER — Encounter (HOSPITAL_COMMUNITY)
Admission: EM | Disposition: A | Discharge status: Home Health | Payer: Self-pay | Source: Home / Self Care | Attending: Cardiology

## 2018-01-20 ENCOUNTER — Inpatient Hospital Stay (HOSPITAL_COMMUNITY): Payer: Medicaid Other | Admitting: Physician Assistant

## 2018-01-20 ENCOUNTER — Ambulatory Visit (HOSPITAL_COMMUNITY): Admission: RE | Admit: 2018-01-20 | Payer: Medicaid Other | Source: Ambulatory Visit | Admitting: Dentistry

## 2018-01-20 ENCOUNTER — Inpatient Hospital Stay (HOSPITAL_COMMUNITY): Payer: Medicaid Other | Admitting: Certified Registered"

## 2018-01-20 DIAGNOSIS — I428 Other cardiomyopathies: Secondary | ICD-10-CM

## 2018-01-20 DIAGNOSIS — K047 Periapical abscess without sinus: Secondary | ICD-10-CM

## 2018-01-20 DIAGNOSIS — Z95828 Presence of other vascular implants and grafts: Secondary | ICD-10-CM

## 2018-01-20 DIAGNOSIS — K08499 Partial loss of teeth due to other specified cause, unspecified class: Secondary | ICD-10-CM

## 2018-01-20 DIAGNOSIS — W228XXA Striking against or struck by other objects, initial encounter: Secondary | ICD-10-CM

## 2018-01-20 DIAGNOSIS — Z95810 Presence of automatic (implantable) cardiac defibrillator: Secondary | ICD-10-CM

## 2018-01-20 DIAGNOSIS — S20219A Contusion of unspecified front wall of thorax, initial encounter: Secondary | ICD-10-CM

## 2018-01-20 DIAGNOSIS — Z888 Allergy status to other drugs, medicaments and biological substances status: Secondary | ICD-10-CM

## 2018-01-20 HISTORY — PX: MULTIPLE EXTRACTIONS WITH ALVEOLOPLASTY: SHX5342

## 2018-01-20 LAB — CBC WITH DIFFERENTIAL/PLATELET
ABS IMMATURE GRANULOCYTES: 0 10*3/uL (ref 0.0–0.1)
BASOS ABS: 0.1 10*3/uL (ref 0.0–0.1)
BASOS PCT: 1 %
EOS ABS: 0.5 10*3/uL (ref 0.0–0.7)
Eosinophils Relative: 6 %
HCT: 37.1 % — ABNORMAL LOW (ref 39.0–52.0)
Hemoglobin: 13.1 g/dL (ref 13.0–17.0)
Immature Granulocytes: 0 %
Lymphocytes Relative: 20 %
Lymphs Abs: 1.7 10*3/uL (ref 0.7–4.0)
MCH: 32.4 pg (ref 26.0–34.0)
MCHC: 35.3 g/dL (ref 30.0–36.0)
MCV: 91.8 fL (ref 78.0–100.0)
MONO ABS: 1.1 10*3/uL — AB (ref 0.1–1.0)
MONOS PCT: 12 %
NEUTROS ABS: 5.4 10*3/uL (ref 1.7–7.7)
NEUTROS PCT: 61 %
PLATELETS: 191 10*3/uL (ref 150–400)
RBC: 4.04 MIL/uL — ABNORMAL LOW (ref 4.22–5.81)
RDW: 12.8 % (ref 11.5–15.5)
WBC: 8.7 10*3/uL (ref 4.0–10.5)

## 2018-01-20 LAB — BASIC METABOLIC PANEL
ANION GAP: 9 (ref 5–15)
BUN: 12 mg/dL (ref 6–20)
CALCIUM: 9.4 mg/dL (ref 8.9–10.3)
CO2: 22 mmol/L (ref 22–32)
CREATININE: 0.87 mg/dL (ref 0.61–1.24)
Chloride: 102 mmol/L (ref 98–111)
GLUCOSE: 102 mg/dL — AB (ref 70–99)
Potassium: 4.1 mmol/L (ref 3.5–5.1)
Sodium: 133 mmol/L — ABNORMAL LOW (ref 135–145)

## 2018-01-20 LAB — MAGNESIUM: MAGNESIUM: 2.2 mg/dL (ref 1.7–2.4)

## 2018-01-20 SURGERY — MULTIPLE EXTRACTION WITH ALVEOLOPLASTY
Anesthesia: General

## 2018-01-20 MED ORDER — BUPIVACAINE-EPINEPHRINE 0.5% -1:200000 IJ SOLN
INTRAMUSCULAR | Status: DC | PRN
  Administered 2018-01-20: 3.6 mL

## 2018-01-20 MED ORDER — ACETAMINOPHEN 325 MG PO TABS: 325.0000 mg | ORAL_TABLET | ORAL | Status: DC | PRN

## 2018-01-20 MED ORDER — FENTANYL CITRATE (PF) 250 MCG/5ML IJ SOLN
INTRAMUSCULAR | Status: DC | PRN
  Administered 2018-01-20: 50 ug via INTRAVENOUS
  Administered 2018-01-20: 25 ug via INTRAVENOUS
  Administered 2018-01-20: 50 ug via INTRAVENOUS

## 2018-01-20 MED ORDER — ONDANSETRON HCL 4 MG/2ML IJ SOLN
INTRAMUSCULAR | Status: DC | PRN
  Administered 2018-01-20: 4 mg via INTRAVENOUS

## 2018-01-20 MED ORDER — ADULT MULTIVITAMIN W/MINERALS CH
1.0000 | ORAL_TABLET | Freq: Every day | ORAL | Status: DC
  Administered 2018-01-20 – 2018-01-26 (×7): 1 via ORAL
  Filled 2018-01-20 (×7): qty 1

## 2018-01-20 MED ORDER — ONDANSETRON HCL 4 MG/2ML IJ SOLN
INTRAMUSCULAR | Status: AC
  Filled 2018-01-20: qty 2

## 2018-01-20 MED ORDER — LACTATED RINGERS IV SOLN: INTRAVENOUS | Status: DC

## 2018-01-20 MED ORDER — LIDOCAINE-EPINEPHRINE 2 %-1:100000 IJ SOLN
INTRAMUSCULAR | Status: AC
  Filled 2018-01-20: qty 1.7

## 2018-01-20 MED ORDER — HEMOSTATIC AGENTS (NO CHARGE) OPTIME
TOPICAL | Status: DC | PRN
  Administered 2018-01-20: 1 via TOPICAL

## 2018-01-20 MED ORDER — OXYCODONE HCL 5 MG PO TABS: 5.0000 mg | ORAL_TABLET | Freq: Once | ORAL | Status: DC | PRN

## 2018-01-20 MED ORDER — HYDROCODONE-ACETAMINOPHEN 5-325 MG PO TABS
1.0000 | ORAL_TABLET | Freq: Four times a day (QID) | ORAL | Status: DC | PRN
  Administered 2018-01-20 – 2018-01-26 (×17): 2 via ORAL
  Filled 2018-01-20 (×17): qty 2

## 2018-01-20 MED ORDER — 0.9 % SODIUM CHLORIDE (POUR BTL) OPTIME
TOPICAL | Status: DC | PRN
  Administered 2018-01-20: 1000 mL

## 2018-01-20 MED ORDER — MIDAZOLAM HCL 2 MG/2ML IJ SOLN
INTRAMUSCULAR | Status: AC
  Filled 2018-01-20: qty 2

## 2018-01-20 MED ORDER — CEFAZOLIN SODIUM-DEXTROSE 2-4 GM/100ML-% IV SOLN: 2.0000 g | Freq: Once | INTRAVENOUS | Status: DC

## 2018-01-20 MED ORDER — ACETAMINOPHEN 160 MG/5ML PO SOLN: 325.0000 mg | ORAL | Status: DC | PRN

## 2018-01-20 MED ORDER — ROCURONIUM BROMIDE 10 MG/ML (PF) SYRINGE
PREFILLED_SYRINGE | INTRAVENOUS | Status: DC | PRN
  Administered 2018-01-20: 50 mg via INTRAVENOUS

## 2018-01-20 MED ORDER — PROPOFOL 10 MG/ML IV BOLUS
INTRAVENOUS | Status: AC
  Filled 2018-01-20: qty 40

## 2018-01-20 MED ORDER — LIDOCAINE-EPINEPHRINE 2 %-1:100000 IJ SOLN
INTRAMUSCULAR | Status: DC | PRN
  Administered 2018-01-20: 3.4 mL via INTRADERMAL

## 2018-01-20 MED ORDER — MEPERIDINE HCL 50 MG/ML IJ SOLN: 6.2500 mg | INTRAMUSCULAR | Status: DC | PRN

## 2018-01-20 MED ORDER — PROPOFOL 10 MG/ML IV BOLUS
INTRAVENOUS | Status: DC | PRN
  Administered 2018-01-20: 50 mg via INTRAVENOUS
  Administered 2018-01-20: 30 mg via INTRAVENOUS

## 2018-01-20 MED ORDER — OXYCODONE HCL 5 MG/5ML PO SOLN: 5.0000 mg | Freq: Once | ORAL | Status: DC | PRN

## 2018-01-20 MED ORDER — EPINEPHRINE PF 1 MG/10ML IJ SOSY
PREFILLED_SYRINGE | INTRAMUSCULAR | Status: AC
  Filled 2018-01-20: qty 10

## 2018-01-20 MED ORDER — BUPIVACAINE-EPINEPHRINE (PF) 0.5% -1:200000 IJ SOLN
INTRAMUSCULAR | Status: AC
  Filled 2018-01-20: qty 5.4

## 2018-01-20 MED ORDER — AMOXICILLIN-POT CLAVULANATE 875-125 MG PO TABS
1.0000 | ORAL_TABLET | Freq: Two times a day (BID) | ORAL | Status: AC
  Administered 2018-01-20 – 2018-01-26 (×13): 1 via ORAL
  Filled 2018-01-20 (×13): qty 1

## 2018-01-20 MED ORDER — LACTATED RINGERS IV SOLN
INTRAVENOUS | Status: DC | PRN
  Administered 2018-01-20: 07:00:00 via INTRAVENOUS

## 2018-01-20 MED ORDER — SUGAMMADEX SODIUM 200 MG/2ML IV SOLN
INTRAVENOUS | Status: DC | PRN
  Administered 2018-01-20: 120 mg via INTRAVENOUS

## 2018-01-20 MED ORDER — FENTANYL CITRATE (PF) 250 MCG/5ML IJ SOLN
INTRAMUSCULAR | Status: AC
  Filled 2018-01-20: qty 5

## 2018-01-20 MED ORDER — ROCURONIUM BROMIDE 50 MG/5ML IV SOSY
PREFILLED_SYRINGE | INTRAVENOUS | Status: AC
  Filled 2018-01-20: qty 5

## 2018-01-20 MED ORDER — LIDOCAINE-EPINEPHRINE 2 %-1:100000 IJ SOLN
INTRAMUSCULAR | Status: AC
  Filled 2018-01-20: qty 3.4

## 2018-01-20 MED ORDER — BOOST / RESOURCE BREEZE PO LIQD CUSTOM: 1.0000 | Freq: Three times a day (TID) | ORAL | Status: DC

## 2018-01-20 MED ORDER — FENTANYL CITRATE (PF) 100 MCG/2ML IJ SOLN: 25.0000 ug | INTRAMUSCULAR | Status: DC | PRN

## 2018-01-20 MED ORDER — LIDOCAINE 2% (20 MG/ML) 5 ML SYRINGE
INTRAMUSCULAR | Status: AC
  Filled 2018-01-20: qty 5

## 2018-01-20 MED ORDER — LIDOCAINE 2% (20 MG/ML) 5 ML SYRINGE
INTRAMUSCULAR | Status: DC | PRN
  Administered 2018-01-20: 60 mg via INTRAVENOUS

## 2018-01-20 MED ORDER — ENSURE ENLIVE PO LIQD
237.0000 mL | Freq: Two times a day (BID) | ORAL | Status: DC
  Administered 2018-01-20 – 2018-01-26 (×8): 237 mL via ORAL

## 2018-01-20 MED ORDER — ONDANSETRON HCL 4 MG/2ML IJ SOLN: 4.0000 mg | Freq: Once | INTRAMUSCULAR | Status: DC | PRN

## 2018-01-20 MED ORDER — ESMOLOL HCL 100 MG/10ML IV SOLN
INTRAVENOUS | Status: AC
  Filled 2018-01-20: qty 10

## 2018-01-20 MED ORDER — EPINEPHRINE PF 1 MG/10ML IJ SOSY
PREFILLED_SYRINGE | INTRAMUSCULAR | Status: DC | PRN
  Administered 2018-01-20: 10 ug via INTRAVENOUS
  Administered 2018-01-20: 5 ug via INTRAVENOUS
  Administered 2018-01-20: 10 ug via INTRAVENOUS

## 2018-01-20 MED ORDER — MIDAZOLAM HCL 5 MG/5ML IJ SOLN
INTRAMUSCULAR | Status: DC | PRN
  Administered 2018-01-20: 2 mg via INTRAVENOUS

## 2018-01-20 MED ORDER — FUROSEMIDE 80 MG PO TABS
80.0000 mg | ORAL_TABLET | Freq: Two times a day (BID) | ORAL | Status: DC
  Administered 2018-01-20 – 2018-01-22 (×5): 80 mg via ORAL
  Filled 2018-01-20 (×5): qty 1

## 2018-01-20 SURGICAL SUPPLY — 39 items
ALCOHOL 70% 16 OZ (MISCELLANEOUS) ×2 IMPLANT
ATTRACTOMAT 16X20 MAGNETIC DRP (DRAPES) ×2 IMPLANT
BANDAGE HEMOSTAT MRDH 4X4 STRL (MISCELLANEOUS) IMPLANT
BLADE SURG 15 STRL LF DISP TIS (BLADE) ×2 IMPLANT
BLADE SURG 15 STRL SS (BLADE) ×2
BNDG HEMOSTAT MRDH 4X4 STRL (MISCELLANEOUS)
COVER SURGICAL LIGHT HANDLE (MISCELLANEOUS) ×2 IMPLANT
COVER WAND RF STERILE (DRAPES) ×2 IMPLANT
GAUZE 4X4 16PLY RFD (DISPOSABLE) ×2 IMPLANT
GAUZE PACKING FOLDED 2  STR (GAUZE/BANDAGES/DRESSINGS) ×1
GAUZE PACKING FOLDED 2 STR (GAUZE/BANDAGES/DRESSINGS) ×1 IMPLANT
GAUZE SPONGE 4X4 12PLY STRL (GAUZE/BANDAGES/DRESSINGS) ×2 IMPLANT
GLOVE BIO SURGEON STRL SZ 6.5 (GLOVE) ×2 IMPLANT
GLOVE SURG ORTHO 8.0 STRL STRW (GLOVE) ×2 IMPLANT
GOWN STRL REUS W/ TWL LRG LVL3 (GOWN DISPOSABLE) ×1 IMPLANT
GOWN STRL REUS W/TWL 2XL LVL3 (GOWN DISPOSABLE) ×2 IMPLANT
GOWN STRL REUS W/TWL LRG LVL3 (GOWN DISPOSABLE) ×1
HEMOSTAT SURGICEL 2X14 (HEMOSTASIS) IMPLANT
KIT BASIN OR (CUSTOM PROCEDURE TRAY) ×2 IMPLANT
KIT TURNOVER KIT B (KITS) ×2 IMPLANT
MANIFOLD NEPTUNE WASTE (CANNULA) ×2 IMPLANT
NEEDLE BLUNT 16X1.5 OR ONLY (NEEDLE) IMPLANT
NEEDLE DENTAL 27 LONG (NEEDLE) IMPLANT
NS IRRIG 1000ML POUR BTL (IV SOLUTION) ×2 IMPLANT
PACK EENT II TURBAN DRAPE (CUSTOM PROCEDURE TRAY) ×2 IMPLANT
PAD ARMBOARD 7.5X6 YLW CONV (MISCELLANEOUS) ×2 IMPLANT
SPONGE SURGIFOAM ABS GEL 100 (HEMOSTASIS) IMPLANT
SPONGE SURGIFOAM ABS GEL 12-7 (HEMOSTASIS) IMPLANT
SPONGE SURGIFOAM ABS GEL SZ50 (HEMOSTASIS) IMPLANT
SUCTION FRAZIER HANDLE 10FR (MISCELLANEOUS) ×1
SUCTION TUBE FRAZIER 10FR DISP (MISCELLANEOUS) ×1 IMPLANT
SUT CHROMIC 3 0 PS 2 (SUTURE) ×4 IMPLANT
SUT CHROMIC 4 0 P 3 18 (SUTURE) IMPLANT
SYR 50ML SLIP (SYRINGE) ×2 IMPLANT
TOWEL NATURAL 10PK STERILE (DISPOSABLE) ×2 IMPLANT
TUBE CONNECTING 12X1/4 (SUCTIONS) ×2 IMPLANT
WATER STERILE IRR 1000ML POUR (IV SOLUTION) ×2 IMPLANT
WATER TABLETS ICX (MISCELLANEOUS) ×2 IMPLANT
YANKAUER SUCT BULB TIP NO VENT (SUCTIONS) ×2 IMPLANT

## 2018-01-20 NOTE — Progress Notes (Signed)
[facility]   Active pt with [name] pharmacy services for home milrinone.    [facility] hospital team will follow pt and support home care services at [place] as ordered.  If patient discharges after hours, please call [phone].   Carl Ferguson 01/20/2018, 11:08 AM

## 2018-01-20 NOTE — Transfer of Care (Signed)
Immediate Anesthesia Transfer of Care Note  Patient: Carl Ferguson  Procedure(s) Performed: Extraction of tooth #'s 2 and 29 with alveoloplasty and gross debridement of remaining dentition (N/A )  Patient Location: PACU  Anesthesia Type:General  Level of Consciousness: awake, alert  and oriented  Airway & Oxygen Therapy: Patient Spontanous Breathing and Patient connected to nasal cannula oxygen  Post-op Assessment: Report given to RN and Post -op Vital signs reviewed and stable  Post vital signs: Reviewed and stable  Last Vitals:  Vitals Value Taken Time  BP 132/92 01/20/2018  8:51 AM  Temp    Pulse 111 01/20/2018  8:53 AM  Resp 24 01/20/2018  8:53 AM  SpO2 99 % 01/20/2018  8:53 AM  Vitals shown include unvalidated device data.  Last Pain:  Vitals:   01/20/18 0400  TempSrc:   PainSc: Asleep         Complications: No apparent anesthesia complications

## 2018-01-20 NOTE — Care Management Note (Signed)
Case Management Note  Patient Details  Name: Carl Ferguson MRN: [id number] Date of Birth: 1965/07/20  Subjective/Objective:   Pt admitted for dental extraction - pt is on home milrninone                 Action/Plan:  PTA from home on IV milrinone with [facility] - has HHRN.  Pt readmit and has recently utilized [facility]   Expected Discharge Date:  01/21/18               Expected Discharge Plan:  Home w Home Health Services  In-House Referral:     Discharge planning Services  CM Consult  Post Acute Care Choice:    Choice offered to:     DME Arranged:    DME Agency:     HH Arranged:    HH Agency:     Status of Service:  In process, will continue to follow  If discussed at Long Length of Stay Meetings, dates discussed:    Additional Comments:  Cherylann Parr, RN 01/20/2018, 11:16 AM

## 2018-01-20 NOTE — Progress Notes (Addendum)
Patient ID: Carl Ferguson, male   DOB: 11-Dec-1965, 52 y.o.   MRN: [id number]     Advanced Heart Failure Rounding Note  PCP-Cardiologist: Garwin Brothers, MD   Subjective:    Had multiple extractions today with Dr Kristin Bruins, along with gross debridement of remaining dentition. Continue Vanc and Cefepime. WBC 8.7. Afebrile.   Seen by ID have not found localizing source of infection. C. Diff screen is negative. All cx negative to date  Lasix on hold. Weight up 1 lb. SBP 100-130s  Feels fine today. Denies CP, SOB, orthopnea. Mouth pain well controlled. Wants to know when he can go home.  Objective:   Weight Range: 63.6 kg Body mass index is 21.97 kg/m.   Vital Signs:   Temp:  [97.2 F (36.2 C)-98.8 F (37.1 C)] 97.2 F (36.2 C) (10/07 1017) Pulse Rate:  [88-110] 107 (10/07 1017) Resp:  [16-29] 21 (10/07 1021) BP: (97-141)/(67-92) 107/87 (10/07 1021) SpO2:  [95 %-99 %] 99 % (10/07 1017) Arterial Line BP: (99-124)/(66-74) 111/72 (10/07 1021) Weight:  [63.6 kg] 63.6 kg (10/07 0528) Last BM Date: 01/18/18  Weight change: Filed Weights   01/18/18 0800 01/19/18 0500 01/20/18 0528  Weight: 63.1 kg 63.5 kg 63.6 kg    Intake/Output:   Intake/Output Summary (Last 24 hours) at 01/20/2018 1132 Last data filed at 01/20/2018 1020 Gross per 24 hour  Intake 1330.67 ml  Output 650 ml  Net 680.67 ml      Physical Exam    General:No resp difficulty. Thin. HEENT: Ice packs present. Neck: Supple. JVP 7-8. Carotids 2+ bilat; no bruits. No thyromegaly or nodule noted. Cor: PMI nondisplaced. RRR, +S3, 2/6 MR Lungs: fine crackles in bases. Abdomen: Soft, non-tender, non-distended, no HSM. No bruits or masses. +BS  Extremities: No cyanosis, clubbing, or rash. R and LLE no edema. LU PICC CDI Neuro: Alert & orientedx3, cranial nerves grossly intact. moves all 4 extremities w/o difficulty. Affect pleasant   Telemetry   Vpaced with SR/sinus tach 90-100s. Personally reviewed.    Labs    CBC Recent Labs    01/18/18 0418 01/20/18 0242  WBC 11.6* 8.7  NEUTROABS 9.7* 5.4  HGB 15.0 13.1  HCT 41.4 37.1*  MCV 92.2 91.8  PLT 175 191   Basic Metabolic Panel Recent Labs    86/16/83 0310 01/20/18 0242  NA 133* 133*  K 3.8 4.1  CL 100 102  CO2 22 22  GLUCOSE 109* 102*  BUN 14 12  CREATININE 0.86 0.87  CALCIUM 9.5 9.4  MG  --  2.2   Liver Function Tests Recent Labs    01/18/18 0418  AST 22  ALT 13  ALKPHOS 78  BILITOT 1.4*  PROT 7.7  ALBUMIN 3.7   No results for input(s): LIPASE, AMYLASE in the last 72 hours. Cardiac Enzymes No results for input(s): CKTOTAL, CKMB, CKMBINDEX, TROPONINI in the last 72 hours.  BNP: BNP (last 3 results) Recent Labs    06/13/17 2143 07/02/17 1531 12/30/17 0858  BNP 1,699.9* 532.9* 4,087.0*    ProBNP (last 3 results) No results for input(s): PROBNP in the last 8760 hours.   D-Dimer No results for input(s): DDIMER in the last 72 hours. Hemoglobin A1C No results for input(s): HGBA1C in the last 72 hours. Fasting Lipid Panel No results for input(s): CHOL, HDL, LDLCALC, TRIG, CHOLHDL, LDLDIRECT in the last 72 hours. Thyroid Function Tests No results for input(s): TSH, T4TOTAL, T3FREE, THYROIDAB in the last 72 hours.  Invalid input(s): FREET3  Other results:   Imaging    No results found.   Medications:     Scheduled Medications: . amiodarone  200 mg Oral BID  . aspirin EC  81 mg Oral Daily  . digoxin  0.125 mg Oral Daily  . nicotine  14 mg Transdermal Daily  . pantoprazole  40 mg Oral Daily  . sodium chloride flush  10-40 mL Intracatheter Q12H  . sodium chloride flush  3 mL Intravenous Q12H  . spironolactone  12.5 mg Oral Daily    Infusions: . sodium chloride    . ceFEPime (MAXIPIME) IV 1 g (01/20/18 0439)  . lactated ringers Stopped (01/20/18 1020)  . milrinone 0.25 mcg/kg/min (01/20/18 0735)  . vancomycin 1,000 mg (01/20/18 0557)    PRN Medications: sodium chloride,  acetaminophen, albuterol, HYDROcodone-acetaminophen, ondansetron (ZOFRAN) IV, sodium chloride flush, sodium chloride flush    Assessment/Plan   1. ID: Patient presented with fever/chills/headache. Tm 100.1.  He had blood cultures drawn 10/4 and 10/5. BP soft but lactate normal.  PICC line site and ICD site look benign.  Of note, he had fever recently when he was in the hospital, resolved after removal of PICC line but no definite cause found. TEE last admission without vegetation on valves or ICD wires noted.  UA negative, CXR without PNA.  Now afebrile. Cultures remain negative. C. Diff negative but PCT 9.17 10/5. ID has seen and have not found localizing source - Per ID recs will continue vanc/cefipime pending further culture data. Blood cultures NGTD. - WBC normal 8.7. Remains afebrile.  - I wonder if a tagged WBC scan may be of any utility - Underwent dental extractions and gross debridement with Dr Kristin Bruins today. Pain okay.  2. Chronic Systolic CHF:  Nonischemic cardiomyopathy, diagnosed in 2011, initially followed at [place].  LHC (3/19) with no significant CAD.  CPX (3/19) submaximal, but suggestive of mild to moderate HF limitation. HIV negative 3/19, history of drug/ETOH abuse in the past, no drugs now and had cut back ETOH considerably.  Echo 9/19 showed EF 20% with severe LV dilation, severe MR.  NYHA class IIIb symptoms. He has a wide LBBB.  RHC was done in 9/19 showing low output, CI 1.4.  He was admitted to start milrinone, unable to wean off milrinone and remains on 0.25 mcg/kg/min at home.  Medtronic ICD was placed, he has a His bundle lead but EP was unable to get an LV lead in place => His bundle pacing does not narrow his QRS and did not help [place] wean him off milrinone.  He has been doing well on milrinone 0.25 mcg/kg/min, NYHA class II.  Volume status trending back up.  - Lasix held with concern for sepsis syndrome.  Restart Lasix today at 80 mg po bid.   - Continue digoxin,  level ok.   - Continue home milrinone 0.25 mcg/kg/min.  - Continue spironolactone 12.5 daily.  - Unable to place LV lead as above.  - Not thought to be Mitraclip candidate (see below).  - At this time, his best option for long-term survival will be LVAD placement.  We have discussed this at length with patient and girlfriend. Current plan will be to continue milrinone at home until he has LVAD placed.  He had his teeth out today with Dr. Kristin Bruins.  Plan had been to admit him on 10/11 for RHC/Swan, then LVAD placement on 10/14.  However, all this is contingent on [place] clearing him from an ID standpoint for LVAD surgery.  -  He may be an eventual transplant candidate if he stays off cigarettes.  3. ETOH Abuse: No longer drinking. No change.  4. Tobacco abuse: He has quit smoking since admission in 9/19. No change.  5. Mitral regurgitation: Severe functional MR.  With LV dilated > 7 cm, suspect Mitraclip would be unlikely to help him appreciably.   6. VT: Patient had episode of VT requiring ATP.   - Continue amiodarone 200 mg bid.  - Keep K> 4.0 Mg > 2.0. K 4.1, mag 2.2. - No further VT.   Length of Stay: 1  Alford Highland, NP  01/20/2018, 11:32 AM  Advanced Heart Failure Team Pager [phone] (M-F; 7a - 4p)  Please contact [facility] for night-coverage after hours (4p -7a ) and weekends on [email or web address]  Patient seen with NP, agree with the above note.  WBCs normal, blood cultures negative, afebrile.  He remains on vancomycin/cefepime, uncertain source of fever last week.  Per Dr. Kristin Bruins, he did not have active pus noted during dental extractions but possible fever could have had a dental source.    Mild volume overload today, JVP 8-9 cm.   He feels fine, no dyspnea/orthopnea.   Will await further ID recommendations regarding potential source and plan for IV antibiotics.  ?If there would be utility to a tagged WBC scan.   As of now, if ID issues can be settled, plan had been for him to be  admitted Friday for Swan then LVAD Monday.   Restart home Lasix 80 mg po bid.   Marca Ancona 01/20/2018 12:46 PM

## 2018-01-20 NOTE — Anesthesia Postprocedure Evaluation (Signed)
Anesthesia Post Note  Patient: Carl Ferguson  Procedure(s) Performed: Extraction of tooth #'s 2 and 29 with alveoloplasty and gross debridement of remaining dentition (N/A )     Patient location during evaluation: PACU Anesthesia Type: General Level of consciousness: awake and alert Pain management: pain level controlled Vital Signs Assessment: post-procedure vital signs reviewed and stable Respiratory status: spontaneous breathing, nonlabored ventilation, respiratory function stable and patient connected to nasal cannula oxygen Cardiovascular status: blood pressure returned to baseline and stable Postop Assessment: no apparent nausea or vomiting Anesthetic complications: no    Last Vitals:  Vitals:   01/20/18 0911 01/20/18 0915  BP:    Pulse: (!) 102 (!) 102  Resp: 20 (!) 26  Temp: (!) 36.2 C   SpO2:  97%    Last Pain:  Vitals:   01/20/18 0911  TempSrc:   PainSc: 0-No pain                 Wesam Gearhart

## 2018-01-20 NOTE — Progress Notes (Signed)
PRE-OPERATIVE NOTE:  01/20/2018 Carl Ferguson [id number]  VITALS: BP 100/77 (BP Location: Right Arm)   Pulse 99   Temp 98.1 F (36.7 C) (Oral)   Resp (!) 28   Ht 5\' 7"  (1.702 m)   Wt 63.6 kg   SpO2 97%   BMI 21.97 kg/m   Lab Results  Component Value Date   WBC 8.7 01/20/2018   HGB 13.1 01/20/2018   HCT 37.1 (L) 01/20/2018   MCV 91.8 01/20/2018   PLT 191 01/20/2018   BMET    Component Value Date/Time   NA 133 (L) 01/20/2018 0242   NA 140 12/05/2017 1555   K 4.1 01/20/2018 0242   CL 102 01/20/2018 0242   CO2 22 01/20/2018 0242   GLUCOSE 102 (H) 01/20/2018 0242   BUN 12 01/20/2018 0242   BUN 13 12/05/2017 1555   CREATININE 0.87 01/20/2018 0242   CALCIUM 9.4 01/20/2018 0242   GFRNONAA >60 01/20/2018 0242   GFRAA >60 01/20/2018 0242    Lab Results  Component Value Date   INR 1.15 12/26/2017   INR 1.1 06/24/2017   No results found for: PTT   Carl Ferguson presents for multiple dental extractions with alveoloplasty and gross debridement remaining dentition in the operating room and general anesthesia.     SUBJECTIVE: The patient was recently admitted with a history of fever and diarrhea.  Denies any acute dental changes and agrees to proceed with treatment as planned.  EXAM: No sign of acute dental changes.  ASSESSMENT: Patient is affected by chronic apical periodontitis, dental caries, periodontitis, and accretions.  PLAN: Patient agrees to proceed with treatment as planned in the operating room as previously discussed and accepts the risks, benefits, and complications of the proposed treatment. Patient is aware of the risk for bleeding, bruising, swelling, infection, pain, nerve damage, soft tissue damage, damage to adjacent teeth, sinus involvement, root tip fracture, mandible fracture, and the risks of complications associated with the anesthesia. Patient also is aware of the potential for other complications up to and including death due to his overall  cardiovascular compromise.     Charlynne Pander, DDS

## 2018-01-20 NOTE — Progress Notes (Signed)
[facility]  Date of Admission:  01/18/2018   Total days of antibiotics 4        Day 4 vancomycin         Day 4 cefepime           Patient ID: Carl Ferguson is a 52 y.o. M with  Principal Problem:   Fever Active Problems:   Nonischemic cardiomyopathy (HCC)   ICD (implantable cardioverter-defibrillator) in place   Diarrhea   Flu-like symptoms   . amiodarone  200 mg Oral BID  . aspirin EC  81 mg Oral Daily  . digoxin  0.125 mg Oral Daily  . feeding supplement  1 Container Oral TID BM  . furosemide  80 mg Oral BID  . multivitamin with minerals  1 tablet Oral Daily  . nicotine  14 mg Transdermal Daily  . pantoprazole  40 mg Oral Daily  . sodium chloride flush  10-40 mL Intracatheter Q12H  . sodium chloride flush  3 mL Intravenous Q12H  . spironolactone  12.5 mg Oral Daily    SUBJECTIVE: Just back from OR after extractions. He is in mild pain but overall feels well. Does have occasional headaches but nothing like when he came in. He tells me that prior to admission he had about a 36 hour window of time where he had vomiting and 48h of diarrhea. These have all since resolved and he has not had a BM in 1-2 days.  His PICC line is in place and looks good. No purulence, tenderness, drainage or swelling distally. He did tell me he hit his pacemaker pocket hard with entering a door which caused a bruise but no pain or tenderness now (residual bruise still there).    He previously had a fever to nearly 41 F that resolved with pulling PICC line and 72h of antibiotics a few weeks ago.    Allergies  Allergen Reactions  . Norflex [Orphenadrine] Swelling  . Orphenadrine Citrate Swelling and Other (See Comments)    Face swelling     OBJECTIVE: Vitals:   01/20/18 1130 01/20/18 1150 01/20/18 1200 01/20/18 1230  BP: 108/81 112/71 94/75   Pulse: 98 96 96 97  Resp: (!) 22 (!) 24 (!) 23 (!) 23  Temp:  (!) 97.5 F (36.4 C) 98 F (36.7 C)     TempSrc:  Oral Oral   SpO2: 100% 99% 100% 98%  Weight:      Height:       Body mass index is 21.97 kg/m.  Physical Exam  Constitutional: He is oriented to person, place, and time. He appears well-developed and well-nourished. No distress.  Sitting in bed, comfortable and in no pain/distress  HENT:  Mouth/Throat: No oral lesions. Normal dentition. No dental caries. No oropharyngeal exudate.  Ice packs to b/l jaw following recent extractions.   Eyes: Pupils are equal, round, and reactive to light. No scleral icterus.  Cardiovascular: Normal rate, regular rhythm and normal heart sounds.  Pulmonary/Chest: Effort normal and breath sounds normal.  Abdominal: Soft. He exhibits no distension. There is no tenderness.  Lymphadenopathy:    He has no cervical adenopathy.  Neurological: He is alert and oriented to person, place, and time.  Skin: Skin is warm and dry. No rash noted.  Vitals reviewed.   Lab Results Lab Results  Component Value Date   WBC 8.7 01/20/2018   HGB 13.1 01/20/2018   HCT 37.1 (L) 01/20/2018  MCV 91.8 01/20/2018   PLT 191 01/20/2018    Lab Results  Component Value Date   CREATININE 0.87 01/20/2018   BUN 12 01/20/2018   NA 133 (L) 01/20/2018   K 4.1 01/20/2018   CL 102 01/20/2018   CO2 22 01/20/2018    Lab Results  Component Value Date   ALT 13 01/18/2018   AST 22 01/18/2018   ALKPHOS 78 01/18/2018   BILITOT 1.4 (H) 01/18/2018     Microbiology:   Assessment:  ARKHAM BORROMEO is a 52 y.o. male with fevers of unknown significance now for a second time in the last few weeks. He previously had a fever to nearly 24 F that resolved with pulling PICC line and 72h of antibiotics a few weeks ago. This time he had a prodrome of possible viral GI presentation (nausea, vomiting, diarrhea, headaches, fevers). He is now on day 4 of antibiotics without identified infectious source. WBC initially mildly elevated but normalized now. All micro data is negative  including CDiff quick test. No GI pathogen panel but his GI symptoms have resolved. ?viral contributor.   He did have several teeth removed today in preparation for durable LVAD placement which could also be source of fever for him. He tells me that prior to extraction intermittently he experienced "sour" drainage in his mouth. Op note w/o significant purulence, but would at this point transition him to oral amox-clav and follow.   His PICC line is in place and looks good. No purulence, tenderness, drainage or swelling distally --> OK to leave this for now. He did tell me he hit his pacemaker pocket hard with entering a door which caused a bruise but no pain or tenderness now (residual bruise still there). No fluctuance or collection or opening to incision to suggest infection here.   Would consider keeping him inpatient as we narrow antibiotics for close monitoring to ensure no need to postpone LVAD implant vs home off antibiotics and monitoring of temperatures with implant at later date. Will of course defer to LVAD team. He is schedule for durable VAD on Monday 10/14.   Not certain if tagged WBC scan would be helpful - may over-exaggerate any finding in jaw d/t recent surgery - will d/w Dr. Ninetta Lights about utility.   Assessment:  1. Would place him on amox-clav 875 mg BID for potential dental source  2. Stop vancomycin  3. Stop cefepime  4. Monitor closely   Rexene Alberts, MSN, NP-C [facility] Cell: [phone] Pager: [phone]  01/20/2018  2:02 PM

## 2018-01-20 NOTE — Progress Notes (Signed)
CSW spoke with patient Disability Determination worker, Arley Phenix, who confirms they received all necessary documentation at this time.  Arley Phenix is finalizing patients paperwork and plans to send to MD for medical review today- she will reach out if MD requires further documentation to make a determination.  CSW will continue to follow and assist as needed  Burna Sis, LCSW Clinical Social Worker [phone]

## 2018-01-20 NOTE — Progress Notes (Signed)
CSW met with patient and caregiver at bedside. Patient reports he was readmitted over the weekend and he had a high fever and chills. Patient reports he is feeling better today and had some teeth extracted today in anticipation of LVAD surgery. Patient's caregiver asked questions regarding plan and if patient will be discharged today. CSW referred to medical team for plan. CSW provided supportive intervention and will follow up for pending LVAD surgery. Raquel Sarna, [place], [place]

## 2018-01-20 NOTE — Progress Notes (Signed)
Initial Nutrition Assessment  DOCUMENTATION CODES:   Not applicable  INTERVENTION:   -Ensure Enlive po BID, each supplement provides 350 kcal and 20 grams of protein -MVI with minerals daily  NUTRITION DIAGNOSIS:   Increased nutrient needs related to chronic illness(advanced heart failure) as evidenced by estimated needs.  GOAL:   Patient will meet greater than or equal to 90% of their needs  MONITOR:   PO intake, Supplement acceptance, Diet advancement, Labs, Weight trends, Skin, I & O's  REASON FOR ASSESSMENT:   Consult Assessment of nutrition requirement/status(tooth extractions)  ASSESSMENT:   52 year old man with nonischemic cardiomyopathy, dependent on milrinone, and awaiting VAD placement, who presents with fever.    Pt admitted with fever.   10/7- s/p multiple extraction of tooth numbers 2 and 29 with alveoloplastyand gross debridement of remaining dentition  Pt standing up at sink, washing up and brushing teeth at time of visit.   Pt was on a clear liquid diet, but RD noted that pt was advanced to a soft diet after RD visit.   Pt had decreased appetite prior to prior admission, but improved, as pt was consuming 50-100% of meals after last admission and was also consuming Ensure supplements.  Reviewed wt hx; noted that pt has experienced a 9.7% wt loss over the past 6 months. While this is not significant for time frame, is concerning given history of advanced heart failure and decreased appetite.   Per advanced heart failure team notes, plan to re-admit on Friday with plan for LVAD on Monday, 01/27/18.   Labs reviewed: Na: 133.   NUTRITION - FOCUSED PHYSICAL EXAM:    Diet Order:   Diet Order            DIET SOFT Room service appropriate? Yes; Fluid consistency: Thin  Diet effective now              EDUCATION NEEDS:   No education needs have been identified at this time  Skin:  Skin Assessment: Reviewed RN Assessment  Last BM:   01/18/18  Height:   Ht Readings from Last 1 Encounters:  01/18/18 5\' 7"  (1.702 m)    Weight:   Wt Readings from Last 1 Encounters:  01/20/18 63.6 kg    Ideal Body Weight:  67.3 kg  BMI:  Body mass index is 21.97 kg/m.  Estimated Nutritional Needs:   Kcal:  1900-2100  Protein:  95-110 grams  Fluid:  1.9-2.1 L    Carl Ferguson A. Mayford Knife, RD, LDN, CDE Pager: [phone] After hours Pager: [phone]

## 2018-01-20 NOTE — Anesthesia Procedure Notes (Signed)
Procedure Name: Intubation Date/Time: 01/20/2018 7:51 AM Performed by: Elliot Dally, CRNA Pre-anesthesia Checklist: Patient identified, Emergency Drugs available, Suction available and Patient being monitored Patient Re-evaluated:Patient Re-evaluated prior to induction Oxygen Delivery Method: Circle System Utilized Preoxygenation: Pre-oxygenation with 100% oxygen Induction Type: IV induction Ventilation: Mask ventilation without difficulty Laryngoscope Size: Miller and 2 Grade View: Grade I Tube type: Oral Tube size: 7.5 mm Number of attempts: 1 Airway Equipment and Method: Stylet and Oral airway Placement Confirmation: ETT inserted through vocal cords under direct vision,  positive ETCO2 and breath sounds checked- equal and bilateral Tube secured with: Tape Dental Injury: Teeth and Oropharynx as per pre-operative assessment

## 2018-01-20 NOTE — Op Note (Signed)
OPERATIVE REPORT  Patient:            Carl Ferguson Date of Birth:  02/05/1966 MRN:                [id number]   DATE OF PROCEDURE:  01/20/2018  PREOPERATIVE DIAGNOSES: 1.  Chronic systolic heart failure 2.  Pre-LVAD dental protocol 3.  Chronic apical periodontitis 4.  Dental caries 5.  Chronic periodontitis 6.  Accretions  POSTOPERATIVE DIAGNOSES: 1.  Chronic systolic heart failure 2.  Pre-LVAD dental protocol 3.  Chronic apical periodontitis 4.  Dental caries 5.  Chronic periodontitis 6.  Accretions  OPERATIONS: 1. Multiple extraction of tooth numbers 2 and 29 with alveoloplasty 2. Gross debridement of remaining dentition   SURGEON: Charlynne Pander, DDS  ASSISTANT: Pearletha Alfred (dental assistant)  ANESTHESIA: General anesthesia via oral endotracheal tube.  MEDICATIONS: 1. Cefipime and Vancomycin IV antibiotic therapy per previous orders  2. Local anesthesia with a total utilization of 2 carpules each containing 34 mg of lidocaine with 0.017 mg of epinephrine as well as 2 carpules each containing 9 mg of bupivacaine with 0.009 mg of epinephrine.  SPECIMENS: There are 2 teeth that were discarded.  DRAINS: None  CULTURES: None  COMPLICATIONS: None  ESTIMATED BLOOD LOSS: Less than 50 mLs.  INTRAVENOUS FLUIDS: 500 mLs of Lactated ringers solution.  INDICATIONS: The patient was recently diagnosed with chronic systolic heart failure.  A medically necessary dental consultation was then requested to evaluate poor dentition as part of pre-LVAD dental protocol.  The patient was examined and treatment planned for extractions with alveoloplasty and gross debridement of remaining dentition in the operating room with general anesthesia.  This treatment plan was formulated to decrease the risks and complications associated with dental infection from affecting the patient's systemic health and the anticipated LVAD procedure.  OPERATIVE FINDINGS: Patient was  examined operating room number 3.  The teeth were identified for extraction. The patient was noted be affected by chronic apical periodontitis, dental caries, chronic periodontitis, and accretions.     DESCRIPTION OF PROCEDURE: Patient was brought to the main operating room number 3. Patient was then placed in the supine position on the operating table. General anesthesia was then induced per the anesthesia team. The patient was then prepped and draped in the usual manner for dental medicine procedure. A timeout was performed. The patient was identified and procedures were verified. A throat pack was placed at this time. The oral cavity was then thoroughly examined with the findings noted above. The patient was then ready for dental medicine procedure as follows:  Local anesthesia was then administered sequentially with a total utilization of 2 carpules each containing 34 mg of lidocaine with 0.017 mg of epinephrine as well as 2 carpules  each containing 9 mg bupivacaine with 0.009 mg of epinephrine.  The Maxillary right quadrant was first approached. Anesthesia was then delivered utilizing infiltration with lidocaine with epinephrine. A Woodson elevator was used to remove the soft tissue from the hard tissue.  Tooth #2 was then elevated with a large elevator.  Tooth #2 was then removed with a 150 forceps without complications.  Excessive granulation tissue was then removed.  The surgical site was then irrigated with copious amounts sterile saline.  A piece of Surgifoam was placed the extraction socket.  The surgical site was then closed from the distal of #2 and extended to the distal of #3 utilizing 3-0 chromic gut suture in a continuous interrupted suture technique x1.  An additional interrupted suture was then placed utilizing three 3-0 chromic gut material.    At this point in time, the mandibular right quadrant was approached.  The patient was given a mandibular right inferior alveolar nerve block  and long buccal nerve block utilizing the bupivacaine with epinephrine. Further infiltration was then achieved utilizing the lidocaine with epinephrine. A 15 blade incision was then made from the distal of number 31 extended to the mesial of #29.  A surgical flap was then carefully reflected. Tooth number 29 was elevated with a series straight elevators and removed with a 151 forceps without complications.  The socket was curetted and compressed appropriately.  Alveoloplasty was then performed utilizing a ronguers and bone file to help achieve primary closure.  The tissues were approximated and trimmed appropriately. The surgical sites were then irrigated with copious amounts of sterile saline.  A piece of Surgifoam was placed in the extraction socket.  The surgical site was then closed from the distal of #31 extended to the distal of #28 utilizing 3-0 chromic at suture in a continuous interrupted suture technique x1.    At this point in time, a gross debridement procedure was performed.  A sonic scaler was used to remove accretions.  A series of hand curettes were used to further remove accretions.  The Sonic scaler was then again used to further refine the removal of accretions.  This completed the gross debridement procedure.  At this point in time, the entire mouth was irrigated with copious amounts of sterile saline. The patient was examined for complications, seeing none, the dental medicine procedure was deemed to be complete. The throat pack was removed at this time. A series of 4 x 4 gauze were placed in the mouth to aid hemostasis. The patient was then handed over to the anesthesia team for final disposition. After an appropriate amount of time, the patient was extubated and taken to the postanesthsia care unit in good condition. All counts were correct for the dental medicine procedure.  The Lovenox therapy may be restarted tomorrow if no significant oral bleeding.   Charlynne Pander,  DDS.

## 2018-01-20 NOTE — Plan of Care (Signed)
  Problem: Health Behavior/Discharge Planning: Goal: Ability to manage health-related needs will improve Outcome: Progressing   Problem: Clinical Measurements: Goal: Ability to maintain clinical measurements within normal limits will improve Outcome: Progressing Goal: Will remain free from infection Outcome: Progressing Goal: Diagnostic test results will improve Outcome: Progressing Goal: Respiratory complications will improve Outcome: Progressing Goal: Cardiovascular complication will be avoided Outcome: Progressing   Problem: Education: Goal: Knowledge of General Education information will improve Description Including pain rating scale, medication(s)/side effects and non-pharmacologic comfort measures Outcome: Progressing   Problem: Health Behavior/Discharge Planning: Goal: Ability to manage health-related needs will improve Outcome: Progressing   Problem: Clinical Measurements: Goal: Ability to maintain clinical measurements within normal limits will improve Outcome: Progressing Goal: Will remain free from infection Outcome: Progressing Goal: Diagnostic test results will improve Outcome: Progressing Goal: Respiratory complications will improve Outcome: Progressing Goal: Cardiovascular complication will be avoided Outcome: Progressing   Problem: Activity: Goal: Risk for activity intolerance will decrease Outcome: Progressing   Problem: Nutrition: Goal: Adequate nutrition will be maintained Outcome: Progressing   Problem: Coping: Goal: Level of anxiety will decrease Outcome: Progressing   Problem: Elimination: Goal: Will not experience complications related to bowel motility Outcome: Progressing Goal: Will not experience complications related to urinary retention Outcome: Progressing   Problem: Pain Managment: Goal: General experience of comfort will improve Outcome: Progressing   Problem: Safety: Goal: Ability to remain free from injury will  improve Outcome: Progressing   Problem: Skin Integrity: Goal: Risk for impaired skin integrity will decrease Outcome: Progressing   Problem: Education: Goal: Required Educational Video(s) Outcome: Progressing   Problem: Clinical Measurements: Goal: Postoperative complications will be avoided or minimized Outcome: Progressing   Problem: Skin Integrity: Goal: Demonstration of wound healing without infection will improve Outcome: Progressing

## 2018-01-21 ENCOUNTER — Encounter (HOSPITAL_COMMUNITY): Payer: Self-pay | Admitting: Dentistry

## 2018-01-21 LAB — BASIC METABOLIC PANEL
ANION GAP: 9 (ref 5–15)
BUN: 13 mg/dL (ref 6–20)
CALCIUM: 9.3 mg/dL (ref 8.9–10.3)
CO2: 27 mmol/L (ref 22–32)
Chloride: 99 mmol/L (ref 98–111)
Creatinine, Ser: 0.84 mg/dL (ref 0.61–1.24)
GFR calc Af Amer: >60 mL/min (ref >60)
GFR calc non Af Amer: >60 mL/min (ref >60)
GLUCOSE: 101 mg/dL — AB (ref 70–99)
Potassium: 3.9 mmol/L (ref 3.5–5.1)
Sodium: 135 mmol/L (ref 135–145)

## 2018-01-21 LAB — CBC WITH DIFFERENTIAL/PLATELET
Abs Immature Granulocytes: 0 10*3/uL (ref 0.00–0.07)
Basophils Absolute: 0.1 10*3/uL (ref 0.0–0.1)
Basophils Relative: 1 %
EOS ABS: 0.5 10*3/uL (ref 0.0–0.5)
EOS PCT: 7 %
HEMATOCRIT: 34.6 % — AB (ref 39.0–52.0)
Hemoglobin: 12.2 g/dL — ABNORMAL LOW (ref 13.0–17.0)
IMMATURE GRANULOCYTES: 0 %
LYMPHS ABS: 1.7 10*3/uL (ref 0.7–4.0)
Lymphocytes Relative: 22 %
MCH: 32.7 pg (ref 26.0–34.0)
MCHC: 35.3 g/dL (ref 30.0–36.0)
MCV: 92.8 fL (ref 80.0–100.0)
MONO ABS: 0.7 10*3/uL (ref 0.1–1.0)
MONOS PCT: 9 %
Neutro Abs: 4.7 10*3/uL (ref 1.7–7.7)
Neutrophils Relative %: 61 %
Platelets: 191 10*3/uL (ref 150–400)
RBC: 3.73 MIL/uL — ABNORMAL LOW (ref 4.22–5.81)
RDW: 13 % (ref 11.5–15.5)
WBC: 7.7 10*3/uL (ref 4.0–10.5)

## 2018-01-21 LAB — COOXEMETRY PANEL
CARBOXYHEMOGLOBIN: 1.8 % — AB (ref 0.5–1.5)
Methemoglobin: 0.6 % (ref 0.0–1.5)
O2 Saturation: 75.3 %
Total hemoglobin: 12.1 g/dL (ref 12.0–16.0)

## 2018-01-21 MED ORDER — CHLORHEXIDINE GLUCONATE 0.12 % MT SOLN
15.0000 mL | Freq: Two times a day (BID) | OROMUCOSAL | Status: DC
  Administered 2018-01-21 – 2018-01-27 (×13): 15 mL via OROMUCOSAL
  Filled 2018-01-21 (×12): qty 15

## 2018-01-21 NOTE — Care Management Note (Signed)
Case Management Note  Patient Details  Name: Carl Ferguson MRN: [id number] Date of Birth: January 31, 1966  Subjective/Objective:   Pt admitted for dental extraction - pt is on home milrninone                 Action/Plan:  PTA from home on IV milrinone with [facility] - has HHRN.  Pt readmit and has recently utilized [facility]   Expected Discharge Date:  01/21/18               Expected Discharge Plan:  Home w Home Health Services  In-House Referral:     Discharge planning Services  CM Consult  Post Acute Care Choice:    Choice offered to:     DME Arranged:    DME Agency:     HH Arranged:    HH Agency:     Status of Service:  In process, will continue to follow  If discussed at Long Length of Stay Meetings, dates discussed:    Additional Comments: 01/21/2018 Plan is for pt to have Swan placed 01/24/18.  Tentative LVAD planned for early next week Doretha Goding S, RN 01/21/2018, 11:30 AM

## 2018-01-21 NOTE — Progress Notes (Signed)
1 Day Post-Op Procedure(s) (LRB): Extraction of tooth #'s 2 and 29 with alveoloplasty and gross debridement of remaining dentition (N/A) Subjective: Patient with minimal complaint after having multiple tooth extraction yesterday.  Minimal swelling or bruising of face.  Patient diarrhea fever prior to admission appears to have resolved.  C. difficile toxin is negative. Patient remains very stable on milrinone in preparation for implantable left ventricular assist device October 14. Currently on oral Augmentin Objective: Vital signs in last 24 hours: Temp:  [97.5 F (36.4 C)-98.1 F (36.7 C)] 97.5 F (36.4 C) (10/08 1622) Pulse Rate:  [84-100] 100 (10/08 1148) Cardiac Rhythm: Ventricular paced (10/08 0800) Resp:  [14-25] 21 (10/08 1148) BP: (93-105)/(70-86) 100/80 (10/08 1622) SpO2:  [96 %-98 %] 98 % (10/08 1148) Weight:  [64.9 kg] 64.9 kg (10/08 0328)  Hemodynamic parameters for last 24 hours: CVP:  [7 mmHg-9 mmHg] 9 mmHg  Intake/Output from previous day: 10/07 0701 - 10/08 0700 In: 1331.8 [P.O.:360; I.V.:621.8; IV Piggyback:200] Out: 550 [Urine:500; Blood:50] Intake/Output this shift: Total I/O In: 0  Out: 1 [Stool:1]  Lungs clear Heart rate regular Abdomen soft Minimal edema  Lab Results: Recent Labs    01/20/18 0242 01/21/18 0425  WBC 8.7 7.7  HGB 13.1 12.2*  HCT 37.1* 34.6*  PLT 191 191   BMET:  Recent Labs    01/20/18 0242 01/21/18 0425  NA 133* 135  K 4.1 3.9  CL 102 99  CO2 22 27  GLUCOSE 102* 101*  BUN 12 13  CREATININE 0.87 0.84  CALCIUM 9.4 9.3    PT/INR: No results for input(s): LABPROT, INR in the last 72 hours. ABG    Component Value Date/Time   HCO3 22.6 12/30/2017 0812   HCO3 22.6 12/30/2017 0812   TCO2 24 12/30/2017 0812   TCO2 24 12/30/2017 0812   ACIDBASEDEF 2.0 12/30/2017 0812   ACIDBASEDEF 2.0 12/30/2017 0812   O2SAT 75.3 01/21/2018 0925   CBG (last 3)  No results for input(s): GLUCAP in the last 72  hours.  Assessment/Plan: S/P Procedure(s) (LRB): Extraction of tooth #'s 2 and 29 with alveoloplasty and gross debridement of remaining dentition (N/A) HeartMate 3 implantation planned for next week. Recommend leaving patient in the hospital and repeating right heart cath Thursday or Friday to optimize his right heart function prior to surgery.   LOS: 2 days    Kathlee Nations Trigt III 01/21/2018

## 2018-01-21 NOTE — Progress Notes (Signed)
[facility]  Date of Admission:  01/18/2018   Total days of antibiotics 5        Day 2 amox clav         Patient ID: Carl Ferguson is a 52 y.o. M with  Principal Problem:   Fever Active Problems:   Nonischemic cardiomyopathy (HCC)   ICD (implantable cardioverter-defibrillator) in place   Diarrhea   Flu-like symptoms   . amiodarone  200 mg Oral BID  . amoxicillin-clavulanate  1 tablet Oral Q12H  . aspirin EC  81 mg Oral Daily  . digoxin  0.125 mg Oral Daily  . feeding supplement (ENSURE ENLIVE)  237 mL Oral BID BM  . furosemide  80 mg Oral BID  . multivitamin with minerals  1 tablet Oral Daily  . nicotine  14 mg Transdermal Daily  . pantoprazole  40 mg Oral Daily  . sodium chloride flush  10-40 mL Intracatheter Q12H  . sodium chloride flush  3 mL Intravenous Q12H  . spironolactone  12.5 mg Oral Daily    SUBJECTIVE: Feels fine today. Tells me he is staying here until his LVAD surgery Monday. He has tolerated the augmentin well and w/o side effect. He tells me that he has had some recurrent skin "bumps" that are larger than pimples periodically. Up on chest, back and arms. He has one small one on his right posterior arm now. No pain and he drained it through compression/picking prior to hospital.   Allergies  Allergen Reactions  . Norflex [Orphenadrine] Swelling  . Orphenadrine Citrate Swelling and Other (See Comments)    Face swelling     OBJECTIVE: Vitals:   01/20/18 1926 01/20/18 2354 01/21/18 0328 01/21/18 0746  BP: 104/76 93/70 96/76  103/85  Pulse: 96 84 96   Resp: 16 (!) 25 (!) 23 14  Temp: (!) 97.5 F (36.4 C) (!) 97.5 F (36.4 C) 97.8 F (36.6 C) 97.6 F (36.4 C)  TempSrc: Oral Oral Oral Oral  SpO2: 98% 96% 96% 98%  Weight:   64.9 kg   Height:       Body mass index is 22.41 kg/m.  Physical Exam  Constitutional: He is oriented to person, place, and time. He appears well-developed and well-nourished. No  distress.  Up walking around the room. Appears well.   HENT:  Mouth/Throat: No oral lesions. Normal dentition. No dental caries. No oropharyngeal exudate.  Minimal swelling. Stitches intact.   Eyes: Pupils are equal, round, and reactive to light. No scleral icterus.  Cardiovascular: Normal rate, regular rhythm and normal heart sounds.  Pulmonary/Chest: Effort normal and breath sounds normal.  Abdominal: Soft. He exhibits no distension. There is no tenderness.  Musculoskeletal: Normal range of motion.  Lymphadenopathy:    He has no cervical adenopathy.  Neurological: He is alert and oriented to person, place, and time.  Skin: Skin is warm and dry. No rash noted.     Vitals reviewed.   Lab Results Lab Results  Component Value Date   WBC 7.7 01/21/2018   HGB 12.2 (L) 01/21/2018   HCT 34.6 (L) 01/21/2018   MCV 92.8 01/21/2018   PLT 191 01/21/2018    Lab Results  Component Value Date   CREATININE 0.84 01/21/2018   BUN 13 01/21/2018   NA 135 01/21/2018   K 3.9 01/21/2018   CL 99 01/21/2018   CO2 27 01/21/2018    Lab Results  Component Value Date  ALT 13 01/18/2018   AST 22 01/18/2018   ALKPHOS 78 01/18/2018   BILITOT 1.4 (H) 01/18/2018     Microbiology: BCx 10/04 >> no growth  Urine Cx 10/05 >> no growth  CDiff Quick Scan 10/05 >> neg   Assessment:  Carl Ferguson is a 52 y.o. male with fevers of unknown significance awaiting durable LVAD placement. Seems that he gets some frequent simple skin infections that resolve with warmth/compression. He is not a staph aureus carrier per MRSA/MSSA collected 12/2017. Doing well on augmentin and remains afebrile with normal wbc count. Would continue for 7-10d following extractions. He will remain admitted until his LVAD placement scheduled Monday and planning RHC Friday of this week.    Assessment:  1. Continue amox-clav  2. Follow temp and wbc curve   Rexene Alberts, MSN, NP-C [facility] Cell: [phone] Pager: [phone]  01/21/2018  9:17 AM

## 2018-01-21 NOTE — Progress Notes (Addendum)
Patient ID: Carl Ferguson, male   DOB: 17-Mar-1966, 52 y.o.   MRN: [id number]     Advanced Heart Failure Rounding Note  PCP-Cardiologist: Garwin Brothers, MD   Subjective:    [name] he was restarted on lasix.   Denies SOB.   Objective:   Weight Range: 64.9 kg Body mass index is 22.41 kg/m.   Vital Signs:   Temp:  [97.2 F (36.2 C)-98.2 F (36.8 C)] 97.6 F (36.4 C) (10/08 0746) Pulse Rate:  [84-110] 96 (10/08 0328) Resp:  [14-30] 14 (10/08 0746) BP: (93-141)/(70-92) 103/85 (10/08 0746) SpO2:  [95 %-100 %] 98 % (10/08 0746) Arterial Line BP: (99-134)/(66-94) 128/94 (10/07 1230) Weight:  [64.9 kg] 64.9 kg (10/08 0328) Last BM Date: 01/18/18  Weight change: Filed Weights   01/19/18 0500 01/20/18 0528 01/21/18 0328  Weight: 63.5 kg 63.6 kg 64.9 kg    Intake/Output:   Intake/Output Summary (Last 24 hours) at 01/21/2018 0756 Last data filed at 01/21/2018 0400 Gross per 24 hour  Intake 1331.75 ml  Output 550 ml  Net 781.75 ml      Physical Exam    General:  Well appearing. No resp difficulty. Sitting in the chair HEENT: normal Neck: supple. no JVD. Carotids 2+ bilat; no bruits. No lymphadenopathy or thryomegaly appreciated. Cor: PMI nondisplaced. Regular rate & rhythm. No rubs,  2/6 MR . +S3  Lungs: clear Abdomen: soft, nontender, nondistended. No hepatosplenomegaly. No bruits or masses. Good bowel sounds. Extremities: no cyanosis, clubbing, rash, edema. LUE PICC  Neuro: alert & orientedx3, cranial nerves grossly intact. moves all 4 extremities w/o difficulty. Affect pleasant   Telemetry  V paced 80-90s  Labs    CBC Recent Labs    01/20/18 0242 01/21/18 0425  WBC 8.7 7.7  NEUTROABS 5.4 4.7  HGB 13.1 12.2*  HCT 37.1* 34.6*  MCV 91.8 92.8  PLT 191 191   Basic Metabolic Panel Recent Labs    40/98/11 0242 01/21/18 0425  NA 133* 135  K 4.1 3.9  CL 102 99  CO2 22 27  GLUCOSE 102* 101*  BUN 12 13  CREATININE 0.87 0.84  CALCIUM 9.4 9.3  MG  2.2  --    Liver Function Tests No results for input(s): AST, ALT, ALKPHOS, BILITOT, PROT, ALBUMIN in the last 72 hours. No results for input(s): LIPASE, AMYLASE in the last 72 hours. Cardiac Enzymes No results for input(s): CKTOTAL, CKMB, CKMBINDEX, TROPONINI in the last 72 hours.  BNP: BNP (last 3 results) Recent Labs    06/13/17 2143 07/02/17 1531 12/30/17 0858  BNP 1,699.9* 532.9* 4,087.0*    ProBNP (last 3 results) No results for input(s): PROBNP in the last 8760 hours.   D-Dimer No results for input(s): DDIMER in the last 72 hours. Hemoglobin A1C No results for input(s): HGBA1C in the last 72 hours. Fasting Lipid Panel No results for input(s): CHOL, HDL, LDLCALC, TRIG, CHOLHDL, LDLDIRECT in the last 72 hours. Thyroid Function Tests No results for input(s): TSH, T4TOTAL, T3FREE, THYROIDAB in the last 72 hours.  Invalid input(s): FREET3  Other results:   Imaging    No results found.   Medications:     Scheduled Medications: . amiodarone  200 mg Oral BID  . amoxicillin-clavulanate  1 tablet Oral Q12H  . aspirin EC  81 mg Oral Daily  . digoxin  0.125 mg Oral Daily  . feeding supplement (ENSURE ENLIVE)  237 mL Oral BID BM  . furosemide  80 mg Oral BID  .  multivitamin with minerals  1 tablet Oral Daily  . nicotine  14 mg Transdermal Daily  . pantoprazole  40 mg Oral Daily  . sodium chloride flush  10-40 mL Intracatheter Q12H  . sodium chloride flush  3 mL Intravenous Q12H  . spironolactone  12.5 mg Oral Daily    Infusions: . sodium chloride    . lactated ringers Stopped (01/20/18 1020)  . milrinone 0.25 mcg/kg/min (01/21/18 0019)    PRN Medications: sodium chloride, acetaminophen, albuterol, HYDROcodone-acetaminophen, ondansetron (ZOFRAN) IV, sodium chloride flush, sodium chloride flush    Assessment/Plan   1. ID: Patient presented with fever/chills/headache. Tm 100.1.  He had blood cultures drawn 10/4 and 10/5. BP soft but lactate normal.   PICC line site and ICD site look benign.  Of note, he had fever recently when he was in the hospital, resolved after removal of PICC line but no definite cause found. TEE last admission without vegetation on valves or ICD wires noted.  UA negative, CXR without PNA.  Now afebrile. Cultures remain negative. C. Diff negative but PCT 9.17 10/5. ID has seen and have not found definite localizing source.  Underwent dental extractions and gross debridement with Dr Kristin Bruins. - Per ID recs will continue Augmentin for possible dental infection pending further culture data. Blood cultures NGTD. - WBC normal 7.7 . Remains afebrile.  2. Chronic Systolic CHF:  Nonischemic cardiomyopathy, diagnosed in 2011, initially followed at [place].  LHC (3/19) with no significant CAD.  CPX (3/19) submaximal, but suggestive of mild to moderate HF limitation. HIV negative 3/19, history of drug/ETOH abuse in the past, no drugs now and had cut back ETOH considerably.  Echo 9/19 showed EF 20% with severe LV dilation, severe MR.  NYHA class IIIb symptoms. He has a wide LBBB.  RHC was done in 9/19 showing low output, CI 1.4.  He was admitted to start milrinone, unable to wean off milrinone and remains on 0.25 mcg/kg/min at home.  [employer] ICD was placed, he has a His bundle lead but EP was unable to get an LV lead in place => His bundle pacing does not narrow his QRS and did not help [place] wean him off milrinone.  He has been doing well on milrinone 0.25 mcg/kg/min, NYHA class II.   - Volume status stable. Continue Lasix today at 80 mg po bid.  Set up CVP and check CO-OX - Continue digoxin, level ok.   - Continue home milrinone 0.25 mcg/kg/min.  - Continue spironolactone 12.5 daily.  - Unable to place LV lead as above.  - Not thought to be Mitraclip candidate (see below).  - At this time, his best option for long-term survival will be LVAD placement.  We have discussed this at length with patient and girlfriend. Current plan will be to  continue milrinone at home until he has LVAD placed.  He had his teeth out today with Dr. Kristin Bruins.  Plan had been to admit him on 10/11 for RHC/Swan, then LVAD placement on 10/14.  However, all this is contingent on [place] clearing him from an ID standpoint for LVAD surgery.  - He may be an eventual transplant candidate if he stays off cigarettes.  3. ETOH Abuse: No longer drinking. No change.  4. Tobacco abuse: He has quit smoking since admission in 9/19. No change.  5. Mitral regurgitation: Severe functional MR.  With LV dilated > 7 cm, suspect Mitraclip would be unlikely to help him appreciably.   6. VT: Patient had episode of  VT requiring ATP.   - Continue amiodarone 200 mg bid.  - Keep K> 4.0 Mg > 2.0.  K 3.9  - No further VT.  Plan to keep in the hospital until VAD.  Will place [facility] Friday, plan for implant on Monday.    Length of Stay: 2  Tonye Becket, NP  01/21/2018, 7:56 AM  Advanced Heart Failure Team Pager [phone] (M-F; 7a - 4p)  Please contact [facility] for night-coverage after hours (4p -7a ) and weekends on [email or web address]  Patient seen with NP, agree with the above note.  Stable today, normal WBCs and no fever.  He was transitioned to po Augmentin yesterday, now off IV abx.  No definite source for infection, PICC line looks ok. Possible dental infection, s/p dental extractions Monday.  - Continue to observe on po Augmentin.   Stable on milrinone today, not significantly volume overloaded on exam.  Will send co-ox today and follow CVP.  He is back on his home po Lasix.   Plan for Swan placement Friday if no further fever and LVAD on Monday.  May be best plan to keep him in hospital until that time.    Marca Ancona 01/21/2018 8:21 AM

## 2018-01-21 NOTE — Progress Notes (Signed)
POST OPERATIVE NOTE:  01/21/2018 Carl Ferguson [id number]  VITALS: BP 105/86   Pulse 100   Temp 98.1 F (36.7 C) (Oral)   Resp (!) 21   Ht 5\' 7"  (1.702 m)   Wt 64.9 kg   SpO2 98%   BMI 22.41 kg/m   LABS:  Lab Results  Component Value Date   WBC 7.7 01/21/2018   HGB 12.2 (L) 01/21/2018   HCT 34.6 (L) 01/21/2018   MCV 92.8 01/21/2018   PLT 191 01/21/2018   BMET    Component Value Date/Time   NA 135 01/21/2018 0425   NA 140 12/05/2017 1555   K 3.9 01/21/2018 0425   CL 99 01/21/2018 0425   CO2 27 01/21/2018 0425   GLUCOSE 101 (H) 01/21/2018 0425   BUN 13 01/21/2018 0425   BUN 13 12/05/2017 1555   CREATININE 0.84 01/21/2018 0425   CALCIUM 9.3 01/21/2018 0425   GFRNONAA >60 01/21/2018 0425   GFRAA >60 01/21/2018 0425    Lab Results  Component Value Date   INR 1.15 12/26/2017   INR 1.1 06/24/2017   No results found for: PTT   Carl Ferguson is status post multiple extractions with alveoloplasty and gross debridement of remaining dentition in the operating room with general anesthesia.  SUBJECTIVE: Patient denies having significant discomfort from dental extraction sites.  Patient denies any active bleeding.  Patient indicates that he is being kept hospitalized at this time to allow for catheterization on Friday and surgery on Monday.  EXAM: No sign of oral infection, heme, or ooze.  Sutures are intact.  Clots are present.   ASSESSMENT: Post operative course is consistent with dental procedures performed in the operating room with general anesthesia. Loss of teeth due to extraction  PLAN: 1.  Use chlorhexidine rinses twice daily to aid in this infection of the oral cavity. 2.  Advance diet as tolerated.  Appreciate nutritional consultation. 3.  Patient is cleared for LVAD surgery on Monday.   Charlynne Pander, DDS

## 2018-01-21 NOTE — Progress Notes (Signed)
CSW met with patient at bedside and updated on SSI progress.  Patient reports that he just moved homes yesterday and this needs to be updated with disability worker- CSW left message for disability determination worker to discuss address change.  Patient reports being a little frustrated by having to stay in the hospital till his surgery but understands that is probably what is best for him to avoid any more complications.  CSW will continue to follow as needed  Jorge Ny, [place] Social Worker [phone]

## 2018-01-22 DIAGNOSIS — I5043 Acute on chronic combined systolic (congestive) and diastolic (congestive) heart failure: Secondary | ICD-10-CM

## 2018-01-22 DIAGNOSIS — K08109 Complete loss of teeth, unspecified cause, unspecified class: Secondary | ICD-10-CM

## 2018-01-22 LAB — BASIC METABOLIC PANEL
Anion gap: 8 (ref 5–15)
BUN: 10 mg/dL (ref 6–20)
CHLORIDE: 96 mmol/L — AB (ref 98–111)
CO2: 28 mmol/L (ref 22–32)
Calcium: 9.4 mg/dL (ref 8.9–10.3)
Creatinine, Ser: 0.8 mg/dL (ref 0.61–1.24)
GFR calc Af Amer: >60 mL/min (ref >60)
GFR calc non Af Amer: >60 mL/min (ref >60)
Glucose, Bld: 93 mg/dL (ref 70–99)
Potassium: 3.8 mmol/L (ref 3.5–5.1)
SODIUM: 132 mmol/L — AB (ref 135–145)

## 2018-01-22 LAB — CBC WITH DIFFERENTIAL/PLATELET
Abs Immature Granulocytes: 0.02 10*3/uL (ref 0.00–0.07)
BASOS PCT: 1 %
Basophils Absolute: 0 10*3/uL (ref 0.0–0.1)
Eosinophils Absolute: 0.4 10*3/uL (ref 0.0–0.5)
Eosinophils Relative: 5 %
HCT: 33.7 % — ABNORMAL LOW (ref 39.0–52.0)
HEMOGLOBIN: 11.7 g/dL — AB (ref 13.0–17.0)
Immature Granulocytes: 0 %
LYMPHS PCT: 20 %
Lymphs Abs: 1.7 10*3/uL (ref 0.7–4.0)
MCH: 31.7 pg (ref 26.0–34.0)
MCHC: 34.7 g/dL (ref 30.0–36.0)
MCV: 91.3 fL (ref 80.0–100.0)
MONO ABS: 0.7 10*3/uL (ref 0.1–1.0)
Monocytes Relative: 9 %
Neutro Abs: 5.4 10*3/uL (ref 1.7–7.7)
Neutrophils Relative %: 65 %
Platelets: 212 10*3/uL (ref 150–400)
RBC: 3.69 MIL/uL — AB (ref 4.22–5.81)
RDW: 12.8 % (ref 11.5–15.5)
WBC: 8.2 10*3/uL (ref 4.0–10.5)
nRBC: 0 % (ref 0.0–0.2)

## 2018-01-22 LAB — CULTURE, BLOOD (ROUTINE X 2)
CULTURE: NO GROWTH
CULTURE: NO GROWTH
SPECIAL REQUESTS: ADEQUATE
Special Requests: ADEQUATE

## 2018-01-22 LAB — COOXEMETRY PANEL
CARBOXYHEMOGLOBIN: 1.4 % (ref 0.5–1.5)
METHEMOGLOBIN: 1.4 % (ref 0.0–1.5)
O2 Saturation: 77.7 %
TOTAL HEMOGLOBIN: 11.9 g/dL — AB (ref 12.0–16.0)

## 2018-01-22 MED ORDER — FUROSEMIDE 10 MG/ML IJ SOLN
80.0000 mg | Freq: Once | INTRAMUSCULAR | Status: AC
  Administered 2018-01-22: 80 mg via INTRAVENOUS
  Filled 2018-01-22: qty 8

## 2018-01-22 MED ORDER — FUROSEMIDE 80 MG PO TABS
80.0000 mg | ORAL_TABLET | Freq: Two times a day (BID) | ORAL | Status: DC
  Administered 2018-01-23 – 2018-01-26 (×8): 80 mg via ORAL
  Filled 2018-01-22 (×8): qty 1

## 2018-01-22 NOTE — Progress Notes (Signed)
[facility]  Date of Admission:  01/18/2018   Total days of antibiotics 6        Day 3 amox clav         Patient ID: Carl Ferguson is a 52 y.o. M with  Principal Problem:   Fever Active Problems:   Nonischemic cardiomyopathy (HCC)   ICD (implantable cardioverter-defibrillator) in place   Diarrhea   Flu-like symptoms   . amiodarone  200 mg Oral BID  . amoxicillin-clavulanate  1 tablet Oral Q12H  . aspirin EC  81 mg Oral Daily  . chlorhexidine  15 mL Mouth/Throat BID  . digoxin  0.125 mg Oral Daily  . feeding supplement (ENSURE ENLIVE)  237 mL Oral BID BM  . furosemide  80 mg Intravenous Once  . [START ON 01/23/2018] furosemide  80 mg Oral BID  . multivitamin with minerals  1 tablet Oral Daily  . nicotine  14 mg Transdermal Daily  . pantoprazole  40 mg Oral Daily  . sodium chloride flush  10-40 mL Intracatheter Q12H  . sodium chloride flush  3 mL Intravenous Q12H  . spironolactone  12.5 mg Oral Daily    SUBJECTIVE: Feels great. Eating soft foods well. Teeth pain is minimal. Tolerating antibiotic well. No further fevers/chills. Would love to go outside and walk around.   Allergies  Allergen Reactions  . Norflex [Orphenadrine] Swelling  . Orphenadrine Citrate Swelling and Other (See Comments)    Face swelling     OBJECTIVE: Vitals:   01/21/18 1951 01/21/18 2335 01/22/18 0423 01/22/18 0825  BP: 104/79 108/81 94/69 105/83  Pulse: 99 82    Resp: 19 16 17    Temp: 97.7 F (36.5 C) 97.7 F (36.5 C) 97.9 F (36.6 C) (!) 97.3 F (36.3 C)  TempSrc: Oral Oral Oral Oral  SpO2: 100%     Weight:   64.1 kg   Height:       Body mass index is 22.15 kg/m.  Physical Exam  Constitutional: He is oriented to person, place, and time. He appears well-developed and well-nourished.  Resting comfortably in bed eating breakfast  HENT:  Mouth/Throat: Oropharynx is clear and moist and mucous membranes are normal. Normal dentition. No dental  abscesses. No oropharyngeal exudate.  Cardiovascular: Normal rate, regular rhythm and normal heart sounds.  Pulmonary/Chest: Effort normal and breath sounds normal.  Abdominal: Soft. He exhibits no distension. There is no tenderness.  Lymphadenopathy:    He has no cervical adenopathy.  Neurological: He is alert and oriented to person, place, and time.  Skin: Skin is warm and dry. No rash noted.  Psychiatric: He has a normal mood and affect. Judgment normal.  In good spirits     Lab Results Lab Results  Component Value Date   WBC 8.2 01/22/2018   HGB 11.7 (L) 01/22/2018   HCT 33.7 (L) 01/22/2018   MCV 91.3 01/22/2018   PLT 212 01/22/2018    Lab Results  Component Value Date   CREATININE 0.80 01/22/2018   BUN 10 01/22/2018   NA 132 (L) 01/22/2018   K 3.8 01/22/2018   CL 96 (L) 01/22/2018   CO2 28 01/22/2018    Lab Results  Component Value Date   ALT 13 01/18/2018   AST 22 01/18/2018   ALKPHOS 78 01/18/2018   BILITOT 1.4 (H) 01/18/2018     Microbiology: BCx 10/04 >> no growth  Urine Cx 10/05 >> no growth  CDiff Quick Scan 10/05 >> neg   Assessment:  Carl Ferguson is a 52 y.o. male with fevers of unknown significance awaiting durable LVAD placement. Since removing his teeth he has had no further fevers (on antibiotics). Would not explain the diarrhea/vomiting but he could have had a viral illness in this setting as well. He is improved and doing well. Planned swan ganz catheter placement Friday and LVAD Monday.    Assessment:  1. Continue augmentin x 7d (Sunday).  2. Peri-op antibiotics per LVAD team.   We are available as needed and happy to see Mr. Saladin back should something change.   Rexene Alberts, MSN, NP-C [facility] Cell: [phone] Pager: [phone]  01/22/2018  9:32 AM

## 2018-01-22 NOTE — Progress Notes (Signed)
Nutrition Follow-up  DOCUMENTATION CODES:   Not applicable  INTERVENTION:   -Continue Ensure Enlive po BID, each supplement provides 350 kcal and 20 grams of protein -Continue MVI with minerals daily   NUTRITION DIAGNOSIS:   Increased nutrient needs related to chronic illness(advanced heart failure) as evidenced by estimated needs.  Ongoing  GOAL:   Patient will meet greater than or equal to 90% of their needs  Progressing  MONITOR:   PO intake, Supplement acceptance, Diet advancement, Labs, Weight trends, Skin, I & O's  REASON FOR ASSESSMENT:   Consult Assessment of nutrition requirement/status(tooth extractions)  ASSESSMENT:   52 year old man with nonischemic cardiomyopathy, dependent on milrinone, and awaiting VAD placement, who presents with fever.    10/7- s/p multiple extraction of tooth numbers2 and 29 with alveoloplastyand gross debridement of remaining dentition  Pt receiving nursing care at time of visit.   Pt with good appetite and tolerating po's well. Noted meal completion 100%. Pt consuming Ensure supplements per MAR.  Per advanced heart failure notes, plan to place swan on Friday for hemodynamic optimization and LVAD on Monday, 01/27/18.   Labs reviewed: Na: 132.   Diet Order:   Diet Order            DIET SOFT Room service appropriate? Yes; Fluid consistency: Thin  Diet effective now              EDUCATION NEEDS:   No education needs have been identified at this time  Skin:  Skin Assessment: Reviewed RN Assessment  Last BM:  01/21/18  Height:   Ht Readings from Last 1 Encounters:  01/18/18 5\' 7"  (1.702 m)    Weight:   Wt Readings from Last 1 Encounters:  01/22/18 64.1 kg    Ideal Body Weight:  67.3 kg  BMI:  Body mass index is 22.15 kg/m.  Estimated Nutritional Needs:   Kcal:  1900-2100  Protein:  95-110 grams  Fluid:  1.9-2.1 L    Dedra Matsuo A. Mayford Knife, RD, LDN, CDE Pager: [phone] After hours Pager: [phone]

## 2018-01-22 NOTE — Progress Notes (Signed)
CSW spoke with patient's disability determination worker and provided her with patient's new address- address changed in their system  CSW will continue to follow and assist as needed  Burna Sis, LCSW Clinical Social Worker [phone]

## 2018-01-22 NOTE — Progress Notes (Addendum)
Patient ID: Carl Ferguson, male   DOB: 22-Apr-1965, 52 y.o.   MRN: [id number]     Advanced Heart Failure Rounding Note  PCP-Cardiologist: Garwin Brothers, MD   Subjective:    Remains on milrinone 0.25 mcg/kg/min. CO-OX 77%.   Short of breath at times, but generally feeling good.   Objective:   Weight Range: 64.1 kg Body mass index is 22.15 kg/m.   Vital Signs:   Temp:  [97.5 F (36.4 C)-98.1 F (36.7 C)] 97.9 F (36.6 C) (10/09 0423) Pulse Rate:  [82-100] 82 (10/08 2335) Resp:  [16-21] 17 (10/09 0423) BP: (94-108)/(69-86) 94/69 (10/09 0423) SpO2:  [98 %-100 %] 100 % (10/08 1951) Weight:  [64.1 kg] 64.1 kg (10/09 0423) Last BM Date: 01/21/18  Weight change: Filed Weights   01/20/18 0528 01/21/18 0328 01/22/18 0423  Weight: 63.6 kg 64.9 kg 64.1 kg    Intake/Output:   Intake/Output Summary (Last 24 hours) at 01/22/2018 0810 Last data filed at 01/22/2018 0500 Gross per 24 hour  Intake 84.83 ml  Output 901 ml  Net -816.17 ml      Physical Exam    General:  Well appearing. No resp difficulty. In bed.  HEENT: normal Neck: supple. JVD 8 cm. Carotids 2+ bilat; no bruits. No lymphadenopathy or thryomegaly appreciated. Cor: PMI nondisplaced. Regular rate & rhythm. No rubs, gallops or murmurs. Lungs: clear Abdomen: soft, nontender, nondistended. No hepatosplenomegaly. No bruits or masses. Good bowel sounds. Extremities: no cyanosis, clubbing, rash, edema. LUE PICC Neuro: alert & orientedx3, cranial nerves grossly intact. moves all 4 extremities w/o difficulty. Affect pleasant    Telemetry  V paced 80-90s  Labs    CBC Recent Labs    01/21/18 0425 01/22/18 0427  WBC 7.7 8.2  NEUTROABS 4.7 5.4  HGB 12.2* 11.7*  HCT 34.6* 33.7*  MCV 92.8 91.3  PLT 191 212   Basic Metabolic Panel Recent Labs    62/37/62 0242 01/21/18 0425 01/22/18 0427  NA 133* 135 132*  K 4.1 3.9 3.8  CL 102 99 96*  CO2 22 27 28   GLUCOSE 102* 101* 93  BUN 12 13 10   CREATININE  0.87 0.84 0.80  CALCIUM 9.4 9.3 9.4  MG 2.2  --   --    Liver Function Tests No results for input(s): AST, ALT, ALKPHOS, BILITOT, PROT, ALBUMIN in the last 72 hours. No results for input(s): LIPASE, AMYLASE in the last 72 hours. Cardiac Enzymes No results for input(s): CKTOTAL, CKMB, CKMBINDEX, TROPONINI in the last 72 hours.  BNP: BNP (last 3 results) Recent Labs    06/13/17 2143 07/02/17 1531 12/30/17 0858  BNP 1,699.9* 532.9* 4,087.0*    ProBNP (last 3 results) No results for input(s): PROBNP in the last 8760 hours.   D-Dimer No results for input(s): DDIMER in the last 72 hours. Hemoglobin A1C No results for input(s): HGBA1C in the last 72 hours. Fasting Lipid Panel No results for input(s): CHOL, HDL, LDLCALC, TRIG, CHOLHDL, LDLDIRECT in the last 72 hours. Thyroid Function Tests No results for input(s): TSH, T4TOTAL, T3FREE, THYROIDAB in the last 72 hours.  Invalid input(s): FREET3  Other results:   Imaging    No results found.   Medications:     Scheduled Medications: . amiodarone  200 mg Oral BID  . amoxicillin-clavulanate  1 tablet Oral Q12H  . aspirin EC  81 mg Oral Daily  . chlorhexidine  15 mL Mouth/Throat BID  . digoxin  0.125 mg Oral Daily  . feeding  supplement (ENSURE ENLIVE)  237 mL Oral BID BM  . furosemide  80 mg Oral BID  . multivitamin with minerals  1 tablet Oral Daily  . nicotine  14 mg Transdermal Daily  . pantoprazole  40 mg Oral Daily  . sodium chloride flush  10-40 mL Intracatheter Q12H  . sodium chloride flush  3 mL Intravenous Q12H  . spironolactone  12.5 mg Oral Daily    Infusions: . sodium chloride    . lactated ringers Stopped (01/20/18 1020)  . milrinone 0.25 mcg/kg/min (01/22/18 0500)    PRN Medications: sodium chloride, acetaminophen, albuterol, HYDROcodone-acetaminophen, ondansetron (ZOFRAN) IV, sodium chloride flush, sodium chloride flush    Assessment/Plan   1. ID: Patient presented with  fever/chills/headache. Tm 100.1.  He had blood cultures drawn 10/4 and 10/5. BP soft but lactate normal.  PICC line site and ICD site look benign.  Of note, he had fever recently when he was in the hospital, resolved after removal of PICC line but no definite cause found. TEE last admission without vegetation on valves or ICD wires noted.  UA negative, CXR without PNA.  Now afebrile. Cultures remain negative. C. Diff negative but PCT 9.17 10/5. ID has seen and have not found definite localizing source.  Underwent dental extractions and gross debridement with Dr Kristin Bruins. PICC line site and ICD site look benign.  - Per ID recs will continue Augmentin for possible dental infection pending further culture data.  - Blood cultures NGTD. - WBC 8.2 .  2. Chronic Systolic CHF:  Nonischemic cardiomyopathy, diagnosed in 2011, initially followed at [place].  LHC (3/19) with no significant CAD.  CPX (3/19) submaximal, but suggestive of mild to moderate HF limitation. HIV negative 3/19, history of drug/ETOH abuse in the past, no drugs now and had cut back ETOH considerably.  Echo 9/19 showed EF 20% with severe LV dilation, severe MR.  NYHA class IIIb symptoms. He has a wide LBBB.  RHC was done in 9/19 showing low output, CI 1.4.  He was admitted to start milrinone, unable to wean off milrinone and remains on 0.25 mcg/kg/min at home.  Medtronic ICD was placed, he has a His bundle lead but EP was unable to get an LV lead in place => His bundle pacing does not narrow his QRS and did not help [place] wean him off milrinone.  He has been doing well on milrinone 0.25 mcg/kg/min, NYHA class II.  CVP line not working this morning but suspect mild volume overload by exam. Co-ox 78%.  - Will replace 1 dose po Lasix with IV, then continue on Lasix 80 mg po bid.  - Continue digoxin, level ok.   - Continue home milrinone 0.25 mcg/kg/min.  - Continue spironolactone 12.5 daily.  - Unable to place LV lead as above.  - Not thought to  be Mitraclip candidate (see below).  - At this time, his best option for long-term survival will be LVAD placement.  We have discussed this at length with patient and girlfriend. Current plan will be to continue milrinone at home until he has LVAD placed.  He has had dental extractions with Dr. Kristin Bruins.  We will plan RHC with Swan Friday to optimize him for LVAD Monday.  Will keep in hospital until then to monitor for infectious complications.  - He may be an eventual transplant candidate if he stays off cigarettes.  3. ETOH Abuse: No longer drinking. No change.  4. Tobacco abuse: He has quit smoking since admission in 9/19.  No change.  5. Mitral regurgitation: Severe functional MR.  With LV dilated > 7 cm, suspect Mitraclip would be unlikely to help him appreciably.   6. VT: Patient had episode of VT requiring ATP.   - Continue amiodarone 200 mg bid.  - Keep K> 4.0 Mg > 2.0.  K 3.8  - No further VT.  Plan to keep in the hospital until VAD.  Will place [facility] Friday, plan for implant on Monday.  Swan placement Friday.  He will go to [facility] swan.  Length of Stay: 3  Amy Clegg, NP  01/22/2018, 8:10 AM  Advanced Heart Failure Team Pager [phone] (M-F; 7a - 4p)  Please contact [facility] for night-coverage after hours (4p -7a ) and weekends on [email or web address]  Patient seen with NP, agree with the above note.  He is stable today, no fever and WBCs normal.  He remains on Augmentin for possible dental infection.  No other source of infection/fever has been found.  ID has been following.   Suspect mild volume overload on exam today.  Good co-ox.  - Continue current milrinone.  - As above, will give 1 dose of IV Lasix then continue po.  - Need to get CVP line working, discussed with nurse.   We will keep him in hospital through LVAD on Monday.  Will place [facility] Friday for hemodynamic optimization.   Marca Ancona 01/22/2018 9:09 AM

## 2018-01-23 LAB — CULTURE, BLOOD (ROUTINE X 2)
CULTURE: NO GROWTH
CULTURE: NO GROWTH
Special Requests: ADEQUATE

## 2018-01-23 LAB — BASIC METABOLIC PANEL
ANION GAP: 9 (ref 5–15)
BUN: 11 mg/dL (ref 6–20)
CHLORIDE: 97 mmol/L — AB (ref 98–111)
CO2: 29 mmol/L (ref 22–32)
CREATININE: 0.92 mg/dL (ref 0.61–1.24)
Calcium: 9.6 mg/dL (ref 8.9–10.3)
GFR calc non Af Amer: >60 mL/min (ref >60)
Glucose, Bld: 107 mg/dL — ABNORMAL HIGH (ref 70–99)
Potassium: 3.9 mmol/L (ref 3.5–5.1)
Sodium: 135 mmol/L (ref 135–145)

## 2018-01-23 LAB — CBC WITH DIFFERENTIAL/PLATELET
ABS IMMATURE GRANULOCYTES: 0.05 10*3/uL (ref 0.00–0.07)
BASOS PCT: 0 %
Basophils Absolute: 0 10*3/uL (ref 0.0–0.1)
Eosinophils Absolute: 0.4 10*3/uL (ref 0.0–0.5)
Eosinophils Relative: 5 %
HCT: 36.3 % — ABNORMAL LOW (ref 39.0–52.0)
Hemoglobin: 12.7 g/dL — ABNORMAL LOW (ref 13.0–17.0)
Immature Granulocytes: 1 %
LYMPHS PCT: 21 %
Lymphs Abs: 2 10*3/uL (ref 0.7–4.0)
MCH: 31.8 pg (ref 26.0–34.0)
MCHC: 35 g/dL (ref 30.0–36.0)
MCV: 90.8 fL (ref 80.0–100.0)
MONO ABS: 0.8 10*3/uL (ref 0.1–1.0)
Monocytes Relative: 8 %
NEUTROS PCT: 65 %
NRBC: 0 % (ref 0.0–0.2)
Neutro Abs: 6.1 10*3/uL (ref 1.7–7.7)
PLATELETS: 238 10*3/uL (ref 150–400)
RBC: 4 MIL/uL — AB (ref 4.22–5.81)
RDW: 12.7 % (ref 11.5–15.5)
WBC: 9.4 10*3/uL (ref 4.0–10.5)

## 2018-01-23 LAB — COOXEMETRY PANEL
Carboxyhemoglobin: 1.6 % — ABNORMAL HIGH (ref 0.5–1.5)
Methemoglobin: 0.8 % (ref 0.0–1.5)
O2 SAT: 97.6 %
Total hemoglobin: 12.9 g/dL (ref 12.0–16.0)

## 2018-01-23 NOTE — Progress Notes (Signed)
CARDIAC REHAB PHASE I   PRE:  Rate/Rhythm: 93 dual pacing    BP: sitting 103/81    SaO2:   MODE:  Ambulation: 1020 ft total, 890 ft in 6 min   POST:  Rate/Rhythm: 97 pacing     BP: sitting 107/86     SaO2:    Pt ambulated independently pushing IV pole. No c/o. Discussed sternal precautions, mobility, IS (2000 mL). Good reception. He likes to be independent. Will f/u as low priority as he is walking independently. [phone]  Harriet Masson CES, ACSM 01/23/2018 2:09 PM

## 2018-01-23 NOTE — Progress Notes (Signed)
CSW met with patient at bedside along with primary caregiver and extended family. Patient spoke about upcoming surgery and states he is ready. CSW spoke of the events the day of LVAD implant and support for family. Patient's caregiver Carl Ferguson understanding and will follow up with CSW on implant day. She stated the "whole family will be here". Patient appears to be in good spirits and motivated for improved health. Due to multiple family members present at bedside and patient reports good spirits and motivation for surgery CSW will hold off on completing PHQ-9 until post implant. CSW will follow up throughout implant hospitalization. Carl Ferguson, [place], [place]

## 2018-01-23 NOTE — Progress Notes (Addendum)
Patient ID: Samson P Baum, male   DOB: 08/18/1965, 52 y.o.   MRN: [id number]     Advanced Heart Failure Rounding Note  PCP-Cardiologist: Rajan R Revankar, MD   Subjective:    Coox 97.6% (falsely high) on milrinone 0.25 mcg/kg/min.  CVP ~ 8 on my personal check.   Feeling OK this am. Denies lightheadedness or dizziness. Walking halls.  Objective:   Weight Range: 63.6 kg Body mass index is 21.96 kg/m.   Vital Signs:   Temp:  [97.3 F (36.3 C)-98.3 F (36.8 C)] 97.6 F (36.4 C) (10/10 0347) Pulse Rate:  [88-95] 88 (10/10 0347) Resp:  [14-23] 23 (10/10 0347) BP: (92-119)/(76-87) 119/87 (10/10 0347) SpO2:  [98 %-99 %] 98 % (10/10 0347) Weight:  [63.6 kg] 63.6 kg (10/10 0609) Last BM Date: 01/21/18  Weight change: Filed Weights   01/21/18 0328 01/22/18 0423 01/23/18 0609  Weight: 64.9 kg 64.1 kg 63.6 kg   Intake/Output:   Intake/Output Summary (Last 24 hours) at 01/23/2018 0800 Last data filed at 01/23/2018 0348 Gross per 24 hour  Intake 90.41 ml  Output 1800 ml  Net -1709.59 ml    Physical Exam    General: NAD  HEENT: Normal Neck: Supple. JVP ~7-8 cm. Carotids 2+ bilat; no bruits. No thyromegaly or nodule noted. Cor: PMI nondisplaced. RRR, No M/G/R noted Lungs: CTAB, normal effort. Abdomen: Soft, non-tender, non-distended, no HSM. No bruits or masses. +BS  Extremities: No cyanosis, clubbing, or rash. No edema. LUE PICC Neuro: Alert & orientedx3, cranial nerves grossly intact. moves all 4 extremities w/o difficulty. Affect pleasant   Telemetry   V paced 80-90s, personally reviewed.   Labs    CBC Recent Labs    01/21/18 0425 01/22/18 0427  WBC 7.7 8.2  NEUTROABS 4.7 5.4  HGB 12.2* 11.7*  HCT 34.6* 33.7*  MCV 92.8 91.3  PLT 191 212   Basic Metabolic Panel Recent Labs    01/22/18 0427 01/23/18 0558  NA 132* 135  K 3.8 3.9  CL 96* 97*  CO2 28 29  GLUCOSE 93 107*  BUN 10 11  CREATININE 0.80 0.92  CALCIUM 9.4 9.6   Liver Function  Tests No results for input(s): AST, ALT, ALKPHOS, BILITOT, PROT, ALBUMIN in the last 72 hours. No results for input(s): LIPASE, AMYLASE in the last 72 hours. Cardiac Enzymes No results for input(s): CKTOTAL, CKMB, CKMBINDEX, TROPONINI in the last 72 hours.  BNP: BNP (last 3 results) Recent Labs    06/13/17 2143 07/02/17 1531 12/30/17 0858  BNP 1,699.9* 532.9* 4,087.0*    ProBNP (last 3 results) No results for input(s): PROBNP in the last 8760 hours.   D-Dimer No results for input(s): DDIMER in the last 72 hours. Hemoglobin A1C No results for input(s): HGBA1C in the last 72 hours. Fasting Lipid Panel No results for input(s): CHOL, HDL, LDLCALC, TRIG, CHOLHDL, LDLDIRECT in the last 72 hours. Thyroid Function Tests No results for input(s): TSH, T4TOTAL, T3FREE, THYROIDAB in the last 72 hours.  Invalid input(s): FREET3  Other results:   Imaging    No results found.   Medications:     Scheduled Medications: . amiodarone  200 mg Oral BID  . amoxicillin-clavulanate  1 tablet Oral Q12H  . aspirin EC  81 mg Oral Daily  . chlorhexidine  15 mL Mouth/Throat BID  . digoxin  0.125 mg Oral Daily  . feeding supplement (ENSURE ENLIVE)  237 mL Oral BID BM  . furosemide  80 mg Oral BID  .   multivitamin with minerals  1 tablet Oral Daily  . nicotine  14 mg Transdermal Daily  . pantoprazole  40 mg Oral Daily  . sodium chloride flush  10-40 mL Intracatheter Q12H  . sodium chloride flush  3 mL Intravenous Q12H  . spironolactone  12.5 mg Oral Daily    Infusions: . sodium chloride    . lactated ringers Stopped (01/20/18 1020)  . milrinone 0.25 mcg/kg/min (01/22/18 1636)    PRN Medications: sodium chloride, acetaminophen, albuterol, HYDROcodone-acetaminophen, ondansetron (ZOFRAN) IV, sodium chloride flush, sodium chloride flush  Assessment/Plan   1. ID: Patient presented with fever/chills/headache. Tm 100.1.  He had blood cultures drawn 10/4 and 10/5. BP soft but lactate  normal.  PICC line site and ICD site look benign.  Of note, he had fever recently when he was in the hospital, resolved after removal of PICC line but no definite cause found. TEE last admission without vegetation on valves or ICD wires noted.  UA negative, CXR without PNA.  Now afebrile. Cultures remain negative. C. Diff negative but PCT 9.17 10/5. ID has seen and have not found definite localizing source.  Underwent dental extractions and gross debridement with Dr Kulinski. PICC line site and ICD site look benign.  - Per ID recs will continue Augmentin for possible dental infection x 7 days.  - Blood cultures NG x 5 days. - WBC 8.2 yesterday.   2. Chronic Systolic CHF:  Nonischemic cardiomyopathy, diagnosed in 2011, initially followed at [place]/[facility].  LHC (3/19) with no significant CAD.  CPX (3/19) submaximal, but suggestive of mild to moderate HF limitation. HIV negative 3/19, history of drug/ETOH abuse in the past, no drugs now and had cut back ETOH considerably.  Echo 9/19 showed EF 20% with severe LV dilation, severe MR.  NYHA class IV symptoms. He has a wide LBBB.  RHC was done in 9/19 showing low output, CI 1.4.  He was admitted to start milrinone, unable to wean off milrinone and remains on 0.25 mcg/kg/min at home.  Medtronic ICD was placed, he has a His bundle lead but EP was unable to get an LV lead in place => His bundle pacing does not narrow his QRS and did not help us wean him off milrinone.  He has been doing well on milrinone 0.25 mcg/kg/min, NYHA class II.  CVP line not working this morning but suspect mild volume overload by exam.  - Coox 97% (Falsely high). Continue milrinone 0.25 leading to VAD.  - Continue lasix 80 mg po BID.   - Continue digoxin, level ok.   - Continue spironolactone 12.5 daily.  - Unable to place LV lead as above.  - Not thought to be Mitraclip candidate (see below).  - At this time, his best option for long-term survival will be LVAD placement.  We have discussed  this at length with patient and girlfriend. Current plan will be to continue milrinone at home until he has LVAD placed.  He has had dental extractions with Dr. Kulinski.   - Plan RHC tomorrow to optimize him for LVAD Monday.  Will keep in hospital until then to monitor for infectious complications.  - He may be an eventual transplant candidate if he stays off cigarettes.  3. ETOH Abuse:  - No longer drinking. No change.   4. Tobacco abuse:  - He has quit smoking since admission in 9/19. Will encouraged continued cessation.  5. Mitral regurgitation:  - Severe functional MR.  With LV dilated > 7 cm, suspect   Mitraclip would be unlikely to help him appreciably.  No change for now.  6. VT: Patient had episode of VT requiring ATP.   - Continue amiodarone 200 mg bid.  - Keep K> 4.0 Mg > 2.0.  - K 3.9.  - No further VT.  Plan to keep in the hospital until VAD.  Will place Swan Friday, plan for implant on Monday.  Swan placement Tomorrow. Plan for LVAD Monday, 01/27/18.   Length of Stay: 4  Michael Andrew Tillery, PA-C  01/23/2018, 8:00 AM  Advanced Heart Failure Team Pager [phone] (M-F; 7a - 4p)  Please contact [facility] for night-coverage after hours (4p -7a ) and weekends on [email or web address]  Patient seen with PA, agree with the above note.    He is stable today on milrinone (NYHA class IV prior to milrinone), diuresed more with IV Lasix yesterday and CVP 8 today.  Would continue po Lasix 80 mg bid.    Will plan RHC tomorrow.  Discussed risks/benefits with patient and he agrees to procedure.  Will leave Swan in if significantly abnormal numbers are present, otherwise will remove (limit infection risk).   He remains afebrile.  Continue Augmentin for dental infection x 7 days.  ID has signed off.   Plan for LVAD on Monday. Will keep until then.   Bentlie Withem 01/23/2018 8:48 AM   

## 2018-01-23 NOTE — Progress Notes (Signed)
VAD Criteria Note:   Carl Ferguson has been discussed with the VAD Medical Review board on 01/17/18. The team feels as if the patient is a good candidate for Destination LVAD therapy. The patient meets criteria for a LVAD implant as listed below:  1)  Has failed to respond to optimal medical management (including beta-blockers and ACE inhibitors if tolerated) for at least 45 of the last 60 days, or have been balloon dependent for 7 days, or IV inotrope dependent for 14 days; and,       *On Inotropes 12/30/17 started Milrinone  2)  Has a left ventricular ejection fraction (LVEF) < 25% and, have demonstrated functional limitation with a peak oxygen consumption of <14 ml/kg/min unless balloon pump or inotrope dependent or physically unable to perform the test.         *EF 15-20%  By echo (date)  01/13/18        3)  Social work and palliative care evaluations demonstrate appropriate support system in place for discharge to home with a VAD and that end of life discussions have taken place. Both services have expressed no concern regarding patient's candidacy.         *Social work consult (01/08/18): Tammy Sours        *Palliative Care Consult (01/05/18): Vinie Sill  4)  Primary caretaker identified that can be taught along with the patient how to manage        the VAD equipment.        *Name: Margaret  5)  Deemed appropriate by our financial coordinator: Cecilio Asper        Prior approval: Discussed with Cecilio Asper and Marge Duncans from [facility] financial-approval given for implant 01/23/18. Pt has no insurance.   6)  VAD Coordinator, Judson Roch has met with patient and caregiver, shown them the VAD equipment and discussed with the patient and caregiver about lifestyle changes necessary for success on mechanical circulatory device.        *Met with Richardean Sale on 01/23/18.       *Consent for VAD Evaluation/Caregiver Agreement/HIPPA Release/Photo Release signed on 01/03/18.  7)  Patient has  been discussed with [facility] (Dr.) and felt to be an appropriate candidate.    8)  Intermacs profile: 3  INTERMACS 1: Critical cardiogenic shock describes a patient who is "crashing and burning", in which a patient has life-threatening hypotension and rapidly escalating inotropic pressor support, with critical organ hypoperfusion often confirmed by worsening acidosis and lactate levels.  INTERMACS 2: Progressive decline describes a patient who has been demonstrated "dependent" on inotropic support but nonetheless shows signs of continuing deterioration in nutrition, renal function, fluid retention, or other major status indicator. Patient profile 2 can also describe a patient with refractory volume overload, perhaps with evidence of impaired perfusion, in whom inotropic infusions cannot be maintained due to tachyarrhythmias, clinical ischemia, or other intolerance.  INTERMACS 3: Stable but inotrope dependent describes a patient who is clinically stable on mild-moderate doses of intravenous inotropes (or has a temporary circulatory support device) after repeated documentation of failure to wean without symptomatic hypotension, worsening symptoms, or progressive organ dysfunction (usually renal). It is critical to monitor nutrition, renal function, fluid balance, and overall status carefully in order to distinguish between a  patient who is truly stable at Patient Profile 3 and a patient who has unappreciated decline rendering them Patient Profile 2. This patient may be either at home or in the hospital.   INTERMACS 4: Resting symptoms  describes a patient who is at home on oral therapy but frequently has symptoms of congestion at rest or with activities of daily living (ADL). He or she may have orthopnea, shortness of breath during ADL such as dressing or bathing, gastrointestinal symptoms (abdominal discomfort, nausea, poor appetite), disabling ascites or severe lower extremity edema. This patient  should be carefully considered for more intensive management and surveillance programs, which may in some cases, reveal poor compliance that would compromise outcomes with any therapy.  .  INTERMACS 5: Exertion Intolerant describes a patient who is comfortable at rest but unable to engage in any activity, living predominantly within the house or housebound. This patient has no congestive symptoms, but may have chronically elevated volume status, frequently with renal dysfunction, and may be characterized as exercise intolerant.   INTERMACS 6: Exertion Limited also describes a patient who is comfortable at rest without evidence of fluid overload, but who is able to do some mild activity. Activities of daily living are comfortable and minor activities outside the home such as visiting friends or going to a restaurant can be performed, but fatigue results within a few minutes of any meaningful physical exertion. This patient has occasional episodes of worsening symptoms and is likely to have had a hospitalization for heart failure within the past year.   INTERMACS 7: Advanced NYHA Class 3 describes a patient who is clinically stable with a reasonable level of comfortable activity, despite history of previous decompensation that is not recent. This patient is usually able to walk more than a block. Any decompensation requiring intravenous diuretics or hospitalization within the previous month should make this person a Patient Profile 6 or lower.    9)  NYHA Class: IV  Tanda Rockers RN, BSN VAD Coordinator 24/7 Pager [phone]

## 2018-01-23 NOTE — H&P (View-Only) (Signed)
Patient ID: Carl Ferguson, male   DOB: [birth year], 52 y.o.   MRN: [id number]     Advanced Heart Failure Rounding Note  PCP-Cardiologist: Garwin Brothers, MD   Subjective:    Coox 97.6% (falsely high) on milrinone 0.25 mcg/kg/min.  CVP ~ 8 on my personal check.   Feeling OK this am. Denies lightheadedness or dizziness. Walking halls.  Objective:   Weight Range: 63.6 kg Body mass index is 21.96 kg/m.   Vital Signs:   Temp:  [97.3 F (36.3 C)-98.3 F (36.8 C)] 97.6 F (36.4 C) (10/10 0347) Pulse Rate:  [88-95] 88 (10/10 0347) Resp:  [14-23] 23 (10/10 0347) BP: (92-119)/(76-87) 119/87 (10/10 0347) SpO2:  [98 %-99 %] 98 % (10/10 0347) Weight:  [63.6 kg] 63.6 kg (10/10 0609) Last BM Date: 01/21/18  Weight change: Filed Weights   01/21/18 0328 01/22/18 0423 01/23/18 0609  Weight: 64.9 kg 64.1 kg 63.6 kg   Intake/Output:   Intake/Output Summary (Last 24 hours) at 01/23/2018 0800 Last data filed at 01/23/2018 0348 Gross per 24 hour  Intake 90.41 ml  Output 1800 ml  Net -1709.59 ml    Physical Exam    General: NAD  HEENT: Normal Neck: Supple. JVP ~7-8 cm. Carotids 2+ bilat; no bruits. No thyromegaly or nodule noted. Cor: PMI nondisplaced. RRR, No M/G/R noted Lungs: CTAB, normal effort. Abdomen: Soft, non-tender, non-distended, no HSM. No bruits or masses. +BS  Extremities: No cyanosis, clubbing, or rash. No edema. LUE PICC Neuro: Alert & orientedx3, cranial nerves grossly intact. moves all 4 extremities w/o difficulty. Affect pleasant   Telemetry   V paced 80-90s, personally reviewed.   Labs    CBC Recent Labs    01/21/18 0425 01/22/18 0427  WBC 7.7 8.2  NEUTROABS 4.7 5.4  HGB 12.2* 11.7*  HCT 34.6* 33.7*  MCV 92.8 91.3  PLT 191 212   Basic Metabolic Panel Recent Labs    40/98/11 0427 01/23/18 0558  NA 132* 135  K 3.8 3.9  CL 96* 97*  CO2 28 29  GLUCOSE 93 107*  BUN 10 11  CREATININE 0.80 0.92  CALCIUM 9.4 9.6   Liver Function  Tests No results for input(s): AST, ALT, ALKPHOS, BILITOT, PROT, ALBUMIN in the last 72 hours. No results for input(s): LIPASE, AMYLASE in the last 72 hours. Cardiac Enzymes No results for input(s): CKTOTAL, CKMB, CKMBINDEX, TROPONINI in the last 72 hours.  BNP: BNP (last 3 results) Recent Labs    06/13/17 2143 07/02/17 1531 12/30/17 0858  BNP 1,699.9* 532.9* 4,087.0*    ProBNP (last 3 results) No results for input(s): PROBNP in the last 8760 hours.   D-Dimer No results for input(s): DDIMER in the last 72 hours. Hemoglobin A1C No results for input(s): HGBA1C in the last 72 hours. Fasting Lipid Panel No results for input(s): CHOL, HDL, LDLCALC, TRIG, CHOLHDL, LDLDIRECT in the last 72 hours. Thyroid Function Tests No results for input(s): TSH, T4TOTAL, T3FREE, THYROIDAB in the last 72 hours.  Invalid input(s): FREET3  Other results:   Imaging    No results found.   Medications:     Scheduled Medications: . amiodarone  200 mg Oral BID  . amoxicillin-clavulanate  1 tablet Oral Q12H  . aspirin EC  81 mg Oral Daily  . chlorhexidine  15 mL Mouth/Throat BID  . digoxin  0.125 mg Oral Daily  . feeding supplement (ENSURE ENLIVE)  237 mL Oral BID BM  . furosemide  80 mg Oral BID  .  multivitamin with minerals  1 tablet Oral Daily  . nicotine  14 mg Transdermal Daily  . pantoprazole  40 mg Oral Daily  . sodium chloride flush  10-40 mL Intracatheter Q12H  . sodium chloride flush  3 mL Intravenous Q12H  . spironolactone  12.5 mg Oral Daily    Infusions: . sodium chloride    . lactated ringers Stopped (01/20/18 1020)  . milrinone 0.25 mcg/kg/min (01/22/18 1636)    PRN Medications: sodium chloride, acetaminophen, albuterol, HYDROcodone-acetaminophen, ondansetron (ZOFRAN) IV, sodium chloride flush, sodium chloride flush  Assessment/Plan   1. ID: Patient presented with fever/chills/headache. Tm 100.1.  He had blood cultures drawn 10/4 and 10/5. BP soft but lactate  normal.  PICC line site and ICD site look benign.  Of note, he had fever recently when he was in the hospital, resolved after removal of PICC line but no definite cause found. TEE last admission without vegetation on valves or ICD wires noted.  UA negative, CXR without PNA.  Now afebrile. Cultures remain negative. C. Diff negative but PCT 9.17 10/5. ID has seen and have not found definite localizing source.  Underwent dental extractions and gross debridement with Dr Kristin Bruins. PICC line site and ICD site look benign.  - Per ID recs will continue Augmentin for possible dental infection x 7 days.  - Blood cultures NG x 5 days. - WBC 8.2 yesterday.   2. Chronic Systolic CHF:  Nonischemic cardiomyopathy, diagnosed in 2011, initially followed at [place]/[facility].  LHC (3/19) with no significant CAD.  CPX (3/19) submaximal, but suggestive of mild to moderate HF limitation. HIV negative 3/19, history of drug/ETOH abuse in the past, no drugs now and had cut back ETOH considerably.  Echo 9/19 showed EF 20% with severe LV dilation, severe MR.  NYHA class IV symptoms. He has a wide LBBB.  RHC was done in 9/19 showing low output, CI 1.4.  He was admitted to start milrinone, unable to wean off milrinone and remains on 0.25 mcg/kg/min at home.  Medtronic ICD was placed, he has a His bundle lead but EP was unable to get an LV lead in place => His bundle pacing does not narrow his QRS and did not help [place] wean him off milrinone.  He has been doing well on milrinone 0.25 mcg/kg/min, NYHA class II.  CVP line not working this morning but suspect mild volume overload by exam.  - Coox 97% (Falsely high). Continue milrinone 0.25 leading to VAD.  - Continue lasix 80 mg po BID.   - Continue digoxin, level ok.   - Continue spironolactone 12.5 daily.  - Unable to place LV lead as above.  - Not thought to be Mitraclip candidate (see below).  - At this time, his best option for long-term survival will be LVAD placement.  We have discussed  this at length with patient and girlfriend. Current plan will be to continue milrinone at home until he has LVAD placed.  He has had dental extractions with Dr. Kristin Bruins.   - Plan RHC tomorrow to optimize him for LVAD Monday.  Will keep in hospital until then to monitor for infectious complications.  - He may be an eventual transplant candidate if he stays off cigarettes.  3. ETOH Abuse:  - No longer drinking. No change.   4. Tobacco abuse:  - He has quit smoking since admission in 9/19. Will encouraged continued cessation.  5. Mitral regurgitation:  - Severe functional MR.  With LV dilated > 7 cm, suspect  Mitraclip would be unlikely to help him appreciably.  No change for now.  6. VT: Patient had episode of VT requiring ATP.   - Continue amiodarone 200 mg bid.  - Keep K> 4.0 Mg > 2.0.  - K 3.9.  - No further VT.  Plan to keep in the hospital until VAD.  Will place [facility] Friday, plan for implant on Monday.  Swan Archivist. Plan for LVAD Monday, 01/27/18.   Length of Stay: 4  Graciella Freer, [place]  01/23/2018, 8:00 AM  Advanced Heart Failure Team Pager [phone] (M-F; 7a - 4p)  Please contact [facility] for night-coverage after hours (4p -7a ) and weekends on [email or web address]  Patient seen with PA, agree with the above note.    He is stable today on milrinone (NYHA class IV prior to milrinone), diuresed more with IV Lasix yesterday and CVP 8 today.  Would continue po Lasix 80 mg bid.    Will plan RHC tomorrow.  Discussed risks/benefits with patient and he agrees to procedure.  Will leave Swan in if significantly abnormal numbers are present, otherwise will remove (limit infection risk).   He remains afebrile.  Continue Augmentin for dental infection x 7 days.  ID has signed off.   Plan for LVAD on Monday. Will keep until then.   Marca Ancona 01/23/2018 8:48 AM

## 2018-01-24 ENCOUNTER — Encounter (HOSPITAL_COMMUNITY)
Admission: EM | Disposition: A | Discharge status: Home Health | Payer: Self-pay | Source: Home / Self Care | Attending: Cardiology

## 2018-01-24 ENCOUNTER — Encounter (HOSPITAL_COMMUNITY): Payer: Self-pay | Admitting: Cardiology

## 2018-01-24 ENCOUNTER — Inpatient Hospital Stay (HOSPITAL_COMMUNITY): Admission: RE | Admit: 2018-01-24 | Payer: Medicaid Other | Source: Ambulatory Visit | Admitting: Cardiology

## 2018-01-24 DIAGNOSIS — I509 Heart failure, unspecified: Secondary | ICD-10-CM

## 2018-01-24 DIAGNOSIS — I5022 Chronic systolic (congestive) heart failure: Secondary | ICD-10-CM

## 2018-01-24 HISTORY — PX: RIGHT HEART CATH: CATH118263

## 2018-01-24 LAB — BASIC METABOLIC PANEL
Anion gap: 10 (ref 5–15)
BUN: 18 mg/dL (ref 6–20)
CALCIUM: 9.5 mg/dL (ref 8.9–10.3)
CO2: 29 mmol/L (ref 22–32)
Chloride: 98 mmol/L (ref 98–111)
Creatinine, Ser: 1 mg/dL (ref 0.61–1.24)
GFR calc non Af Amer: >60 mL/min (ref >60)
Glucose, Bld: 93 mg/dL (ref 70–99)
Potassium: 4.3 mmol/L (ref 3.5–5.1)
Sodium: 137 mmol/L (ref 135–145)

## 2018-01-24 LAB — POCT I-STAT 3, VENOUS BLOOD GAS (G3P V)
ACID-BASE EXCESS: 3 mmol/L — AB (ref 0.0–2.0)
ACID-BASE EXCESS: 4 mmol/L — AB (ref 0.0–2.0)
Acid-Base Excess: 5 mmol/L — ABNORMAL HIGH (ref 0.0–2.0)
Acid-Base Excess: 3 mmol/L — ABNORMAL HIGH (ref 0.0–2.0)
BICARBONATE: 28.5 mmol/L — AB (ref 20.0–28.0)
BICARBONATE: 28.7 mmol/L — AB (ref 20.0–28.0)
BICARBONATE: 30.3 mmol/L — AB (ref 20.0–28.0)
Bicarbonate: 28.7 mmol/L — ABNORMAL HIGH (ref 20.0–28.0)
O2 SAT: 61 %
O2 SAT: 64 %
O2 Saturation: 58 %
O2 Saturation: 58 %
PCO2 VEN: 44 mmHg (ref 44.0–60.0)
PH VEN: 7.424 (ref 7.250–7.430)
PO2 VEN: 30 mmHg — AB (ref 32.0–45.0)
TCO2: 30 mmol/L (ref 22–32)
TCO2: 30 mmol/L (ref 22–32)
TCO2: 32 mmol/L (ref 22–32)
TCO2: 30 mmol/L (ref 22–32)
pCO2, Ven: 44.4 mmHg (ref 44.0–60.0)
pCO2, Ven: 46.2 mmHg (ref 44.0–60.0)
pCO2, Ven: 44.9 mmHg (ref 44.0–60.0)
pH, Ven: 7.419 (ref 7.250–7.430)
pH, Ven: 7.413 (ref 7.250–7.430)
pH, Ven: 7.418 (ref 7.250–7.430)
pO2, Ven: 30 mmHg — CL (ref 32.0–45.0)
pO2, Ven: 31 mmHg — CL (ref 32.0–45.0)
pO2, Ven: 33 mmHg (ref 32.0–45.0)

## 2018-01-24 LAB — CBC WITH DIFFERENTIAL/PLATELET
Abs Immature Granulocytes: 0.04 10*3/uL (ref 0.00–0.07)
Basophils Absolute: 0.1 10*3/uL (ref 0.0–0.1)
Basophils Relative: 1 %
EOS ABS: 0.5 10*3/uL (ref 0.0–0.5)
EOS PCT: 4 %
HCT: 37.2 % — ABNORMAL LOW (ref 39.0–52.0)
Hemoglobin: 12.7 g/dL — ABNORMAL LOW (ref 13.0–17.0)
Immature Granulocytes: 0 %
Lymphocytes Relative: 20 %
Lymphs Abs: 2.2 10*3/uL (ref 0.7–4.0)
MCH: 31.2 pg (ref 26.0–34.0)
MCHC: 34.1 g/dL (ref 30.0–36.0)
MCV: 91.4 fL (ref 80.0–100.0)
MONO ABS: 0.9 10*3/uL (ref 0.1–1.0)
MONOS PCT: 9 %
Neutro Abs: 7 10*3/uL (ref 1.7–7.7)
Neutrophils Relative %: 66 %
Platelets: 289 10*3/uL (ref 150–400)
RBC: 4.07 MIL/uL — ABNORMAL LOW (ref 4.22–5.81)
RDW: 12.8 % (ref 11.5–15.5)
WBC: 10.6 10*3/uL — ABNORMAL HIGH (ref 4.0–10.5)
nRBC: 0 % (ref 0.0–0.2)

## 2018-01-24 LAB — COOXEMETRY PANEL
CARBOXYHEMOGLOBIN: 1.1 % (ref 0.5–1.5)
Methemoglobin: 1.6 % — ABNORMAL HIGH (ref 0.0–1.5)
O2 Saturation: 56.6 %
TOTAL HEMOGLOBIN: 13.1 g/dL (ref 12.0–16.0)

## 2018-01-24 SURGERY — RIGHT HEART CATH
Anesthesia: LOCAL

## 2018-01-24 MED ORDER — HEPARIN (PORCINE) IN NACL 1000-0.9 UT/500ML-% IV SOLN
INTRAVENOUS | Status: DC | PRN
  Administered 2018-01-24: 500 mL

## 2018-01-24 MED ORDER — SODIUM CHLORIDE 0.9% FLUSH
3.0000 mL | INTRAVENOUS | Status: DC | PRN
  Administered 2018-01-25 (×2): 3 mL via INTRAVENOUS
  Filled 2018-01-24 (×2): qty 3

## 2018-01-24 MED ORDER — SODIUM CHLORIDE 0.9 % IV SOLN: INTRAVENOUS | Status: DC

## 2018-01-24 MED ORDER — SODIUM CHLORIDE 0.9% FLUSH: 3.0000 mL | INTRAVENOUS | Status: DC | PRN

## 2018-01-24 MED ORDER — ASPIRIN 81 MG PO CHEW
81.0000 mg | CHEWABLE_TABLET | ORAL | Status: AC
  Administered 2018-01-24: 81 mg via ORAL
  Filled 2018-01-24: qty 1

## 2018-01-24 MED ORDER — SODIUM CHLORIDE 0.9 % IV SOLN: 250.0000 mL | INTRAVENOUS | Status: DC | PRN

## 2018-01-24 MED ORDER — SODIUM CHLORIDE 0.9% FLUSH: 3.0000 mL | Freq: Two times a day (BID) | INTRAVENOUS | Status: DC

## 2018-01-24 MED ORDER — ONDANSETRON HCL 4 MG/2ML IJ SOLN: 4.0000 mg | Freq: Four times a day (QID) | INTRAMUSCULAR | Status: DC | PRN

## 2018-01-24 MED ORDER — FENTANYL CITRATE (PF) 100 MCG/2ML IJ SOLN
INTRAMUSCULAR | Status: AC
  Filled 2018-01-24: qty 2

## 2018-01-24 MED ORDER — LIDOCAINE HCL (PF) 1 % IJ SOLN
INTRAMUSCULAR | Status: DC | PRN
  Administered 2018-01-24: 30 mL

## 2018-01-24 MED ORDER — MIDAZOLAM HCL 2 MG/2ML IJ SOLN
INTRAMUSCULAR | Status: DC | PRN
  Administered 2018-01-24: 1 mg via INTRAVENOUS

## 2018-01-24 MED ORDER — ENOXAPARIN SODIUM 40 MG/0.4ML ~~LOC~~ SOLN
40.0000 mg | SUBCUTANEOUS | Status: DC
  Filled 2018-01-24: qty 0.4

## 2018-01-24 MED ORDER — ACETAMINOPHEN 325 MG PO TABS: 650.0000 mg | ORAL_TABLET | ORAL | Status: DC | PRN

## 2018-01-24 MED ORDER — FENTANYL CITRATE (PF) 100 MCG/2ML IJ SOLN
INTRAMUSCULAR | Status: DC | PRN
  Administered 2018-01-24: 25 ug via INTRAVENOUS

## 2018-01-24 MED ORDER — HEPARIN (PORCINE) IN NACL 1000-0.9 UT/500ML-% IV SOLN
INTRAVENOUS | Status: AC
  Filled 2018-01-24: qty 500

## 2018-01-24 MED ORDER — MIDAZOLAM HCL 2 MG/2ML IJ SOLN
INTRAMUSCULAR | Status: AC
  Filled 2018-01-24: qty 2

## 2018-01-24 MED ORDER — SODIUM CHLORIDE 0.9% FLUSH
3.0000 mL | Freq: Two times a day (BID) | INTRAVENOUS | Status: DC
  Administered 2018-01-24 – 2018-01-25 (×2): 3 mL via INTRAVENOUS

## 2018-01-24 MED ORDER — LIDOCAINE HCL (PF) 1 % IJ SOLN
INTRAMUSCULAR | Status: AC
  Filled 2018-01-24: qty 30

## 2018-01-24 SURGICAL SUPPLY — 11 items
CATH SWAN GANZ 7F STRAIGHT (CATHETERS) ×2 IMPLANT
CATH-GARD ARROW CATH SHIELD (MISCELLANEOUS) ×2
PACK CARDIAC CATHETERIZATION (CUSTOM PROCEDURE TRAY) ×2 IMPLANT
PROTECTION STATION PRESSURIZED (MISCELLANEOUS) ×2
SHEATH PINNACLE 7F 10CM (SHEATH) ×2 IMPLANT
SHEATH PINNACLE 8F 10CM (SHEATH) IMPLANT
SHIELD CATHGARD ARROW (MISCELLANEOUS) ×1 IMPLANT
STATION PROTECTION PRESSURIZED (MISCELLANEOUS) ×1 IMPLANT
TRANSDUCER W/STOPCOCK (MISCELLANEOUS) ×2 IMPLANT
TUBING ART PRESS 72  MALE/FEM (TUBING) ×1
TUBING ART PRESS 72 MALE/FEM (TUBING) ×1 IMPLANT

## 2018-01-24 NOTE — Progress Notes (Signed)
Saw patient after he returned from cath lab following RHC this am.   He has no questions re: upcoming VAD implant. Says he is "ready" and his family will be here Monday for the surgery.  Hessie Diener RN, VAD Coordinator [phone]

## 2018-01-24 NOTE — Progress Notes (Signed)
RN reports pt has been walking independently. Will f/u LP. Ethelda Chick CES, ACSM 2:40 PM 01/24/2018

## 2018-01-24 NOTE — Interval H&P Note (Signed)
History and Physical Interval Note:  01/24/2018 7:35 AM  Carl Ferguson  has presented today for surgery, with the diagnosis of hf  The various methods of treatment have been discussed with the patient and family. After consideration of risks, benefits and other options for treatment, the patient has consented to  Procedure(s): RIGHT HEART CATH - SWAN (N/A) as a surgical intervention .  The patient's history has been reviewed, patient examined, no change in status, stable for surgery.  I have reviewed the patient's chart and labs.  Questions were answered to the patient's satisfaction.     Leon Goodnow [employer]

## 2018-01-24 NOTE — Progress Notes (Signed)
Patient is off floor in cath lab.

## 2018-01-24 NOTE — Progress Notes (Signed)
Patient ID: Carl Ferguson, male   DOB: 1965-11-06, 52 y.o.   MRN: [id number]     Advanced Heart Failure Rounding Note  PCP-Cardiologist: Garwin Brothers, MD   Subjective:    No complaints this morning.  Afebrile.  RHC done today on milrinone 0.25, see below. Ernestine Conrad was not left in place.   RHC Procedural Findings (on milrinone 0.25): Hemodynamics (mmHg) RA mean 2 RV 40/7 PA 36/17, mean 24 PCWP mean 17 Oxygen saturations: PA 63% AO 95% Cardiac Output (Fick) 4.21  Cardiac Index (Fick) 2.41 PVR 1.7 WU Cardiac Output (Thermo) 4.92 Cardiac Index (Fick) 2.82  PAPI 4.5  Objective:   Weight Range: 64.4 kg Body mass index is 22.24 kg/m.   Vital Signs:   Temp:  [97.3 F (36.3 C)-98.2 F (36.8 C)] 97.9 F (36.6 C) (10/11 0323) Pulse Rate:  [80-103] 93 (10/11 0808) Resp:  [14-28] 16 (10/11 0813) BP: (95-132)/(70-92) 103/72 (10/11 0808) SpO2:  [0 %-100 %] 0 % (10/11 0813) Weight:  [64.4 kg] 64.4 kg (10/11 0500) Last BM Date: 01/23/18  Weight change: Filed Weights   01/22/18 0423 01/23/18 0609 01/24/18 0500  Weight: 64.1 kg 63.6 kg 64.4 kg   Intake/Output:   Intake/Output Summary (Last 24 hours) at 01/24/2018 0821 Last data filed at 01/23/2018 2300 Gross per 24 hour  Intake 673.42 ml  Output 1451 ml  Net -777.58 ml    Physical Exam    General: NAD Neck: JVP 7-8 cm, no thyromegaly or thyroid nodule.  Lungs: Clear to auscultation bilaterally with normal respiratory effort. CV: Lateral PMI.  Heart regular S1/S2, +soft S3, 2/6 HSM apex.  No peripheral edema.    Abdomen: Soft, nontender, no hepatosplenomegaly, no distention.  Skin: Intact without lesions or rashes.  Neurologic: Alert and oriented x 3.  Psych: Normal affect. Extremities: No clubbing or cyanosis.  HEENT: Normal.    Telemetry   NSR with His pacing, personally reviewed.   Labs    CBC Recent Labs    01/23/18 0558 01/24/18 0457  WBC 9.4 10.6*  NEUTROABS 6.1 7.0  HGB 12.7* 12.7*  HCT 36.3*  37.2*  MCV 90.8 91.4  PLT 238 289   Basic Metabolic Panel Recent Labs    56/21/30 0558 01/24/18 0457  NA 135 137  K 3.9 4.3  CL 97* 98  CO2 29 29  GLUCOSE 107* 93  BUN 11 18  CREATININE 0.92 1.00  CALCIUM 9.6 9.5   Liver Function Tests No results for input(s): AST, ALT, ALKPHOS, BILITOT, PROT, ALBUMIN in the last 72 hours. No results for input(s): LIPASE, AMYLASE in the last 72 hours. Cardiac Enzymes No results for input(s): CKTOTAL, CKMB, CKMBINDEX, TROPONINI in the last 72 hours.  BNP: BNP (last 3 results) Recent Labs    06/13/17 2143 07/02/17 1531 12/30/17 0858  BNP 1,699.9* 532.9* 4,087.0*    ProBNP (last 3 results) No results for input(s): PROBNP in the last 8760 hours.   D-Dimer No results for input(s): DDIMER in the last 72 hours. Hemoglobin A1C No results for input(s): HGBA1C in the last 72 hours. Fasting Lipid Panel No results for input(s): CHOL, HDL, LDLCALC, TRIG, CHOLHDL, LDLDIRECT in the last 72 hours. Thyroid Function Tests No results for input(s): TSH, T4TOTAL, T3FREE, THYROIDAB in the last 72 hours.  Invalid input(s): FREET3  Other results:   Imaging    No results found.   Medications:     Scheduled Medications: . [MAR Hold] amiodarone  200 mg Oral BID  . [facility]  Hold] amoxicillin-clavulanate  1 tablet Oral Q12H  . [MAR Hold] aspirin EC  81 mg Oral Daily  . [MAR Hold] chlorhexidine  15 mL Mouth/Throat BID  . [MAR Hold] digoxin  0.125 mg Oral Daily  . [MAR Hold] feeding supplement (ENSURE ENLIVE)  237 mL Oral BID BM  . [MAR Hold] furosemide  80 mg Oral BID  . [MAR Hold] multivitamin with minerals  1 tablet Oral Daily  . [MAR Hold] nicotine  14 mg Transdermal Daily  . [MAR Hold] pantoprazole  40 mg Oral Daily  . [MAR Hold] sodium chloride flush  10-40 mL Intracatheter Q12H  . [MAR Hold] sodium chloride flush  3 mL Intravenous Q12H  . sodium chloride flush  3 mL Intravenous Q12H  . [MAR Hold] spironolactone  12.5 mg Oral Daily     Infusions: . [MAR Hold] sodium chloride    . sodium chloride    . sodium chloride Stopped (01/24/18 0511)  . lactated ringers Stopped (01/20/18 1020)  . milrinone 0.25 mcg/kg/min (01/23/18 2320)    PRN Medications: [MAR Hold] sodium chloride, sodium chloride, [MAR Hold] acetaminophen, [MAR Hold] albuterol, [MAR Hold] HYDROcodone-acetaminophen, [MAR Hold] ondansetron (ZOFRAN) IV, [MAR Hold] sodium chloride flush, [MAR Hold] sodium chloride flush, sodium chloride flush  Assessment/Plan   1. ID: Patient presented with fever/chills/headache. Tm 100.1.  He had blood cultures drawn 10/4 and 10/5. BP soft but lactate normal.  PICC line site and ICD site look benign.  Of note, he had fever recently when he was in the hospital, resolved after removal of PICC line but no definite cause found. TEE last admission without vegetation on valves or ICD wires noted.  UA negative, CXR without PNA.  Now afebrile. Cultures remain negative. C. Diff negative but PCT 9.17 10/5. ID has seen and have not found definite localizing source.  Underwent dental extractions and gross debridement with Dr Kristin Bruins. PICC line site and ICD site look benign. WBCs 10 today.  - Per ID recs will continue Augmentin for possible dental infection x 7 days.    2. Chronic Systolic CHF:  Nonischemic cardiomyopathy, diagnosed in 2011, initially followed at [place]/[facility].  LHC (3/19) with no significant CAD.  CPX (3/19) submaximal, but suggestive of mild to moderate HF limitation. HIV negative 3/19, history of drug/ETOH abuse in the past, no drugs now and had cut back ETOH considerably.  Echo 9/19 showed EF 20% with severe LV dilation, severe MR.  NYHA class IV symptoms. He has a wide LBBB.  RHC was done in 9/19 showing low output, CI 1.4.  He was admitted to start milrinone, unable to wean off milrinone and remains on 0.25 mcg/kg/min at home.  Medtronic ICD was placed, he has a His bundle lead but EP was unable to get an LV lead in place =>  His bundle pacing does not narrow his QRS and did not help [place] wean him off milrinone.  He has been doing well on milrinone 0.25 mcg/kg/min, NYHA class II.  RHC shows filling pressures and cardiac output to be fairly well optimized.  With good numbers and PICC in place, I did not leave the swan in today.  - Continue milrinone 0.25   - Continue lasix 80 mg po BID.   - Continue digoxin, level ok.   - Continue spironolactone 12.5 daily.  - Unable to place LV lead as above.  - Not thought to be Mitraclip candidate (see below).  - At this time, his best option for long-term survival will be  LVAD placement.  We have discussed this at length with patient and girlfriend. Current plan will be to continue milrinone at home until he has LVAD placed.  He has had dental extractions with Dr. Kristin Bruins.   - Plan for LVAD on Monday.  Will keep in hospital until then to monitor for infectious complications.  - He may be an eventual transplant candidate if he stays off cigarettes.  3. ETOH Abuse: No longer drinking. No change.   4. Tobacco abuse: He has quit smoking since admission in 9/19. Will encouraged continued cessation.  5. Mitral regurgitation: Severe functional MR.  With LV dilated > 7 cm, suspect Mitraclip would be unlikely to help him appreciably.  No change for now.  6. VT: Patient had episode of VT requiring ATP.   - Continue amiodarone 200 mg bid.  - Keep K> 4.0 Mg > 2.0.  - No further VT.  Marca Ancona 01/24/2018 8:21 AM

## 2018-01-24 NOTE — Progress Notes (Signed)
Site area: Right groin a 7 french venous sheath was removed  Site Prior to Removal:  Level 0  Pressure Applied For 15 MINUTES    Bedrest Beginning at 0845am  Manual:   Yes.    Patient Status During Pull:  stable  Post Pull Groin Site:  Level 0  Post Pull Instructions Given:  Yes.    Post Pull Pulses Present:  Yes.    Dressing Applied:  Yes.    Comments:  VS remain stable

## 2018-01-24 NOTE — Progress Notes (Signed)
Day of Surgery Procedure(s) (LRB): RIGHT HEART CATH - SWAN (N/A) Subjective: Results of RHC noted patient has been optimized for implantation of Heartmate 3 VAD for destination therapy indication with milrinone and diuresis Surgery booked for Monday Procedure , benefits and potential risks have been reviewed with patient who agrees to proceed with surgery    Objective: Vital signs in last 24 hours: Temp:  [97.3 F (36.3 C)-98.2 F (36.8 C)] 98 F (36.7 C) (10/11 1056) Pulse Rate:  [80-103] 85 (10/11 0845) Cardiac Rhythm: Normal sinus rhythm (10/11 0826) Resp:  [13-28] 13 (10/11 1056) BP: (95-132)/(70-92) 98/78 (10/11 1056) SpO2:  [0 %-100 %] 97 % (10/11 1056) Weight:  [64.4 kg] 64.4 kg (10/11 0500)  Hemodynamic parameters for last 24 hours: CVP:  [9 mmHg-13 mmHg] 13 mmHg  Intake/Output from previous day: 10/10 0701 - 10/11 0700 In: 711.3 [P.O.:600; I.V.:111.3] Out: 1452 [Urine:1450; Stool:2] Intake/Output this shift: Total I/O In: 240 [P.O.:240] Out: -        Exam    General- alert and comfortable.Mouth incisions clean, not bleeding    Neck- no JVD, no cervical adenopathy palpable, no carotid bruit   Lungs- clear without rales, wheezes   Cor- regular rate and rhythm, no murmur , gallop   Abdomen- soft, non-tender   Extremities - warm, non-tender, minimal edema   Neuro- oriented, appropriate, no focal weakness  Lab Results: Recent Labs    01/23/18 0558 01/24/18 0457  WBC 9.4 10.6*  HGB 12.7* 12.7*  HCT 36.3* 37.2*  PLT 238 289   BMET:  Recent Labs    01/23/18 0558 01/24/18 0457  NA 135 137  K 3.9 4.3  CL 97* 98  CO2 29 29  GLUCOSE 107* 93  BUN 11 18  CREATININE 0.92 1.00  CALCIUM 9.6 9.5    PT/INR: No results for input(s): LABPROT, INR in the last 72 hours. ABG    Component Value Date/Time   HCO3 22.6 12/30/2017 0812   HCO3 22.6 12/30/2017 0812   TCO2 24 12/30/2017 0812   TCO2 24 12/30/2017 0812   ACIDBASEDEF 2.0 12/30/2017 0812   ACIDBASEDEF 2.0 12/30/2017 0812   O2SAT 56.6 01/24/2018 0457   CBG (last 3)  No results for input(s): GLUCAP in the last 72 hours.  Assessment/Plan: S/P Procedure(s) (LRB): RIGHT HEART CATH - SWAN (N/A) HM3 Implant 10/14   LOS: 5 days    Carl Ferguson 01/24/2018

## 2018-01-25 DIAGNOSIS — I5023 Acute on chronic systolic (congestive) heart failure: Secondary | ICD-10-CM

## 2018-01-25 LAB — CBC WITH DIFFERENTIAL/PLATELET
ABS IMMATURE GRANULOCYTES: 0.03 10*3/uL (ref 0.00–0.07)
Basophils Absolute: 0.1 10*3/uL (ref 0.0–0.1)
Basophils Relative: 1 %
Eosinophils Absolute: 0.4 10*3/uL (ref 0.0–0.5)
Eosinophils Relative: 5 %
HCT: 37.2 % — ABNORMAL LOW (ref 39.0–52.0)
Hemoglobin: 13.2 g/dL (ref 13.0–17.0)
IMMATURE GRANULOCYTES: 0 %
LYMPHS ABS: 1.7 10*3/uL (ref 0.7–4.0)
LYMPHS PCT: 19 %
MCH: 32 pg (ref 26.0–34.0)
MCHC: 35.5 g/dL (ref 30.0–36.0)
MCV: 90.1 fL (ref 80.0–100.0)
MONO ABS: 0.9 10*3/uL (ref 0.1–1.0)
MONOS PCT: 10 %
NEUTROS ABS: 5.9 10*3/uL (ref 1.7–7.7)
NEUTROS PCT: 65 %
PLATELETS: 285 10*3/uL (ref 150–400)
RBC: 4.13 MIL/uL — ABNORMAL LOW (ref 4.22–5.81)
RDW: 12.9 % (ref 11.5–15.5)
WBC: 9 10*3/uL (ref 4.0–10.5)
nRBC: 0 % (ref 0.0–0.2)

## 2018-01-25 LAB — BASIC METABOLIC PANEL
ANION GAP: 9 (ref 5–15)
BUN: 13 mg/dL (ref 6–20)
CO2: 30 mmol/L (ref 22–32)
Calcium: 9.7 mg/dL (ref 8.9–10.3)
Chloride: 96 mmol/L — ABNORMAL LOW (ref 98–111)
Creatinine, Ser: 1.05 mg/dL (ref 0.61–1.24)
GFR calc Af Amer: >60 mL/min (ref >60)
GLUCOSE: 105 mg/dL — AB (ref 70–99)
POTASSIUM: 3.8 mmol/L (ref 3.5–5.1)
Sodium: 135 mmol/L (ref 135–145)

## 2018-01-25 LAB — COOXEMETRY PANEL
Carboxyhemoglobin: 1.6 % — ABNORMAL HIGH (ref 0.5–1.5)
METHEMOGLOBIN: 0.9 % (ref 0.0–1.5)
O2 Saturation: 65.9 %
TOTAL HEMOGLOBIN: 12.6 g/dL (ref 12.0–16.0)

## 2018-01-25 MED ORDER — POTASSIUM CHLORIDE CRYS ER 20 MEQ PO TBCR
40.0000 meq | EXTENDED_RELEASE_TABLET | Freq: Once | ORAL | Status: AC
  Administered 2018-01-25: 40 meq via ORAL
  Filled 2018-01-25: qty 2

## 2018-01-25 NOTE — Progress Notes (Signed)
CARDIAC REHAB PHASE I   PRE:  Rate/Rhythm: 92 Paced  BP:  Supine:   Sitting: 105/78  Standing:    SaO2: 98% RA  MODE:  Ambulation: 620 ft   POST:  Rate/Rhythm: 95 Paced  BP:  Supine:   Sitting: 106/87  Standing:    SaO2: 97% RA  0941-1000 Patient tolerated ambulation well, independent with walking. Gait steady, no symptoms with exertion, VSS.  Artist Pais, MS, ACSM CEP

## 2018-01-25 NOTE — Progress Notes (Signed)
Patient ID: Carl Ferguson, male   DOB: [birth year], 52 y.o.   MRN: [id number]     Advanced Heart Failure Rounding Note  PCP-Cardiologist: Garwin Brothers, MD   Subjective:     Feels good on milrinone. Walking the hall. No CP or SOB. BP stable. Afebrile.    RHC 10/11 Procedural Findings (on milrinone 0.25): Hemodynamics (mmHg) RA mean 2 RV 40/7 PA 36/17, mean 24 PCWP mean 17 Oxygen saturations: PA 63% AO 95% Cardiac Output (Fick) 4.21  Cardiac Index (Fick) 2.41 PVR 1.7 WU Cardiac Output (Thermo) 4.92 Cardiac Index (Fick) 2.82  PAPI 4.5  Objective:   Weight Range: 63 kg Body mass index is 21.75 kg/m.   Vital Signs:   Temp:  [97.5 F (36.4 C)-98.1 F (36.7 C)] 98.1 F (36.7 C) (10/12 0800) Pulse Rate:  [102] 102 (10/11 1946) Resp:  [11-27] 22 (10/12 0800) BP: (90-117)/(76-97) 107/76 (10/12 0800) SpO2:  [95 %-99 %] 95 % (10/12 0800) Weight:  [63 kg] 63 kg (10/12 0530) Last BM Date: 01/23/18  Weight change: Filed Weights   01/23/18 0609 01/24/18 0500 01/25/18 0530  Weight: 63.6 kg 64.4 kg 63 kg   Intake/Output:   Intake/Output Summary (Last 24 hours) at 01/25/2018 0934 Last data filed at 01/25/2018 0615 Gross per 24 hour  Intake 1707 ml  Output 500 ml  Net 1207 ml    Physical Exam    General:  Well appearing. No resp difficulty HEENT: normal Neck: supple. no JVD. Carotids 2+ bilat; no bruits. No lymphadenopathy or thryomegaly appreciated. Cor: PMI laterally displaced. Regular  Mildly tachy +s3 Lungs: clear Abdomen: soft, nontender, nondistended. No hepatosplenomegaly. No bruits or masses. Good bowel sounds. Extremities: no cyanosis, clubbing, rash, edema Neuro: alert & orientedx3, cranial nerves grossly intact. moves all 4 extremities w/o difficulty. Affect pleasant   Telemetry   Sinus -sinus tach 90-105 with His pacing, personally reviewed.   Labs    CBC Recent Labs    01/24/18 0457 01/25/18 0500  WBC 10.6* 9.0  NEUTROABS 7.0 5.9  HGB  12.7* 13.2  HCT 37.2* 37.2*  MCV 91.4 90.1  PLT 289 285   Basic Metabolic Panel Recent Labs    84/16/60 0457 01/25/18 0500  NA 137 135  K 4.3 3.8  CL 98 96*  CO2 29 30  GLUCOSE 93 105*  BUN 18 13  CREATININE 1.00 1.05  CALCIUM 9.5 9.7   Liver Function Tests No results for input(s): AST, ALT, ALKPHOS, BILITOT, PROT, ALBUMIN in the last 72 hours. No results for input(s): LIPASE, AMYLASE in the last 72 hours. Cardiac Enzymes No results for input(s): CKTOTAL, CKMB, CKMBINDEX, TROPONINI in the last 72 hours.  BNP: BNP (last 3 results) Recent Labs    06/13/17 2143 07/02/17 1531 12/30/17 0858  BNP 1,699.9* 532.9* 4,087.0*    ProBNP (last 3 results) No results for input(s): PROBNP in the last 8760 hours.   D-Dimer No results for input(s): DDIMER in the last 72 hours. Hemoglobin A1C No results for input(s): HGBA1C in the last 72 hours. Fasting Lipid Panel No results for input(s): CHOL, HDL, LDLCALC, TRIG, CHOLHDL, LDLDIRECT in the last 72 hours. Thyroid Function Tests No results for input(s): TSH, T4TOTAL, T3FREE, THYROIDAB in the last 72 hours.  Invalid input(s): FREET3  Other results:   Imaging    No results found.   Medications:     Scheduled Medications: . amiodarone  200 mg Oral BID  . amoxicillin-clavulanate  1 tablet Oral Q12H  . aspirin  EC  81 mg Oral Daily  . chlorhexidine  15 mL Mouth/Throat BID  . digoxin  0.125 mg Oral Daily  . enoxaparin (LOVENOX) injection  40 mg Subcutaneous Q24H  . feeding supplement (ENSURE ENLIVE)  237 mL Oral BID BM  . furosemide  80 mg Oral BID  . multivitamin with minerals  1 tablet Oral Daily  . nicotine  14 mg Transdermal Daily  . pantoprazole  40 mg Oral Daily  . sodium chloride flush  10-40 mL Intracatheter Q12H  . sodium chloride flush  3 mL Intravenous Q12H  . sodium chloride flush  3 mL Intravenous Q12H  . spironolactone  12.5 mg Oral Daily    Infusions: . sodium chloride    . sodium chloride    .  lactated ringers Stopped (01/20/18 1020)  . milrinone 0.25 mcg/kg/min (01/25/18 0615)    PRN Medications: sodium chloride, sodium chloride, acetaminophen, albuterol, HYDROcodone-acetaminophen, ondansetron (ZOFRAN) IV, sodium chloride flush, sodium chloride flush, sodium chloride flush  Assessment/Plan   1. ID: Patient presented with fever/chills/headache. Tm 100.1.  He had blood cultures drawn 10/4 and 10/5. BP soft but lactate normal.  PICC line site and ICD site look benign.  Of note, he had fever recently when he was in the hospital, resolved after removal of PICC line but no definite cause found. TEE last admission without vegetation on valves or ICD wires noted.  UA negative, CXR without PNA.  Now afebrile. Cultures remain negative. C. Diff negative but PCT 9.17 10/5. ID has seen and have not found definite localizing source.  Underwent dental extractions and gross debridement with Dr Kristin Bruins. PICC line site and ICD site look benign. WBCs 10-> 9.0  today.  - Per ID recs will continue Augmentin for possible dental infection x 7 days.    2. Chronic Systolic CHF:  Nonischemic cardiomyopathy, diagnosed in 2011, initially followed at [place]/[facility].  LHC (3/19) with no significant CAD.  CPX (3/19) submaximal, but suggestive of mild to moderate HF limitation. HIV negative 3/19, history of drug/ETOH abuse in the past, no drugs now and had cut back ETOH considerably.  Echo 9/19 showed EF 20% with severe LV dilation, severe MR.  NYHA class IV symptoms. He has a wide LBBB.  RHC was done in 9/19 showing low output, CI 1.4.  He was admitted to start milrinone, unable to wean off milrinone and remains on 0.25 mcg/kg/min at home.  Medtronic ICD was placed, he has a His bundle lead but EP was unable to get an LV lead in place => His bundle pacing does not narrow his QRS and did not help [place] wean him off milrinone.  He has been doing well on milrinone 0.25 mcg/kg/min, NYHA class II.  RHC 10/11 shows filling pressures  and cardiac output to be fairly well optimized.   - Co-ox 66% today. Continue milrinone 0.25   - Continue lasix 80 mg po BID.   - Continue digoxin, level ok.   - Continue spironolactone 12.5 daily.  - Unable to place LV lead as above.  - Not thought to be Mitraclip candidate (see below).  - Plan for LVAD on Monday. We discussed procedure and post-op course,  Will keep in hospital until then to monitor for infectious complications.  - He may be an eventual transplant candidate if he stays off cigarettes.  3. ETOH Abuse: No longer drinking. No change.   4. Tobacco abuse: He has quit smoking since admission in 9/19. Will encouraged continued cessation.  5.  Mitral regurgitation: Severe functional MR.  With LV dilated > 7 cm, suspect Mitraclip would be unlikely to help him appreciably.  No change for now.  6. VT: Patient had episode of VT requiring ATP.   - Continue amiodarone 200 mg bid.  - Keep K> 4.0 Mg > 2.0.  K 3.8 today. Will supp  - No further VT.  Reuel Boom Warrick Llera 01/25/2018 9:34 AM

## 2018-01-26 DIAGNOSIS — R570 Cardiogenic shock: Secondary | ICD-10-CM

## 2018-01-26 LAB — COOXEMETRY PANEL
Carboxyhemoglobin: 1.6 % — ABNORMAL HIGH (ref 0.5–1.5)
Methemoglobin: 0.7 % (ref 0.0–1.5)
O2 Saturation: 62.3 %
Total hemoglobin: 13.5 g/dL (ref 12.0–16.0)

## 2018-01-26 LAB — CBC WITH DIFFERENTIAL/PLATELET
Abs Immature Granulocytes: 0.03 10*3/uL (ref 0.00–0.07)
BASOS ABS: 0.1 10*3/uL (ref 0.0–0.1)
Basophils Relative: 1 %
Eosinophils Absolute: 0.4 10*3/uL (ref 0.0–0.5)
Eosinophils Relative: 5 %
HCT: 38 % — ABNORMAL LOW (ref 39.0–52.0)
HEMOGLOBIN: 13.1 g/dL (ref 13.0–17.0)
Immature Granulocytes: 0 %
LYMPHS PCT: 18 %
Lymphs Abs: 1.6 10*3/uL (ref 0.7–4.0)
MCH: 31.3 pg (ref 26.0–34.0)
MCHC: 34.5 g/dL (ref 30.0–36.0)
MCV: 90.7 fL (ref 80.0–100.0)
Monocytes Absolute: 1.1 10*3/uL — ABNORMAL HIGH (ref 0.1–1.0)
Monocytes Relative: 13 %
NEUTROS PCT: 63 %
NRBC: 0 % (ref 0.0–0.2)
Neutro Abs: 5.5 10*3/uL (ref 1.7–7.7)
Platelets: 289 10*3/uL (ref 150–400)
RBC: 4.19 MIL/uL — AB (ref 4.22–5.81)
RDW: 12.9 % (ref 11.5–15.5)
WBC: 8.7 10*3/uL (ref 4.0–10.5)

## 2018-01-26 LAB — BASIC METABOLIC PANEL
Anion gap: 12 (ref 5–15)
BUN: 15 mg/dL (ref 6–20)
CHLORIDE: 95 mmol/L — AB (ref 98–111)
CO2: 28 mmol/L (ref 22–32)
Calcium: 10 mg/dL (ref 8.9–10.3)
Creatinine, Ser: 0.99 mg/dL (ref 0.61–1.24)
GFR calc non Af Amer: >60 mL/min (ref >60)
Glucose, Bld: 89 mg/dL (ref 70–99)
Potassium: 4 mmol/L (ref 3.5–5.1)
SODIUM: 135 mmol/L (ref 135–145)

## 2018-01-26 LAB — PREPARE RBC (CROSSMATCH)

## 2018-01-26 MED ORDER — CHLORHEXIDINE GLUCONATE 4 % EX LIQD: 60.0000 mL | Freq: Once | CUTANEOUS | Status: AC

## 2018-01-26 MED ORDER — MILRINONE LACTATE IN DEXTROSE 20-5 MG/100ML-% IV SOLN
0.3000 ug/kg/min | INTRAVENOUS | Status: AC
  Administered 2018-01-27: .25 mg/kg/min via INTRAVENOUS
  Filled 2018-01-26: qty 100

## 2018-01-26 MED ORDER — POTASSIUM CHLORIDE 2 MEQ/ML IV SOLN
80.0000 meq | INTRAVENOUS | Status: DC
  Filled 2018-01-26 (×2): qty 40

## 2018-01-26 MED ORDER — SODIUM CHLORIDE 0.9 % IV SOLN
INTRAVENOUS | Status: DC
  Filled 2018-01-26: qty 30

## 2018-01-26 MED ORDER — EPINEPHRINE PF 1 MG/ML IJ SOLN
0.0000 ug/min | INTRAVENOUS | Status: AC
  Administered 2018-01-27: 2 ug/min via INTRAVENOUS
  Filled 2018-01-26: qty 4

## 2018-01-26 MED ORDER — TEMAZEPAM 15 MG PO CAPS: 15.0000 mg | ORAL_CAPSULE | Freq: Once | ORAL | Status: DC | PRN

## 2018-01-26 MED ORDER — CHLORHEXIDINE GLUCONATE 0.12 % MT SOLN
15.0000 mL | Freq: Once | OROMUCOSAL | Status: AC
  Administered 2018-01-27: 15 mL via OROMUCOSAL
  Filled 2018-01-26: qty 15

## 2018-01-26 MED ORDER — ALPRAZOLAM 0.25 MG PO TABS
0.2500 mg | ORAL_TABLET | ORAL | Status: DC | PRN
  Administered 2018-01-26: 0.5 mg via ORAL
  Filled 2018-01-26: qty 2

## 2018-01-26 MED ORDER — BISACODYL 5 MG PO TBEC
5.0000 mg | DELAYED_RELEASE_TABLET | Freq: Once | ORAL | Status: AC
  Administered 2018-01-26: 5 mg via ORAL
  Filled 2018-01-26: qty 1

## 2018-01-26 MED ORDER — MUPIROCIN 2 % EX OINT
1.0000 "application " | TOPICAL_OINTMENT | Freq: Two times a day (BID) | CUTANEOUS | Status: DC
  Administered 2018-01-26: 1 via NASAL
  Filled 2018-01-26: qty 22

## 2018-01-26 MED ORDER — DOPAMINE-DEXTROSE 3.2-5 MG/ML-% IV SOLN
0.0000 ug/kg/min | INTRAVENOUS | Status: DC
  Filled 2018-01-26: qty 250

## 2018-01-26 MED ORDER — SODIUM CHLORIDE 0.9 % IV SOLN
750.0000 mg | INTRAVENOUS | Status: DC
  Filled 2018-01-26: qty 750

## 2018-01-26 MED ORDER — NITROGLYCERIN IN D5W 200-5 MCG/ML-% IV SOLN
0.0000 ug/min | INTRAVENOUS | Status: AC
  Administered 2018-01-27: 5 ug/min via INTRAVENOUS
  Filled 2018-01-26: qty 250

## 2018-01-26 MED ORDER — CHLORHEXIDINE GLUCONATE 4 % EX LIQD
60.0000 mL | Freq: Once | CUTANEOUS | Status: AC
  Administered 2018-01-26: 4 via TOPICAL
  Filled 2018-01-26: qty 60

## 2018-01-26 MED ORDER — SODIUM CHLORIDE 0.9 % IV SOLN
1.5000 g | INTRAVENOUS | Status: AC
  Administered 2018-01-27: 1.5 g via INTRAVENOUS
  Filled 2018-01-26: qty 1.5

## 2018-01-26 MED ORDER — VASOPRESSIN 20 UNIT/ML IV SOLN
0.0400 [IU]/min | INTRAVENOUS | Status: DC
  Filled 2018-01-26: qty 2

## 2018-01-26 MED ORDER — DEXMEDETOMIDINE HCL IN NACL 400 MCG/100ML IV SOLN
0.1000 ug/kg/h | INTRAVENOUS | Status: AC
  Administered 2018-01-27: 0.7 ug/kg/h via INTRAVENOUS
  Filled 2018-01-26: qty 100

## 2018-01-26 MED ORDER — DOBUTAMINE IN D5W 4-5 MG/ML-% IV SOLN
2.0000 ug/kg/min | INTRAVENOUS | Status: DC
  Filled 2018-01-26: qty 250

## 2018-01-26 MED ORDER — CHLORHEXIDINE GLUCONATE 4 % EX LIQD
60.0000 mL | Freq: Once | CUTANEOUS | Status: AC
  Administered 2018-01-27: 4 via TOPICAL

## 2018-01-26 MED ORDER — SODIUM CHLORIDE 0.9 % IV SOLN
600.0000 mg | INTRAVENOUS | Status: AC
  Administered 2018-01-27: 600 mg via INTRAVENOUS
  Filled 2018-01-26: qty 600

## 2018-01-26 MED ORDER — VANCOMYCIN HCL 10 G IV SOLR
1250.0000 mg | INTRAVENOUS | Status: AC
  Administered 2018-01-27: 1250 mg via INTRAVENOUS
  Filled 2018-01-26: qty 1250

## 2018-01-26 MED ORDER — TRANEXAMIC ACID 1000 MG/10ML IV SOLN
1.5000 mg/kg/h | INTRAVENOUS | Status: DC
  Filled 2018-01-26: qty 25

## 2018-01-26 MED ORDER — DIAZEPAM 2 MG PO TABS
2.0000 mg | ORAL_TABLET | Freq: Once | ORAL | Status: AC
  Administered 2018-01-27: 2 mg via ORAL
  Filled 2018-01-26: qty 1

## 2018-01-26 MED ORDER — PHENYLEPHRINE HCL-NACL 20-0.9 MG/250ML-% IV SOLN
0.0000 ug/min | INTRAVENOUS | Status: DC
  Filled 2018-01-26: qty 250

## 2018-01-26 MED ORDER — TRANEXAMIC ACID (OHS) BOLUS VIA INFUSION
15.0000 mg/kg | INTRAVENOUS | Status: AC
  Administered 2018-01-27: 966 mg via INTRAVENOUS
  Filled 2018-01-26: qty 966

## 2018-01-26 MED ORDER — FLUCONAZOLE IN SODIUM CHLORIDE 400-0.9 MG/200ML-% IV SOLN
400.0000 mg | INTRAVENOUS | Status: AC
  Administered 2018-01-27: 400 mg via INTRAVENOUS
  Filled 2018-01-26: qty 200

## 2018-01-26 MED ORDER — NOREPINEPHRINE 4 MG/250ML-% IV SOLN
0.0000 ug/min | INTRAVENOUS | Status: AC
  Administered 2018-01-27: 2 ug/min via INTRAVENOUS
  Filled 2018-01-26: qty 250

## 2018-01-26 MED ORDER — TRANEXAMIC ACID (OHS) PUMP PRIME SOLUTION
2.0000 mg/kg | INTRAVENOUS | Status: DC
  Filled 2018-01-26: qty 1.29

## 2018-01-26 MED ORDER — VANCOMYCIN HCL 1 G IV SOLR
1000.0000 mg | INTRAVENOUS | Status: AC
  Administered 2018-01-27: 1000 mg
  Filled 2018-01-26: qty 1000

## 2018-01-26 MED ORDER — INSULIN REGULAR(HUMAN) IN NACL 100-0.9 UT/100ML-% IV SOLN
INTRAVENOUS | Status: DC
  Filled 2018-01-26: qty 100

## 2018-01-26 MED ORDER — MAGNESIUM SULFATE 50 % IJ SOLN
40.0000 meq | INTRAMUSCULAR | Status: DC
  Filled 2018-01-26: qty 9.85

## 2018-01-26 NOTE — Progress Notes (Signed)
2 Days Post-Op Procedure(s) (LRB): RIGHT HEART CATH - SWAN (N/A) Subjective: Nonischemic cardiomyopathy, advanced heart failure with cardiogenic shock on admission requiring inotropic support. Patient has been prepared for implantation of HeartMate 3 left ventricular assist device Indications for surgery--destination therapy.  Patient understands he is not a candidate for heart transplantation because he is not on a heart transplant waiting list due to contraindications from smoking, prior alcohol abuse. I discussed the procedure in detail the patient including the benefits of improved survival and improve quality of life. I discussed the potential risks including risk of stroke, bleeding, right heart failure requiring mechanical support, postoperative pulmonary problems including pleural effusion and pneumonia, postoperative organ failure and death.  He agrees to proceed with surgery under what I feel is an informed consent.  Objective: Vital signs in last 24 hours: Temp:  [97.7 F (36.5 C)-98.3 F (36.8 C)] 98.3 F (36.8 C) (10/13 0808) Pulse Rate:  [89-100] 89 (10/13 0445) Cardiac Rhythm: Ventricular paced;Bundle branch block;Other (Comment) (10/13 0700) Resp:  [16-23] 23 (10/13 0808) BP: (100-111)/(75-83) 100/80 (10/13 0808) SpO2:  [97 %-98 %] 97 % (10/13 0808) Weight:  [63.4 kg] 63.4 kg (10/13 0500)  Hemodynamic parameters for last 24 hours: CVP:  [5 mmHg-7 mmHg] 5 mmHg  Intake/Output from previous day: 10/12 0701 - 10/13 0700 In: 720 [P.O.:720] Out: 1200 [Urine:1200] Intake/Output this shift: No intake/output data recorded.  Alert and comfortable No JVD Lungs clear Positive S3 gallop, no murmur No edema Abdomen soft  Lab Results: Recent Labs    01/25/18 0500 01/26/18 0559  WBC 9.0 8.7  HGB 13.2 13.1  HCT 37.2* 38.0*  PLT 285 289   BMET:  Recent Labs    01/25/18 0500 01/26/18 0559  NA 135 135  K 3.8 4.0  CL 96* 95*  CO2 30 28  GLUCOSE 105* 89  BUN 13 15   CREATININE 1.05 0.99  CALCIUM 9.7 10.0    PT/INR: No results for input(s): LABPROT, INR in the last 72 hours. ABG    Component Value Date/Time   HCO3 28.7 (H) 01/24/2018 0806   HCO3 28.5 (H) 01/24/2018 0806   TCO2 30 01/24/2018 0806   TCO2 30 01/24/2018 0806   ACIDBASEDEF 2.0 12/30/2017 0812   ACIDBASEDEF 2.0 12/30/2017 0812   O2SAT 62.3 01/26/2018 0600   CBG (last 3)  No results for input(s): GLUCAP in the last 72 hours.  Assessment/Plan: S/P Procedure(s) (LRB): RIGHT HEART CATH - SWAN (N/A) HeartMate 3 implantation in a.m.   LOS: 7 days    Kathlee Nations Trigt III 01/26/2018

## 2018-01-26 NOTE — Anesthesia Preprocedure Evaluation (Addendum)
Anesthesia Evaluation  Patient identified by MRN, date of birth, ID band Patient awake    Reviewed: Allergy & Precautions, NPO status , Patient's Chart, lab work & pertinent test results  Airway Mallampati: II  TM Distance: >3 FB Neck ROM: Full    Dental  (+) Dental Advisory Given   Pulmonary shortness of breath, COPD, former smoker,    breath sounds clear to auscultation       Cardiovascular hypertension, +CHF  + dysrhythmias + Cardiac Defibrillator + Valvular Problems/Murmurs MR  Rhythm:Regular Rate:Normal  RHC 10/11 Procedural Findings (on milrinone 0.25): Hemodynamics (mmHg) RA mean 2 RV 40/7 PA 36/17, mean 24 PCWP mean 17 Oxygen saturations: PA 63% AO 95% Cardiac Output (Fick) 4.21  Cardiac Index (Fick) 2.41 PVR 1.7 WU Cardiac Output (Thermo) 4.92 Cardiac Index (Fick) 2.82  PAPI 4.5   Neuro/Psych PSYCHIATRIC DISORDERS negative neurological ROS     GI/Hepatic Neg liver ROS, GERD  ,  Endo/Other  negative endocrine ROS  Renal/GU negative Renal ROS     Musculoskeletal   Abdominal   Peds  Hematology negative hematology ROS (+)   Anesthesia Other Findings   Reproductive/Obstetrics                            Lab Results  Component Value Date   WBC 9.0 01/27/2018   HGB 13.1 01/27/2018   HCT 36.6 (L) 01/27/2018   MCV 90.1 01/27/2018   PLT 261 01/27/2018   Lab Results  Component Value Date   CREATININE 1.10 01/27/2018   BUN 18 01/27/2018   NA 136 01/27/2018   K 4.0 01/27/2018   CL 96 (L) 01/27/2018   CO2 32 01/27/2018    Anesthesia Physical Anesthesia Plan  ASA: IV  Anesthesia Plan: General   Post-op Pain Management:    Induction: Intravenous  PONV Risk Score and Plan: 2 and Ondansetron, Dexamethasone and Treatment may vary due to age or medical condition  Airway Management Planned: Oral ETT  Additional Equipment: Arterial line, CVP, PA Cath, TEE and  Ultrasound Guidance Line Placement  Intra-op Plan:   Post-operative Plan: Post-operative intubation/ventilation  Informed Consent: I have reviewed the patients History and Physical, chart, labs and discussed the procedure including the risks, benefits and alternatives for the proposed anesthesia with the patient or authorized representative who has indicated his/her understanding and acceptance.   Dental advisory given  Plan Discussed with: CRNA  Anesthesia Plan Comments:        Anesthesia Quick Evaluation

## 2018-01-26 NOTE — Progress Notes (Signed)
Patient ID: Carl Ferguson, male   DOB: 07-Sep-1965, 52 y.o.   MRN: [id number]     Advanced Heart Failure Rounding Note  PCP-Cardiologist: Garwin Brothers, MD   Subjective:     Feels good on milrinone. No fevers or chills. Ambulating with CP or SOB. Remains on milrinone 0.25. Has been seen by Dr. Donata Clay this am.   RHC 10/11 Procedural Findings (on milrinone 0.25): Hemodynamics (mmHg) RA mean 2 RV 40/7 PA 36/17, mean 24 PCWP mean 17 Oxygen saturations: PA 63% AO 95% Cardiac Output (Fick) 4.21  Cardiac Index (Fick) 2.41 PVR 1.7 WU Cardiac Output (Thermo) 4.92 Cardiac Index (Fick) 2.82  PAPI 4.5  Objective:   Weight Range: 63.4 kg Body mass index is 21.89 kg/m.   Vital Signs:   Temp:  [97.7 F (36.5 C)-98.3 F (36.8 C)] 98.3 F (36.8 C) (10/13 0808) Pulse Rate:  [89-100] 89 (10/13 0445) Resp:  [16-23] 23 (10/13 0808) BP: (100-111)/(75-83) 100/80 (10/13 0808) SpO2:  [97 %-98 %] 97 % (10/13 0808) Weight:  [63.4 kg] 63.4 kg (10/13 0500) Last BM Date: 01/25/18  Weight change: Filed Weights   01/24/18 0500 01/25/18 0530 01/26/18 0500  Weight: 64.4 kg 63 kg 63.4 kg   Intake/Output:   Intake/Output Summary (Last 24 hours) at 01/26/2018 1019 Last data filed at 01/25/2018 2230 Gross per 24 hour  Intake 480 ml  Output 1200 ml  Net -720 ml    Physical Exam    General:  Sitting in bed Well appearing. No resp difficulty HEENT: normal Neck: supple. no JVD. Carotids 2+ bilat; no bruits. No lymphadenopathy or thryomegaly appreciated. Cor: PMI laterally displaced. Regular rate & rhythm. Soft s3 Lungs: clear Abdomen: soft, nontender, nondistended. No hepatosplenomegaly. No bruits or masses. Good bowel sounds. Extremities: no cyanosis, clubbing, rash, edema PICC line site ok  Neuro: alert & orientedx3, cranial nerves grossly intact. moves all 4 extremities w/o difficulty. Affect pleasant  Telemetry   Sinus -sinus tach 90-100 with His pacing, personally reviewed.     Labs    CBC Recent Labs    01/25/18 0500 01/26/18 0559  WBC 9.0 8.7  NEUTROABS 5.9 5.5  HGB 13.2 13.1  HCT 37.2* 38.0*  MCV 90.1 90.7  PLT 285 289   Basic Metabolic Panel Recent Labs    09/15/54 0500 01/26/18 0559  NA 135 135  K 3.8 4.0  CL 96* 95*  CO2 30 28  GLUCOSE 105* 89  BUN 13 15  CREATININE 1.05 0.99  CALCIUM 9.7 10.0   Liver Function Tests No results for input(s): AST, ALT, ALKPHOS, BILITOT, PROT, ALBUMIN in the last 72 hours. No results for input(s): LIPASE, AMYLASE in the last 72 hours. Cardiac Enzymes No results for input(s): CKTOTAL, CKMB, CKMBINDEX, TROPONINI in the last 72 hours.  BNP: BNP (last 3 results) Recent Labs    06/13/17 2143 07/02/17 1531 12/30/17 0858  BNP 1,699.9* 532.9* 4,087.0*    ProBNP (last 3 results) No results for input(s): PROBNP in the last 8760 hours.   D-Dimer No results for input(s): DDIMER in the last 72 hours. Hemoglobin A1C No results for input(s): HGBA1C in the last 72 hours. Fasting Lipid Panel No results for input(s): CHOL, HDL, LDLCALC, TRIG, CHOLHDL, LDLDIRECT in the last 72 hours. Thyroid Function Tests No results for input(s): TSH, T4TOTAL, T3FREE, THYROIDAB in the last 72 hours.  Invalid input(s): FREET3  Other results:   Imaging    No results found.   Medications:  Scheduled Medications: . amiodarone  200 mg Oral BID  . amoxicillin-clavulanate  1 tablet Oral Q12H  . aspirin EC  81 mg Oral Daily  . chlorhexidine  60 mL Topical Once   And  . [START ON 01/27/2018] chlorhexidine  60 mL Topical Once  . [START ON 01/27/2018] chlorhexidine  60 mL Topical Once  . chlorhexidine  15 mL Mouth/Throat BID  . [START ON 01/27/2018] chlorhexidine  15 mL Mouth/Throat Once  . [START ON 01/27/2018] diazepam  2 mg Oral Once  . digoxin  0.125 mg Oral Daily  . enoxaparin (LOVENOX) injection  40 mg Subcutaneous Q24H  . feeding supplement (ENSURE ENLIVE)  237 mL Oral BID BM  . furosemide  80 mg Oral  BID  . [START ON 01/27/2018] insulin   Intravenous To OR  . [START ON 01/27/2018] magnesium sulfate  40 mEq Other To OR  . multivitamin with minerals  1 tablet Oral Daily  . mupirocin ointment  1 application Nasal BID  . nicotine  14 mg Transdermal Daily  . pantoprazole  40 mg Oral Daily  . [START ON 01/27/2018] phenylephrine  0-100 mcg/min Intravenous To OR  . [START ON 01/27/2018] potassium chloride  80 mEq Other To OR  . sodium chloride flush  10-40 mL Intracatheter Q12H  . sodium chloride flush  3 mL Intravenous Q12H  . sodium chloride flush  3 mL Intravenous Q12H  . spironolactone  12.5 mg Oral Daily  . [START ON 01/27/2018] tranexamic acid  15 mg/kg Intravenous To OR  . [START ON 01/27/2018] tranexamic acid  2 mg/kg Intracatheter To OR  . [START ON 01/27/2018] vancomycin  1,000 mg Other To OR    Infusions: . sodium chloride    . sodium chloride    . [START ON 01/27/2018] cefUROXime (ZINACEF)  IV    . [START ON 01/27/2018] cefUROXime (ZINACEF)  IV    . [START ON 01/27/2018] dexmedetomidine    . [START ON 01/27/2018] DOBUTamine    . [START ON 01/27/2018] DOPamine    . [START ON 01/27/2018] epinephrine    . [START ON 01/27/2018] fluconazole (DIFLUCAN) IV    . [START ON 01/27/2018] heparin 30,000 units/NS 1000 mL solution for CELLSAVER    . lactated ringers Stopped (01/20/18 1020)  . milrinone 0.25 mcg/kg/min (01/26/18 0158)  . [START ON 01/27/2018] milrinone    . [START ON 01/27/2018] nitroGLYCERIN    . [START ON 01/27/2018] norepinephrine (LEVOPHED) Adult infusion    . [START ON 01/27/2018] rifampin (RIFADIN) IVPB    . [START ON 01/27/2018] tranexamic acid (CYKLOKAPRON) infusion ([facility])    . [START ON 01/27/2018] vancomycin    . [START ON 01/27/2018] vasopressin (PITRESSIN) infusion - *FOR SHOCK*      PRN Medications: sodium chloride, sodium chloride, acetaminophen, albuterol, ALPRAZolam, HYDROcodone-acetaminophen, ondansetron (ZOFRAN) IV, sodium chloride flush, sodium  chloride flush, sodium chloride flush, temazepam  Assessment/Plan   1. ID: Patient presented with fever/chills/headache. Tm 100.1.  He had blood cultures drawn 10/4 and 10/5. BP soft but lactate normal.  PICC line site and ICD site look benign.  Of note, he had fever recently when he was in the hospital, resolved after removal of PICC line but no definite cause found. TEE last admission without vegetation on valves or ICD wires noted.  UA negative, CXR without PNA.  Now afebrile. Cultures remain negative. C. Diff negative but PCT 9.17 10/5. ID has seen and have not found definite localizing source.  Underwent dental extractions and gross debridement with  Dr Kristin Bruins. PICC line site and ICD site look benign. WBCs 10-> 9.0 -> 8.7  today.  - Per ID recs will continue Augmentin for possible dental infection x 7 days.    - Remains afebrile. PICC line site ok  2. Chronic Systolic CHF:  Nonischemic cardiomyopathy, diagnosed in 2011, initially followed at [facility].  LHC (3/19) with no significant CAD.  CPX (3/19) submaximal, but suggestive of mild to moderate HF limitation. HIV negative 3/19, history of drug/ETOH abuse in the past, no drugs now and had cut back ETOH considerably.  Echo 9/19 showed EF 20% with severe LV dilation, severe MR.  NYHA class IV symptoms. He has a wide LBBB.  RHC was done in 9/19 showing low output, CI 1.4.  He was admitted to start milrinone, unable to wean off milrinone and remains on 0.25 mcg/kg/min at home.  Medtronic ICD was placed, he has a His bundle lead but EP was unable to get an LV lead in place => His bundle pacing does not narrow his QRS and did not help [place] wean him off milrinone.  He has been doing well on milrinone 0.25 mcg/kg/min, NYHA class II.  RHC 10/11 shows filling pressures and cardiac output to be fairly well optimized.   - Co-ox 62% today. Continue milrinone 0.25   - Volume status ok. Continue lasix 80 mg po BID.   - Continue digoxin, level ok.   - Continue  spironolactone 12.5 daily.  - Unable to place LV lead as above.  - Not thought to be Mitraclip candidate (see below).  - Plan for LVAD tomorrow. He has been seen by Dr. Donata Clay this am.  We discussed procedure and post-op course.  - He may be an eventual transplant candidate if he stays off cigarettes.  3. ETOH Abuse: No longer drinking. No change.   4. Tobacco abuse: He has quit smoking since admission in 9/19. Will encouraged continued cessation.  5. Mitral regurgitation: Severe functional MR.  With LV dilated > 7 cm, suspect Mitraclip would be unlikely to help him appreciably.  No change for now.  6. VT: Patient had episode of VT requiring ATP.   - Continue amiodarone 200 mg bid.  - Keep K> 4.0 Mg > 2.0.  K 4.0 today. - No further VT.  Reuel Boom Bensimhon 01/26/2018 10:19 AM

## 2018-01-27 ENCOUNTER — Inpatient Hospital Stay (HOSPITAL_COMMUNITY): Payer: Medicaid Other | Admitting: Certified Registered Nurse Anesthetist

## 2018-01-27 ENCOUNTER — Inpatient Hospital Stay (HOSPITAL_COMMUNITY): Payer: Medicaid Other

## 2018-01-27 ENCOUNTER — Inpatient Hospital Stay (HOSPITAL_COMMUNITY)
Admit: 2018-01-27 | Discharge: 2018-01-27 | Disposition: A | Discharge status: Home / Self Care | Payer: Medicaid Other | Attending: Cardiothoracic Surgery | Admitting: Cardiothoracic Surgery

## 2018-01-27 ENCOUNTER — Ambulatory Visit: Payer: Medicaid Other

## 2018-01-27 ENCOUNTER — Ambulatory Visit (HOSPITAL_COMMUNITY): Admission: RE | Admit: 2018-01-27 | Payer: Medicaid Other | Source: Ambulatory Visit | Admitting: Cardiothoracic Surgery

## 2018-01-27 ENCOUNTER — Encounter (HOSPITAL_COMMUNITY)
Admission: EM | Disposition: A | Discharge status: Home Health | Payer: Self-pay | Source: Home / Self Care | Attending: Cardiology

## 2018-01-27 ENCOUNTER — Encounter (HOSPITAL_COMMUNITY): Payer: Self-pay | Admitting: Certified Registered Nurse Anesthetist

## 2018-01-27 ENCOUNTER — Encounter (HOSPITAL_COMMUNITY): Payer: Medicaid Other

## 2018-01-27 DIAGNOSIS — I255 Ischemic cardiomyopathy: Secondary | ICD-10-CM

## 2018-01-27 HISTORY — PX: TEE WITHOUT CARDIOVERSION: SHX5443

## 2018-01-27 HISTORY — PX: INSERTION OF IMPLANTABLE LEFT VENTRICULAR ASSIST DEVICE: SHX5866

## 2018-01-27 LAB — POCT I-STAT EG7
Acid-Base Excess: 4 mmol/L — ABNORMAL HIGH (ref 0.0–2.0)
BICARBONATE: 30.1 mmol/L — AB (ref 20.0–28.0)
CALCIUM ION: 1.09 mmol/L — AB (ref 1.15–1.40)
HCT: 29 % — ABNORMAL LOW (ref 39.0–52.0)
Hemoglobin: 9.9 g/dL — ABNORMAL LOW (ref 13.0–17.0)
O2 Saturation: 78 %
PCO2 VEN: 50.3 mmHg (ref 44.0–60.0)
PH VEN: 7.387 (ref 7.250–7.430)
PO2 VEN: 45 mmHg (ref 32.0–45.0)
POTASSIUM: 3.4 mmol/L — AB (ref 3.5–5.1)
Patient temperature: 37.4
Sodium: 139 mmol/L (ref 135–145)
TCO2: 32 mmol/L (ref 22–32)

## 2018-01-27 LAB — POCT I-STAT 3, ART BLOOD GAS (G3+)
ACID-BASE EXCESS: 1 mmol/L (ref 0.0–2.0)
Acid-Base Excess: 3 mmol/L — ABNORMAL HIGH (ref 0.0–2.0)
Acid-Base Excess: 4 mmol/L — ABNORMAL HIGH (ref 0.0–2.0)
Acid-Base Excess: 4 mmol/L — ABNORMAL HIGH (ref 0.0–2.0)
BICARBONATE: 28.5 mmol/L — AB (ref 20.0–28.0)
BICARBONATE: 28.9 mmol/L — AB (ref 20.0–28.0)
Bicarbonate: 26.1 mmol/L (ref 20.0–28.0)
Bicarbonate: 28.5 mmol/L — ABNORMAL HIGH (ref 20.0–28.0)
O2 SAT: 98 %
O2 SAT: 100 %
O2 Saturation: 100 %
O2 Saturation: 100 %
PCO2 ART: 48.4 mmHg — AB (ref 32.0–48.0)
PCO2 ART: 44 mmHg (ref 32.0–48.0)
PCO2 ART: 45 mmHg (ref 32.0–48.0)
PH ART: 7.378 (ref 7.350–7.450)
PO2 ART: 379 mmHg — AB (ref 83.0–108.0)
Patient temperature: 37.4
TCO2: 30 mmol/L (ref 22–32)
TCO2: 30 mmol/L (ref 22–32)
TCO2: 27 mmol/L (ref 22–32)
TCO2: 30 mmol/L (ref 22–32)
pCO2 arterial: 43.9 mmHg (ref 32.0–48.0)
pH, Arterial: 7.426 (ref 7.350–7.450)
pH, Arterial: 7.373 (ref 7.350–7.450)
pH, Arterial: 7.423 (ref 7.350–7.450)
pO2, Arterial: 173 mmHg — ABNORMAL HIGH (ref 83.0–108.0)
pO2, Arterial: 104 mmHg (ref 83.0–108.0)
pO2, Arterial: 195 mmHg — ABNORMAL HIGH (ref 83.0–108.0)

## 2018-01-27 LAB — CBC WITH DIFFERENTIAL/PLATELET
ABS IMMATURE GRANULOCYTES: 0.03 10*3/uL (ref 0.00–0.07)
BASOS ABS: 0 10*3/uL (ref 0.0–0.1)
Basophils Absolute: 0.1 10*3/uL (ref 0.0–0.1)
Basophils Relative: 0 %
Basophils Relative: 1 %
Eosinophils Absolute: 0.3 10*3/uL (ref 0.0–0.5)
Eosinophils Absolute: 0.1 10*3/uL (ref 0.0–0.5)
Eosinophils Relative: 4 %
Eosinophils Relative: 1 %
HCT: 34.9 % — ABNORMAL LOW (ref 39.0–52.0)
HEMATOCRIT: 36.6 % — AB (ref 39.0–52.0)
HEMOGLOBIN: 13.1 g/dL (ref 13.0–17.0)
Hemoglobin: 12 g/dL — ABNORMAL LOW (ref 13.0–17.0)
Immature Granulocytes: 0 %
LYMPHS ABS: 1.6 10*3/uL (ref 0.7–4.0)
Lymphocytes Relative: 17 %
Lymphocytes Relative: 10 %
Lymphs Abs: 1.4 10*3/uL (ref 0.7–4.0)
MCH: 32.3 pg (ref 26.0–34.0)
MCH: 31.1 pg (ref 26.0–34.0)
MCHC: 35.8 g/dL (ref 30.0–36.0)
MCHC: 34.4 g/dL (ref 30.0–36.0)
MCV: 90.1 fL (ref 80.0–100.0)
MCV: 90.4 fL (ref 80.0–100.0)
Monocytes Absolute: 1 10*3/uL (ref 0.1–1.0)
Monocytes Absolute: 1.1 10*3/uL — ABNORMAL HIGH (ref 0.1–1.0)
Monocytes Relative: 11 %
Monocytes Relative: 8 %
NEUTROS ABS: 6.1 10*3/uL (ref 1.7–7.7)
NEUTROS PCT: 68 %
NRBC: 0 % (ref 0.0–0.2)
Neutro Abs: 10.8 10*3/uL — ABNORMAL HIGH (ref 1.7–7.7)
Neutrophils Relative %: 80 %
Platelets: 261 10*3/uL (ref 150–400)
Platelets: 177 10*3/uL (ref 150–400)
RBC: 4.06 MIL/uL — ABNORMAL LOW (ref 4.22–5.81)
RBC: 3.86 MIL/uL — ABNORMAL LOW (ref 4.22–5.81)
RDW: 12.8 % (ref 11.5–15.5)
RDW: 12.8 % (ref 11.5–15.5)
WBC: 9 10*3/uL (ref 4.0–10.5)
WBC: 13.4 10*3/uL — ABNORMAL HIGH (ref 4.0–10.5)

## 2018-01-27 LAB — CBC
HCT: 27.5 % — ABNORMAL LOW (ref 39.0–52.0)
Hemoglobin: 9.6 g/dL — ABNORMAL LOW (ref 13.0–17.0)
MCH: 31.9 pg (ref 26.0–34.0)
MCHC: 34.9 g/dL (ref 30.0–36.0)
MCV: 91.4 fL (ref 80.0–100.0)
Platelets: 168 10*3/uL (ref 150–400)
RBC: 3.01 MIL/uL — ABNORMAL LOW (ref 4.22–5.81)
RDW: 13.2 % (ref 11.5–15.5)
WBC: 11.4 10*3/uL — ABNORMAL HIGH (ref 4.0–10.5)
nRBC: 0 % (ref 0.0–0.2)

## 2018-01-27 LAB — DIC (DISSEMINATED INTRAVASCULAR COAGULATION)PANEL
D-Dimer, Quant: 2.35 ug/mL-FEU — ABNORMAL HIGH (ref 0.00–0.50)
Fibrinogen: 395 mg/dL (ref 210–475)
INR: 1.26
Platelets: 192 10*3/uL (ref 150–400)
Prothrombin Time: 15.7 seconds — ABNORMAL HIGH (ref 11.4–15.2)
Smear Review: NONE SEEN
aPTT: 38 seconds — ABNORMAL HIGH (ref 24–36)

## 2018-01-27 LAB — POCT I-STAT, CHEM 8
BUN: 20 mg/dL (ref 6–20)
BUN: 20 mg/dL (ref 6–20)
BUN: 19 mg/dL (ref 6–20)
BUN: 33 mg/dL — ABNORMAL HIGH (ref 6–20)
BUN: 18 mg/dL (ref 6–20)
BUN: 18 mg/dL (ref 6–20)
BUN: 19 mg/dL (ref 6–20)
BUN: 17 mg/dL (ref 6–20)
CALCIUM ION: 1.27 mmol/L (ref 1.15–1.40)
CALCIUM ION: 1.02 mmol/L — AB (ref 1.15–1.40)
CALCIUM ION: 1.14 mmol/L — AB (ref 1.15–1.40)
CHLORIDE: 94 mmol/L — AB (ref 98–111)
CHLORIDE: 94 mmol/L — AB (ref 98–111)
CREATININE: 1 mg/dL (ref 0.61–1.24)
CREATININE: 1 mg/dL (ref 0.61–1.24)
Calcium, Ion: 1.19 mmol/L (ref 1.15–1.40)
Calcium, Ion: 1.08 mmol/L — ABNORMAL LOW (ref 1.15–1.40)
Calcium, Ion: 1.08 mmol/L — ABNORMAL LOW (ref 1.15–1.40)
Calcium, Ion: 1.08 mmol/L — ABNORMAL LOW (ref 1.15–1.40)
Calcium, Ion: 1.14 mmol/L — ABNORMAL LOW (ref 1.15–1.40)
Chloride: 94 mmol/L — ABNORMAL LOW (ref 98–111)
Chloride: 96 mmol/L — ABNORMAL LOW (ref 98–111)
Chloride: 92 mmol/L — ABNORMAL LOW (ref 98–111)
Chloride: 94 mmol/L — ABNORMAL LOW (ref 98–111)
Chloride: 95 mmol/L — ABNORMAL LOW (ref 98–111)
Chloride: 98 mmol/L (ref 98–111)
Creatinine, Ser: 1.1 mg/dL (ref 0.61–1.24)
Creatinine, Ser: 0.9 mg/dL (ref 0.61–1.24)
Creatinine, Ser: 0.9 mg/dL (ref 0.61–1.24)
Creatinine, Ser: 0.9 mg/dL (ref 0.61–1.24)
Creatinine, Ser: 1 mg/dL (ref 0.61–1.24)
Creatinine, Ser: 1 mg/dL (ref 0.61–1.24)
GLUCOSE: 172 mg/dL — AB (ref 70–99)
GLUCOSE: 134 mg/dL — AB (ref 70–99)
Glucose, Bld: 103 mg/dL — ABNORMAL HIGH (ref 70–99)
Glucose, Bld: 202 mg/dL — ABNORMAL HIGH (ref 70–99)
Glucose, Bld: 195 mg/dL — ABNORMAL HIGH (ref 70–99)
Glucose, Bld: 107 mg/dL — ABNORMAL HIGH (ref 70–99)
Glucose, Bld: 144 mg/dL — ABNORMAL HIGH (ref 70–99)
Glucose, Bld: 109 mg/dL — ABNORMAL HIGH (ref 70–99)
HCT: 26 % — ABNORMAL LOW (ref 39.0–52.0)
HCT: 27 % — ABNORMAL LOW (ref 39.0–52.0)
HEMATOCRIT: 37 % — AB (ref 39.0–52.0)
HEMATOCRIT: 34 % — AB (ref 39.0–52.0)
HEMATOCRIT: 27 % — AB (ref 39.0–52.0)
HEMATOCRIT: 28 % — AB (ref 39.0–52.0)
HEMATOCRIT: 28 % — AB (ref 39.0–52.0)
HEMATOCRIT: 29 % — AB (ref 39.0–52.0)
HEMOGLOBIN: 11.6 g/dL — AB (ref 13.0–17.0)
HEMOGLOBIN: 9.2 g/dL — AB (ref 13.0–17.0)
HEMOGLOBIN: 9.5 g/dL — AB (ref 13.0–17.0)
HEMOGLOBIN: 9.5 g/dL — AB (ref 13.0–17.0)
HEMOGLOBIN: 9.9 g/dL — AB (ref 13.0–17.0)
HEMOGLOBIN: 9.2 g/dL — AB (ref 13.0–17.0)
Hemoglobin: 12.6 g/dL — ABNORMAL LOW (ref 13.0–17.0)
Hemoglobin: 8.8 g/dL — ABNORMAL LOW (ref 13.0–17.0)
POTASSIUM: 3.1 mmol/L — AB (ref 3.5–5.1)
POTASSIUM: 3.1 mmol/L — AB (ref 3.5–5.1)
POTASSIUM: 3.2 mmol/L — AB (ref 3.5–5.1)
POTASSIUM: 3.2 mmol/L — AB (ref 3.5–5.1)
Potassium: 4.2 mmol/L (ref 3.5–5.1)
Potassium: 3.6 mmol/L (ref 3.5–5.1)
Potassium: 3.8 mmol/L (ref 3.5–5.1)
Potassium: 5.5 mmol/L — ABNORMAL HIGH (ref 3.5–5.1)
SODIUM: 135 mmol/L (ref 135–145)
SODIUM: 135 mmol/L (ref 135–145)
SODIUM: 133 mmol/L — AB (ref 135–145)
SODIUM: 135 mmol/L (ref 135–145)
SODIUM: 134 mmol/L — AB (ref 135–145)
SODIUM: 135 mmol/L (ref 135–145)
SODIUM: 134 mmol/L — AB (ref 135–145)
Sodium: 137 mmol/L (ref 135–145)
TCO2: 35 mmol/L — AB (ref 22–32)
TCO2: 28 mmol/L (ref 22–32)
TCO2: 29 mmol/L (ref 22–32)
TCO2: 30 mmol/L (ref 22–32)
TCO2: 30 mmol/L (ref 22–32)
TCO2: 31 mmol/L (ref 22–32)
TCO2: 28 mmol/L (ref 22–32)
TCO2: 28 mmol/L (ref 22–32)

## 2018-01-27 LAB — BASIC METABOLIC PANEL
ANION GAP: 8 (ref 5–15)
Anion gap: 9 (ref 5–15)
BUN: 18 mg/dL (ref 6–20)
BUN: 16 mg/dL (ref 6–20)
CHLORIDE: 96 mmol/L — AB (ref 98–111)
CO2: 32 mmol/L (ref 22–32)
CO2: 29 mmol/L (ref 22–32)
CREATININE: 1.1 mg/dL (ref 0.61–1.24)
Calcium: 9.7 mg/dL (ref 8.9–10.3)
Calcium: 8.4 mg/dL — ABNORMAL LOW (ref 8.9–10.3)
Chloride: 100 mmol/L (ref 98–111)
Creatinine, Ser: 0.96 mg/dL (ref 0.61–1.24)
GFR calc Af Amer: >60 mL/min (ref >60)
GFR calc Af Amer: >60 mL/min (ref >60)
GFR calc non Af Amer: >60 mL/min (ref >60)
GFR calc non Af Amer: >60 mL/min (ref >60)
Glucose, Bld: 99 mg/dL (ref 70–99)
Glucose, Bld: 60 mg/dL — ABNORMAL LOW (ref 70–99)
POTASSIUM: 4 mmol/L (ref 3.5–5.1)
Potassium: 3.2 mmol/L — ABNORMAL LOW (ref 3.5–5.1)
Sodium: 136 mmol/L (ref 135–145)
Sodium: 138 mmol/L (ref 135–145)

## 2018-01-27 LAB — GLUCOSE, CAPILLARY
GLUCOSE-CAPILLARY: 112 mg/dL — AB (ref 70–99)
GLUCOSE-CAPILLARY: 127 mg/dL — AB (ref 70–99)
GLUCOSE-CAPILLARY: 110 mg/dL — AB (ref 70–99)
Glucose-Capillary: 91 mg/dL (ref 70–99)

## 2018-01-27 LAB — POCT I-STAT 4, (NA,K, GLUC, HGB,HCT)
Glucose, Bld: 64 mg/dL — ABNORMAL LOW (ref 70–99)
HCT: 34 % — ABNORMAL LOW (ref 39.0–52.0)
Hemoglobin: 11.6 g/dL — ABNORMAL LOW (ref 13.0–17.0)
POTASSIUM: 3.2 mmol/L — AB (ref 3.5–5.1)
SODIUM: 138 mmol/L (ref 135–145)

## 2018-01-27 LAB — MAGNESIUM
Magnesium: 2.1 mg/dL (ref 1.7–2.4)
Magnesium: 3.4 mg/dL — ABNORMAL HIGH (ref 1.7–2.4)

## 2018-01-27 LAB — COOXEMETRY PANEL
Carboxyhemoglobin: 1.6 % — ABNORMAL HIGH (ref 0.5–1.5)
Carboxyhemoglobin: 1.2 % (ref 0.5–1.5)
Methemoglobin: 0.9 % (ref 0.0–1.5)
Methemoglobin: 1.8 % — ABNORMAL HIGH (ref 0.0–1.5)
O2 Saturation: 59.8 %
O2 Saturation: 73 %
Total hemoglobin: 13.6 g/dL (ref 12.0–16.0)
Total hemoglobin: 12.4 g/dL (ref 12.0–16.0)

## 2018-01-27 LAB — CREATININE, SERUM
Creatinine, Ser: 1.07 mg/dL (ref 0.61–1.24)
GFR calc Af Amer: >60 mL/min (ref >60)
GFR calc non Af Amer: >60 mL/min (ref >60)

## 2018-01-27 LAB — FIBRINOGEN: Fibrinogen: 297 mg/dL (ref 210–475)

## 2018-01-27 LAB — HEMOGLOBIN AND HEMATOCRIT, BLOOD
HCT: 25.6 % — ABNORMAL LOW (ref 39.0–52.0)
Hemoglobin: 9.3 g/dL — ABNORMAL LOW (ref 13.0–17.0)

## 2018-01-27 LAB — PLATELET COUNT: Platelets: 161 10*3/uL (ref 150–400)

## 2018-01-27 SURGERY — INSERTION OF IMPLANTABLE LEFT VENTRICULAR ASSIST DEVICE
Anesthesia: General | Site: Chest

## 2018-01-27 MED ORDER — SODIUM CHLORIDE 0.9 % IV SOLN
20.0000 ug | INTRAVENOUS | Status: AC
  Administered 2018-01-27: 20 ug via INTRAVENOUS
  Filled 2018-01-27: qty 5

## 2018-01-27 MED ORDER — EPINEPHRINE PF 1 MG/ML IJ SOLN
0.0000 ug/min | INTRAVENOUS | Status: DC
  Filled 2018-01-27 (×2): qty 4

## 2018-01-27 MED ORDER — ROCURONIUM BROMIDE 10 MG/ML (PF) SYRINGE
PREFILLED_SYRINGE | INTRAVENOUS | Status: DC | PRN
  Administered 2018-01-27 (×2): 50 mg via INTRAVENOUS
  Administered 2018-01-27: 30 mg via INTRAVENOUS
  Administered 2018-01-27: 50 mg via INTRAVENOUS

## 2018-01-27 MED ORDER — MORPHINE SULFATE (PF) 2 MG/ML IV SOLN
2.0000 mg | INTRAVENOUS | Status: DC | PRN
  Administered 2018-01-28 (×4): 4 mg via INTRAVENOUS
  Administered 2018-01-28: 2 mg via INTRAVENOUS
  Administered 2018-01-28: 4 mg via INTRAVENOUS
  Administered 2018-01-28 – 2018-01-30 (×2): 2 mg via INTRAVENOUS
  Filled 2018-01-27: qty 2
  Filled 2018-01-27: qty 1
  Filled 2018-01-27 (×2): qty 2
  Filled 2018-01-27 (×2): qty 1
  Filled 2018-01-27 (×2): qty 2
  Filled 2018-01-27: qty 1
  Filled 2018-01-27: qty 2
  Filled 2018-01-27: qty 1

## 2018-01-27 MED ORDER — SODIUM CHLORIDE 0.9 % IJ SOLN
INTRAMUSCULAR | Status: DC | PRN
  Administered 2018-01-27: 1000 mL via INTRAVENOUS

## 2018-01-27 MED ORDER — METOCLOPRAMIDE HCL 5 MG/ML IJ SOLN
10.0000 mg | Freq: Four times a day (QID) | INTRAMUSCULAR | Status: AC
  Administered 2018-01-27 – 2018-02-01 (×19): 10 mg via INTRAVENOUS
  Filled 2018-01-27 (×18): qty 2

## 2018-01-27 MED ORDER — FUROSEMIDE 10 MG/ML IJ SOLN
INTRAMUSCULAR | Status: DC | PRN
  Administered 2018-01-27: 40 mg via INTRAMUSCULAR

## 2018-01-27 MED ORDER — DEXMEDETOMIDINE HCL IN NACL 400 MCG/100ML IV SOLN
0.1000 ug/kg/h | INTRAVENOUS | Status: DC
  Administered 2018-01-27 – 2018-01-28 (×3): 0.7 ug/kg/h via INTRAVENOUS
  Filled 2018-01-27 (×2): qty 100

## 2018-01-27 MED ORDER — PHENYLEPHRINE 40 MCG/ML (10ML) SYRINGE FOR IV PUSH (FOR BLOOD PRESSURE SUPPORT)
PREFILLED_SYRINGE | INTRAVENOUS | Status: DC | PRN
  Administered 2018-01-27: 40 ug via INTRAVENOUS
  Administered 2018-01-27: 80 ug via INTRAVENOUS

## 2018-01-27 MED ORDER — HEPARIN SODIUM (PORCINE) 1000 UNIT/ML IJ SOLN
INTRAMUSCULAR | Status: DC | PRN
  Administered 2018-01-27: 23000 [IU] via INTRAVENOUS

## 2018-01-27 MED ORDER — LACTATED RINGERS IV SOLN: INTRAVENOUS | Status: DC

## 2018-01-27 MED ORDER — CHLORHEXIDINE GLUCONATE 4 % EX LIQD
CUTANEOUS | Status: AC
  Administered 2018-01-27: 4 via TOPICAL
  Filled 2018-01-27: qty 60

## 2018-01-27 MED ORDER — CHLORHEXIDINE GLUCONATE 0.12% ORAL RINSE (MEDLINE KIT)
15.0000 mL | Freq: Two times a day (BID) | OROMUCOSAL | Status: DC
  Administered 2018-01-27 – 2018-01-29 (×3): 15 mL via OROMUCOSAL

## 2018-01-27 MED ORDER — SODIUM CHLORIDE 0.9 % IV SOLN: 250.0000 mL | INTRAVENOUS | Status: DC

## 2018-01-27 MED ORDER — SODIUM CHLORIDE 0.9 % IR SOLN
Status: DC | PRN
  Administered 2018-01-27: 5000 mL

## 2018-01-27 MED ORDER — RIFAMPIN 300 MG PO CAPS
600.0000 mg | ORAL_CAPSULE | Freq: Once | ORAL | Status: AC
  Administered 2018-01-28: 600 mg via ORAL
  Filled 2018-01-27: qty 2

## 2018-01-27 MED ORDER — MORPHINE SULFATE (PF) 2 MG/ML IV SOLN
1.0000 mg | INTRAVENOUS | Status: AC | PRN
  Administered 2018-01-27: 4 mg via INTRAVENOUS
  Administered 2018-01-27: 2 mg via INTRAVENOUS
  Administered 2018-01-27: 4 mg via INTRAVENOUS
  Administered 2018-01-27 (×3): 2 mg via INTRAVENOUS
  Filled 2018-01-27 (×2): qty 2

## 2018-01-27 MED ORDER — AMIODARONE HCL IN DEXTROSE 360-4.14 MG/200ML-% IV SOLN
30.0000 mg/h | INTRAVENOUS | Status: DC
  Administered 2018-01-27 – 2018-01-28 (×3): 30 mg/h via INTRAVENOUS
  Filled 2018-01-27 (×3): qty 200

## 2018-01-27 MED ORDER — ACETAMINOPHEN 160 MG/5ML PO SOLN
1000.0000 mg | Freq: Four times a day (QID) | ORAL | Status: AC
  Administered 2018-01-27 – 2018-01-28 (×3): 1000 mg
  Filled 2018-01-27 (×3): qty 40.6

## 2018-01-27 MED ORDER — FAMOTIDINE IN NACL 20-0.9 MG/50ML-% IV SOLN
20.0000 mg | Freq: Two times a day (BID) | INTRAVENOUS | Status: AC
  Administered 2018-01-27 (×2): 20 mg via INTRAVENOUS
  Filled 2018-01-27: qty 50

## 2018-01-27 MED ORDER — VANCOMYCIN HCL 1000 MG IV SOLR
INTRAVENOUS | Status: DC | PRN
  Administered 2018-01-27: 1000 mg

## 2018-01-27 MED ORDER — SODIUM CHLORIDE 0.9% FLUSH
3.0000 mL | Freq: Two times a day (BID) | INTRAVENOUS | Status: DC
  Administered 2018-01-28 – 2018-02-08 (×12): 3 mL via INTRAVENOUS

## 2018-01-27 MED ORDER — INSULIN ASPART 100 UNIT/ML ~~LOC~~ SOLN
0.0000 [IU] | SUBCUTANEOUS | Status: DC
  Administered 2018-01-27: 2 [IU] via SUBCUTANEOUS
  Administered 2018-01-28 (×2): 4 [IU] via SUBCUTANEOUS
  Administered 2018-01-29 (×4): 2 [IU] via SUBCUTANEOUS

## 2018-01-27 MED ORDER — MILRINONE LACTATE IN DEXTROSE 20-5 MG/100ML-% IV SOLN
0.3000 ug/kg/min | INTRAVENOUS | Status: DC
  Administered 2018-01-27: 0.375 ug/kg/min via INTRAVENOUS
  Administered 2018-01-27: 0.3 ug/kg/min via INTRAVENOUS
  Filled 2018-01-27: qty 100

## 2018-01-27 MED ORDER — INSULIN REGULAR BOLUS VIA INFUSION
0.0000 [IU] | Freq: Three times a day (TID) | INTRAVENOUS | Status: DC
  Filled 2018-01-27: qty 10

## 2018-01-27 MED ORDER — SODIUM CHLORIDE 0.9 % IV SOLN
INTRAVENOUS | Status: DC
  Administered 2018-01-27: 14:00:00 via INTRAVENOUS

## 2018-01-27 MED ORDER — HEMOSTATIC AGENTS (NO CHARGE) OPTIME
TOPICAL | Status: DC | PRN
  Administered 2018-01-27 (×4): 1 via TOPICAL

## 2018-01-27 MED ORDER — ACETAMINOPHEN 650 MG RE SUPP
650.0000 mg | Freq: Once | RECTAL | Status: AC
  Administered 2018-01-27: 650 mg via RECTAL

## 2018-01-27 MED ORDER — LACTATED RINGERS IV SOLN
INTRAVENOUS | Status: DC | PRN
  Administered 2018-01-27: 07:00:00 via INTRAVENOUS

## 2018-01-27 MED ORDER — LACTATED RINGERS IV SOLN
INTRAVENOUS | Status: DC
  Administered 2018-01-27 – 2018-01-28 (×2): via INTRAVENOUS

## 2018-01-27 MED ORDER — PROTAMINE SULFATE 10 MG/ML IV SOLN
INTRAVENOUS | Status: DC | PRN
  Administered 2018-01-27: 230 mg via INTRAVENOUS

## 2018-01-27 MED ORDER — NITROGLYCERIN IN D5W 200-5 MCG/ML-% IV SOLN
0.0000 ug/min | INTRAVENOUS | Status: DC
  Administered 2018-01-27: 20 ug/min via INTRAVENOUS
  Administered 2018-01-28: 5 ug/min via INTRAVENOUS
  Filled 2018-01-27: qty 250

## 2018-01-27 MED ORDER — POTASSIUM CHLORIDE 10 MEQ/50ML IV SOLN
10.0000 meq | Freq: Once | INTRAVENOUS | Status: AC
  Administered 2018-01-27: 10 meq via INTRAVENOUS

## 2018-01-27 MED ORDER — ACETAMINOPHEN 160 MG/5ML PO SOLN: 650.0000 mg | Freq: Once | ORAL | Status: AC

## 2018-01-27 MED ORDER — ASPIRIN 300 MG RE SUPP: 300.0000 mg | Freq: Every day | RECTAL | Status: DC

## 2018-01-27 MED ORDER — FLUCONAZOLE IN SODIUM CHLORIDE 400-0.9 MG/200ML-% IV SOLN
400.0000 mg | Freq: Once | INTRAVENOUS | Status: AC
  Administered 2018-01-28: 400 mg via INTRAVENOUS
  Filled 2018-01-27: qty 200

## 2018-01-27 MED ORDER — SODIUM CHLORIDE 0.9% FLUSH: 3.0000 mL | INTRAVENOUS | Status: DC | PRN

## 2018-01-27 MED ORDER — ALBUMIN HUMAN 5 % IV SOLN
250.0000 mL | INTRAVENOUS | Status: DC | PRN
  Administered 2018-01-27 (×5): 12.5 g via INTRAVENOUS
  Filled 2018-01-27 (×2): qty 500

## 2018-01-27 MED ORDER — LACTATED RINGERS IV SOLN
INTRAVENOUS | Status: DC | PRN
  Administered 2018-01-27 (×2): via INTRAVENOUS

## 2018-01-27 MED ORDER — ETOMIDATE 2 MG/ML IV SOLN
INTRAVENOUS | Status: DC | PRN
  Administered 2018-01-27: 4 mg via INTRAVENOUS

## 2018-01-27 MED ORDER — ACETAMINOPHEN 500 MG PO TABS
1000.0000 mg | ORAL_TABLET | Freq: Four times a day (QID) | ORAL | Status: AC
  Administered 2018-01-28 – 2018-02-01 (×17): 1000 mg via ORAL
  Filled 2018-01-27 (×17): qty 2

## 2018-01-27 MED ORDER — DOCUSATE SODIUM 100 MG PO CAPS
200.0000 mg | ORAL_CAPSULE | Freq: Every day | ORAL | Status: DC
  Administered 2018-01-29 – 2018-02-10 (×10): 200 mg via ORAL
  Filled 2018-01-27 (×10): qty 2

## 2018-01-27 MED ORDER — SODIUM CHLORIDE 0.45 % IV SOLN: INTRAVENOUS | Status: DC | PRN

## 2018-01-27 MED ORDER — BISACODYL 5 MG PO TBEC
10.0000 mg | DELAYED_RELEASE_TABLET | Freq: Every day | ORAL | Status: DC
  Administered 2018-01-29 – 2018-02-10 (×9): 10 mg via ORAL
  Filled 2018-01-27 (×10): qty 2

## 2018-01-27 MED ORDER — TRANEXAMIC ACID 1000 MG/10ML IV SOLN
INTRAVENOUS | Status: DC | PRN
  Administered 2018-01-27: 1.5 mg/kg/h via INTRAVENOUS

## 2018-01-27 MED ORDER — VANCOMYCIN HCL IN DEXTROSE 1-5 GM/200ML-% IV SOLN: 1000.0000 mg | Freq: Two times a day (BID) | INTRAVENOUS | Status: DC

## 2018-01-27 MED ORDER — LACTATED RINGERS IV SOLN
INTRAVENOUS | Status: DC | PRN
  Administered 2018-01-27: 08:00:00 via INTRAVENOUS

## 2018-01-27 MED ORDER — VANCOMYCIN HCL IN DEXTROSE 750-5 MG/150ML-% IV SOLN
750.0000 mg | Freq: Two times a day (BID) | INTRAVENOUS | Status: DC
  Administered 2018-01-27 – 2018-01-28 (×2): 750 mg via INTRAVENOUS
  Filled 2018-01-27 (×2): qty 150

## 2018-01-27 MED ORDER — CHLORHEXIDINE GLUCONATE 0.12 % MT SOLN
15.0000 mL | OROMUCOSAL | Status: AC
  Administered 2018-01-27: 15 mL via OROMUCOSAL

## 2018-01-27 MED ORDER — SODIUM CHLORIDE 0.9 % IV SOLN
1.5000 g | Freq: Two times a day (BID) | INTRAVENOUS | Status: DC
  Administered 2018-01-27 – 2018-01-28 (×2): 1.5 g via INTRAVENOUS
  Filled 2018-01-27 (×3): qty 1.5

## 2018-01-27 MED ORDER — ASPIRIN EC 325 MG PO TBEC
325.0000 mg | DELAYED_RELEASE_TABLET | Freq: Every day | ORAL | Status: DC
  Administered 2018-01-29 – 2018-01-30 (×2): 325 mg via ORAL
  Filled 2018-01-27 (×2): qty 1

## 2018-01-27 MED ORDER — TRAMADOL HCL 50 MG PO TABS
50.0000 mg | ORAL_TABLET | ORAL | Status: DC | PRN
  Administered 2018-01-30 – 2018-02-10 (×34): 100 mg via ORAL
  Filled 2018-01-27 (×40): qty 2

## 2018-01-27 MED ORDER — OXYCODONE HCL 5 MG PO TABS
5.0000 mg | ORAL_TABLET | ORAL | Status: DC | PRN
  Administered 2018-01-28 – 2018-01-29 (×4): 10 mg via ORAL
  Administered 2018-01-29: 5 mg via ORAL
  Administered 2018-01-29 (×6): 10 mg via ORAL
  Administered 2018-01-30: 5 mg via ORAL
  Administered 2018-01-30 – 2018-02-03 (×33): 10 mg via ORAL
  Administered 2018-02-04: 5 mg via ORAL
  Administered 2018-02-04 (×3): 10 mg via ORAL
  Administered 2018-02-04: 5 mg via ORAL
  Administered 2018-02-04 – 2018-02-09 (×24): 10 mg via ORAL
  Administered 2018-02-09: 5 mg via ORAL
  Administered 2018-02-09 – 2018-02-10 (×7): 10 mg via ORAL
  Filled 2018-01-27 (×25): qty 2
  Filled 2018-01-27: qty 1
  Filled 2018-01-27 (×33): qty 2
  Filled 2018-01-27: qty 1
  Filled 2018-01-27: qty 2
  Filled 2018-01-27: qty 1
  Filled 2018-01-27 (×2): qty 2
  Filled 2018-01-27: qty 1
  Filled 2018-01-27 (×2): qty 2
  Filled 2018-01-27: qty 1
  Filled 2018-01-27 (×17): qty 2

## 2018-01-27 MED ORDER — PANTOPRAZOLE SODIUM 40 MG PO TBEC
40.0000 mg | DELAYED_RELEASE_TABLET | Freq: Every day | ORAL | Status: DC
  Administered 2018-01-29 – 2018-02-10 (×13): 40 mg via ORAL
  Filled 2018-01-27 (×14): qty 1

## 2018-01-27 MED ORDER — SODIUM CHLORIDE 0.9 % IV SOLN
INTRAVENOUS | Status: DC | PRN
  Administered 2018-01-27: 750 mg via INTRAVENOUS

## 2018-01-27 MED ORDER — MAGNESIUM SULFATE 4 GM/100ML IV SOLN
4.0000 g | Freq: Once | INTRAVENOUS | Status: AC
  Administered 2018-01-27: 4 g via INTRAVENOUS

## 2018-01-27 MED ORDER — MIDAZOLAM HCL 2 MG/2ML IJ SOLN
2.0000 mg | INTRAMUSCULAR | Status: DC | PRN
  Administered 2018-01-27 – 2018-01-28 (×2): 2 mg via INTRAVENOUS
  Filled 2018-01-27 (×4): qty 2

## 2018-01-27 MED ORDER — SODIUM CHLORIDE 0.9 % IV SOLN
INTRAVENOUS | Status: DC | PRN
  Administered 2018-01-27: .9 [IU]/h via INTRAVENOUS

## 2018-01-27 MED ORDER — ASPIRIN 81 MG PO CHEW: 324.0000 mg | CHEWABLE_TABLET | Freq: Every day | ORAL | Status: DC

## 2018-01-27 MED ORDER — ONDANSETRON HCL 4 MG/2ML IJ SOLN: 4.0000 mg | Freq: Four times a day (QID) | INTRAMUSCULAR | Status: DC | PRN

## 2018-01-27 MED ORDER — BISACODYL 10 MG RE SUPP: 10.0000 mg | Freq: Every day | RECTAL | Status: DC

## 2018-01-27 MED ORDER — ORAL CARE MOUTH RINSE
15.0000 mL | OROMUCOSAL | Status: DC
  Administered 2018-01-27 – 2018-01-28 (×8): 15 mL via OROMUCOSAL

## 2018-01-27 MED ORDER — POTASSIUM CHLORIDE 10 MEQ/50ML IV SOLN
10.0000 meq | INTRAVENOUS | Status: AC
  Administered 2018-01-27 (×3): 10 meq via INTRAVENOUS

## 2018-01-27 MED ORDER — INSULIN REGULAR(HUMAN) IN NACL 100-0.9 UT/100ML-% IV SOLN: INTRAVENOUS | Status: DC

## 2018-01-27 MED ORDER — NOREPINEPHRINE 4 MG/250ML-% IV SOLN
0.0000 ug/min | INTRAVENOUS | Status: DC
  Administered 2018-01-28: 7 ug/min via INTRAVENOUS
  Filled 2018-01-27 (×2): qty 250

## 2018-01-27 MED ORDER — MIDAZOLAM HCL 5 MG/5ML IJ SOLN
INTRAMUSCULAR | Status: DC | PRN
  Administered 2018-01-27: 2 mg via INTRAVENOUS
  Administered 2018-01-27: 5 mg via INTRAVENOUS
  Administered 2018-01-27: 3 mg via INTRAVENOUS

## 2018-01-27 MED ORDER — FENTANYL CITRATE (PF) 250 MCG/5ML IJ SOLN
INTRAMUSCULAR | Status: DC | PRN
  Administered 2018-01-27 (×2): 250 ug via INTRAVENOUS
  Administered 2018-01-27: 400 ug via INTRAVENOUS
  Administered 2018-01-27: 100 ug via INTRAVENOUS
  Administered 2018-01-27: 150 ug via INTRAVENOUS
  Administered 2018-01-27: 100 ug via INTRAVENOUS

## 2018-01-27 MED ORDER — CLEVIDIPINE BUTYRATE 0.5 MG/ML IV EMUL
1.0000 mg/h | INTRAVENOUS | Status: DC
  Administered 2018-01-27: 2 mg/h via INTRAVENOUS
  Filled 2018-01-27: qty 100

## 2018-01-27 SURGICAL SUPPLY — 122 items
ADAPTER CARDIO PERF ANTE/RETRO (ADAPTER) IMPLANT
ANTEGRADE CPLG (MISCELLANEOUS) IMPLANT
BAG DECANTER FOR FLEXI CONT (MISCELLANEOUS) ×16 IMPLANT
BLADE STERNUM SYSTEM 6 (BLADE) ×4 IMPLANT
BLADE SURG 12 STRL SS (BLADE) ×4 IMPLANT
BLADE SURG 15 STRL LF DISP TIS (BLADE) IMPLANT
BLADE SURG 15 STRL SS (BLADE)
CANISTER SUCT 3000ML PPV (MISCELLANEOUS) ×4 IMPLANT
CANNULA ARTERIAL NVNT 3/8 20FR (MISCELLANEOUS) ×4 IMPLANT
CANNULA VENOUS LOW PROF 34X46 (CANNULA) ×4 IMPLANT
CATH FOLEY 2WAY SLVR  5CC 14FR (CATHETERS) ×2
CATH FOLEY 2WAY SLVR 5CC 14FR (CATHETERS) ×2 IMPLANT
CATH HEART VENT LEFT (CATHETERS) IMPLANT
CATH HYDRAGLIDE XL THORACIC (CATHETERS) ×4 IMPLANT
CATH ROBINSON RED A/P 18FR (CATHETERS) ×8 IMPLANT
CATH THORACIC 28FR (CATHETERS) IMPLANT
CATH THORACIC 28FR RT ANG (CATHETERS) ×4 IMPLANT
CATH THORACIC 36FR RT ANG (CATHETERS) IMPLANT
CHLORAPREP W/TINT 26ML (MISCELLANEOUS) IMPLANT
CONN ST 1/4X3/8  BEN (MISCELLANEOUS)
CONN ST 1/4X3/8 BEN (MISCELLANEOUS) IMPLANT
CONT SPEC 4OZ CLIKSEAL STRL BL (MISCELLANEOUS) ×4 IMPLANT
COVER WAND RF STERILE (DRAPES) ×4 IMPLANT
CRADLE DONUT ADULT HEAD (MISCELLANEOUS) ×4 IMPLANT
DECANTER SPIKE VIAL GLASS SM (MISCELLANEOUS) ×4 IMPLANT
DRAIN CHANNEL 28F RND 3/8 FF (WOUND CARE) IMPLANT
DRAIN CHANNEL 32F RND 10.7 FF (WOUND CARE) ×4 IMPLANT
DRAPE BILATERAL SPLIT (DRAPES) ×4 IMPLANT
DRAPE CV SPLIT W-CLR ANES SCRN (DRAPES) ×4 IMPLANT
DRAPE INCISE IOBAN 66X45 STRL (DRAPES) IMPLANT
DRAPE SLUSH/WARMER DISC (DRAPES) ×4 IMPLANT
DRSG AQUACEL AG ADV 3.5X14 (GAUZE/BANDAGES/DRESSINGS) ×4 IMPLANT
ELECT BLADE 4.0 EZ CLEAN MEGAD (MISCELLANEOUS)
ELECT BLADE 6.5 EXT (BLADE) ×8 IMPLANT
ELECT CAUTERY BLADE 6.4 (BLADE) ×4 IMPLANT
ELECT REM PT RETURN 9FT ADLT (ELECTROSURGICAL) ×4
ELECTRODE BLDE 4.0 EZ CLN MEGD (MISCELLANEOUS) IMPLANT
ELECTRODE REM PT RTRN 9FT ADLT (ELECTROSURGICAL) ×2 IMPLANT
FELT TEFLON 6X6 (MISCELLANEOUS) ×4 IMPLANT
GAUZE SPONGE 4X4 12PLY STRL (GAUZE/BANDAGES/DRESSINGS) ×8 IMPLANT
GAUZE SPONGE 4X4 12PLY STRL LF (GAUZE/BANDAGES/DRESSINGS) ×4 IMPLANT
GLOVE BIO SURGEON STRL SZ 6.5 (GLOVE) ×15 IMPLANT
GLOVE BIO SURGEON STRL SZ7 (GLOVE) ×16 IMPLANT
GLOVE BIO SURGEON STRL SZ7.5 (GLOVE) ×24 IMPLANT
GLOVE BIO SURGEONS STRL SZ 6.5 (GLOVE) ×5
GLOVE BIOGEL PI IND STRL 6 (GLOVE) ×14 IMPLANT
GLOVE BIOGEL PI IND STRL 6.5 (GLOVE) ×10 IMPLANT
GLOVE BIOGEL PI IND STRL 7.0 (GLOVE) ×8 IMPLANT
GLOVE BIOGEL PI IND STRL 7.5 (GLOVE) ×2 IMPLANT
GLOVE BIOGEL PI INDICATOR 6 (GLOVE) ×14
GLOVE BIOGEL PI INDICATOR 6.5 (GLOVE) ×10
GLOVE BIOGEL PI INDICATOR 7.0 (GLOVE) ×8
GLOVE BIOGEL PI INDICATOR 7.5 (GLOVE) ×2
GOWN STRL REUS W/ TWL LRG LVL3 (GOWN DISPOSABLE) ×16 IMPLANT
GOWN STRL REUS W/ TWL XL LVL3 (GOWN DISPOSABLE) ×4 IMPLANT
GOWN STRL REUS W/TWL LRG LVL3 (GOWN DISPOSABLE) ×16
GOWN STRL REUS W/TWL XL LVL3 (GOWN DISPOSABLE) ×4
HEMOSTAT POWDER SURGIFOAM 1G (HEMOSTASIS) IMPLANT
HEMOSTAT SURGICEL 2X14 (HEMOSTASIS) IMPLANT
INSERT FOGARTY XLG (MISCELLANEOUS) IMPLANT
IV NS 1000ML (IV SOLUTION) ×10
IV NS 1000ML BAXH (IV SOLUTION) ×10 IMPLANT
KIT BASIN OR (CUSTOM PROCEDURE TRAY) ×4 IMPLANT
KIT LVAD HEARTMATE 3 W-CNTRL (Prosthesis & Implant Heart) ×3 IMPLANT
KIT LVAD HEARTMATE III W-CNTRL (Prosthesis & Implant Heart) ×1 IMPLANT
KIT SUCTION CATH 14FR (SUCTIONS) ×4 IMPLANT
KIT TURNOVER KIT B (KITS) ×4 IMPLANT
LEAD PACING MYOCARDI (MISCELLANEOUS) IMPLANT
LINE VENT (MISCELLANEOUS) ×4 IMPLANT
NDL SUT 1 .5 CRC FRENCH EYE (NEEDLE) ×2 IMPLANT
NEEDLE FRENCH EYE (NEEDLE) ×2
NS IRRIG 1000ML POUR BTL (IV SOLUTION) ×20 IMPLANT
PACK OPEN HEART (CUSTOM PROCEDURE TRAY) ×4 IMPLANT
PAD ARMBOARD 7.5X6 YLW CONV (MISCELLANEOUS) ×8 IMPLANT
PAD DEFIB R2 (MISCELLANEOUS) ×4 IMPLANT
PENCIL BUTTON HOLSTER BLD 10FT (ELECTRODE) ×4 IMPLANT
POWDER SURGICEL 3.0 GRAM (HEMOSTASIS) ×8 IMPLANT
PUNCH AORTIC ROTATE 4.5MM 8IN (MISCELLANEOUS) ×4 IMPLANT
SEALANT SURG COSEAL 8ML (VASCULAR PRODUCTS) ×4 IMPLANT
SET CARDIOPLEGIA MPS 5001102 (MISCELLANEOUS) ×4 IMPLANT
SHEATH AVANTI 11CM 5FR (SHEATH) IMPLANT
SPOGE SURGIFLO 8M (HEMOSTASIS) ×2
SPONGE LAP 18X18 RF (DISPOSABLE) ×4 IMPLANT
SPONGE SURGIFLO 8M (HEMOSTASIS) ×2 IMPLANT
SUCKER INTRACARDIAC WEIGHTED (SUCKER) ×4 IMPLANT
SURGIFLO W/THROMBIN 8M KIT (HEMOSTASIS) ×4 IMPLANT
SUT ETHIBOND 2 0 SH (SUTURE) ×10
SUT ETHIBOND 2 0 SH 36X2 (SUTURE) ×10 IMPLANT
SUT ETHIBOND NAB MH 2-0 36IN (SUTURE) ×56 IMPLANT
SUT PROLENE 3 0 RB 1 (SUTURE) IMPLANT
SUT PROLENE 3 0 SH DA (SUTURE) ×4 IMPLANT
SUT PROLENE 4 0 RB 1 (SUTURE) ×10
SUT PROLENE 4-0 RB1 .5 CRCL 36 (SUTURE) ×10 IMPLANT
SUT PROLENE 5 0 C 1 36 (SUTURE) ×8 IMPLANT
SUT PROLENE 5 0 C1 (SUTURE) IMPLANT
SUT PROLENE 6 0 C 1 30 (SUTURE) ×8 IMPLANT
SUT SILK  1 MH (SUTURE) ×8
SUT SILK 1 MH (SUTURE) ×8 IMPLANT
SUT SILK 1 TIES 10X30 (SUTURE) ×4 IMPLANT
SUT SILK 2 0 SH CR/8 (SUTURE) ×12 IMPLANT
SUT STEEL 6MS V (SUTURE) ×4 IMPLANT
SUT STEEL SZ 6 DBL 3X14 BALL (SUTURE) ×4 IMPLANT
SUT TEM PAC WIRE 2 0 SH (SUTURE) ×8 IMPLANT
SUT VIC AB 1 CTX 18 (SUTURE) ×4 IMPLANT
SUT VIC AB 1 CTX 36 (SUTURE) ×8
SUT VIC AB 1 CTX36XBRD ANBCTR (SUTURE) ×8 IMPLANT
SUT VIC AB 2-0 CTX 27 (SUTURE) ×8 IMPLANT
SUT VIC AB 3-0 SH 8-18 (SUTURE) ×4 IMPLANT
SUT VIC AB 3-0 X1 27 (SUTURE) ×16 IMPLANT
SYR 50ML LL SCALE MARK (SYRINGE) ×4 IMPLANT
SYSTEM SAHARA CHEST DRAIN ATS (WOUND CARE) ×4 IMPLANT
TAPE CLOTH SURG 4X10 WHT LF (GAUZE/BANDAGES/DRESSINGS) ×4 IMPLANT
TAPE PAPER 2X10 WHT MICROPORE (GAUZE/BANDAGES/DRESSINGS) ×4 IMPLANT
TOWEL GREEN STERILE (TOWEL DISPOSABLE) ×12 IMPLANT
TRAY CATH LUMEN 1 20CM STRL (SET/KITS/TRAYS/PACK) ×4 IMPLANT
TRAY FOLEY SLVR 16FR TEMP STAT (SET/KITS/TRAYS/PACK) ×4 IMPLANT
TUBE CONNECTING 12'X1/4 (SUCTIONS) ×1
TUBE CONNECTING 12X1/4 (SUCTIONS) ×3 IMPLANT
UNDERPAD 30X30 (UNDERPADS AND DIAPERS) ×4 IMPLANT
VENT LEFT HEART 12002 (CATHETERS)
WATER STERILE IRR 1000ML POUR (IV SOLUTION) ×8 IMPLANT
YANKAUER SUCT BULB TIP NO VENT (SUCTIONS) ×4 IMPLANT

## 2018-01-27 NOTE — Plan of Care (Signed)
  Problem: Health Behavior/Discharge Planning: Goal: Ability to manage health-related needs will improve Outcome: Progressing   Problem: Clinical Measurements: Goal: Ability to maintain clinical measurements within normal limits will improve Outcome: Progressing Goal: Will remain free from infection Outcome: Progressing Goal: Diagnostic test results will improve Outcome: Progressing Goal: Respiratory complications will improve Outcome: Progressing Goal: Cardiovascular complication will be avoided Outcome: Progressing   Problem: Health Behavior/Discharge Planning: Goal: Ability to manage health-related needs will improve Outcome: Progressing   Problem: Clinical Measurements: Goal: Ability to maintain clinical measurements within normal limits will improve Outcome: Progressing Goal: Will remain free from infection Outcome: Progressing Goal: Diagnostic test results will improve Outcome: Progressing Goal: Respiratory complications will improve Outcome: Progressing Goal: Cardiovascular complication will be avoided Outcome: Progressing   Problem: Elimination: Goal: Will not experience complications related to bowel motility Outcome: Progressing Goal: Will not experience complications related to urinary retention Outcome: Progressing   Problem: Skin Integrity: Goal: Risk for impaired skin integrity will decrease Outcome: Progressing   Problem: Education: Goal: Required Educational Video(s) Outcome: Progressing   Problem: Clinical Measurements: Goal: Postoperative complications will be avoided or minimized Outcome: Progressing   Problem: Skin Integrity: Goal: Demonstration of wound healing without infection will improve Outcome: Progressing   Problem: Education: Goal: Knowledge of the prescribed therapeutic regimen will improve Outcome: Progressing   Problem: Activity: Goal: Risk for activity intolerance will decrease Outcome: Progressing   Problem:  Cardiac: Goal: Ability to maintain an adequate cardiac output will improve Outcome: Progressing   Problem: Coping: Goal: Level of anxiety will decrease Outcome: Progressing   Problem: Fluid Volume: Goal: Risk for excess fluid volume will decrease Outcome: Progressing   Problem: Clinical Measurements: Goal: Ability to maintain clinical measurements within normal limits will improve Outcome: Progressing Goal: Will remain free from infection Outcome: Progressing   Problem: Respiratory: Goal: Will regain and/or maintain adequate ventilation Outcome: Progressing   

## 2018-01-27 NOTE — Transfer of Care (Signed)
Immediate Anesthesia Transfer of Care Note  Patient: Carl Ferguson  Procedure(s) Performed: INSERTION OF IMPLANTABLE LEFT VENTRICULAR ASSIST DEVICE HM3 (N/A Chest) TRANSESOPHAGEAL ECHOCARDIOGRAM (TEE) (N/A )  Patient Location:SICU/2H  Anesthesia Type:General  Level of Consciousness: sedated  Airway & Oxygen Therapy: Patient remains intubated per anesthesia plan and Patient placed on Ventilator (see vital sign flow sheet for setting)  Post-op Assessment: Report given to RN and Post -op Vital signs reviewed and stable  Post vital signs: Reviewed and stable  Last Vitals:  Vitals Value Taken Time  BP 87/78 01/27/2018  1:33 PM  Temp 37.3 C 01/27/2018  1:35 PM  Pulse 102 01/27/2018  1:33 PM  Resp 0 01/27/2018  1:35 PM  SpO2 100 % 01/27/2018  1:33 PM  Vitals shown include unvalidated device data.  Last Pain:  Vitals:   01/27/18 0531  TempSrc: Oral  PainSc:       Patients Stated Pain Goal: 0 (01/24/18 2000)  Complications: No apparent anesthesia complications

## 2018-01-27 NOTE — Progress Notes (Signed)
CT surgery p.m. Rounds  Patient stable after implantation of HeartMate 3 LVAD for nonischemic cardiomyopathy, cardiogenic shock Neuro intact VAD flow 4.2 L/min Speed reduced to 5600 after large amount of urine output and evidence of PI events Minimal chest tube drainage A-V paced Lungs clear Doing well  Plan- titrate norepinephrine to maintain MAP 70- 85 and wean inhaled nitric oxide to 4 ppm in anticipation of extubation tomorrow

## 2018-01-27 NOTE — Anesthesia Procedure Notes (Addendum)
Procedure Name: Intubation Date/Time: 01/27/2018 8:05 AM Performed by: Mayer Camel, CRNA Pre-anesthesia Checklist: Patient identified, Emergency Drugs available, Suction available, Patient being monitored and Timeout performed Patient Re-evaluated:Patient Re-evaluated prior to induction Oxygen Delivery Method: Circle system utilized Preoxygenation: Pre-oxygenation with 100% oxygen Induction Type: IV induction Ventilation: Mask ventilation without difficulty and Oral airway inserted - appropriate to patient size Laryngoscope Size: Hyacinth Meeker and 2 Grade View: Grade I Tube type: Subglottic suction tube Tube size: 8.0 mm Number of attempts: 1 Airway Equipment and Method: Stylet Placement Confirmation: ETT inserted through vocal cords under direct vision,  breath sounds checked- equal and bilateral and positive ETCO2 Secured at: 23 cm Tube secured with: Tape Dental Injury: Teeth and Oropharynx as per pre-operative assessment

## 2018-01-27 NOTE — Progress Notes (Signed)
CSW met with family in the waiting area. Family updated throughout the morning by the VAD Coordinator from the OR. Family state they just met with MD and thrilled to hear the surgery is over and was successful. Multiple family present including patient's mother who came from [place] and his daughter Karl Pock from new [place] to support patient through surgery. CSW provided supportive intervention and will continue to follow throughout implant hospitalization. Raquel Sarna, [place], [place]

## 2018-01-27 NOTE — Progress Notes (Signed)
Pre Procedure note for inpatients:   Carl Ferguson has been scheduled for Procedure(s): INSERTION OF IMPLANTABLE LEFT VENTRICULAR ASSIST DEVICE HM3 (N/A) TRANSESOPHAGEAL ECHOCARDIOGRAM (TEE) (N/A) today. The various methods of treatment have been discussed with the patient. After consideration of the risks, benefits and treatment options the patient has consented to the planned procedure.   The patient has been seen and labs reviewed. There are no changes in the patient's condition to prevent proceeding with the planned procedure today.  Recent labs:  Lab Results  Component Value Date   WBC 9.0 01/27/2018   HGB 13.1 01/27/2018   HCT 36.6 (L) 01/27/2018   PLT 261 01/27/2018   GLUCOSE 99 01/27/2018   CHOL 149 01/03/2018   TRIG 43 01/03/2018   HDL 42 01/03/2018   LDLCALC 98 01/03/2018   ALT 13 01/18/2018   AST 22 01/18/2018   NA 136 01/27/2018   K 4.0 01/27/2018   CL 96 (L) 01/27/2018   CREATININE 1.10 01/27/2018   BUN 18 01/27/2018   CO2 32 01/27/2018   TSH 1.436 12/30/2017   INR 1.15 12/26/2017   HGBA1C 5.4 01/03/2018    Mikey Bussing, MD 01/27/2018 7:24 AM

## 2018-01-27 NOTE — Brief Op Note (Signed)
01/18/2018 - 01/27/2018  11:43 AM  PATIENT:  Carl Ferguson  52 y.o. male  PRE-OPERATIVE DIAGNOSIS:  Heart Failure  POST-OPERATIVE DIAGNOSIS:  Heart Failure  PROCEDURE:  Procedure(s):  INSERTION OF IMPLANTABLE LEFT VENTRICULAR ASSIST DEVICE HM3 (N/A) TRANSESOPHAGEAL ECHOCARDIOGRAM (TEE) (N/A)  SURGEON:  Surgeon(s) and Role:    Kerin Perna, MD - Primary  PHYSICIAN ASSISTANT: Lowella Dandy PA-C  ANESTHESIA:   general  EBL: 300 cc  BLOOD ADMINISTERED:CELLSAVER and 2 FFP  DRAINS: Mediastinal Chest Drains, Drain in abdominal pocket   LOCAL MEDICATIONS USED:  NONE  SPECIMEN:  Source of Specimen:  Core of Heart  DISPOSITION OF SPECIMEN:  PATHOLOGY  COUNTS:  YES  TOURNIQUET:  * No tourniquets in log *  DICTATION: .Dragon Dictation  PLAN OF CARE: Admit to inpatient   PATIENT DISPOSITION:  ICU - intubated and hemodynamically stable.   Delay start of Pharmacological VTE agent (>24hrs) due to surgical blood loss or risk of bleeding: yes

## 2018-01-27 NOTE — Anesthesia Procedure Notes (Signed)
Arterial Line Insertion Start/End10/14/2019 7:10 AM Performed by: Marcene Duos, MD, Mayer Camel, CRNA, CRNA  Patient location: Pre-op. Preanesthetic checklist: patient identified, IV checked, site marked, risks and benefits discussed, surgical consent, monitors and equipment checked, pre-op evaluation, timeout performed and anesthesia consent Lidocaine 1% used for infiltration Left, radial was placed Catheter size: 20 G Hand hygiene performed  and maximum sterile barriers used   Attempts: 1 Procedure performed without using ultrasound guided technique. Ultrasound Notes:anatomy identified, needle tip was noted to be adjacent to the nerve/plexus identified and no ultrasound evidence of intravascular and/or intraneural injection Following insertion, dressing applied and Biopatch. Post procedure assessment: decreased circulation and normal  Patient tolerated the procedure well with no immediate complications.

## 2018-01-27 NOTE — Progress Notes (Signed)
Patient left for short stay  In bed accompanied by transporter , nurse tech and nurse.

## 2018-01-27 NOTE — Progress Notes (Signed)
Dr. Donata Clay updated on patient's hypotension and suction events - pump speed dropped from 5700 to 5400 and frequent PI events. Verbal order received to administer albumin, start levophed to maintain MAP of 70, decrease Milrinone to 0.3, and decrease pump speed from 5700 to 5600. Orders implemented and will continue to monitor patient closely.

## 2018-01-27 NOTE — Progress Notes (Signed)
  Echocardiogram Echocardiogram Transesophageal has been performed.  Carl Ferguson Carl Ferguson 01/27/2018, 8:49 AM

## 2018-01-27 NOTE — Progress Notes (Signed)
Pt transported to 2H via vent and Nitric machine w/ no apparent complications.  Unit RT in room to receive pt.

## 2018-01-27 NOTE — Progress Notes (Signed)
Pharmacy Antibiotic Note  Carl Ferguson is a 52 y.o. male admitted on 01/18/2018 with cardiogenic shock requiring LVAD implant on 10/14.  Pharmacy has been consulted for vancomycin dosing for surgical prophylaxis..  Plan: Vancomycin 750mg  IV q12h x3 doses Pharmacy will sign off, reconsult as needed   Height: 5\' 7"  (170.2 cm) Weight: 137 lb 9.1 oz (62.4 kg) IBW/kg (Calculated) : 66.1  Temp (24hrs), Avg:98.2 F (36.8 C), Min:97.8 F (36.6 C), Max:98.9 F (37.2 C)  Recent Labs  Lab 01/23/18 0558 01/24/18 0457 01/25/18 0500 01/26/18 0559 01/27/18 0350  01/27/18 0942 01/27/18 1014 01/27/18 1041 01/27/18 1111 01/27/18 1142  WBC 9.4 10.6* 9.0 8.7 9.0  --   --   --   --   --   --   CREATININE 0.92 1.00 1.05 0.99 1.10   < > 0.90 0.90 0.90 1.00 1.00   < > = values in this interval not displayed.    Estimated Creatinine Clearance: 77.1 mL/min (by C-G formula based on SCr of 1 mg/dL).    Allergies  Allergen Reactions  . Norflex [Orphenadrine] Swelling    FACE [ANGIOEDEMA]     Thank you for allowing pharmacy to be a part of this patient's care.  Fredonia Highland, PharmD, BCPS Clinical Pharmacist [phone] Please check AMION for all [facility] numbers 01/27/2018

## 2018-01-27 NOTE — Anesthesia Postprocedure Evaluation (Signed)
Anesthesia Post Note  Patient: Carl Ferguson  Procedure(s) Performed: INSERTION OF IMPLANTABLE LEFT VENTRICULAR ASSIST DEVICE HM3 (N/A Chest) TRANSESOPHAGEAL ECHOCARDIOGRAM (TEE) (N/A )     Patient location during evaluation: SICU Anesthesia Type: General Level of consciousness: sedated Pain management: pain level controlled Vital Signs Assessment: post-procedure vital signs reviewed and stable Respiratory status: patient remains intubated per anesthesia plan Cardiovascular status: stable Postop Assessment: no apparent nausea or vomiting Anesthetic complications: no    Last Vitals:  Vitals:   01/27/18 1445 01/27/18 1500  BP: 96/78 93/80  Pulse:  (!) 31  Resp: 16 20  Temp: 37.6 C 37.7 C  SpO2: 100% 100%    Last Pain:  Vitals:   01/27/18 1400  TempSrc: Core (Comment)  PainSc:                  Kennieth Rad

## 2018-01-27 NOTE — Anesthesia Procedure Notes (Signed)
Central Venous Catheter Insertion Performed by: Suzette Battiest, MD, anesthesiologist Start/End10/14/2019 7:15 AM, 01/27/2018 7:30 AM Patient location: Pre-op. Preanesthetic checklist: patient identified, IV checked, site marked, risks and benefits discussed, surgical consent, monitors and equipment checked, pre-op evaluation, timeout performed and anesthesia consent Position: Trendelenburg Lidocaine 1% used for infiltration and patient sedated Hand hygiene performed , maximum sterile barriers used  and Seldinger technique used Catheter size: 8.5 Fr Total catheter length 10. Central line and PA cath was placed.MAC introducer Swan type:thermodilution PA Cath depth:45 Procedure performed using ultrasound guided technique. Ultrasound Notes:anatomy identified, needle tip was noted to be adjacent to the nerve/plexus identified, no ultrasound evidence of intravascular and/or intraneural injection and image(s) printed for medical record Attempts: 1 Following insertion, line sutured, dressing applied and Biopatch. Post procedure assessment: blood return through all ports, free fluid flow and no air  Patient tolerated the procedure well with no immediate complications.

## 2018-01-27 NOTE — Anesthesia Procedure Notes (Signed)
Central Venous Catheter Insertion Performed by: Marcene Duos, MD, anesthesiologist Start/End10/14/2019 7:15 AM, 01/27/2018 7:30 AM Patient location: Pre-op. Preanesthetic checklist: patient identified, IV checked, site marked, risks and benefits discussed, surgical consent, monitors and equipment checked, pre-op evaluation, timeout performed and anesthesia consent Hand hygiene performed  and maximum sterile barriers used  PA cath was placed.Swan type:thermodilution PA Cath depth:50 Procedure performed without using ultrasound guided technique. Attempts: 1 Patient tolerated the procedure well with no immediate complications.

## 2018-01-28 ENCOUNTER — Inpatient Hospital Stay: Payer: Self-pay

## 2018-01-28 ENCOUNTER — Encounter (HOSPITAL_COMMUNITY): Payer: Self-pay | Admitting: Cardiothoracic Surgery

## 2018-01-28 ENCOUNTER — Inpatient Hospital Stay (HOSPITAL_COMMUNITY): Payer: Medicaid Other

## 2018-01-28 DIAGNOSIS — R5082 Postprocedural fever: Secondary | ICD-10-CM

## 2018-01-28 DIAGNOSIS — Z960 Presence of urogenital implants: Secondary | ICD-10-CM

## 2018-01-28 DIAGNOSIS — R079 Chest pain, unspecified: Secondary | ICD-10-CM

## 2018-01-28 DIAGNOSIS — Z95811 Presence of heart assist device: Secondary | ICD-10-CM

## 2018-01-28 DIAGNOSIS — Z978 Presence of other specified devices: Secondary | ICD-10-CM

## 2018-01-28 LAB — POCT I-STAT 3, ART BLOOD GAS (G3+)
ACID-BASE DEFICIT: 2 mmol/L (ref 0.0–2.0)
ACID-BASE EXCESS: 1 mmol/L (ref 0.0–2.0)
Acid-base deficit: 1 mmol/L (ref 0.0–2.0)
Acid-base deficit: 2 mmol/L (ref 0.0–2.0)
BICARBONATE: 25.4 mmol/L (ref 20.0–28.0)
BICARBONATE: 21.8 mmol/L (ref 20.0–28.0)
Bicarbonate: 21.9 mmol/L (ref 20.0–28.0)
Bicarbonate: 21.3 mmol/L (ref 20.0–28.0)
O2 Saturation: 95 %
O2 Saturation: 100 %
O2 Saturation: 98 %
O2 Saturation: 97 %
PCO2 ART: 30 mmHg — AB (ref 32.0–48.0)
PH ART: 7.472 — AB (ref 7.350–7.450)
PH ART: 7.448 (ref 7.350–7.450)
PO2 ART: 209 mmHg — AB (ref 83.0–108.0)
PO2 ART: 95 mmHg (ref 83.0–108.0)
Patient temperature: 37.6
TCO2: 27 mmol/L (ref 22–32)
TCO2: 23 mmol/L (ref 22–32)
TCO2: 22 mmol/L (ref 22–32)
TCO2: 23 mmol/L (ref 22–32)
pCO2 arterial: 41.3 mmHg (ref 32.0–48.0)
pCO2 arterial: 30.9 mmHg — ABNORMAL LOW (ref 32.0–48.0)
pCO2 arterial: 31 mmHg — ABNORMAL LOW (ref 32.0–48.0)
pH, Arterial: 7.401 (ref 7.350–7.450)
pH, Arterial: 7.453 — ABNORMAL HIGH (ref 7.350–7.450)
pO2, Arterial: 83 mmHg (ref 83.0–108.0)
pO2, Arterial: 83 mmHg (ref 83.0–108.0)

## 2018-01-28 LAB — POCT I-STAT, CHEM 8
BUN: 14 mg/dL (ref 6–20)
BUN: 16 mg/dL (ref 6–20)
BUN: 16 mg/dL (ref 6–20)
CALCIUM ION: 1.16 mmol/L (ref 1.15–1.40)
CHLORIDE: 98 mmol/L (ref 98–111)
CREATININE: 1 mg/dL (ref 0.61–1.24)
Calcium, Ion: 1.22 mmol/L (ref 1.15–1.40)
Calcium, Ion: 1.15 mmol/L (ref 1.15–1.40)
Chloride: 98 mmol/L (ref 98–111)
Chloride: 96 mmol/L — ABNORMAL LOW (ref 98–111)
Creatinine, Ser: 0.9 mg/dL (ref 0.61–1.24)
Creatinine, Ser: 1 mg/dL (ref 0.61–1.24)
GLUCOSE: 135 mg/dL — AB (ref 70–99)
GLUCOSE: 208 mg/dL — AB (ref 70–99)
Glucose, Bld: 123 mg/dL — ABNORMAL HIGH (ref 70–99)
HCT: 25 % — ABNORMAL LOW (ref 39.0–52.0)
HCT: 26 % — ABNORMAL LOW (ref 39.0–52.0)
HEMATOCRIT: 30 % — AB (ref 39.0–52.0)
HEMOGLOBIN: 8.5 g/dL — AB (ref 13.0–17.0)
HEMOGLOBIN: 10.2 g/dL — AB (ref 13.0–17.0)
Hemoglobin: 8.8 g/dL — ABNORMAL LOW (ref 13.0–17.0)
POTASSIUM: 3.9 mmol/L (ref 3.5–5.1)
POTASSIUM: 4.5 mmol/L (ref 3.5–5.1)
Potassium: 4.4 mmol/L (ref 3.5–5.1)
SODIUM: 135 mmol/L (ref 135–145)
Sodium: 135 mmol/L (ref 135–145)
Sodium: 131 mmol/L — ABNORMAL LOW (ref 135–145)
TCO2: 22 mmol/L (ref 22–32)
TCO2: 26 mmol/L (ref 22–32)
TCO2: 28 mmol/L (ref 22–32)

## 2018-01-28 LAB — PREPARE FRESH FROZEN PLASMA: Unit division: 0

## 2018-01-28 LAB — CBC
HCT: 30.4 % — ABNORMAL LOW (ref 39.0–52.0)
Hemoglobin: 10.6 g/dL — ABNORMAL LOW (ref 13.0–17.0)
MCH: 31.7 pg (ref 26.0–34.0)
MCHC: 34.9 g/dL (ref 30.0–36.0)
MCV: 91 fL (ref 80.0–100.0)
Platelets: 144 10*3/uL — ABNORMAL LOW (ref 150–400)
RBC: 3.34 MIL/uL — ABNORMAL LOW (ref 4.22–5.81)
RDW: 13.9 % (ref 11.5–15.5)
WBC: 15.6 10*3/uL — ABNORMAL HIGH (ref 4.0–10.5)
nRBC: 0 % (ref 0.0–0.2)

## 2018-01-28 LAB — CBC WITH DIFFERENTIAL/PLATELET
Abs Immature Granulocytes: 0.03 10*3/uL (ref 0.00–0.07)
Basophils Absolute: 0 10*3/uL (ref 0.0–0.1)
Basophils Relative: 0 %
Eosinophils Absolute: 0.1 10*3/uL (ref 0.0–0.5)
Eosinophils Relative: 1 %
HCT: 28.3 % — ABNORMAL LOW (ref 39.0–52.0)
Hemoglobin: 9.6 g/dL — ABNORMAL LOW (ref 13.0–17.0)
Immature Granulocytes: 0 %
Lymphocytes Relative: 9 %
Lymphs Abs: 0.8 10*3/uL (ref 0.7–4.0)
MCH: 31.4 pg (ref 26.0–34.0)
MCHC: 33.9 g/dL (ref 30.0–36.0)
MCV: 92.5 fL (ref 80.0–100.0)
Monocytes Absolute: 0.8 10*3/uL (ref 0.1–1.0)
Monocytes Relative: 9 %
Neutro Abs: 6.9 10*3/uL (ref 1.7–7.7)
Neutrophils Relative %: 81 %
Platelets: 124 10*3/uL — ABNORMAL LOW (ref 150–400)
RBC: 3.06 MIL/uL — ABNORMAL LOW (ref 4.22–5.81)
RDW: 13.4 % (ref 11.5–15.5)
WBC: 8.6 10*3/uL (ref 4.0–10.5)
nRBC: 0 % (ref 0.0–0.2)

## 2018-01-28 LAB — COMPREHENSIVE METABOLIC PANEL
ALT: 16 U/L (ref 0–44)
AST: 84 U/L — ABNORMAL HIGH (ref 15–41)
Albumin: 3.6 g/dL (ref 3.5–5.0)
Alkaline Phosphatase: 52 U/L (ref 38–126)
Anion gap: 10 (ref 5–15)
BUN: 14 mg/dL (ref 6–20)
CO2: 25 mmol/L (ref 22–32)
Calcium: 9 mg/dL (ref 8.9–10.3)
Chloride: 100 mmol/L (ref 98–111)
Creatinine, Ser: 0.98 mg/dL (ref 0.61–1.24)
GFR calc Af Amer: >60 mL/min (ref >60)
GFR calc non Af Amer: >60 mL/min (ref >60)
Glucose, Bld: 133 mg/dL — ABNORMAL HIGH (ref 70–99)
Potassium: 4.4 mmol/L (ref 3.5–5.1)
Sodium: 135 mmol/L (ref 135–145)
Total Bilirubin: 2.6 mg/dL — ABNORMAL HIGH (ref 0.3–1.2)
Total Protein: 5.9 g/dL — ABNORMAL LOW (ref 6.5–8.1)

## 2018-01-28 LAB — BPAM FFP
BLOOD PRODUCT EXPIRATION DATE: 201910192359
BLOOD PRODUCT EXPIRATION DATE: 201910192359
BLOOD PRODUCT EXPIRATION DATE: 201910192359
Blood Product Expiration Date: 201910192359
ISSUE DATE / TIME: 201910140824
ISSUE DATE / TIME: 201910140824
ISSUE DATE / TIME: 201910140824
ISSUE DATE / TIME: 201910140824
UNIT TYPE AND RH: 7300
UNIT TYPE AND RH: 7300
Unit Type and Rh: 7300
Unit Type and Rh: 7300

## 2018-01-28 LAB — BASIC METABOLIC PANEL
Anion gap: 10 (ref 5–15)
BUN: 14 mg/dL (ref 6–20)
CO2: 22 mmol/L (ref 22–32)
Calcium: 9 mg/dL (ref 8.9–10.3)
Chloride: 99 mmol/L (ref 98–111)
Creatinine, Ser: 1.12 mg/dL (ref 0.61–1.24)
GFR calc Af Amer: >60 mL/min (ref >60)
GFR calc non Af Amer: >60 mL/min (ref >60)
Glucose, Bld: 202 mg/dL — ABNORMAL HIGH (ref 70–99)
Potassium: 3.8 mmol/L (ref 3.5–5.1)
Sodium: 131 mmol/L — ABNORMAL LOW (ref 135–145)

## 2018-01-28 LAB — MAGNESIUM
Magnesium: 2.7 mg/dL — ABNORMAL HIGH (ref 1.7–2.4)
Magnesium: 2.2 mg/dL (ref 1.7–2.4)

## 2018-01-28 LAB — COOXEMETRY PANEL
Carboxyhemoglobin: 1.8 % — ABNORMAL HIGH (ref 0.5–1.5)
Methemoglobin: 1.7 % — ABNORMAL HIGH (ref 0.0–1.5)
O2 SAT: 70.4 %
TOTAL HEMOGLOBIN: 9.4 g/dL — AB (ref 12.0–16.0)

## 2018-01-28 LAB — HEPARIN LEVEL (UNFRACTIONATED): Heparin Unfractionated: <0.1 IU/mL — ABNORMAL LOW (ref 0.30–0.70)

## 2018-01-28 LAB — CALCIUM, IONIZED: Calcium, Ionized, Serum: 4.6 mg/dL (ref 4.5–5.6)

## 2018-01-28 LAB — GLUCOSE, CAPILLARY
GLUCOSE-CAPILLARY: 114 mg/dL — AB (ref 70–99)
GLUCOSE-CAPILLARY: 101 mg/dL — AB (ref 70–99)
GLUCOSE-CAPILLARY: 116 mg/dL — AB (ref 70–99)

## 2018-01-28 LAB — PROTIME-INR
INR: 1.33
Prothrombin Time: 16.3 seconds — ABNORMAL HIGH (ref 11.4–15.2)

## 2018-01-28 LAB — BRAIN NATRIURETIC PEPTIDE: B Natriuretic Peptide: 235 pg/mL — ABNORMAL HIGH (ref 0.0–100.0)

## 2018-01-28 LAB — PHOSPHORUS: Phosphorus: 3.6 mg/dL (ref 2.5–4.6)

## 2018-01-28 LAB — LACTATE DEHYDROGENASE: LDH: 282 U/L — ABNORMAL HIGH (ref 98–192)

## 2018-01-28 MED ORDER — WARFARIN SODIUM 2.5 MG PO TABS
2.5000 mg | ORAL_TABLET | Freq: Every day | ORAL | Status: DC
  Administered 2018-01-28: 2.5 mg via ORAL
  Filled 2018-01-28: qty 1

## 2018-01-28 MED ORDER — FUROSEMIDE 10 MG/ML IJ SOLN
40.0000 mg | Freq: Once | INTRAMUSCULAR | Status: AC
  Administered 2018-01-28: 40 mg via INTRAVENOUS
  Filled 2018-01-28: qty 4

## 2018-01-28 MED ORDER — SODIUM CHLORIDE 0.9 % IV SOLN
2.0000 g | Freq: Two times a day (BID) | INTRAVENOUS | Status: DC
  Filled 2018-01-28: qty 2

## 2018-01-28 MED ORDER — MILRINONE LACTATE IN DEXTROSE 20-5 MG/100ML-% IV SOLN
0.1250 ug/kg/min | INTRAVENOUS | Status: DC
  Administered 2018-01-28: 0.25 ug/kg/min via INTRAVENOUS
  Administered 2018-01-29: 0.125 ug/kg/min via INTRAVENOUS
  Filled 2018-01-28 (×2): qty 100

## 2018-01-28 MED ORDER — ALBUMIN HUMAN 5 % IV SOLN: 12.5000 g | INTRAVENOUS | Status: AC | PRN

## 2018-01-28 MED ORDER — VANCOMYCIN HCL IN DEXTROSE 750-5 MG/150ML-% IV SOLN: 750.0000 mg | Freq: Two times a day (BID) | INTRAVENOUS | Status: DC

## 2018-01-28 MED ORDER — WARFARIN - PHYSICIAN DOSING INPATIENT
Freq: Every day | Status: DC
  Administered 2018-01-28 – 2018-01-30 (×2)

## 2018-01-28 MED ORDER — ORAL CARE MOUTH RINSE
15.0000 mL | Freq: Four times a day (QID) | OROMUCOSAL | Status: DC
  Administered 2018-01-28: 15 mL via OROMUCOSAL

## 2018-01-28 MED ORDER — ACETAMINOPHEN 10 MG/ML IV SOLN
1000.0000 mg | Freq: Once | INTRAVENOUS | Status: AC
  Administered 2018-01-28: 1000 mg via INTRAVENOUS
  Filled 2018-01-28: qty 100

## 2018-01-28 MED ORDER — SODIUM CHLORIDE 0.9% IV SOLUTION
Freq: Once | INTRAVENOUS | Status: AC
  Administered 2018-01-28: 03:00:00 via INTRAVENOUS

## 2018-01-28 MED ORDER — VANCOMYCIN HCL IN DEXTROSE 750-5 MG/150ML-% IV SOLN
750.0000 mg | Freq: Two times a day (BID) | INTRAVENOUS | Status: DC
  Filled 2018-01-28: qty 150

## 2018-01-28 MED ORDER — AMIODARONE HCL 200 MG PO TABS
200.0000 mg | ORAL_TABLET | Freq: Two times a day (BID) | ORAL | Status: DC
  Administered 2018-01-29 (×2): 200 mg via ORAL
  Filled 2018-01-28 (×2): qty 1

## 2018-01-28 MED ORDER — MORPHINE SULFATE (PF) 2 MG/ML IV SOLN: 1.0000 mg | INTRAVENOUS | Status: AC | PRN

## 2018-01-28 MED ORDER — METHYLPREDNISOLONE SODIUM SUCC 125 MG IJ SOLR
80.0000 mg | Freq: Once | INTRAMUSCULAR | Status: AC
  Administered 2018-01-28: 80 mg via INTRAVENOUS
  Filled 2018-01-28: qty 2

## 2018-01-28 MED ORDER — FUROSEMIDE 10 MG/ML IJ SOLN
40.0000 mg | Freq: Two times a day (BID) | INTRAMUSCULAR | Status: DC
  Administered 2018-01-28: 40 mg via INTRAVENOUS
  Filled 2018-01-28: qty 4

## 2018-01-28 MED ORDER — HYDRALAZINE HCL 20 MG/ML IJ SOLN
10.0000 mg | INTRAMUSCULAR | Status: DC | PRN
  Administered 2018-01-28 – 2018-02-02 (×9): 10 mg via INTRAVENOUS
  Filled 2018-01-28 (×9): qty 1

## 2018-01-28 MED ORDER — VANCOMYCIN HCL IN DEXTROSE 750-5 MG/150ML-% IV SOLN
750.0000 mg | Freq: Two times a day (BID) | INTRAVENOUS | Status: DC
  Administered 2018-01-28 – 2018-01-29 (×2): 750 mg via INTRAVENOUS
  Filled 2018-01-28 (×2): qty 150

## 2018-01-28 MED ORDER — HYDRALAZINE HCL 10 MG PO TABS
10.0000 mg | ORAL_TABLET | Freq: Three times a day (TID) | ORAL | Status: DC
  Administered 2018-01-28 – 2018-01-29 (×4): 10 mg via ORAL
  Filled 2018-01-28 (×4): qty 1

## 2018-01-28 MED ORDER — SODIUM CHLORIDE 0.9 % IV SOLN
3.0000 g | Freq: Four times a day (QID) | INTRAVENOUS | Status: AC
  Administered 2018-01-28 – 2018-01-31 (×13): 3 g via INTRAVENOUS
  Filled 2018-01-28 (×13): qty 3

## 2018-01-28 MED FILL — Potassium Chloride Inj 2 mEq/ML: INTRAVENOUS | Qty: 40 | Status: AC

## 2018-01-28 MED FILL — Magnesium Sulfate Inj 50%: INTRAMUSCULAR | Qty: 10 | Status: AC

## 2018-01-28 MED FILL — Heparin Sodium (Porcine) Inj 1000 Unit/ML: INTRAMUSCULAR | Qty: 30 | Status: AC

## 2018-01-28 NOTE — Progress Notes (Addendum)
LVAD Coordinator Rounding Note:  Admitted 01/18/18 due to fever and chills. Started on broad spectrum antibiotics. Teeth extractions done by Dr. Kristin Bruins 01/20/18. RHC done 01/24/18.   HM III LVAD implanted on 01/27/18 by Dr. Donata Clay under Destination Therapy criteria.  Stable overnight. Received 40mg  IV Lasix this morning. CVP 7-8. Did spike temp up to 101.3 this morning. Blood, sputum, and urine cultures ordered. Will continue Vancomycin & Cefepime per pharmacy.   Vital signs: Temp: 101.3  HR: 92 AV-paced Doppler Pressure: not done Art line BP: 77/62 (73) Automatic BP: 106/51 (66) O2 Sat: 100 on 40% FiO2 Wt: 62.4>72.0kg     LVAD interrogation reveals:   Speed: 5600 Flow: 5.1 Power: 4.2 PI: 2.7  Alarms: none Events:  No PI events since speed decreased to 5600 yesterday afternoon (56 PI events 10/14)  Hematocrit: 28 Fixed speed: 5600 Low speed limit: 5300   Drive Line:  Existing VAD dressing removed and site care performed using sterile technique. Drive line exit site cleaned with Chlora prep applicators x 2, allowed to dry, and gauze with silver stip re-applied. Exit site with no tissue ingrowth, 2 sutures intact, and the velour is fully implanted at exit site. Small amount of bloody drainage noted at site, and on gauze dressing. No redness, tenderness, foul odor or rash noted. Drive line anchor re-applied. Dressing to be changed daily by VAD nurse, nurse champion, or trained caregiver.      Labs:  LDH trend: 282>   INR trend: 1.26>1.33   WBC trend: 11.4>8.6  Anticoagulation Plan: -INR Goal:   -ASA Dose: ASA 324mg  daily   Blood Products:  -Intraop: 4 FFP; 450 cell saver  - Post op: 1 PRBC  Device: - Medtronic ICD -Therapies: off currently  Drips: Milrinone 0.8mcg/kg/min  Amiodarone 30mg /hr  Arrythmias:   Respiratory: Currently intubated. Plan is to wean and extubate as patient tolerates.   Infection: Blood/urine/sputum cultures ordered due to fever  101.3.    Adverse Events on VAD: -  VAD Education:  1. Patient is currently intubated, and there are no family members/care givers at the bedside. Not appropriate to begin education at this time.    Plan/Recommendations:  1. Wean and extubate per MD order. 2. Daily dressing changes per VAD RN, nurse champion, or trained caregiver.  3. Call VAD pager with any equipment or drive line questions, or needs.   [name] RN VAD Coordinator  Office: [phone]  24/7 Pager: [phone]

## 2018-01-28 NOTE — Progress Notes (Addendum)
Nutrition Follow-up  DOCUMENTATION CODES:   Not applicable  INTERVENTION:    If TF started, rec initiation of Vital AF 1.2 at goal rate of 65 ml/h (1560 ml per day)   Provides 1872 kcals, 117 gm protein, 1265 ml free water daily  NUTRITION DIAGNOSIS:   Increased nutrient needs related to chronic illness(advanced heart failure) as evidenced by estimated needs, ongoing  GOAL:   Patient will meet greater than or equal to 90% of their needs, currently unmet  MONITOR:   Vent status, Labs, Skin, Weight trends, I & O's  ASSESSMENT:   52 year old man with nonischemic cardiomyopathy, dependent on milrinone, and awaiting VAD placement, who presents with fever.    Pt s/p procedure 10/14: INSERTION OF IMPLANTABLE LVAD (HEARTMATE III)  He is currently intubated on ventilator support  MV: 8.9 L/min Temp (24hrs), Avg:100 F (37.8 C), Min:99 F (37.2 C), Max:101.3 F (38.5 C)  OGT in place  Prior to surgical intervention pt was on a Heart Healthy diet. PO intake was excellent at 100% per flowsheet records. Was drinking his Ensure Enlive supplements. Labs & medications reviewed. CBG's 110-114-101.  Advanced Heart Failure note reviewed. Possible extubation today. Pt awake.  Diet Order:   Diet Order    None     EDUCATION NEEDS:   Not appropriate for education at this time  Skin:  Skin Assessment: Skin Integrity Issues: Skin Integrity Issues:: Incisions Incisions: chest  Last BM:  10/8   Intake/Output Summary (Last 24 hours) at 01/28/2018 1144 Last data filed at 01/28/2018 1100 Gross per 24 hour  Intake 7028.57 ml  Output 4670 ml  Net 2358.57 ml    Height:   Ht Readings from Last 1 Encounters:  01/18/18 5\' 7"  (1.702 m)   Weight:   Wt Readings from Last 1 Encounters:  01/28/18 72 kg   Ideal Body Weight:  67.3 kg  BMI:  Body mass index is 24.86 kg/m.  Estimated Nutritional Needs:   Kcal:  1873  Protein:  110-125 gm  Fluid:  per MD  Maureen Chatters, RD, LDN Pager #: [phone] After-Hours Pager #: [phone]

## 2018-01-28 NOTE — Progress Notes (Addendum)
Patient ID: Carl Ferguson, male   DOB: 26-Feb-1966, 52 y.o.   MRN: [id number]     Advanced Heart Failure Rounding Note  PCP-Cardiologist: Jenean Lindau, MD   Subjective:    Events 10/11 RHC - Stable on milrinone 0.25 10/14 HM3 implantation; 2 units FFP. CTs and pressors in place out of OR.  10/14 pm. Speed reduced to 5600 after large UOP and many PI events.   [place]ox 70.4% this am on Milrinone 0.3. On amio gtt.   CVP 9-10.  Post op Tmax 100.9. Received periop zinacef and diflucan. WBC 8.6.  Doing well. Awake on vent. Transiently required levophed overnight. No epi. Off levophed now.   Swan numbers: CVP 9-10 PA 27/17 CI 3.2  LVAD Interrogation HM 3: Speed: 5600 Flow: 5.0 PI: 2.9 Power: 4.0. 50 PI events yesterday afternoon, none since 1630 on 10/14.   Objective:   Weight Range: 72 kg Body mass index is 24.86 kg/m.   Vital Signs:   Temp:  [99.1 F (37.3 C)-100.9 F (38.3 C)] 99.3 F (37.4 C) (10/15 0700) Pulse Rate:  [25-119] 70 (10/15 0700) Resp:  [5-25] 21 (10/15 0700) BP: (76-111)/(58-92) 97/80 (10/15 0400) SpO2:  [98 %-100 %] 100 % (10/15 0700) Arterial Line BP: (73-116)/(55-87) 92/81 (10/15 0700) FiO2 (%):  [40 %-50 %] 40 % (10/15 0345) Weight:  [72 kg] 72 kg (10/15 0500) Last BM Date: 01/25/18  Weight change: Filed Weights   01/26/18 0500 01/27/18 0300 01/28/18 0500  Weight: 63.4 kg 62.4 kg 72 kg   Intake/Output:   Intake/Output Summary (Last 24 hours) at 01/28/2018 0726 Last data filed at 01/28/2018 0702 Gross per 24 hour  Intake 7691.34 ml  Output 4905 ml  Net 2786.34 ml    Physical Exam    General: Intubated. Awake.   HEENT: + ETT Neck: Supple. JVP ~8. Carotids 2+ bilat; no bruits. No thyromegaly or nodule noted. Cor: PMI nondisplaced. RRR, No M/G/R noted. Sternotomy dressing C/D/I. CTs in place.  Lungs: Diminished basilar sounds Abdomen: Soft, non-tender, non-distended, no HSM. No bruits or masses. +BS. Driveline dressing C/D/I.    Extremities: No cyanosis, clubbing, or rash. Trace ankle edema.  Neuro: Intubated. Awake. Follows commands.   Telemetry   Rate 80, A-pacing from epicardial lead and His pacing from his pacemaker.  Underlying afib in 36s.  Personally reviewed.   Labs    CBC Recent Labs    01/27/18 1337  01/27/18 1955  01/28/18 0542 01/28/18 0554  WBC 13.4*  --  11.4*  --  8.6  --   NEUTROABS 10.8*  --   --   --  6.9  --   HGB 12.0*  --  9.6*   < > 9.6* 8.5*  HCT 34.9*  --  27.5*   < > 28.3* 25.0*  MCV 90.4  --  91.4  --  92.5  --   PLT 177   < > 168  --  124*  --    < > = values in this interval not displayed.   Basic Metabolic Panel Recent Labs    01/27/18 1337 01/27/18 1955  01/28/18 0542 01/28/18 0554  NA 138  --    < > 135 135  K 3.2*  --    < > 4.4 4.4  CL 100  --    < > 100 98  CO2 29  --   --  25  --   GLUCOSE 60*  --    < > 133* 135*  BUN 16  --    < > 14 16  CREATININE 0.96 1.07   < > 0.98 1.00  CALCIUM 8.4*  --   --  9.0  --   MG 2.1 3.4*  --  2.7*  --   PHOS  --   --   --  3.6  --    < > = values in this interval not displayed.   Liver Function Tests Recent Labs    01/28/18 0542  AST 84*  ALT 16  ALKPHOS 52  BILITOT 2.6*  PROT 5.9*  ALBUMIN 3.6   No results for input(s): LIPASE, AMYLASE in the last 72 hours. Cardiac Enzymes No results for input(s): CKTOTAL, CKMB, CKMBINDEX, TROPONINI in the last 72 hours.  BNP: BNP (last 3 results) Recent Labs    06/13/17 2143 07/02/17 1531 12/30/17 0858  BNP 1,699.9* 532.9* 4,087.0*    ProBNP (last 3 results) No results for input(s): PROBNP in the last 8760 hours.   D-Dimer Recent Labs    01/27/18 1339  DDIMER 2.35*   Hemoglobin A1C No results for input(s): HGBA1C in the last 72 hours. Fasting Lipid Panel No results for input(s): CHOL, HDL, LDLCALC, TRIG, CHOLHDL, LDLDIRECT in the last 72 hours. Thyroid Function Tests No results for input(s): TSH, T4TOTAL, T3FREE, THYROIDAB in the last 72  hours.  Invalid input(s): FREET3  Other results:   Imaging    Dg Chest Portable 1 View  Result Date: 01/27/2018 CLINICAL DATA:  Status post LVAD placement EXAM: PORTABLE CHEST 1 VIEW COMPARISON:  01/18/2018 FINDINGS: Left ventricular assist device is now seen. Defibrillator is again noted and stable. Swan-Ganz catheter is noted in the pulmonary outflow tract. Left thoracostomy catheter mediastinal drain are noted in satisfactory position. Endotracheal tube and nasogastric catheter are noted in satisfactory position is well. Lungs are well aerated without pneumothorax. Mild vascular congestion is noted. Drain catheter is noted over the left lung base. IMPRESSION: Tubes and lines as described above. Mild vascular congestion is noted. Electronically Signed   By: Inez Catalina M.D.   On: 01/27/2018 13:27     Medications:     Scheduled Medications: . acetaminophen  1,000 mg Oral Q6H   Or  . acetaminophen (TYLENOL) oral liquid 160 mg/5 mL  1,000 mg Per Tube Q6H  . aspirin EC  325 mg Oral Daily   Or  . aspirin  324 mg Per Tube Daily   Or  . aspirin  300 mg Rectal Daily  . bisacodyl  10 mg Oral Daily   Or  . bisacodyl  10 mg Rectal Daily  . chlorhexidine gluconate (MEDLINE KIT)  15 mL Mouth Rinse BID  . docusate sodium  200 mg Oral Daily  . insulin aspart  0-24 Units Subcutaneous Q4H  . mouth rinse  15 mL Mouth Rinse 10 times per day  . metoCLOPramide (REGLAN) injection  10 mg Intravenous Q6H  . [START ON 01/29/2018] pantoprazole  40 mg Oral Daily  . rifampin  600 mg Oral Once  . sodium chloride flush  3 mL Intravenous Q12H    Infusions: . sodium chloride Stopped (01/28/18 0602)  . sodium chloride    . sodium chloride 10 mL/hr at 01/27/18 1400  . albumin human    . amiodarone 30 mg/hr (01/28/18 0700)  . cefUROXime (ZINACEF)  IV Stopped (01/28/18 0556)  . dexmedetomidine (PRECEDEX) IV infusion 0.7 mcg/kg/hr (01/28/18 0700)  . EPINEPHrine 4 mg in dextrose 5% 250 mL infusion (16  mcg/mL) Stopped (01/27/18  1629)  . fluconazole (DIFLUCAN) IV 100 mL/hr at 01/28/18 0700  . lactated ringers 20 mL/hr at 01/28/18 0700  . lactated ringers 20 mL/hr at 01/28/18 0700  . milrinone 0.3 mcg/kg/min (01/28/18 0700)  . nitroGLYCERIN Stopped (01/28/18 0622)  . norepinephrine (LEVOPHED) Adult infusion Stopped (01/28/18 0701)  . vancomycin Stopped (01/27/18 2111)    PRN Medications: sodium chloride, albumin human, midazolam, morphine injection, ondansetron (ZOFRAN) IV, oxyCODONE, sodium chloride flush, traMADol  Assessment/Plan   1. Chronic Systolic CHF: S/p HM3 27/78/24.  Nonischemic cardiomyopathy, diagnosed in 2011, initially followed at [facility].  LHC (3/19) with no significant CAD.  CPX (3/19) submaximal, but suggestive of mild to moderate HF limitation. HIV negative 3/19, history of drug/ETOH abuse in the past, no drugs now and had cut back ETOH considerably.  Echo 9/19 showed EF 20% with severe LV dilation, severe MR.  NYHA class IV symptoms. He has a wide LBBB.  RHC was done in 9/19 showing low output, CI 1.4.  He was admitted to start milrinone, unable to wean off milrinone and remained on 0.25 mcg/kg/min at home.  Medtronic ICD was placed, he has a His bundle lead but EP was unable to get an LV lead in place => His bundle pacing does not narrow his QRS and did not help [place] wean him off milrinone.  He did well on milrinone 0.25 mcg/kg/min, NYHA class II.  RHC 10/11 shows filling pressures and cardiac output to be fairly well optimized. HM3 placed 10/14. He is now off norepinephrine and epinephrine, remains on milrinone 0.3.  He is a-paced by epicardial leads at 80 with His bundle pacing from his pacemaker.  CVP 9-10 with good cardiac output. Speed decreased yesterday with multiple PI events, no more PI events since yesterday pm.  - Continue milrinone 0.3 today, possibly start weaning tomorrow.  - With weight up and CVP 9-10, will give a 1 dose of Lasix 40 mg IV in anticipation of  extubation today.  - Continue ASA 325 until INR > 1.8, to start warfarin.  2. ID: Treated with Augmentin for dental infection pre-op.  Post-op fever to 100.9, afebrile currently with normal WBCs.   3. ETOH Abuse: No longer drinking. No change.  4. Tobacco abuse: He has quit smoking since admission in 9/19.  - Will continue to encourage complete cessation.  5. Mitral regurgitation: Severe functional MR.  With LV dilated > 7 cm, suspect Mitraclip would be unlikely to help him appreciably.  Decreased MR s/p LVAD placement.   6. VT: Patient had episode of VT requiring ATP.   - Continue amiodarone gtt for now. Watch for post op Afib.  - Keep K> 4.0 Mg > 2.0.  K 4.0 today. - No further VT.  Shirley Friar, PA-C  01/28/2018 7:26 AM   VAD Team Pager [phone] (7am - 7am) ++VAD ISSUES ONLY++   Advanced Heart Failure Team Pager [phone] (M-F; [place])  Please contact [facility] for night-coverage after hours (4p -7a ) and weekends on [email or web address]  Patient seen with PA, agree with the above note.   Good cardiac output today, CVP 9-10.  No further PI events since speed decreased yesterday.  He is off pressors and remains on milrinone 0.3 and amiodarone gtt.   On exam, intubated but awake/following commands.  Normal LVAD sounds.  JVP 8-9 cm, no edema.   Continue milrinone 0.3 today, likely start wean tomorrow.  Hopefully extubate this morning, will give dose of Lasix 40 mg IV x  1 peri-extubation.    Low grade fever post-op, now afebrile.  WBCs normal.  Follow fever curve.   Warfarin per Dr. Prescott Gum, will remain on ASA 325 until INR > 1.8.   CRITICAL CARE Performed by: Loralie Champagne  Total critical care time: 35 minutes  Critical care time was exclusive of separately billable procedures and treating other patients.  Critical care was necessary to treat or prevent imminent or life-threatening deterioration.  Critical care was time spent personally by me on the following  activities: development of treatment plan with patient and/or surrogate as well as nursing, discussions with consultants, evaluation of patient's response to treatment, examination of patient, obtaining history from patient or surrogate, ordering and performing treatments and interventions, ordering and review of laboratory studies, ordering and review of radiographic studies, pulse oximetry and re-evaluation of patient's condition.  Loralie Champagne 01/28/2018 8:10 AM

## 2018-01-28 NOTE — Progress Notes (Signed)
Mardelle Matte, [place] paged about patient's fever of 101.5 - scheduled tylenol given. Plan for PICC line placement will be postponed. Cultures and antibiotics ordered. Ernestine Conrad will be removed, introducer will remain in place per Dr. Shirlee Latch.

## 2018-01-28 NOTE — Care Management Note (Addendum)
Case Management Note CM notes on 01/21/18 documented by [place]  Patient Details  Name: Carl Ferguson MRN: [id number] Date of Birth: 1966-03-21  Subjective/Objective:   Pt admitted for dental extraction - pt is on home milrninone                 Action/Plan:  PTA from home on IV milrinone with AHC - has HHRN.  Pt readmit and has recently utilized [facility]   Expected Discharge Date:                 Expected Discharge Plan:  [place]  In-House Referral:  NA  Discharge planning Services  CM Consult  Post Acute Care Choice:  Home Health, Durable Medical Equipment Choice offered to:  Patient  DME Arranged:  Walker rolling with seat, Bedside commode DME Agency:  [place]:  RN, Disease Management, PT [place] Agency:  [place]  Status of Service:  In process, will continue to follow  If discussed at Long Length of Stay Meetings, dates discussed:    Additional Comments: 01/31/18 @ 1539-Nolyn Eilert RNCM-CM met with patient to discuss transitional needs and PT recommendations for HHPT/DME. Patient was open to [facility] PTA, and would like to resume post discharge. Patient will need orders for [facility] services, rollator, 3n1 BSC with F2F. [place] referral will be given to [facility] liaison; AVS updated. CM will continue to follow along with VAD team for transitional needs.   01/28/2018 @ 1602-Attikus Bartoszek RNCM-CM consult acknowledged for VAD patient. S/p LVAD placement on 10/14 with CM to follow to assist with dispositional needs.   01/21/2018 Plan is for pt to have Swan placed 01/24/18. Tentative LVAD planned for early next week  [place], BSN, NCM-BC, ACM-RN [phone] 01/28/2018, 4:01 PM

## 2018-01-28 NOTE — Progress Notes (Signed)
Dr. Donata Clay updated on patient's fever and hypertension - verbal order received to administer 80 IV Solumedrol once and 10 mg PO Hydralazine Q8. Will implement and continue to monitor patient closely.

## 2018-01-28 NOTE — Progress Notes (Signed)
CSW met with patient at bedside. He was extubated earlier and sitting up in bed. Patient states he is glad to have tube out. Patient remembered his daughter visiting bedside this morning and reports she had to return home to [place]. Patient appears in good spirits and positive towards recovery for day 1 post op. CSW provided supportive intervention and will continue to follow throughout implant hospitalization. Raquel Sarna, [place], [place]

## 2018-01-28 NOTE — Progress Notes (Signed)
1 Day Post-Op Procedure(s) (LRB): INSERTION OF IMPLANTABLE LEFT VENTRICULAR ASSIST DEVICE HM3 (N/A) TRANSESOPHAGEAL ECHOCARDIOGRAM (TEE) (N/A) Subjective: Postop day 1 implantation of HeartMate 3 left ventricular assist device for nonischemic cardiomyopathy  Patient had stable postop night, weaning off inotropes and inhaled nitric oxide.  Patient was extubated postop day 1.  VAD flows and RV function have been adequate.  Good renal function with stable creatinine. Minimal chest tube output and patient has been started on postop Coumadin.  Patient has had postop fever probably drug reaction or inflammatory response to cardiac point bypass  Objective: Vital signs in last 24 hours: Temp:  [99 F (37.2 C)-103.5 F (39.7 C)] 103.1 F (39.5 C) (10/15 1745) Pulse Rate:  [25-119] 115 (10/15 1815) Cardiac Rhythm: A-V Sequential paced;Atrial paced (10/15 1000) Resp:  [6-36] 31 (10/15 1815) BP: (77-127)/(51-112) 122/109 (10/15 1800) SpO2:  [97 %-100 %] 99 % (10/15 1815) Arterial Line BP: (77-122)/(60-94) 102/88 (10/15 1815) FiO2 (%):  [40 %-50 %] 40 % (10/15 1124) Weight:  [72 kg] 72 kg (10/15 0500)  Hemodynamic parameters for last 24 hours: PAP: (17-45)/(8-24) 34/15 CVP:  [5 mmHg-18 mmHg] 8 mmHg CO:  [4.7 L/min-5.6 L/min] 4.7 L/min CI:  [2.8 L/min/m2-3.2 L/min/m2] 2.8 L/min/m2  Intake/Output from previous day: 10/14 0701 - 10/15 0700 In: 7691.1 [I.V.:3883.2; Blood:1135; NG/GT:70; IV Piggyback:2602.9] Out: 4850 [Urine:3220; Emesis/NG output:70; Blood:1000; Chest Tube:560] Intake/Output this shift: Total I/O In: 1524.1 [P.O.:480; I.V.:724.1; NG/GT:70; IV Piggyback:250] Out: 1610 [Urine:1330; Chest Tube:280]  Lungs clear Normal VAD home Abdomen soft Extremities warm  Lab Results: Recent Labs    01/27/18 1955  01/28/18 0542 01/28/18 0554  WBC 11.4*  --  8.6  --   HGB 9.6*   < > 9.6* 8.5*  HCT 27.5*   < > 28.3* 25.0*  PLT 168  --  124*  --    < > = values in this interval not  displayed.   BMET:  Recent Labs    01/27/18 1337  01/28/18 0542 01/28/18 0554  NA 138   < > 135 135  K 3.2*   < > 4.4 4.4  CL 100   < > 100 98  CO2 29  --  25  --   GLUCOSE 60*   < > 133* 135*  BUN 16   < > 14 16  CREATININE 0.96   < > 0.98 1.00  CALCIUM 8.4*  --  9.0  --    < > = values in this interval not displayed.    PT/INR:  Recent Labs    01/28/18 0634  LABPROT 16.3*  INR 1.33   ABG    Component Value Date/Time   PHART 7.448 01/28/2018 1336   HCO3 21.3 01/28/2018 1336   TCO2 22 01/28/2018 1336   ACIDBASEDEF 2.0 01/28/2018 1336   O2SAT 98.0 01/28/2018 1336   CBG (last 3)  Recent Labs    01/28/18 0518 01/28/18 0728 01/28/18 1215  GLUCAP 114* 101* 116*    Assessment/Plan: S/P Procedure(s) (LRB): INSERTION OF IMPLANTABLE LEFT VENTRICULAR ASSIST DEVICE HM3 (N/A) TRANSESOPHAGEAL ECHOCARDIOGRAM (TEE) (N/A) Proceed with postop recovery, mobilization, diuresis, and control fever.  Will place PICC line when fever resolves.   LOS: 9 days    Carl Ferguson 01/28/2018

## 2018-01-28 NOTE — Progress Notes (Signed)
Carl Ferguson for Infectious Disease  Date of Admission:  01/18/2018     Total days of antibiotics 1         ASSESSMENT/PLAN  Carl Ferguson is POD #1 from LVAD insertion with new onset fever of 101.5 following extubation. Source of the fever is currently unclear. PICC line removed today and does continue to have a central line. Blood cultures are in process and is on broad spectrum coverage with vancomycin and cefepime. There was previous concern for dental infection having completed Augmentin prior to LVAD procedure. These fevers could certainly be reactionary to his surgery, however infection can not be ruled out.   1. Agree with broad spectrum antibiotics with vancomycin and unasyn. 2. Monitor WBC count, fever curve and cultures.  3. Continue to watch his ICD site, non-tender.  4. Will follow with you.    Principal Problem:   Fever Active Problems:   Nonischemic cardiomyopathy ([name])   ICD (implantable cardioverter-defibrillator) in place   Diarrhea   Flu-like symptoms   . acetaminophen  1,000 mg Oral Q6H   Or  . acetaminophen (TYLENOL) oral liquid 160 mg/5 mL  1,000 mg Per Tube Q6H  . aspirin EC  325 mg Oral Daily   Or  . aspirin  324 mg Per Tube Daily   Or  . aspirin  300 mg Rectal Daily  . bisacodyl  10 mg Oral Daily   Or  . bisacodyl  10 mg Rectal Daily  . chlorhexidine gluconate (MEDLINE KIT)  15 mL Mouth Rinse BID  . docusate sodium  200 mg Oral Daily  . furosemide  40 mg Intravenous BID  . insulin aspart  0-24 Units Subcutaneous Q4H  . mouth rinse  15 mL Mouth Rinse 10 times per day  . metoCLOPramide (REGLAN) injection  10 mg Intravenous Q6H  . [START ON 01/29/2018] pantoprazole  40 mg Oral Daily  . sodium chloride flush  3 mL Intravenous Q12H  . warfarin  2.5 mg Oral q1800  . Warfarin - Physician Dosing Inpatient   Does not apply q1800    SUBJECTIVE:  Carl Ferguson was last seen by ID on 10/9 for fevers of unknown significance on 10/9 completing 7 days of  Augmentin. Following dental work his fevers resolved but did have diarrhea and vomiting. He underwent LVAD insertion on 32/95 without complication. He had a temperature of 101.5 this morning. Blood cultures were obtained and he was started on vancomycin and cefepime. No evidence of leukocytosis. Chest x-ray with improved venous congestion or pneumonia.   Carl Ferguson is having pain located in his chest. Otherwise he does not feel like he has a fever.   Allergies  Allergen Reactions  . Norflex [Orphenadrine] Swelling    FACE [ANGIOEDEMA]     Review of Systems: Review of Systems  Constitutional: Positive for fever. Negative for chills and diaphoresis.  Respiratory: Negative for cough, shortness of breath and wheezing.   Cardiovascular: Positive for chest pain. Negative for leg swelling.  Gastrointestinal: Negative for abdominal pain, constipation, diarrhea, nausea and vomiting.  Genitourinary: Negative for dysuria, frequency and urgency.  Skin: Negative for rash.      OBJECTIVE: Vitals:   01/28/18 1215 01/28/18 1230 01/28/18 1245 01/28/18 1300  BP:      Pulse: 97 96 98 92  Resp: 12 15 (!) 22 (!) 26  Temp: 99.7 F (37.6 C) 99.7 F (37.6 C) 99.5 F (37.5 C)   TempSrc:      SpO2: 100% 98%  99% 100%  Weight:      Height:       Body mass index is 24.86 kg/m.  Physical Exam  Constitutional: He is oriented to person, place, and time. He appears well-developed and well-nourished. No distress.  Cardiovascular: Normal rate, regular rhythm and intact distal pulses.  LVAD hum present. Right IJ Central line appears with dressing that is clean and dry and no evidence of infection.   Pulmonary/Chest: Effort normal and breath sounds normal. No stridor. No respiratory distress. He has no wheezes. He has no rales.  Chest tube with primarily sanguinous drainage.   Abdominal: Soft. Bowel sounds are normal.  Genitourinary:  Genitourinary Comments: Foley catheter patent.   Neurological: He is  alert and oriented to person, place, and time.  Skin: Skin is warm and dry.  Psychiatric: He has a normal mood and affect.    Lab Results Lab Results  Component Value Date   WBC 8.6 01/28/2018   HGB 8.5 (L) 01/28/2018   HCT 25.0 (L) 01/28/2018   MCV 92.5 01/28/2018   PLT 124 (L) 01/28/2018    Lab Results  Component Value Date   CREATININE 1.00 01/28/2018   BUN 16 01/28/2018   NA 135 01/28/2018   K 4.4 01/28/2018   CL 98 01/28/2018   CO2 25 01/28/2018    Lab Results  Component Value Date   ALT 16 01/28/2018   AST 84 (H) 01/28/2018   ALKPHOS 52 01/28/2018   BILITOT 2.6 (H) 01/28/2018     Microbiology: No results found for this or any previous visit (from the past 240 hour(s)).   Terri Piedra, NP [facility] for [facility] [phone] Pager  01/28/2018  2:03 PM

## 2018-01-28 NOTE — Procedures (Signed)
Extubation Procedure Note  Patient Details:   Name: Carl Ferguson DOB: May 27, 1965 MRN: [id number]   Airway Documentation:    Vent end date: 01/28/18 Vent end time: 1230   Evaluation  O2 sats: stable throughout Complications: No apparent complications Patient did tolerate procedure well. Bilateral Breath Sounds: Clear   Yes   Patient extubated to 4lnc with 4ppm Nitric. No complications. Vitals stable at this time. Patient tolerating well.   NIF-30 VC-1.5L RT will continue to monitor.  Ave Filter 01/28/2018, 12:50 PM

## 2018-01-28 NOTE — Progress Notes (Signed)
Pharmacy Antibiotic Note  Carl Ferguson is a 52 y.o. male admitted on 01/18/2018 with cardiogenic shock requiring LVAD implant on 10/14.  Pharmacy has been consulted for vancomycin/ cefepime dosing. Patient febrile, Tmax 101.3, s/p LVAD placement yesterday. Empiric antibiotics to be continued.   Plan: Continue Vancomycin 750mg  IV q12hr. Goal trough 15-20.  Plan for Vancomycin trough tomorrow morning prior to the AM dose Cefepime 2g q12h    Height: 5\' 7"  (170.2 cm) Weight: 158 lb 11.7 oz (72 kg) IBW/kg (Calculated) : 66.1  Temp (24hrs), Avg:100 F (37.8 C), Min:99 F (37.2 C), Max:101.3 F (38.5 C)  Recent Labs  Lab 01/26/18 0559 01/27/18 0350  01/27/18 1337 01/27/18 1955 01/27/18 1956 01/27/18 2003 01/28/18 0007 01/28/18 0542 01/28/18 0554  WBC 8.7 9.0  --  13.4* 11.4*  --   --   --  8.6  --   CREATININE 0.99 1.10   < > 0.96 1.07 1.00 1.00 1.00 0.98 1.00   < > = values in this interval not displayed.    Estimated Creatinine Clearance: 81.7 mL/min (by C-G formula based on SCr of 1 mg/dL).    Allergies  Allergen Reactions  . Norflex [Orphenadrine] Swelling    FACE [ANGIOEDEMA]    Antimicrobials this admission: Cefepime 10/5 >> 10/7; 10/15 >> Vanc 10/5 >>10/7; 10/14>> Fluconazole + Rifampin x1 10/15  Cefuroxime 10/14 >> 10/15 Augmentin 10/7 >> 10/13 Flagyl x1 10/3   Dose adjustments this admission:   Microbiology results: 10/15 BCx: * 10/15 UCx: * 10/15 Sputum: * 10/15 MRSA PCR: * 10/5 BCx: NGTD 10/5 UCx: NG   Thank you for allowing pharmacy to be a part of this patient's care.  Marcelino Freestone, PharmD PGY2 Cardiology Pharmacy Resident Phone [phone] Please check AMION for all Pharmacist numbers by unit 01/28/2018 10:41 AM

## 2018-01-28 NOTE — Op Note (Signed)
NAME: Carl Ferguson, Carl Ferguson MEDICAL RECORD [id number] ACCOUNT [id number] DATE OF BIRTH:[birth year] FACILITY: [facility] LOCATION: [facility] PHYSICIAN:Denton Derks VAN TRIGT III, MD  OPERATIVE REPORT  DATE OF PROCEDURE:  01/27/2018  OPERATION:  Implantation of HeartMate 3 left ventricular assist device.  PREOPERATIVE DIAGNOSES:  Nonischemic cardiomyopathy, preoperative cardiogenic shock, dependent on IV inotropes for surgery.  POSTOPERATIVE DIAGNOSIS:  Nonischemic cardiomyopathy, preoperative cardiogenic shock, dependent on IV inotropes for surgery.  SURGEON:  Mikey Bussing, MD  ASSISTANT:  Lowella Dandy, PA-C  ANESTHESIA:  General by Dr. Marcene Duos  CLINICAL NOTE:  The patient is a 52 year old male with nonischemic cardiomyopathy followed by the advanced heart failure clinic who required admissions for refractory heart failure earlier this year.  His ejection fraction was 15%, and coronary arteries  had no significant disease.  He required IV milrinone and was unable to exist outside the hospital without IV inotropic support.  He underwent evaluation for left ventricular device implantation and was found to be an acceptable candidate for destination  therapy.  He had been reviewed by a transplant program in the region but was felt not be a candidate due to his significant smoking history.  Prior to surgery, I discussed the details of the operation with the patient and his family including the use of general anesthesia and cardiopulmonary bypass, the location of the surgical incisions, and the expected postoperative recovery.  I discussed  with the patient the potential risks including the risks of stroke, bleeding, blood transfusion, postoperative infection, postoperative pulmonary problems including pleural effusion, postoperative organ failure, and death.  After reviewing these issues,  he demonstrated his understanding and agreed to proceed with surgery under what I felt was an informed  consent.  DESCRIPTION OF PROCEDURE:  The patient was brought from preop holding where final consent was documented and final concerns or questions were addressed.  The patient was placed supine on the operating table, and general anesthesia was induced.  He  remained stable.  A PA catheter and radial A-line were placed.  A transesophageal echo probe was placed.  This showed no evidence of PFO, no significant tricuspid regurgitation or aortic insufficiency.  RV function was mild to moderately reduced, and LV  function was severely depressed with severe mitral regurgitation.  The patient was prepped and draped, and a proper time-out was performed.  A sternal incision was made as well as a midabdominal counter incision and an incision for the exit site of the power cord.  The sternum was divided and the pericardium opened and  suspended.  Cannulation sutures were placed in the ascending aorta and right atrium, and the patient was cannulated.  CO2 was insufflated into the operative field, and the patient was placed on cardiopulmonary bypass.  A mild pocket was made by taking down the left hemidiaphragm and the posterior rectus sheath to allow better fit for the LVAD pump in this small individual.  The LV apex was exposed and supported on several laparotomy pads.  The apex was identified and an incision was made.  This was dilated with a tonsil clamp, and the Foley catheter with a cutting device were then inserted and the balloon inflated.  A plug  of LV apex was then cored out, and the Foley catheter was removed.  The trabeculae were removed from the anterior of the left ventricle, and a sump sucker was left in the LV cavity.  Pledgeted Ethibond sutures #1 were then placed around the circumference of the LV opening for the inflow cannula  numbering 12 total sutures.  The inflow cannula ring was then tied over the opening.  A light layer of CoSeal medical adhesive was applied.  The pump was brought to the  field and checked and found to be assembled properly.  The volume was left into the patient, and the head was placed in steep Trendelenburg.  While volume was left into the heart, the inflow cannula was inserted into the  sewing ring and the clamping mechanism was engaged.  The heart was emptied, and the position of the bed was returned back towards normal.  Next, the outflow graft was cut to the appropriate length and beveled, wrapped around the outside of the venous cannula.  A partial occluding clamp was placed on the ascending aorta and the aortotomy was performed.  The graft was sewn end-to-side with  running 4-0 Prolenes, and air was vented from the graft when the crossclamp was released.  There was good hemostasis, and the graft had good length and orientation.  A set of right atrial pacing temporary pacing wires were placed and connected to the pacing leads.  The driveline had been tunneled from the epigastrium to the midabdominal counter incision and then out the left upper quadrant exit site.  It was connected to the controller.  The patient was warmed and the lungs were expanded.  The pleural space had  been opened and drained.  The patient was started on low-dose inotropes and transitioned off cardiopulmonary bypass to HeartMate 3 LVAD support.  The patient was receiving inhaled nitric oxide at the time.  This process was monitored carefully with transesophageal echo.  The  speed after separation from bypass was approximately 5100.  However, with echo guidance, the speed was gradually increased up to 5700 because of LV filling and persistent mitral regurgitation.  At that speed, the flow was optimal at approximately 4.6-4.7  L/min.  Protamine was administered without adverse reaction.  The cannulas for bypass were removed.  The mediastinum was irrigated.  The superior pericardial fat was closed over the aorta.  The outflow graft was placed to the right side down along the posterior   aspect of the right atrium so as not to be under the sternum.  Drainage tubes were placed in the anterior mediastinum in the pocket of the LVAD in the left pleural space and brought out through separate incisions.  The sternum was closed with a wire, and the wires were tightened and the patient remained stable.  The pectoralis fascia was closed with interrupted #1 Vicryl, and subcutaneous and skin layers were closed using running Vicryl.  The counter incision in  the midabdomen was closed with layers of Vicryl, and the outflow site was closed with subcutaneous Vicryl and 2 nylon skin sutures.  At the time of transfer, the patient was stable with VAD flows over 4.5 L and a mixed venous saturation over 70%.  Total  cardiopulmonary bypass time was 101 minutes.  LN/NUANCE  D:01/28/2018 T:01/28/2018 JOB:[id number]

## 2018-01-29 ENCOUNTER — Inpatient Hospital Stay: Payer: Self-pay

## 2018-01-29 ENCOUNTER — Inpatient Hospital Stay (HOSPITAL_COMMUNITY): Payer: Medicaid Other

## 2018-01-29 DIAGNOSIS — I5020 Unspecified systolic (congestive) heart failure: Secondary | ICD-10-CM

## 2018-01-29 DIAGNOSIS — F1911 Other psychoactive substance abuse, in remission: Secondary | ICD-10-CM

## 2018-01-29 DIAGNOSIS — J810 Acute pulmonary edema: Secondary | ICD-10-CM

## 2018-01-29 DIAGNOSIS — Z98818 Other dental procedure status: Secondary | ICD-10-CM

## 2018-01-29 DIAGNOSIS — Z8619 Personal history of other infectious and parasitic diseases: Secondary | ICD-10-CM

## 2018-01-29 DIAGNOSIS — F1011 Alcohol abuse, in remission: Secondary | ICD-10-CM

## 2018-01-29 DIAGNOSIS — F17200 Nicotine dependence, unspecified, uncomplicated: Secondary | ICD-10-CM

## 2018-01-29 DIAGNOSIS — I447 Left bundle-branch block, unspecified: Secondary | ICD-10-CM

## 2018-01-29 DIAGNOSIS — R5081 Fever presenting with conditions classified elsewhere: Secondary | ICD-10-CM

## 2018-01-29 LAB — CBC WITH DIFFERENTIAL/PLATELET
Abs Immature Granulocytes: 0.07 10*3/uL (ref 0.00–0.07)
Basophils Absolute: 0 10*3/uL (ref 0.0–0.1)
Basophils Relative: 0 %
Eosinophils Absolute: 0 10*3/uL (ref 0.0–0.5)
Eosinophils Relative: 0 %
HCT: 27.6 % — ABNORMAL LOW (ref 39.0–52.0)
Hemoglobin: 9.8 g/dL — ABNORMAL LOW (ref 13.0–17.0)
Immature Granulocytes: 0 %
Lymphocytes Relative: 3 %
Lymphs Abs: 0.5 10*3/uL — ABNORMAL LOW (ref 0.7–4.0)
MCH: 31.8 pg (ref 26.0–34.0)
MCHC: 35.5 g/dL (ref 30.0–36.0)
MCV: 89.6 fL (ref 80.0–100.0)
Monocytes Absolute: 0.8 10*3/uL (ref 0.1–1.0)
Monocytes Relative: 5 %
Neutro Abs: 14.9 10*3/uL — ABNORMAL HIGH (ref 1.7–7.7)
Neutrophils Relative %: 92 %
Platelets: 128 10*3/uL — ABNORMAL LOW (ref 150–400)
RBC: 3.08 MIL/uL — ABNORMAL LOW (ref 4.22–5.81)
RDW: 13.5 % (ref 11.5–15.5)
WBC: 16.3 10*3/uL — ABNORMAL HIGH (ref 4.0–10.5)
nRBC: 0 % (ref 0.0–0.2)

## 2018-01-29 LAB — URINE CULTURE: CULTURE: NO GROWTH

## 2018-01-29 LAB — COMPREHENSIVE METABOLIC PANEL
ALT: 17 U/L (ref 0–44)
AST: 69 U/L — ABNORMAL HIGH (ref 15–41)
Albumin: 3.5 g/dL (ref 3.5–5.0)
Alkaline Phosphatase: 58 U/L (ref 38–126)
Anion gap: 10 (ref 5–15)
BUN: 11 mg/dL (ref 6–20)
CO2: 25 mmol/L (ref 22–32)
Calcium: 9.4 mg/dL (ref 8.9–10.3)
Chloride: 97 mmol/L — ABNORMAL LOW (ref 98–111)
Creatinine, Ser: 0.91 mg/dL (ref 0.61–1.24)
GFR calc Af Amer: >60 mL/min (ref >60)
GFR calc non Af Amer: >60 mL/min (ref >60)
Glucose, Bld: 152 mg/dL — ABNORMAL HIGH (ref 70–99)
Potassium: 3.7 mmol/L (ref 3.5–5.1)
Sodium: 132 mmol/L — ABNORMAL LOW (ref 135–145)
Total Bilirubin: 1.8 mg/dL — ABNORMAL HIGH (ref 0.3–1.2)
Total Protein: 6.4 g/dL — ABNORMAL LOW (ref 6.5–8.1)

## 2018-01-29 LAB — POCT I-STAT, CHEM 8
BUN: 17 mg/dL (ref 6–20)
BUN: 9 mg/dL (ref 6–20)
CALCIUM ION: 1.14 mmol/L — AB (ref 1.15–1.40)
CHLORIDE: 98 mmol/L (ref 98–111)
Calcium, Ion: 1.21 mmol/L (ref 1.15–1.40)
Chloride: 93 mmol/L — ABNORMAL LOW (ref 98–111)
Creatinine, Ser: 1 mg/dL (ref 0.61–1.24)
Creatinine, Ser: 0.6 mg/dL — ABNORMAL LOW (ref 0.61–1.24)
GLUCOSE: 133 mg/dL — AB (ref 70–99)
Glucose, Bld: 129 mg/dL — ABNORMAL HIGH (ref 70–99)
HCT: 28 % — ABNORMAL LOW (ref 39.0–52.0)
HEMATOCRIT: 28 % — AB (ref 39.0–52.0)
Hemoglobin: 9.5 g/dL — ABNORMAL LOW (ref 13.0–17.0)
Hemoglobin: 9.5 g/dL — ABNORMAL LOW (ref 13.0–17.0)
Potassium: 5.5 mmol/L — ABNORMAL HIGH (ref 3.5–5.1)
Potassium: 3.6 mmol/L (ref 3.5–5.1)
SODIUM: 132 mmol/L — AB (ref 135–145)
Sodium: 134 mmol/L — ABNORMAL LOW (ref 135–145)
TCO2: 28 mmol/L (ref 22–32)
TCO2: 28 mmol/L (ref 22–32)

## 2018-01-29 LAB — GLUCOSE, CAPILLARY
GLUCOSE-CAPILLARY: 184 mg/dL — AB (ref 70–99)
GLUCOSE-CAPILLARY: 181 mg/dL — AB (ref 70–99)
GLUCOSE-CAPILLARY: 135 mg/dL — AB (ref 70–99)
Glucose-Capillary: 148 mg/dL — ABNORMAL HIGH (ref 70–99)
Glucose-Capillary: 141 mg/dL — ABNORMAL HIGH (ref 70–99)
Glucose-Capillary: 82 mg/dL (ref 70–99)

## 2018-01-29 LAB — POCT I-STAT 3, ART BLOOD GAS (G3+)
ACID-BASE EXCESS: 2 mmol/L (ref 0.0–2.0)
Bicarbonate: 25.9 mmol/L (ref 20.0–28.0)
O2 SAT: 96 %
PCO2 ART: 36.6 mmHg (ref 32.0–48.0)
PH ART: 7.457 — AB (ref 7.350–7.450)
PO2 ART: 78 mmHg — AB (ref 83.0–108.0)
Patient temperature: 98.4
TCO2: 27 mmol/L (ref 22–32)

## 2018-01-29 LAB — PHOSPHORUS: Phosphorus: 3.2 mg/dL (ref 2.5–4.6)

## 2018-01-29 LAB — PROTIME-INR
INR: 1.31
Prothrombin Time: 16.1 seconds — ABNORMAL HIGH (ref 11.4–15.2)

## 2018-01-29 LAB — COOXEMETRY PANEL
CARBOXYHEMOGLOBIN: 1.6 % — AB (ref 0.5–1.5)
Methemoglobin: 1.1 % (ref 0.0–1.5)
O2 SAT: 61.5 %
Total hemoglobin: 10.1 g/dL — ABNORMAL LOW (ref 12.0–16.0)

## 2018-01-29 LAB — VANCOMYCIN, TROUGH: Vancomycin Tr: 7 ug/mL — ABNORMAL LOW (ref 15–20)

## 2018-01-29 LAB — MAGNESIUM: Magnesium: 2 mg/dL (ref 1.7–2.4)

## 2018-01-29 LAB — LACTATE DEHYDROGENASE: LDH: 324 U/L — ABNORMAL HIGH (ref 98–192)

## 2018-01-29 MED ORDER — VANCOMYCIN HCL 10 G IV SOLR
1250.0000 mg | Freq: Two times a day (BID) | INTRAVENOUS | Status: DC
  Administered 2018-01-29 – 2018-01-31 (×4): 1250 mg via INTRAVENOUS
  Filled 2018-01-29 (×4): qty 1250

## 2018-01-29 MED ORDER — MAGNESIUM SULFATE 2 GM/50ML IV SOLN
2.0000 g | Freq: Once | INTRAVENOUS | Status: AC
  Administered 2018-01-29: 2 g via INTRAVENOUS
  Filled 2018-01-29: qty 50

## 2018-01-29 MED ORDER — INSULIN ASPART 100 UNIT/ML ~~LOC~~ SOLN
0.0000 [IU] | Freq: Three times a day (TID) | SUBCUTANEOUS | Status: DC
  Administered 2018-01-30: 4 [IU] via SUBCUTANEOUS
  Administered 2018-01-30 (×2): 2 [IU] via SUBCUTANEOUS
  Administered 2018-01-31: 8 [IU] via SUBCUTANEOUS
  Administered 2018-01-31 – 2018-02-04 (×9): 2 [IU] via SUBCUTANEOUS
  Administered 2018-02-04: 1 [IU] via SUBCUTANEOUS
  Administered 2018-02-05 – 2018-02-10 (×5): 2 [IU] via SUBCUTANEOUS

## 2018-01-29 MED ORDER — CHLORHEXIDINE GLUCONATE CLOTH 2 % EX PADS
6.0000 | MEDICATED_PAD | Freq: Every day | CUTANEOUS | Status: DC
  Administered 2018-01-29: 6 via TOPICAL

## 2018-01-29 MED ORDER — BOOST PO LIQD
237.0000 mL | Freq: Three times a day (TID) | ORAL | Status: DC
  Administered 2018-01-30 – 2018-01-31 (×2): 237 mL via ORAL
  Filled 2018-01-29 (×5): qty 237

## 2018-01-29 MED ORDER — POTASSIUM CHLORIDE 10 MEQ/50ML IV SOLN
10.0000 meq | INTRAVENOUS | Status: AC
  Administered 2018-01-29 (×2): 10 meq via INTRAVENOUS
  Filled 2018-01-29 (×2): qty 50

## 2018-01-29 MED ORDER — ORAL CARE MOUTH RINSE
15.0000 mL | Freq: Two times a day (BID) | OROMUCOSAL | Status: DC
  Administered 2018-01-31 – 2018-02-09 (×14): 15 mL via OROMUCOSAL

## 2018-01-29 MED ORDER — POTASSIUM CHLORIDE 10 MEQ/50ML IV SOLN
10.0000 meq | INTRAVENOUS | Status: AC | PRN
  Administered 2018-01-29 (×3): 10 meq via INTRAVENOUS
  Filled 2018-01-29 (×3): qty 50

## 2018-01-29 MED ORDER — SODIUM CHLORIDE 0.9% FLUSH: 10.0000 mL | Freq: Two times a day (BID) | INTRAVENOUS | Status: DC

## 2018-01-29 MED ORDER — FUROSEMIDE 10 MG/ML IJ SOLN
8.0000 mg/h | INTRAVENOUS | Status: DC
  Administered 2018-01-29: 10 mg/h via INTRAVENOUS
  Administered 2018-01-30 – 2018-01-31 (×2): 8 mg/h via INTRAVENOUS
  Filled 2018-01-29 (×2): qty 25
  Filled 2018-01-29: qty 20
  Filled 2018-01-29: qty 25

## 2018-01-29 MED ORDER — POTASSIUM CHLORIDE 10 MEQ/50ML IV SOLN
10.0000 meq | INTRAVENOUS | Status: AC
  Administered 2018-01-29 (×3): 10 meq via INTRAVENOUS
  Filled 2018-01-29 (×3): qty 50

## 2018-01-29 MED ORDER — AMIODARONE IV BOLUS ONLY 150 MG/100ML
150.0000 mg | Freq: Once | INTRAVENOUS | Status: AC
  Administered 2018-01-29: 150 mg via INTRAVENOUS
  Filled 2018-01-29: qty 100

## 2018-01-29 MED ORDER — WARFARIN SODIUM 2 MG PO TABS
4.0000 mg | ORAL_TABLET | Freq: Every day | ORAL | Status: DC
  Administered 2018-01-29: 4 mg via ORAL
  Filled 2018-01-29: qty 2

## 2018-01-29 MED ORDER — HYDRALAZINE HCL 10 MG PO TABS
10.0000 mg | ORAL_TABLET | Freq: Four times a day (QID) | ORAL | Status: DC
  Administered 2018-01-29 – 2018-01-30 (×3): 10 mg via ORAL
  Filled 2018-01-29 (×3): qty 1

## 2018-01-29 NOTE — Evaluation (Signed)
Physical Therapy Evaluation Patient Details Name: Carl Ferguson MRN: [id number] DOB: 1965/07/02 Today's Date: 01/29/2018   History of Present Illness  Carl Ferguson is a 52 y.o. male with systolic CHF, NICM, Chronic LBBB, Tobacco abuse, prior ETOH abuse, prior substance abuse, and h/o of device placements with extractions due to infection. Pt with ICD placed 01/03/18.  Underwent HeartMate 3 implantation on October 14  Clinical Impression  Pt admitted with above diagnosis. Pt currently with functional limitations due to the deficits listed below (see PT Problem List). Pt was able to ambulate around entire unit with Carley Hammed walker today.  Pt tolerated well with good response of VS. Initiated  education  regarding LVAD equipment.   Pt will benefit from skilled PT to increase their independence and safety with mobility to allow discharge to the venue listed below.    HR 102-108 bpm Initial BP 112/84, post BP 111/62 94% on 4.4% Nitric oxide Flow 4.6 Speed 5600 PI 4.3-4.5 Power 4.3  Follow Up Recommendations Home health PT;Supervision/Assistance - 24 hour    Equipment Recommendations  3in1 (PT)(rollator)    Recommendations for Other Services       Precautions / Restrictions Precautions Precautions: Fall;Sternal Precaution Comments: LVAD      Mobility  Bed Mobility Overal bed mobility: Needs Assistance Bed Mobility: Sit to Supine       Sit to supine: Max assist;+2 for physical assistance   General bed mobility comments: difficult for pt to get into bed on his side and then roll back.  Cues needed and assist.    Transfers Overall transfer level: Needs assistance Equipment used: (EVA walker) Transfers: Sit to/from Stand Sit to Stand: Min guard         General transfer comment: Cues for hand placement across chest or on knees to stand with pt able to do with cues and no assist.    Ambulation/Gait Ambulation/Gait assistance: Min assist;+2 safety/equipment(4 staff -1 guard  pt, 1 IV pole, RT Nitric and pushed chair) Gait Distance (Feet): 370 Feet Assistive device: (EVA walker) Gait Pattern/deviations: Step-through pattern;Decreased stride length;Drifts right/left;Trunk flexed   Gait velocity interpretation: <1.31 ft/sec, indicative of household ambulator General Gait Details: Overall, pt did well.  constant cues not to lean on Eva walker and to stand tall.  Pt needed a few cues for pursed lip breathe.  LVAD parameters stable.   Stairs            Wheelchair Mobility    Modified Rankin (Stroke Patients Only)       Balance Overall balance assessment: Needs assistance Sitting-balance support: No upper extremity supported;Feet supported Sitting balance-Leahy Scale: Fair     Standing balance support: Bilateral upper extremity supported;During functional activity Standing balance-Leahy Scale: Poor Standing balance comment: relies on UE support                             Pertinent Vitals/Pain Pain Assessment: 0-10 Pain Score: 10-Worst pain ever Pain Location: sternum Pain Descriptors / Indicators: Aching;Guarding;Grimacing Pain Intervention(s): Limited activity within patient's tolerance;Monitored during session;Repositioned;Patient requesting pain meds-RN notified    Home Living Family/patient expects to be discharged to:: Private residence Living Arrangements: Spouse/significant other Available Help at Discharge: Family;Friend(s);Available 24 hours/day   Home Access: Stairs to enter Entrance Stairs-Rails: Right;Left;Can reach both Entrance Stairs-Number of Steps: 10 Home Layout: One level Home Equipment: None      Prior Function Level of Independence: Independent  Comments: steps were difficult     Hand Dominance        Extremity/Trunk Assessment   Upper Extremity Assessment Upper Extremity Assessment: Defer to OT evaluation    Lower Extremity Assessment Lower Extremity Assessment: Generalized  weakness    Cervical / Trunk Assessment Cervical / Trunk Assessment: Kyphotic  Communication   Communication: No difficulties  Cognition Arousal/Alertness: Awake/alert Behavior During Therapy: WFL for tasks assessed/performed Overall Cognitive Status: Within Functional Limits for tasks assessed                                        General Comments General comments (skin integrity, edema, etc.): Pt educated on LVAD equipment and observed today.      Exercises     Assessment/Plan    PT Assessment Patient needs continued PT services  PT Problem List Decreased activity tolerance;Decreased balance;Decreased mobility;Decreased knowledge of use of DME;Decreased safety awareness;Decreased knowledge of precautions;Cardiopulmonary status limiting activity;Pain       PT Treatment Interventions DME instruction;Gait training;Functional mobility training;Therapeutic activities;Therapeutic exercise;Balance training;Stair training;Patient/family education    PT Goals (Current goals can be found in the Care Plan section)  Acute Rehab PT Goals Patient Stated Goal: to go home PT Goal Formulation: With patient Time For Goal Achievement: 02/12/18 Potential to Achieve Goals: Good    Frequency Min 3X/week   Barriers to discharge        Co-evaluation               AM-PAC PT "6 Clicks" Daily Activity  Outcome Measure Difficulty turning over in bed (including adjusting bedclothes, sheets and blankets)?: Unable Difficulty moving from lying on back to sitting on the side of the bed? : Unable Difficulty sitting down on and standing up from a chair with arms (e.g., wheelchair, bedside commode, etc,.)?: A Little Help needed moving to and from a bed to chair (including a wheelchair)?: A Little Help needed walking in hospital room?: A Lot Help needed climbing 3-5 steps with a railing? : A Lot 6 Click Score: 12    End of Session Equipment Utilized During Treatment: Gait  belt;Oxygen(Nitric 4% and 4LO2) Activity Tolerance: Patient tolerated treatment well Patient left: in bed;with call bell/phone within reach;with nursing/sitter in room Nurse Communication: Mobility status;Patient requests pain meds PT Visit Diagnosis: Unsteadiness on feet (R26.81);Muscle weakness (generalized) (M62.81);Pain Pain - part of body: (sternum)    Time: 2637-8588 PT Time Calculation (min) (ACUTE ONLY): 56 min   Charges:   PT Evaluation $PT Eval Moderate Complexity: 1 Mod PT Treatments $Gait Training: 8-22 mins $Self Care/Home Management: 23-37        Juletta Berhe,PT Acute Rehabilitation Services Pager:  [phone]  Office:  [phone]    Berline Lopes 01/29/2018, 12:04 PM

## 2018-01-29 NOTE — Progress Notes (Signed)
HeartMate 3 Rounding Note  HM# Implant 10-14 for DT indication Subjective:   Nonischemic cardiomyopathy, EF 15%, atrial dependent at home  Patient with adequate function, extubated postop day 1,mobilized OOB to chair  Patient developed postop fever, probably inflammatory response to cardiopulmonary bypass.  Now improved.  Patient out of bed to chair, pain improved Chest x-ray wet-we will start Lasix drip LVAD parameters satisfactory with few PI events  Patient continues on low-dose milrinone for RV support. Will wean off nitric oxide today.  LVAD INTERROGATION:  HeartMate 3 LVAD:  Flow 4.7 L/min  liters/min, speed 5600 rpm, power 4.1, PI 3.2.  Controller connected.   Objective:    Vital Signs:   Temp:  [98.4 F (36.9 C)-103.5 F (39.7 C)] 98.6 F (37 C) (10/16 0700) Pulse Rate:  [58-116] 89 (10/16 0800) Resp:  [9-36] 24 (10/16 0800) BP: (95-127)/(67-112) 111/67 (10/16 0800) SpO2:  [94 %-100 %] 98 % (10/16 0800) Arterial Line BP: (88-122)/(70-97) 107/77 (10/16 0800) FiO2 (%):  [4 %-40 %] 4 % (10/16 0800) Weight:  [68 kg] 68 kg (10/16 0500) Last BM Date: 01/25/18 Mean arterial Pressure 75-85 mm Hg  Intake/Output:   Intake/Output Summary (Last 24 hours) at 01/29/2018 0847 Last data filed at 01/29/2018 0800 Gross per 24 hour  Intake 2265.79 ml  Output 2743 ml  Net -477.21 ml     Physical Exam: General:  Well appearing. No resp difficulty HEENT: normal Neck: supple. JVP . Carotids 2+ bilat; no bruits. No lymphadenopathy or thryomegaly appreciated. Cor: Mechanical heart sounds with LVAD hum present. Lungs: clear Abdomen: soft, nontender, nondistended. No hepatosplenomegaly. No bruits or masses. Good bowel sounds. Extremities: no cyanosis, clubbing, rash, edema Neuro: alert & orientedx3, cranial nerves grossly intact. moves all 4 extremities w/o difficulty. Affect pleasant  Telemetry: av pacing  Labs: Basic Metabolic Panel: Recent Labs  Lab 01/27/18 0350   01/27/18 1337 01/27/18 1955  01/28/18 0542 01/28/18 0554 01/28/18 1714 01/28/18 1743 01/29/18 0405  NA 136   < > 138  --    < > 135 135 131* 131* 132*  K 4.0   < > 3.2*  --    < > 4.4 4.4 3.8 3.9 3.7  CL 96*   < > 100  --    < > 100 98 99 96* 97*  CO2 32  --  29  --   --  25  --  22  --  25  GLUCOSE 99   < > 60*  --    < > 133* 135* 202* 208* 152*  BUN 18   < > 16  --    < > _0 CREATININE 1.10   < > 0.96 1.07   < > 0.98 1.00 1.12 0.90 0.91  CALCIUM 9.7  --  8.4*  --   --  9.0  --  9.0  --  9.4  MG  --   --  2.1 3.4*  --  2.7*  --  2.2  --  2.0  PHOS  --   --   --   --   --  3.6  --   --   --  3.2   < > = values in this interval not displayed.    Liver Function Tests: Recent Labs  Lab 01/28/18 0542 01/29/18 0405  AST 84* 69*  ALT 16 17  ALKPHOS 52 58  BILITOT 2.6* 1.8*  PROT 5.9* 6.4*  ALBUMIN 3.6 3.5   No  results for input(s): LIPASE, AMYLASE in the last 168 hours. No results for input(s): AMMONIA in the last 168 hours.  CBC: Recent Labs  Lab 01/26/18 0559 01/27/18 0350  01/27/18 1337 01/27/18 1339 01/27/18 1955  01/28/18 0542 01/28/18 0554 01/28/18 1714 01/28/18 1743 01/29/18 0405  WBC 8.7 9.0  --  13.4*  --  11.4*  --  8.6  --  15.6*  --  16.3*  NEUTROABS 5.5 6.1  --  10.8*  --   --   --  6.9  --   --   --  14.9*  HGB 13.1 13.1   < > 12.0*  --  9.6*   < > 9.6* 8.5* 10.6* 10.2* 9.8*  HCT 38.0* 36.6*   < > 34.9*  --  27.5*   < > 28.3* 25.0* 30.4* 30.0* 27.6*  MCV 90.7 90.1  --  90.4  --  91.4  --  92.5  --  91.0  --  89.6  PLT 289 261   < > 177 192 168  --  124*  --  144*  --  128*   < > = values in this interval not displayed.    INR: Recent Labs  Lab 01/27/18 1339 01/28/18 0634 01/29/18 0405  INR 1.26 1.33 1.31    Other results:  EKG:   Imaging: Dg Chest Port 1 View  Result Date: 01/28/2018 CLINICAL DATA:  ETT.  LVAD placement. EXAM: PORTABLE CHEST 1 VIEW COMPARISON:  January 27, 2018 FINDINGS: The ETT, PA catheter, AICD  device, and LVAD are in stable position. Stable cardiomegaly. No change in the hilar mediastinum. Mild vascular congestion is mildly improved. No other change. IMPRESSION: 1. Cardiomegaly and improving pulmonary venous congestion. 2. Stable support apparatus. Electronically Signed   By: Dorise Bullion III M.D   On: 01/28/2018 08:42   Dg Chest Portable 1 View  Result Date: 01/27/2018 CLINICAL DATA:  Status post LVAD placement EXAM: PORTABLE CHEST 1 VIEW COMPARISON:  01/18/2018 FINDINGS: Left ventricular assist device is now seen. Defibrillator is again noted and stable. Swan-Ganz catheter is noted in the pulmonary outflow tract. Left thoracostomy catheter mediastinal drain are noted in satisfactory position. Endotracheal tube and nasogastric catheter are noted in satisfactory position is well. Lungs are well aerated without pneumothorax. Mild vascular congestion is noted. Drain catheter is noted over the left lung base. IMPRESSION: Tubes and lines as described above. Mild vascular congestion is noted. Electronically Signed   By: Inez Catalina M.D.   On: 01/27/2018 13:27   [place] Ekg Site Rite  Result Date: 01/28/2018 If Site Rite image not attached, placement could not be confirmed due to current cardiac rhythm.     Medications:     Scheduled Medications: . acetaminophen  1,000 mg Oral Q6H   Or  . acetaminophen (TYLENOL) oral liquid 160 mg/5 mL  1,000 mg Per Tube Q6H  . amiodarone  200 mg Oral BID  . aspirin EC  325 mg Oral Daily   Or  . aspirin  324 mg Per Tube Daily   Or  . aspirin  300 mg Rectal Daily  . bisacodyl  10 mg Oral Daily   Or  . bisacodyl  10 mg Rectal Daily  . chlorhexidine gluconate (MEDLINE KIT)  15 mL Mouth Rinse BID  . docusate sodium  200 mg Oral Daily  . hydrALAZINE  10 mg Oral Q8H  . insulin aspart  0-24 Units Subcutaneous Q4H  . mouth rinse  15 mL Mouth Rinse  QID  . metoCLOPramide (REGLAN) injection  10 mg Intravenous Q6H  . pantoprazole  40 mg Oral Daily  .  sodium chloride flush  3 mL Intravenous Q12H  . warfarin  4 mg Oral q1800  . Warfarin - Physician Dosing Inpatient   Does not apply q1800     Infusions: . sodium chloride Stopped (01/28/18 1600)  . sodium chloride Stopped (01/28/18 0600)  . sodium chloride 10 mL/hr at 01/27/18 1400  . ampicillin-sulbactam (UNASYN) IV Stopped (01/29/18 0604)  . furosemide (LASIX) infusion 10 mg/hr (01/29/18 0815)  . lactated ringers Stopped (01/28/18 0800)  . lactated ringers 10 mL/hr at 01/29/18 0800  . milrinone 0.25 mcg/kg/min (01/29/18 0800)  . nitroGLYCERIN Stopped (01/29/18 0233)  . potassium chloride 10 mEq (01/29/18 0803)  . vancomycin 750 mg (01/29/18 0807)     PRN Medications:  sodium chloride, hydrALAZINE, morphine injection, ondansetron (ZOFRAN) IV, oxyCODONE, sodium chloride flush, traMADol   Assessment:  HeartMate 3 implantation on October 14 Preop history of nonischemic cardiomyopathy, severe MR, EF 15% Patient ICD-pacemaker, chronically paced rhythm RV function has been adequate postop, episode of atrial fibrillation treated with amiodarone  Plan/Discussion:   Will wean off inhaled nitric oxide today Chest tube output overnight 250 cc-leave in place Chest x-ray appears wet-start Lasix drip Continue with oral Coumadin loading Advance diet as tolerated Continue broad-spectrum antibiotics until cultures return Place PICC line after fever resolves I reviewed the LVAD parameters from today, and compared the results to the patient's prior recorded data.  No programming changes were made.  The LVAD is functioning within specified parameters.  The patient performs LVAD self-test daily.  LVAD interrogation was negative for any significant power changes, alarms or PI events/speed drops.  LVAD equipment check completed and is in good working order.  Back-up equipment present.   LVAD education done on emergency procedures and precautions and reviewed exit site care.  Length of Stay:  [place] III 01/29/2018, 8:47 AM

## 2018-01-29 NOTE — Progress Notes (Signed)
[facility]    Date of Admission:  01/18/2018   Total days of antibiotics 13           ID: JHADEN DROTAR is a 52 y.o. male  with systolic CHF, NICM, Chronic LBBB, Tobacco abuse, prior ETOH abuse, prior substance abuse, and h/o of device placements with extractions due to infection. Pt with ICD placed 01/03/18.  Underwent HeartMate 3 implantation on October 14. Having post op fevers Principal Problem:   Fever Active Problems:   Nonischemic cardiomyopathy (HCC)   ICD (implantable cardioverter-defibrillator) in place   Diarrhea   Flu-like symptoms    Subjective: Still feeling febrile  last night, with temps of 103F last night. Feeling diaphoretic this morning but afebrile. Some discomfort after working with PT/OT  Medications:  . acetaminophen  1,000 mg Oral Q6H   Or  . acetaminophen (TYLENOL) oral liquid 160 mg/5 mL  1,000 mg Per Tube Q6H  . amiodarone  200 mg Oral BID  . aspirin EC  325 mg Oral Daily   Or  . aspirin  324 mg Per Tube Daily   Or  . aspirin  300 mg Rectal Daily  . bisacodyl  10 mg Oral Daily   Or  . bisacodyl  10 mg Rectal Daily  . Chlorhexidine Gluconate Cloth  6 each Topical Daily  . docusate sodium  200 mg Oral Daily  . hydrALAZINE  10 mg Oral Q8H  . insulin aspart  0-24 Units Subcutaneous Q4H  . mouth rinse  15 mL Mouth Rinse BID  . metoCLOPramide (REGLAN) injection  10 mg Intravenous Q6H  . pantoprazole  40 mg Oral Daily  . sodium chloride flush  10-40 mL Intracatheter Q12H  . sodium chloride flush  3 mL Intravenous Q12H  . warfarin  4 mg Oral q1800  . Warfarin - Physician Dosing Inpatient   Does not apply q1800    Objective: Vital signs in last 24 hours: Temp:  [98.4 F (36.9 C)-103.5 F (39.7 C)] 98.6 F (37 C) (10/16 0700) Pulse Rate:  [58-116] 96 (10/16 1518) Resp:  [9-36] 23 (10/16 1518) BP: (89-127)/(67-112) 89/75 (10/16 1518) SpO2:  [94 %-100 %] 97 % (10/16 1518) Arterial Line BP: (88-125)/(70-99) 102/85  (10/16 1518) Weight:  [68 kg] 68 kg (10/16 0500) Physical Exam  Constitutional: He is oriented to person, place, and time. He appears well-developed and well-nourished. No distress.  HENT:  Mouth/Throat: Oropharynx is clear and moist. No oropharyngeal exudate.  Cardiovascular: generator hum. Incision is bandaged. No surrounding erythema Pulmonary/Chest: Effort normal and breath sounds normal. No respiratory distress. He has no wheezes.  Abdominal: Soft. Bowel sounds are normal. He exhibits no distension. There is no tenderness.driveline is bandaged  Lymphadenopathy:  He has no cervical adenopathy.  Neurological: He is alert and oriented to person, place, and time.  Skin: Skin is warm and dry. No rash noted. No erythema.  Psychiatric: He has a normal mood and affect. His behavior is normal.     Lab Results Recent Labs    01/28/18 1714  01/29/18 0405 01/29/18 1515  WBC 15.6*  --  16.3*  --   HGB 10.6*   < > 9.8* 9.5*  HCT 30.4*   < > 27.6* 28.0*  NA 131*   < > 132* 132*  K 3.8   < > 3.7 3.6  CL 99   < > 97* 93*  CO2 22  --  25  --   BUN 14   < >  11 9  CREATININE 1.12   < > 0.91 0.60*   < > = values in this interval not displayed.   Liver Panel Recent Labs    01/28/18 0542 01/29/18 0405  PROT 5.9* 6.4*  ALBUMIN 3.6 3.5  AST 84* 69*  ALT 16 17  ALKPHOS 52 58  BILITOT 2.6* 1.8*    Microbiology: 10/15 blood cx ngtd Studies/Results: Dg Chest Port 1 View  Result Date: 01/29/2018 CLINICAL DATA:  Chest tube placement. EXAM: PORTABLE CHEST 1 VIEW COMPARISON:  Radiograph of January 28, 2018. FINDINGS: Stable cardiomegaly. Stable position of right-sided pacemaker. Endotracheal and nasogastric tubes have been removed. Left ventricular assist device is unchanged in position. Stable left-sided chest tube is noted without pneumothorax. Increased central pulmonary vascular congestion and bibasilar opacities are noted concerning for worsening edema or atelectasis with probable mild  right pleural effusion. Bony thorax is unremarkable. IMPRESSION: Endotracheal and nasogastric tubes have been removed. Stable position of left ventricular assist device. Left-sided chest tube is again noted without pneumothorax. Increased bibasilar opacities are noted concerning for worsening edema or atelectasis with probable mild right pleural effusion. Electronically Signed   By: Lupita Raider, M.D.   On: 01/29/2018 10:22   Dg Chest Port 1 View  Result Date: 01/28/2018 CLINICAL DATA:  ETT.  LVAD placement. EXAM: PORTABLE CHEST 1 VIEW COMPARISON:  January 27, 2018 FINDINGS: The ETT, PA catheter, AICD device, and LVAD are in stable position. Stable cardiomegaly. No change in the hilar mediastinum. Mild vascular congestion is mildly improved. No other change. IMPRESSION: 1. Cardiomegaly and improving pulmonary venous congestion. 2. Stable support apparatus. Electronically Signed   By: Gerome Sam III M.D   On: 01/28/2018 08:42   [place] Ekg Site Rite  Result Date: 01/29/2018 If Site Rite image not attached, placement could not be confirmed due to current cardiac rhythm.  [place] Ekg Site Rite  Result Date: 01/28/2018 If Site Rite image not attached, placement could not be confirmed due to current cardiac rhythm.   Assessment/Plan:  Post op fevers initially thought to be non-infectious, still remains unclear if infectious source. Patient still reports subjective fevers. For now would recommend continuing on iv abtx and reassess his blood cx at 72hr.   Hx of recent dental extractions = is currently being covered with amp/sub  Pulmonary edema = noted on cxr today, not unexpected given recent LVAD placement.. Will continue to monitor  [facility] Cell: [phone] Pager: [phone]  01/29/2018, 3:32 PM

## 2018-01-29 NOTE — Progress Notes (Signed)
RT was asked to walk with patient and PT and RN while patient on nitric.  Patient tolerated walking well with no respiratory compromise.  Will continue to monitor.

## 2018-01-29 NOTE — Progress Notes (Addendum)
Pharmacy Antibiotic Note  Carl Ferguson is a 52 y.o. male admitted on 01/18/2018 with cardiogenic shock requiring LVAD implant on 10/14.  Pharmacy has been consulted for vancomycin/ cefepime dosing. Patient febrile, Tmax 103.5, s/p LVAD placement 10/14. Empiric antibiotics to be continued.   Vancomycin trough obtained to assess safety and efficacy of vancomycin therapy. Vancomycin trough subtherapeutic at 7 on vancomycin 750 mg IV q12h prior to the fifth dose. Trough drawn appropriately ~12 hours after last dose. Renal function has remained stable.   Plan: Increase vancomycin to 1250mg  IV q12hr. Goal trough 15-20 mcg/mL.  Unasyn 3 g IV q6h   Height: 5\' 7"  (170.2 cm) Weight: 149 lb 14.6 oz (68 kg) IBW/kg (Calculated) : 66.1  Temp (24hrs), Avg:100.3 F (37.9 C), Min:98.4 F (36.9 C), Max:103.5 F (39.7 C)  Recent Labs  Lab 01/27/18 1337 01/27/18 1955  01/28/18 0542 01/28/18 0554 01/28/18 1714 01/28/18 1743 01/29/18 0405 01/29/18 0749  WBC 13.4* 11.4*  --  8.6  --  15.6*  --  16.3*  --   CREATININE 0.96 1.07   < > 0.98 1.00 1.12 0.90 0.91  --   VANCOTROUGH  --   --   --   --   --   --   --   --  7*   < > = values in this interval not displayed.    Estimated Creatinine Clearance: 89.8 mL/min (by C-G formula based on SCr of 0.91 mg/dL).    Allergies  Allergen Reactions  . Norflex [Orphenadrine] Swelling    FACE [ANGIOEDEMA]    Antimicrobials this admission:  Unasyn 10/15 >>  Vanc 10/5 >>10/7; 10/14>> Fluconazole + Rifampin x1 10/15  Cefuroxime 10/14 >> 10/15 Cefepime 10/5 >> 10/7 Augmentin 10/7 >> 10/13 Flagyl x1 10/3   Dose adjustments this admission: 10/16  VT 7 on vanc 750 mg IV q12h  10/6    VT 12 on vanc 750 mg IV q8h (Scr 1.37 > 0.86)  Microbiology results: 10/15 BCx: NG < 24 hr 10/15 UCx: NG  10/15 Sputum:  NG < 24 hr 10/5 BCx: NGTD 10/5 UCx: NG 9/16 MRSA PCR: negative    Thank you for allowing pharmacy to be a part of this patient's  care.  Marcelino Freestone, PharmD PGY2 Cardiology Pharmacy Resident Phone [phone] Please check AMION for all Pharmacist numbers by unit 01/29/2018 9:40 AM

## 2018-01-29 NOTE — Evaluation (Signed)
Occupational Therapy Evaluation Patient Details Name: Carl Ferguson MRN: [id number] DOB: 1965-07-02 Today's Date: 01/29/2018    History of Present Illness Carl Ferguson is a 52 y.o. male with systolic CHF, NICM, Chronic LBBB, Tobacco abuse, prior ETOH abuse, prior substance abuse, and h/o of device placements with extractions due to infection. Pt with ICD placed 01/03/18.  Underwent HeartMate 3 implantation on October 14   Clinical Impression   Pt is typically independent. Limited by multiple lines, generalized weakness, decreased activity tolerance, pain and impaired balance. Pt's goal is to watch his grandchildren grow up. He currently requires set up to moderate assistance for ADL. Pt likely to progress well. Will follow acutely.    Follow Up Recommendations  No OT follow up    Equipment Recommendations  3 in 1 bedside commode    Recommendations for Other Services       Precautions / Restrictions Precautions Precautions: Fall;Sternal Precaution Comments: educated in sternal precautions Restrictions Weight Bearing Restrictions: No      Mobility Bed Mobility Overal bed mobility: Needs Assistance Bed Mobility: Sidelying to Sit;Rolling;Sit to Sidelying Rolling: Mod assist Sidelying to sit: Mod assist   Sit to supine: Max assist;+2 for physical assistance Sit to sidelying: Mod assist General bed mobility comments: cues for technique and to adhere to sternal precautions  Transfers Overall transfer level: Needs assistance Equipment used: Rolling walker (2 wheeled) Transfers: Sit to/from Stand Sit to Stand: Min assist         General transfer comment: cues for hands on knees, steadying assist    Balance Overall balance assessment: Needs assistance Sitting-balance support: No upper extremity supported;Feet supported Sitting balance-Leahy Scale: Fair     Standing balance support: Bilateral upper extremity supported;During functional activity Standing  balance-Leahy Scale: Poor Standing balance comment: relies on UE support                           ADL either performed or assessed with clinical judgement   ADL Overall ADL's : Needs assistance/impaired Eating/Feeding: Independent;Bed level   Grooming: Wash/dry hands;Wash/dry face;Sitting;Set up   Upper Body Bathing: Minimal assistance;Sitting   Lower Body Bathing: Minimal assistance;Sit to/from stand   Upper Body Dressing : Moderate assistance;Sitting   Lower Body Dressing: Minimal assistance;Sit to/from stand       Toileting- Architect and Hygiene: Maximal assistance;Sit to/from stand       Functional mobility during ADLs: Minimal assistance;+2 for physical assistance       Vision Baseline Vision/History: Wears glasses Patient Visual Report: No change from baseline       Perception     Praxis      Pertinent Vitals/Pain Pain Assessment: Faces Pain Score: 10-Worst pain ever Faces Pain Scale: Hurts whole lot Pain Location: sternum Pain Descriptors / Indicators: Aching;Guarding;Grimacing Pain Intervention(s): Monitored during session;Repositioned     Hand Dominance Right   Extremity/Trunk Assessment Upper Extremity Assessment Upper Extremity Assessment: Overall WFL for tasks assessed   Lower Extremity Assessment Lower Extremity Assessment: Defer to PT evaluation   Cervical / Trunk Assessment Cervical / Trunk Assessment: Kyphotic   Communication Communication Communication: No difficulties   Cognition Arousal/Alertness: Awake/alert Behavior During Therapy: WFL for tasks assessed/performed Overall Cognitive Status: Within Functional Limits for tasks assessed                                     General  Comments  Pt educated on LVAD equipment and observed today.      Exercises     Shoulder Instructions      Home Living Family/patient expects to be discharged to:: Private residence Living Arrangements:  Spouse/significant other(girlfriend) Available Help at Discharge: Family;Friend(s);Available 24 hours/day   Home Access: Stairs to enter [employer] of Steps: 10 Entrance Stairs-Rails: Right;Left;Can reach both Home Layout: One level     Bathroom Shower/Tub: Tub/shower unit;Walk-in shower   Bathroom Toilet: Standard     Home Equipment: None          Prior Functioning/Environment Level of Independence: Independent        Comments: steps were difficult, 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        OT Problem List: Decreased strength;Decreased activity tolerance;Impaired balance (sitting and/or standing);Decreased knowledge of use of DME or AE;Pain;Cardiopulmonary status limiting activity      OT Treatment/Interventions: Self-care/ADL training;DME and/or AE instruction;Therapeutic activities;Patient/family education;Balance training    OT Goals(Current goals can be found in the care plan section) Acute Rehab OT Goals Patient Stated Goal: to go home OT Goal Formulation: With patient Time For Goal Achievement: 02/12/18 Potential to Achieve Goals: Good  OT Frequency: Min 2X/week   Barriers to D/C:            Co-evaluation              AM-PAC PT "6 Clicks" Daily Activity     Outcome Measure Help from another person eating meals?: None Help from another person taking care of personal grooming?: A Little Help from another person toileting, which includes using toliet, bedpan, or urinal?: A Lot Help from another person bathing (including washing, rinsing, drying)?: A Little Help from another person to put on and taking off regular upper body clothing?: A Lot Help from another person to put on and taking off regular lower body clothing?: A Little 6 Click Score: 17   End of Session Equipment Utilized During Treatment: Gait belt;Rolling walker  Activity Tolerance: Patient tolerated treatment well Patient left: in bed;with call bell/phone  within reach  OT Visit Diagnosis: Unsteadiness on feet (R26.81);Other abnormalities of gait and mobility (R26.89);Pain;Muscle weakness (generalized) (M62.81)                Time: 1355-1411 OT Time Calculation (min): 16 min Charges:  OT General Charges $OT Visit: 1 Visit OT Evaluation $OT Eval High Complexity: 1 High  Martie Round, OTR/L Acute Rehabilitation Services Pager: [phone] Office: [phone]  Evern Bio 01/29/2018, 2:34 PM

## 2018-01-29 NOTE — Progress Notes (Signed)
Patient having runs of v-tach with rates in 130s. Pt asymptomatic, O2 sats mid 90s, CVP 14, Flow on VAD stable with no PI events. New orders per Dr Donata Clay.

## 2018-01-29 NOTE — Progress Notes (Signed)
Spoke to primary RN regarding PICC insertion,PICC will be done tomorrow since patient have fever last night as per RN.

## 2018-01-29 NOTE — Progress Notes (Signed)
LVAD Coordinator Rounding Note:  Admitted 01/18/18 due to fever and chills. Started on broad spectrum antibiotics. Teeth extractions done by Dr. Kristin Bruins 01/20/18. RHC done 01/24/18.   HM III LVAD implanted on 01/27/18 by Dr. Donata Clay under Destination Therapy criteria.  Sitting up in chair at bedside. Pt says he had "rough" day yesterday with pain, but is feeling better this am.   Vital signs: Temp: 101  HR: 91 AV-paced Art line BP:  103/78 (89) Doppler: 106 O2 Sat: 98% on 4 l/[place] Wt: 137.5>158.7>149.9 lbs   LVAD interrogation reveals:  Speed: 5600 Flow: 4.7 Power: 4.2w PI: 3.6 Alarms: none Events:  none Hematocrit: 28  Fixed speed: 5600 Low speed limit: 5300   Drive Line:  Left abdominal dressing dry and intact; anchor intact and accurately applied.  Dressing to be changed daily by VAD nurse, nurse champion, or trained caregiver.    Labs:  LDH trend: 282>324   INR trend: 1.26>1.33>1.31  WBC trend: 8.6>15.6>16.3  Anticoagulation Plan: -INR Goal:  2.0 - 2.5 -ASA Dose: ASA 324mg  daily until INR therapeutic  Blood Products:  -Intraop: 4 FFP; 450 cell saver   - Post op:  01/27/18>>1 PRBC  Device: - Medtronic ICD - Pacing: DDD 60 - Therapies: off currently  Drips: Milrinone 0.23mcg/kg/min   Arrythmias:   Respiratory: extubated 01/28/18   Infection: Blood/urine/sputum cultures ordered due to fever 101.3.  - 01/28/18 BC's>> NTD -  01/28/18 urine cultures>>NTD  Adverse Events on VAD: -   VAD Education:  1. Demonstrated dressing change to patient today.    Plan/Recommendations:  1. Daily dressing changes per VAD RN, nurse champion, or trained caregiver.  2. Call VAD pager with any equipment or drive line questions, or needs.    Hessie Diener RN VAD Coordinator  Office: [phone]  24/7 Pager: [phone]

## 2018-01-29 NOTE — Progress Notes (Addendum)
Patient ID: Carl Ferguson, male   DOB: [birth year], 52 y.o.   MRN: [id number]     Advanced Heart Failure Rounding Note  PCP-Cardiologist: Jenean Lindau, MD   Subjective:    Events 10/11 RHC - Stable on milrinone 0.25 10/14 HM3 implantation; 2 units FFP. CTs and pressors in place out of OR.  10/14 pm. Speed reduced to 5600 after large UOP and many PI events.  10/15 spiked fever up to 103.5. Started on vanc/unasyn. Cxs sent.  Coox 61.5% this am on milrinone 0.25.   Tmax 103.5. Cultures pending. ID has seen, started on Vanc/Unasyn. Luiz Blare out. Introducer remains.   CVP 11-12. WBC 16.3  Feeling OK this am. Extubated. Pain better today, well controlled. Up in chair. Denies SOB.   LVAD Interrogation HM 3: Speed: 5600 Flow: 4.7 PI: 3.6 Power: 4.0. No PI events since 01/27/18  Objective:   Weight Range: 68 kg Body mass index is 23.48 kg/m.   Vital Signs:   Temp:  [98.4 F (36.9 C)-103.5 F (39.7 C)] 98.4 F (36.9 C) (10/16 0400) Pulse Rate:  [58-116] 92 (10/16 0700) Resp:  [9-36] 27 (10/16 0600) BP: (80-127)/(51-112) 99/79 (10/15 2318) SpO2:  [94 %-100 %] 99 % (10/16 0700) Arterial Line BP: (77-122)/(62-97) 99/78 (10/16 0700) FiO2 (%):  [40 %] 40 % (10/15 1124) Weight:  [68 kg] 68 kg (10/16 0500) Last BM Date: 01/25/18  Weight change: Filed Weights   01/27/18 0300 01/28/18 0500 01/29/18 0500  Weight: 62.4 kg 72 kg 68 kg   Intake/Output:   Intake/Output Summary (Last 24 hours) at 01/29/2018 0707 Last data filed at 01/29/2018 0658 Gross per 24 hour  Intake 2543.69 ml  Output 2735 ml  Net -191.31 ml    Physical Exam    General: Seated in chair. NAD.  HEENT: Normal. Neck: Supple, JVP ~11-12 cm. Carotids OK.  Cardiac:  Mechanical heart sounds with LVAD hum present.  Lungs:  CTAB, normal effort.  Abdomen:  NT, ND, no HSM. No bruits or masses. +BS  LVAD exit site: Dressing dry and intact. No erythema or drainage. Stabilization device present and accurately  applied. Driveline dressing changed daily per sterile technique. Extremities:  Warm and dry. No cyanosis, clubbing, or rash. Trace ankle edema.  Neuro:  Alert & oriented x 3. Cranial nerves grossly intact. Moves all 4 extremities w/o difficulty. Affect pleasant     Telemetry   V paced 80-90s, personally reviewed.  Labs    CBC Recent Labs    01/28/18 0542  01/28/18 1714 01/28/18 1743 01/29/18 0405  WBC 8.6  --  15.6*  --  16.3*  NEUTROABS 6.9  --   --   --  14.9*  HGB 9.6*   < > 10.6* 10.2* 9.8*  HCT 28.3*   < > 30.4* 30.0* 27.6*  MCV 92.5  --  91.0  --  89.6  PLT 124*  --  144*  --  128*   < > = values in this interval not displayed.   Basic Metabolic Panel Recent Labs    01/28/18 0542  01/28/18 1714 01/28/18 1743 01/29/18 0405  NA 135   < > 131* 131* 132*  K 4.4   < > 3.8 3.9 3.7  CL 100   < > 99 96* 97*  CO2 25  --  22  --  25  GLUCOSE 133*   < > 202* 208* 152*  BUN 14   < > _0 CREATININE 0.98   < >  1.12 0.90 0.91  CALCIUM 9.0  --  9.0  --  9.4  MG 2.7*  --  2.2  --  2.0  PHOS 3.6  --   --   --  3.2   < > = values in this interval not displayed.   Liver Function Tests Recent Labs    01/28/18 0542 01/29/18 0405  AST 84* 69*  ALT 16 17  ALKPHOS 52 58  BILITOT 2.6* 1.8*  PROT 5.9* 6.4*  ALBUMIN 3.6 3.5   No results for input(s): LIPASE, AMYLASE in the last 72 hours. Cardiac Enzymes No results for input(s): CKTOTAL, CKMB, CKMBINDEX, TROPONINI in the last 72 hours.  BNP: BNP (last 3 results) Recent Labs    07/02/17 1531 12/30/17 0858 01/28/18 0542  BNP 532.9* 4,087.0* 235.0*    ProBNP (last 3 results) No results for input(s): PROBNP in the last 8760 hours.   D-Dimer Recent Labs    01/27/18 1339  DDIMER 2.35*   Hemoglobin A1C No results for input(s): HGBA1C in the last 72 hours. Fasting Lipid Panel No results for input(s): CHOL, HDL, LDLCALC, TRIG, CHOLHDL, LDLDIRECT in the last 72 hours. Thyroid Function Tests No results for  input(s): TSH, T4TOTAL, T3FREE, THYROIDAB in the last 72 hours.  Invalid input(s): FREET3  Other results:   Imaging    [place] Ekg Site Rite  Result Date: 01/28/2018 If Site Rite image not attached, placement could not be confirmed due to current cardiac rhythm.    Medications:     Scheduled Medications: . acetaminophen  1,000 mg Oral Q6H   Or  . acetaminophen (TYLENOL) oral liquid 160 mg/5 mL  1,000 mg Per Tube Q6H  . amiodarone  200 mg Oral BID  . aspirin EC  325 mg Oral Daily   Or  . aspirin  324 mg Per Tube Daily   Or  . aspirin  300 mg Rectal Daily  . bisacodyl  10 mg Oral Daily   Or  . bisacodyl  10 mg Rectal Daily  . chlorhexidine gluconate (MEDLINE KIT)  15 mL Mouth Rinse BID  . docusate sodium  200 mg Oral Daily  . furosemide  40 mg Intravenous BID  . hydrALAZINE  10 mg Oral Q8H  . insulin aspart  0-24 Units Subcutaneous Q4H  . mouth rinse  15 mL Mouth Rinse QID  . metoCLOPramide (REGLAN) injection  10 mg Intravenous Q6H  . pantoprazole  40 mg Oral Daily  . sodium chloride flush  3 mL Intravenous Q12H  . warfarin  2.5 mg Oral q1800  . Warfarin - Physician Dosing Inpatient   Does not apply q1800    Infusions: . sodium chloride Stopped (01/28/18 1600)  . sodium chloride Stopped (01/28/18 0600)  . sodium chloride 10 mL/hr at 01/27/18 1400  . ampicillin-sulbactam (UNASYN) IV Stopped (01/29/18 0604)  . lactated ringers Stopped (01/28/18 0800)  . lactated ringers 20 mL/hr at 01/29/18 0658  . milrinone 0.25 mcg/kg/min (01/29/18 0658)  . nitroGLYCERIN Stopped (01/29/18 0233)  . potassium chloride    . vancomycin Stopped (01/28/18 2057)    PRN Medications: sodium chloride, hydrALAZINE, morphine injection, ondansetron (ZOFRAN) IV, oxyCODONE, sodium chloride flush, traMADol  Assessment/Plan   1. Chronic Systolic CHF: S/p HM3 75/64/33.  Nonischemic cardiomyopathy, diagnosed in 2011, initially followed at [facility].  LHC (3/19) with no significant CAD.  CPX  (3/19) submaximal, but suggestive of mild to moderate HF limitation. HIV negative 3/19, history of drug/ETOH abuse in the past, no drugs now and  had cut back ETOH considerably.  Echo 9/19 showed EF 20% with severe LV dilation, severe MR.  NYHA class IV symptoms. He has a wide LBBB.  RHC was done in 9/19 showing low output, CI 1.4.  He was admitted to start milrinone, unable to wean off milrinone and remained on 0.25 mcg/kg/min at home.  Medtronic ICD was placed, he has a His bundle lead but EP was unable to get an LV lead in place => His bundle pacing does not narrow his QRS and did not help [place] wean him off milrinone.  He did well on milrinone 0.25 mcg/kg/min, NYHA class II.  RHC 10/11 shows filling pressures and cardiac output to be fairly well optimized. HM3 placed 10/14.  - Coox 61% this am on milrinone 0.25 mcg/kg/min.  - Volume status elevated with CVP 11-12.  - Starting on lasix gtt at 10 mg/hr per Dr. Prescott Gum.  - Extubated 01/28/18 - Continue ASA 325 until INR > 1.8, to start warfarin.  2. ID: Treated with Augmentin for dental infection pre-op.   - Tmax POD#1 103.5. Started on Vanc/Unasyn.  - ID following.  - Cultures pending. 3. ETOH Abuse:  - No longer drinking. No change.  4. Tobacco abuse: He has quit smoking since admission in 9/19.  - Continue to encouraged complete cessation.  5. Mitral regurgitation: Severe functional MR.  With LV dilated > 7 cm, suspect Mitraclip would be unlikely to help him appreciably.   - Decreased MR s/p LVAD placement.   6. VT: Patient had episode of VT requiring ATP.   - Continue amiodarone gtt for now. Watch for post op Afib.  - Keep K> 4.0 Mg > 2.0.  K 3.7 today. Mg 2.0.  - Quiescent thus far.   Shirley Friar, PA-C  01/29/2018 7:07 AM   VAD Team Pager [phone] (7am - 7am) ++VAD ISSUES ONLY++   Advanced Heart Failure Team Pager [phone] (M-F; [place])  Please contact [facility] for night-coverage after hours (4p -7a ) and  weekends on [email or web address]  Patient seen with PA, agree with the above note.   Co-ox 61.5% with CVP 11-12.  Stable LVAD parameters.  He is off pressors and remains on milrinone 0.25.  Fever up to 103 yesterday.  Swan removed.  Afebrile this morning, started on vancomycin/Unasyn.   On exam, sitting in chair.  JVP 10 cm, normal LVAD sounds.  No edema.  Decreased BS at bases bilaterally.    Decrease milrinone to 0.125.  Lasix gtt started with volume overload (elevated CVP, wet CXR).    He is now on vancomycin/Unasyn with post-op fever yesterday.  Afebrile this morning, WBCs stable. Cultures sent.   Warfarin per Dr. Prescott Gum, will remain on ASA 325 until INR > 1.8.   CRITICAL CARE Performed by: Loralie Champagne  Total critical care time: 35 minutes  Critical care time was exclusive of separately billable procedures and treating other patients.  Critical care was necessary to treat or prevent imminent or life-threatening deterioration.  Critical care was time spent personally by me on the following activities: development of treatment plan with patient and/or surrogate as well as nursing, discussions with consultants, evaluation of patient's response to treatment, examination of patient, obtaining history from patient or surrogate, ordering and performing treatments and interventions, ordering and review of laboratory studies, ordering and review of radiographic studies, pulse oximetry and re-evaluation of patient's condition.  Loralie Champagne 01/29/2018 9:00 AM

## 2018-01-30 ENCOUNTER — Inpatient Hospital Stay (HOSPITAL_COMMUNITY): Payer: Medicaid Other

## 2018-01-30 ENCOUNTER — Inpatient Hospital Stay: Payer: Self-pay

## 2018-01-30 DIAGNOSIS — D72829 Elevated white blood cell count, unspecified: Secondary | ICD-10-CM

## 2018-01-30 LAB — COMPREHENSIVE METABOLIC PANEL
ALT: 14 U/L (ref 0–44)
AST: 35 U/L (ref 15–41)
Albumin: 2.9 g/dL — ABNORMAL LOW (ref 3.5–5.0)
Alkaline Phosphatase: 54 U/L (ref 38–126)
Anion gap: 12 (ref 5–15)
BUN: 9 mg/dL (ref 6–20)
CO2: 24 mmol/L (ref 22–32)
Calcium: 8.7 mg/dL — ABNORMAL LOW (ref 8.9–10.3)
Chloride: 94 mmol/L — ABNORMAL LOW (ref 98–111)
Creatinine, Ser: 0.73 mg/dL (ref 0.61–1.24)
GFR calc Af Amer: >60 mL/min (ref >60)
GFR calc non Af Amer: >60 mL/min (ref >60)
Glucose, Bld: 191 mg/dL — ABNORMAL HIGH (ref 70–99)
Potassium: 3.4 mmol/L — ABNORMAL LOW (ref 3.5–5.1)
Sodium: 130 mmol/L — ABNORMAL LOW (ref 135–145)
Total Bilirubin: 0.8 mg/dL (ref 0.3–1.2)
Total Protein: 5.7 g/dL — ABNORMAL LOW (ref 6.5–8.1)

## 2018-01-30 LAB — MAGNESIUM: Magnesium: 1.8 mg/dL (ref 1.7–2.4)

## 2018-01-30 LAB — CBC WITH DIFFERENTIAL/PLATELET
Abs Immature Granulocytes: 0.07 10*3/uL (ref 0.00–0.07)
Basophils Absolute: 0 10*3/uL (ref 0.0–0.1)
Basophils Relative: 0 %
Eosinophils Absolute: 0.1 10*3/uL (ref 0.0–0.5)
Eosinophils Relative: 1 %
HCT: 29.3 % — ABNORMAL LOW (ref 39.0–52.0)
Hemoglobin: 10.1 g/dL — ABNORMAL LOW (ref 13.0–17.0)
Immature Granulocytes: 1 %
Lymphocytes Relative: 9 %
Lymphs Abs: 1.3 10*3/uL (ref 0.7–4.0)
MCH: 31.3 pg (ref 26.0–34.0)
MCHC: 34.5 g/dL (ref 30.0–36.0)
MCV: 90.7 fL (ref 80.0–100.0)
Monocytes Absolute: 1.3 10*3/uL — ABNORMAL HIGH (ref 0.1–1.0)
Monocytes Relative: 9 %
Neutro Abs: 11.7 10*3/uL — ABNORMAL HIGH (ref 1.7–7.7)
Neutrophils Relative %: 80 %
Platelets: 156 10*3/uL (ref 150–400)
RBC: 3.23 MIL/uL — ABNORMAL LOW (ref 4.22–5.81)
RDW: 13.7 % (ref 11.5–15.5)
WBC: 14.5 10*3/uL — ABNORMAL HIGH (ref 4.0–10.5)
nRBC: 0 % (ref 0.0–0.2)

## 2018-01-30 LAB — GLUCOSE, CAPILLARY
GLUCOSE-CAPILLARY: 121 mg/dL — AB (ref 70–99)
GLUCOSE-CAPILLARY: 137 mg/dL — AB (ref 70–99)
GLUCOSE-CAPILLARY: 98 mg/dL (ref 70–99)
Glucose-Capillary: 170 mg/dL — ABNORMAL HIGH (ref 70–99)

## 2018-01-30 LAB — POCT I-STAT 3, ART BLOOD GAS (G3+)
Acid-Base Excess: 4 mmol/L — ABNORMAL HIGH (ref 0.0–2.0)
BICARBONATE: 26.6 mmol/L (ref 20.0–28.0)
O2 Saturation: 95 %
PCO2 ART: 31.4 mmHg — AB (ref 32.0–48.0)
PO2 ART: 65 mmHg — AB (ref 83.0–108.0)
Patient temperature: 37.1
TCO2: 27 mmol/L (ref 22–32)
pH, Arterial: 7.535 — ABNORMAL HIGH (ref 7.350–7.450)

## 2018-01-30 LAB — COOXEMETRY PANEL
Carboxyhemoglobin: 1.9 % — ABNORMAL HIGH (ref 0.5–1.5)
METHEMOGLOBIN: 1.1 % (ref 0.0–1.5)
O2 Saturation: 75.2 %
TOTAL HEMOGLOBIN: 10.4 g/dL — AB (ref 12.0–16.0)

## 2018-01-30 LAB — POCT I-STAT, CHEM 8
BUN: 8 mg/dL (ref 6–20)
CREATININE: 0.7 mg/dL (ref 0.61–1.24)
Calcium, Ion: 1.18 mmol/L (ref 1.15–1.40)
Chloride: 91 mmol/L — ABNORMAL LOW (ref 98–111)
GLUCOSE: 234 mg/dL — AB (ref 70–99)
HEMATOCRIT: 29 % — AB (ref 39.0–52.0)
HEMOGLOBIN: 9.9 g/dL — AB (ref 13.0–17.0)
Potassium: 3 mmol/L — ABNORMAL LOW (ref 3.5–5.1)
Sodium: 127 mmol/L — ABNORMAL LOW (ref 135–145)
TCO2: 30 mmol/L (ref 22–32)

## 2018-01-30 LAB — BASIC METABOLIC PANEL
ANION GAP: 11 (ref 5–15)
BUN: 9 mg/dL (ref 6–20)
CALCIUM: 8.6 mg/dL — AB (ref 8.9–10.3)
CO2: 28 mmol/L (ref 22–32)
CREATININE: 0.79 mg/dL (ref 0.61–1.24)
Chloride: 93 mmol/L — ABNORMAL LOW (ref 98–111)
GFR calc non Af Amer: >60 mL/min (ref >60)
Glucose, Bld: 193 mg/dL — ABNORMAL HIGH (ref 70–99)
Potassium: 3.2 mmol/L — ABNORMAL LOW (ref 3.5–5.1)
SODIUM: 132 mmol/L — AB (ref 135–145)

## 2018-01-30 LAB — LACTATE DEHYDROGENASE: LDH: 263 U/L — AB (ref 98–192)

## 2018-01-30 LAB — PROTIME-INR
INR: 1.3
Prothrombin Time: 16.1 seconds — ABNORMAL HIGH (ref 11.4–15.2)

## 2018-01-30 LAB — APTT: aPTT: 55 seconds — ABNORMAL HIGH (ref 24–36)

## 2018-01-30 LAB — HEPARIN LEVEL (UNFRACTIONATED): Heparin Unfractionated: <0.1 IU/mL — ABNORMAL LOW (ref 0.30–0.70)

## 2018-01-30 LAB — PHOSPHORUS: Phosphorus: 2.2 mg/dL — ABNORMAL LOW (ref 2.5–4.6)

## 2018-01-30 MED ORDER — POTASSIUM CHLORIDE 10 MEQ/50ML IV SOLN
10.0000 meq | INTRAVENOUS | Status: DC
  Administered 2018-01-30: 10 meq via INTRAVENOUS
  Filled 2018-01-30 (×3): qty 50

## 2018-01-30 MED ORDER — MORPHINE SULFATE (PF) 2 MG/ML IV SOLN
2.0000 mg | INTRAVENOUS | Status: DC | PRN
  Administered 2018-01-30: 4 mg via INTRAVENOUS
  Filled 2018-01-30: qty 2

## 2018-01-30 MED ORDER — HEPARIN (PORCINE) IN NACL 100-0.45 UNIT/ML-% IJ SOLN
800.0000 [IU]/h | INTRAMUSCULAR | Status: DC
  Administered 2018-01-30: 600 [IU]/h via INTRAVENOUS
  Filled 2018-01-30: qty 250

## 2018-01-30 MED ORDER — MAGNESIUM SULFATE 2 GM/50ML IV SOLN
2.0000 g | Freq: Once | INTRAVENOUS | Status: AC
  Administered 2018-01-30: 2 g via INTRAVENOUS
  Filled 2018-01-30: qty 50

## 2018-01-30 MED ORDER — SODIUM CHLORIDE 0.9% FLUSH: 10.0000 mL | INTRAVENOUS | Status: DC | PRN

## 2018-01-30 MED ORDER — GABAPENTIN 600 MG PO TABS
300.0000 mg | ORAL_TABLET | Freq: Two times a day (BID) | ORAL | Status: DC
  Administered 2018-01-30 – 2018-02-02 (×8): 300 mg via ORAL
  Filled 2018-01-30 (×8): qty 1

## 2018-01-30 MED ORDER — ALBUMIN HUMAN 25 % IV SOLN
12.5000 g | Freq: Four times a day (QID) | INTRAVENOUS | Status: AC
  Administered 2018-01-30 – 2018-01-31 (×3): 12.5 g via INTRAVENOUS
  Filled 2018-01-30 (×4): qty 50

## 2018-01-30 MED ORDER — CHLORHEXIDINE GLUCONATE CLOTH 2 % EX PADS
6.0000 | MEDICATED_PAD | Freq: Every day | CUTANEOUS | Status: DC
  Administered 2018-01-31 – 2018-02-10 (×10): 6 via TOPICAL

## 2018-01-30 MED ORDER — AMIODARONE HCL IN DEXTROSE 360-4.14 MG/200ML-% IV SOLN
30.0000 mg/h | INTRAVENOUS | Status: DC
  Administered 2018-01-30 – 2018-01-31 (×2): 30 mg/h via INTRAVENOUS
  Filled 2018-01-30 (×2): qty 200

## 2018-01-30 MED ORDER — POTASSIUM CHLORIDE CRYS ER 20 MEQ PO TBCR
20.0000 meq | EXTENDED_RELEASE_TABLET | ORAL | Status: AC | PRN
  Administered 2018-01-30 – 2018-01-31 (×3): 20 meq via ORAL
  Filled 2018-01-30 (×3): qty 1

## 2018-01-30 MED ORDER — WARFARIN SODIUM 5 MG PO TABS
5.0000 mg | ORAL_TABLET | Freq: Every day | ORAL | Status: DC
  Administered 2018-01-30: 5 mg via ORAL
  Filled 2018-01-30: qty 1

## 2018-01-30 MED ORDER — ASPIRIN EC 81 MG PO TBEC
81.0000 mg | DELAYED_RELEASE_TABLET | Freq: Every day | ORAL | Status: DC
  Administered 2018-01-31 – 2018-02-10 (×11): 81 mg via ORAL
  Filled 2018-01-30 (×11): qty 1

## 2018-01-30 MED ORDER — POTASSIUM CHLORIDE 20 MEQ PO PACK
20.0000 meq | PACK | Freq: Every day | ORAL | Status: DC
  Administered 2018-01-30: 20 meq via ORAL
  Filled 2018-01-30: qty 1

## 2018-01-30 MED ORDER — AMIODARONE HCL IN DEXTROSE 360-4.14 MG/200ML-% IV SOLN
60.0000 mg/h | INTRAVENOUS | Status: AC
  Administered 2018-01-30: 60 mg/h via INTRAVENOUS
  Filled 2018-01-30: qty 200

## 2018-01-30 MED ORDER — SODIUM CHLORIDE 0.9% FLUSH
10.0000 mL | Freq: Two times a day (BID) | INTRAVENOUS | Status: DC
  Administered 2018-01-30 – 2018-02-01 (×4): 10 mL
  Administered 2018-02-02 (×2): 20 mL
  Administered 2018-02-03 – 2018-02-07 (×8): 10 mL
  Administered 2018-02-08: 20 mL
  Administered 2018-02-08 – 2018-02-09 (×3): 10 mL

## 2018-01-30 MED ORDER — AMIODARONE HCL IN DEXTROSE 360-4.14 MG/200ML-% IV SOLN: 60.0000 mg/h | INTRAVENOUS | Status: DC

## 2018-01-30 MED FILL — Mannitol IV Soln 20%: INTRAVENOUS | Qty: 500 | Status: AC

## 2018-01-30 MED FILL — Sodium Bicarbonate IV Soln 8.4%: INTRAVENOUS | Qty: 50 | Status: AC

## 2018-01-30 MED FILL — Sodium Chloride IV Soln 0.9%: INTRAVENOUS | Qty: 2000 | Status: AC

## 2018-01-30 MED FILL — Heparin Sodium (Porcine) Inj 1000 Unit/ML: INTRAMUSCULAR | Qty: 30 | Status: AC

## 2018-01-30 MED FILL — Heparin Sodium (Porcine) Inj 1000 Unit/ML: INTRAMUSCULAR | Qty: 10 | Status: AC

## 2018-01-30 MED FILL — Electrolyte-R (PH 7.4) Solution: INTRAVENOUS | Qty: 3000 | Status: AC

## 2018-01-30 NOTE — Progress Notes (Signed)
ANTICOAGULATION CONSULT NOTE - Initial Consult  Pharmacy Consult for heparin Indication: LVAD  Allergies  Allergen Reactions  . Norflex [Orphenadrine] Swelling    FACE [ANGIOEDEMA]    Patient Measurements: Height: 5\' 7"  (170.2 cm) Weight: 147 lb 4.3 oz (66.8 kg) IBW/kg (Calculated) : 66.1 Heparin Dosing Weight: 68kg  Vital Signs: Temp: 98.2 F (36.8 C) (10/17 1613) Temp Source: Oral (10/17 1613) BP: 99/88 (10/17 1500) Pulse Rate: 60 (10/17 1500)  Labs: Recent Labs    01/28/18 0634 01/28/18 0639 01/28/18 1714  01/29/18 0405 01/29/18 1515 01/30/18 0352 01/30/18 1300  HGB  --   --  10.6*   < > 9.8* 9.5* 10.1*  --   HCT  --   --  30.4*   < > 27.6* 28.0* 29.3*  --   PLT  --   --  144*  --  128*  --  156  --   LABPROT 16.3*  --   --   --  16.1*  --  16.1*  --   INR 1.33  --   --   --  1.31  --  1.30  --   HEPARINUNFRC  --  <0.10*  --   --   --   --   --   --   CREATININE  --   --  1.12   < > 0.91 0.60* 0.73 0.79   < > = values in this interval not displayed.    Estimated Creatinine Clearance: 102.1 mL/min (by C-G formula based on SCr of 0.79 mg/dL).   Medical History: Past Medical History:  Diagnosis Date  . AICD (automatic cardioverter/defibrillator) present   . CHF (congestive heart failure) (HCC)   . COPD (chronic obstructive pulmonary disease) (HCC)   . Depression   . Dyspnea   . GERD (gastroesophageal reflux disease)   . Nonischemic cardiomyopathy (HCC)    a. diagnosed in 2011 with EF 30% at that time b. EF improved to 50% by echo in 02/2016  . Pneumonia   . Tachycardia      Assessment: 52 yoM now s/p HM3 on 10/14 started on IV heparin this morning while INR subtherapeutic. Pharmacy consulted to manage and keep infusion rate no higher than 1000 units/hr and keep heparin level 0.2-0.3.  Initial heparin level undetectable, aPTT noted to be 55 seconds. Will increase heparin conservatively.  Goal of Therapy:  Heparin level 0.2-0.3 units/ml Monitor  platelets by anticoagulation protocol: Yes   Plan:  -Increase heparin to 700 units/hr -Recheck 6hr heparin level  Fredonia Highland, PharmD, BCPS Clinical Pharmacist [phone] Please check AMION for all [facility] Pharmacy numbers 01/30/2018

## 2018-01-30 NOTE — Progress Notes (Signed)
[facility]    Date of Admission:  01/18/2018   Total days of antibiotics 4           ID: Carl Ferguson is a 52 y.o. male with  LVAD post op fever Principal Problem:   Fever Active Problems:   Nonischemic cardiomyopathy (HCC)   ICD (implantable cardioverter-defibrillator) in place   Diarrhea   Flu-like symptoms    Subjective: Feeling better but having some discomfort at chest tube site  Medications:  . acetaminophen  1,000 mg Oral Q6H   Or  . acetaminophen (TYLENOL) oral liquid 160 mg/5 mL  1,000 mg Per Tube Q6H  . [START ON 01/31/2018] aspirin EC  81 mg Oral Daily  . bisacodyl  10 mg Oral Daily   Or  . bisacodyl  10 mg Rectal Daily  . Chlorhexidine Gluconate Cloth  6 each Topical Daily  . docusate sodium  200 mg Oral Daily  . gabapentin  300 mg Oral BID  . insulin aspart  0-24 Units Subcutaneous TID AC & HS  . lactose free nutrition  237 mL Oral TID WC  . mouth rinse  15 mL Mouth Rinse BID  . metoCLOPramide (REGLAN) injection  10 mg Intravenous Q6H  . pantoprazole  40 mg Oral Daily  . potassium chloride  20 mEq Oral Daily  . sodium chloride flush  10-40 mL Intracatheter Q12H  . sodium chloride flush  3 mL Intravenous Q12H  . warfarin  5 mg Oral q1800  . Warfarin - Physician Dosing Inpatient   Does not apply q1800    Objective: Vital signs in last 24 hours: Temp:  [97.1 F (36.2 C)-99 F (37.2 C)] 98.2 F (36.8 C) (10/17 1613) Pulse Rate:  [49-101] 60 (10/17 1500) Resp:  [18-33] 23 (10/17 1500) BP: (76-132)/(45-102) 99/88 (10/17 1500) SpO2:  [93 %-100 %] 99 % (10/17 1500) Weight:  [66.8 kg] 66.8 kg (10/17 0500) Physical Exam  Constitutional: He is oriented to person, place, and time. He appears well-developed and well-nourished. No distress.  HENT: gums healing well Mouth/Throat: Oropharynx is clear and moist. No oropharyngeal exudate.  Cardiovascular: generate hum,  Incision covered Pulmonary/Chest: Effort normal and breath  sounds normal. No respiratory distress. He has no wheezes. Chest tube -serosanginous fluid Abdominal: Soft. Bowel sounds are normal. He exhibits no distension. Drive line bandaged appropriately Neurological: He is alert and oriented to person, place, and time.  Skin: Skin is warm and dry. No rash noted. No erythema.  Psychiatric: He has a normal mood and affect. His behavior is normal.    Lab Results Recent Labs    01/29/18 0405  01/30/18 0352 01/30/18 1300 01/30/18 1747  WBC 16.3*  --  14.5*  --   --   HGB 9.8*   < > 10.1*  --  9.9*  HCT 27.6*   < > 29.3*  --  29.0*  NA 132*   < > 130* 132* 127*  K 3.7   < > 3.4* 3.2* 3.0*  CL 97*   < > 94* 93* 91*  CO2 25  --  24 28  --   BUN 11   < > 9 9 8   CREATININE 0.91   < > 0.73 0.79 0.70   < > = values in this interval not displayed.   Liver Panel Recent Labs    01/29/18 0405 01/30/18 0352  PROT 6.4* 5.7*  ALBUMIN 3.5 2.9*  AST 69* 35  ALT 17 14  ALKPHOS 58 54  BILITOT 1.8* 0.8    Microbiology: Blood cx NGTD at 48hr Studies/Results: Dg Chest Port 1 View  Result Date: 01/30/2018 CLINICAL DATA:  52 year old male postoperative day 3 LVAD. EXAM: PORTABLE CHEST 1 VIEW COMPARISON:  01/29/2018 and earlier. FINDINGS: Portable AP semi upright view at 0547 hours. Stable left chest tube and right IJ introducer sheath. Stable cardiac size and mediastinal contours. LVAD and right side AICD appears stable. Regressed bilateral pulmonary vascularity. No pneumothorax. No pleural effusion is evident. Mild retrocardiac atelectasis suspected. Negative visible bowel gas pattern. Epicardial pacer wires in place. IMPRESSION: 1. Stable postoperative changes, lines and tubes. 2. Regressed pulmonary vascularity/edema. No pneumothorax or pleural effusion. Mild atelectasis. Electronically Signed   By: Odessa Fleming M.D.   On: 01/30/2018 09:28   Dg Chest Port 1 View  Result Date: 01/29/2018 CLINICAL DATA:  Chest tube placement. EXAM: PORTABLE CHEST 1 VIEW  COMPARISON:  Radiograph of January 28, 2018. FINDINGS: Stable cardiomegaly. Stable position of right-sided pacemaker. Endotracheal and nasogastric tubes have been removed. Left ventricular assist device is unchanged in position. Stable left-sided chest tube is noted without pneumothorax. Increased central pulmonary vascular congestion and bibasilar opacities are noted concerning for worsening edema or atelectasis with probable mild right pleural effusion. Bony thorax is unremarkable. IMPRESSION: Endotracheal and nasogastric tubes have been removed. Stable position of left ventricular assist device. Left-sided chest tube is again noted without pneumothorax. Increased bibasilar opacities are noted concerning for worsening edema or atelectasis with probable mild right pleural effusion. Electronically Signed   By: Lupita Raider, M.D.   On: 01/29/2018 10:22   [place] Ekg Site Rite  Result Date: 01/30/2018 If Site Rite image not attached, placement could not be confirmed due to current cardiac rhythm.  [place] Ekg Site Rite  Result Date: 01/29/2018 If Site Rite image not attached, placement could not be confirmed due to current cardiac rhythm.   Assessment/Plan: Post op fever and leukocytosis s/p LVAD = no new sources of infection identified. Recommend one more day of abtx. If  Blood cx still negative tomorrow, then we will stop abtx. Appears to be consistent with post inflammatory process in setting of surgery  [facility] Cell: [phone] Pager: [phone]  01/30/2018, 5:58 PM

## 2018-01-30 NOTE — Progress Notes (Signed)
CSW met with patient at bedside. Patient states he walked the unit 2x earlier today. Patient spoke of the struggles with recovery but states he expected and is motivated for a quick recovery. He states that he has been able to change independently to batteries when walking with PT and was quite pleased. CSW provided support and will continue to follow throughout implant hospitalization. Raquel Sarna, [place], [place]

## 2018-01-30 NOTE — Progress Notes (Signed)
Physical Therapy Treatment Patient Details Name: Carl Ferguson MRN: [id number] DOB: 1965/09/19 Today's Date: 01/30/2018    History of Present Illness Carl Ferguson is a 52 y.o. male with systolic CHF, NICM, Chronic LBBB, Tobacco abuse, prior ETOH abuse, prior substance abuse, and h/o of device placements with extractions due to infection. Pt with ICD placed 01/03/18.  Teeth extractions 10/7.  Underwent HeartMate 3 implantation on October 14    PT Comments    Pt admitted with above diagnosis. Pt currently with functional limitations due to balance and endurance deficits. Pt was able to ambulate with Carley Hammed walker on unit with min assist of 2 for lines.  Pt progressing and tolerating mobility well.  No O2 needed today with sats >90%.   Pt will benefit from skilled PT to increase their independence and safety with mobility to allow discharge to the venue listed below.    Speed 5600 Flow 4.8 PI 3.5 Power 4.3  Follow Up Recommendations  Home health PT;Supervision/Assistance - 24 hour     Equipment Recommendations  3in1 (PT)(rollator)    Recommendations for Other Services       Precautions / Restrictions Precautions Precautions: Fall;Sternal Precaution Comments: educated in sternal precautions Restrictions Weight Bearing Restrictions: Yes(sternal precautions)    Mobility  Bed Mobility Overal bed mobility: Needs Assistance Bed Mobility: Sidelying to Sit;Rolling;Sit to Sidelying Rolling: Min assist Sidelying to sit: Min assist   Sit to supine: +2 for physical assistance;Mod assist Sit to sidelying: Mod assist;+2 for physical assistance General bed mobility comments: cues for technique and to adhere to sternal precautions.  More pain sit to sidelying than when getting up. trying to teach log roll   Transfers Overall transfer level: Needs assistance Equipment used: Rolling walker (2 wheeled) Transfers: Sit to/from Stand Sit to Stand: Min assist         General transfer  comment: cues for hands on knees, steadying assist  Ambulation/Gait Ambulation/Gait assistance: Min assist;+2 safety/equipment(3 staff -1 guard pt, 1 IV pole, and pushed chair) Gait Distance (Feet): 370 Feet Assistive device: (EVA walker) Gait Pattern/deviations: Step-through pattern;Decreased stride length;Drifts right/left;Trunk flexed   Gait velocity interpretation: <1.31 ft/sec, indicative of household ambulator General Gait Details: Overall, pt did well.  cues not to lean on Eva walker and to stand tall.  Pt needed a few cues for pursed lip breathe.  LVAD parameters stable.    Stairs             Wheelchair Mobility    Modified Rankin (Stroke Patients Only)       Balance Overall balance assessment: Needs assistance Sitting-balance support: No upper extremity supported;Feet supported Sitting balance-Leahy Scale: Fair     Standing balance support: Bilateral upper extremity supported;During functional activity Standing balance-Leahy Scale: Poor Standing balance comment: relies on UE support                            Cognition Arousal/Alertness: Awake/alert Behavior During Therapy: WFL for tasks assessed/performed Overall Cognitive Status: Within Functional Limits for tasks assessed                                        Exercises      General Comments General comments (skin integrity, edema, etc.): Pt was able to recall equipment needed with cues to get clips and back up bag.  Pt was able to  switch power to batteries with some asssist with connection.  Pt also had difficulty placing batteries in vest.  Pt then was able to switch back to power without assist or cues.        Pertinent Vitals/Pain Pain Assessment: Faces Faces Pain Scale: Hurts whole lot Pain Location: sternum Pain Descriptors / Indicators: Aching;Guarding;Grimacing Pain Intervention(s): Limited activity within patient's tolerance;Monitored during session;Premedicated  before session;Repositioned    Home Living                      Prior Function            PT Goals (current goals can now be found in the care plan section) Acute Rehab PT Goals Patient Stated Goal: to go home Progress towards PT goals: Progressing toward goals    Frequency    Min 3X/week      PT Plan Current plan remains appropriate    Co-evaluation              AM-PAC PT "6 Clicks" Daily Activity  Outcome Measure  Difficulty turning over in bed (including adjusting bedclothes, sheets and blankets)?: Unable Difficulty moving from lying on back to sitting on the side of the bed? : Unable Difficulty sitting down on and standing up from a chair with arms (e.g., wheelchair, bedside commode, etc,.)?: A Little Help needed moving to and from a bed to chair (including a wheelchair)?: A Little Help needed walking in hospital room?: A Little Help needed climbing 3-5 steps with a railing? : A Lot 6 Click Score: 13    End of Session Equipment Utilized During Treatment: Gait belt Activity Tolerance: Patient tolerated treatment well Patient left: in bed;with call bell/phone within reach;with family/visitor present Nurse Communication: Mobility status PT Visit Diagnosis: Unsteadiness on feet (R26.81);Muscle weakness (generalized) (M62.81);Pain Pain - part of body: (sternum)     Time: 9728-2060 PT Time Calculation (min) (ACUTE ONLY): 43 min  Charges:  $Gait Training: 23-37 mins $Self Care/Home Management: 8-22                     [facility] Jurnei Latini,PT Acute Rehabilitation Services Pager:  [phone]  Office:  [phone]     Berline Lopes 01/30/2018, 2:24 PM

## 2018-01-30 NOTE — Progress Notes (Addendum)
Patient ID: Carl Ferguson, male   DOB: 15-Mar-1966, 52 y.o.   MRN: [id number]     Advanced Heart Failure Rounding Note  PCP-Cardiologist: Garwin Brothers, MD   Subjective:    Events 10/11 RHC - Stable on milrinone 0.25 10/14 HM3 implantation; 2 units FFP. CTs and pressors in place out of OR.  10/14 pm. Speed reduced to 5600 after large UOP and many PI events.  10/15 spiked fever up to 103.5. Started on vanc/unasyn. Cxs sent.  Coox 75% on milrinone 0.125 mcg/kg/min.   Tmax 99 now. BCx NGDT. Remains on Vanc/Unasyn.    CVP 12. WBC 14.5. K 3.4. In for 3 runs of K.  Feeling good this am. Denies SOB or lightheadedness. VTach last night for around 5 minutes, asymptomatic. Having pocket pain requiring morphine last night.   LVAD Interrogation HM 3: Speed: 5600 Flow: 4.7 PI: 3.5 Power: 4.0. No PI events   Objective:   Weight Range: 66.8 kg Body mass index is 23.07 kg/m.   Vital Signs:   Temp:  [97.2 F (36.2 C)-99 F (37.2 C)] 99 F (37.2 C) (10/17 0700) Pulse Rate:  [49-102] 100 (10/17 0700) Resp:  [18-32] 26 (10/17 0700) BP: (89-114)/(62-97) 107/84 (10/17 0700) SpO2:  [93 %-100 %] 93 % (10/17 0700) Arterial Line BP: (100-125)/(77-99) 121/92 (10/16 1700) Weight:  [66.8 kg] 66.8 kg (10/17 0500) Last BM Date: 01/27/18  Weight change: Filed Weights   01/28/18 0500 01/29/18 0500 01/30/18 0500  Weight: 72 kg 68 kg 66.8 kg   Intake/Output:   Intake/Output Summary (Last 24 hours) at 01/30/2018 0710 Last data filed at 01/30/2018 0700 Gross per 24 hour  Intake 3582.89 ml  Output 3245 ml  Net 337.89 ml    Physical Exam    General: NAD.  HEENT: Normal. Neck: Supple, JVP ~12 cm. Carotids OK.  Cardiac:  Mechanical heart sounds with LVAD hum present.  Lungs:  CTAB, normal effort.  Abdomen:  NT, ND, no HSM. No bruits or masses. +BS  LVAD exit site: Dressing dry and intact. No erythema or drainage. Stabilization device present and accurately applied. Driveline dressing  changed daily per sterile technique. Extremities:  Warm and dry. No cyanosis, clubbing, rash, or edema.  Neuro:  Alert & oriented x 3. Cranial nerves grossly intact. Moves all 4 extremities w/o difficulty. Affect pleasant     Telemetry   V paced 80-90s. VTach ~ 130 from 1821 - 1827 01/29/18, personally reviewed.   Labs    CBC Recent Labs    01/29/18 0405 01/29/18 1515 01/30/18 0352  WBC 16.3*  --  14.5*  NEUTROABS 14.9*  --  11.7*  HGB 9.8* 9.5* 10.1*  HCT 27.6* 28.0* 29.3*  MCV 89.6  --  90.7  PLT 128*  --  156   Basic Metabolic Panel Recent Labs    95/09/32 0405 01/29/18 1515 01/30/18 0352  NA 132* 132* 130*  K 3.7 3.6 3.4*  CL 97* 93* 94*  CO2 25  --  24  GLUCOSE 152* 129* 191*  BUN 11 9 9   CREATININE 0.91 0.60* 0.73  CALCIUM 9.4  --  8.7*  MG 2.0  --  1.8  PHOS 3.2  --  2.2*   Liver Function Tests Recent Labs    01/29/18 0405 01/30/18 0352  AST 69* 35  ALT 17 14  ALKPHOS 58 54  BILITOT 1.8* 0.8  PROT 6.4* 5.7*  ALBUMIN 3.5 2.9*   No results for input(s): LIPASE, AMYLASE in the last  72 hours. Cardiac Enzymes No results for input(s): CKTOTAL, CKMB, CKMBINDEX, TROPONINI in the last 72 hours.  BNP: BNP (last 3 results) Recent Labs    07/02/17 1531 12/30/17 0858 01/28/18 0542  BNP 532.9* 4,087.0* 235.0*    ProBNP (last 3 results) No results for input(s): PROBNP in the last 8760 hours.   D-Dimer Recent Labs    01/27/18 1339  DDIMER 2.35*   Hemoglobin A1C No results for input(s): HGBA1C in the last 72 hours. Fasting Lipid Panel No results for input(s): CHOL, HDL, LDLCALC, TRIG, CHOLHDL, LDLDIRECT in the last 72 hours. Thyroid Function Tests No results for input(s): TSH, T4TOTAL, T3FREE, THYROIDAB in the last 72 hours.  Invalid input(s): FREET3  Other results:   Imaging    [place] Ekg Site Rite  Result Date: 01/29/2018 If Site Rite image not attached, placement could not be confirmed due to current cardiac  rhythm.    Medications:     Scheduled Medications: . acetaminophen  1,000 mg Oral Q6H   Or  . acetaminophen (TYLENOL) oral liquid 160 mg/5 mL  1,000 mg Per Tube Q6H  . amiodarone  200 mg Oral BID  . aspirin EC  325 mg Oral Daily   Or  . aspirin  324 mg Per Tube Daily   Or  . aspirin  300 mg Rectal Daily  . bisacodyl  10 mg Oral Daily   Or  . bisacodyl  10 mg Rectal Daily  . Chlorhexidine Gluconate Cloth  6 each Topical Daily  . docusate sodium  200 mg Oral Daily  . hydrALAZINE  10 mg Oral Q6H  . insulin aspart  0-24 Units Subcutaneous TID AC & HS  . lactose free nutrition  237 mL Oral TID WC  . mouth rinse  15 mL Mouth Rinse BID  . metoCLOPramide (REGLAN) injection  10 mg Intravenous Q6H  . pantoprazole  40 mg Oral Daily  . sodium chloride flush  10-40 mL Intracatheter Q12H  . sodium chloride flush  3 mL Intravenous Q12H  . warfarin  4 mg Oral q1800  . Warfarin - Physician Dosing Inpatient   Does not apply q1800    Infusions: . sodium chloride Stopped (01/28/18 1600)  . sodium chloride Stopped (01/28/18 0600)  . sodium chloride 10 mL/hr at 01/27/18 1400  . ampicillin-sulbactam (UNASYN) IV Stopped (01/30/18 0557)  . furosemide (LASIX) infusion 6 mg/hr (01/30/18 0700)  . lactated ringers Stopped (01/28/18 0800)  . lactated ringers 10 mL/hr at 01/30/18 0700  . milrinone 0.125 mcg/kg/min (01/30/18 0700)  . nitroGLYCERIN Stopped (01/29/18 0233)  . potassium chloride 10 mEq (01/30/18 0707)  . vancomycin Stopped (01/29/18 2120)    PRN Medications: sodium chloride, hydrALAZINE, morphine injection, ondansetron (ZOFRAN) IV, oxyCODONE, sodium chloride flush, traMADol  Assessment/Plan   1. Chronic Systolic CHF: S/p HM3 01/27/18.  Nonischemic cardiomyopathy, diagnosed in 2011, initially followed at [facility] [place].  LHC (3/19) with no significant CAD.  CPX (3/19) submaximal, but suggestive of mild to moderate HF limitation. HIV negative 3/19, history of drug/ETOH abuse in the  past, no drugs now and had cut back ETOH considerably.  Echo 9/19 showed EF 20% with severe LV dilation, severe MR.  NYHA class IV symptoms. He has a wide LBBB.  RHC was done in 9/19 showing low output, CI 1.4.  He was admitted to start milrinone, unable to wean off milrinone and remained on 0.25 mcg/kg/min at home.  Medtronic ICD was placed, he has a His bundle lead but EP was unable to  get an LV lead in place => His bundle pacing does not narrow his QRS and did not help [place] wean him off milrinone.  He did well on milrinone 0.25 mcg/kg/min, NYHA class II.  RHC 10/11 shows filling pressures and cardiac output to be fairly well optimized. HM3 placed 10/14.  - Coox 75% on milrinone 0.125 mcg/kg/min. Stop milrinone.  - Volume status remains elevated but diuresed well with Lasix yesterday.  Increase lasix gtt to 8 mg/hr. Good UOP with 2.9 L out, I/Os + 330 cc with drips. - Continue ASA 325 until INR > 1.8, on warfarin.  - Start low dose heparin gtt today 2. ID: Treated with Augmentin for dental infection pre-op.   - POD#1 had fever of 103.5. Started on Vanc/Unasyn.  - Tmax 99. WBC down slightly.  - ID following.  - Cultures NGTD 3. ETOH Abuse:  - No longer drinking. No change.   4. Tobacco abuse: He has quit smoking since admission in 9/19.  - Continue to encouraged complete cessation.  5. Mitral regurgitation: Severe functional MR.  With LV dilated > 7 cm, suspect Mitraclip would be unlikely to help him appreciably.   - Decreased MR s/p LVAD placement.   6. VT: Patient had episode of VT requiring ATP.   - Had VT for around 5 minutes 01/29/18. Asymptomatic.  - Change amio po back to amio gtt.  - Keep K> 4.0 Mg > 2.0.  K 3.7 today. Mg 2.0.   Graciella Freer, PA-C  01/30/2018 7:10 AM   VAD Team Pager [phone] (7am - 7am) ++VAD ISSUES ONLY++   Advanced Heart Failure Team Pager [phone] (M-F; 7a - 4p)  Please contact [facility] for night-coverage after hours (4p -7a ) and weekends  on [email or web address]  Patient seen with PA, agree with the above note.  Had some VT yesterday, back on amiodarone gtt.  CVP 12 today with co-ox 75% on milrinone 0.125.  Having pain at pocket site.  Walked in hall already today.   On exam, JVP 10 cm, normal LVAD sounds, no edema.  Lungs clear.   Today, will stop milrinone.  Continue Lasix, increase gtt to 8 mg/hr.  Replace K and Mg.   He remains on broad spectrum abx with post-op fever, currently afebrile with WBCs trending down, cultures negative so far.    Continue IV amiodarone for now, hopefully less drive for VT with milrinone off.  Convert to po tomorrow if he remains stable.   CRITICAL CARE Performed by: Marca Ancona  Total critical care time: 35 minutes  Critical care time was exclusive of separately billable procedures and treating other patients.  Critical care was necessary to treat or prevent imminent or life-threatening deterioration.  Critical care was time spent personally by me on the following activities: development of treatment plan with patient and/or surrogate as well as nursing, discussions with consultants, evaluation of patient's response to treatment, examination of patient, obtaining history from patient or surrogate, ordering and performing treatments and interventions, ordering and review of laboratory studies, ordering and review of radiographic studies, pulse oximetry and re-evaluation of patient's condition.  Marca Ancona 01/30/2018 8:07 AM

## 2018-01-30 NOTE — Progress Notes (Addendum)
LVAD Coordinator Rounding Note:  Admitted 01/18/18 due to fever and chills. Started on broad spectrum antibiotics. Teeth extractions done by Dr. Kristin Bruins 01/20/18. RHC done 01/24/18.   HM III LVAD implanted on 01/27/18 by Dr. Donata Clay under Destination Therapy criteria.  Patient is laying in bed. He says he walked the unit twice today with PT. He feels good about his progress, and is looking forward to getting well enough to move back to [place]. States his pain is better this afternoon after removal of one of his chest tubes. Had PICC line placed today.    Vital signs: Temp: 94 HR: 91 AV-paced Art line BP:  106/82 (92) Doppler: 98 O2 Sat: 99% on 4 l/[place] Wt: 137.5>158.7>149.9>147.2 lbs   LVAD interrogation reveals:  Speed: 5600 Flow: 5.2 Power: 4.2w PI: 2.6 Alarms: none Events:  none Hematocrit: 28  Fixed speed: 5600 Low speed limit: 5300   Drive Line:  Existing VAD dressing removed and site care performed using sterile technique. Drive line exit site cleaned with Chlora prep applicators x 2, allowed to dry, and gauze with silver stip re-applied. Exit site with no tissue ingrowth, 2 sutures intact, and the velour is fully implanted at exit site. Scant amount of bloody drainage noted at site, and on gauze dressing. No redness, tenderness, foul odor or rash noted. Drive line anchor re-applied. Dressing to be changed daily by VAD nurse, nurse champion, or trained caregiver.  No family present at bedside for dressing change education.    Labs:  LDH trend: 282>324>263   INR trend: 1.26>1.33>1.31>1.30  WBC trend: 8.6>15.6>16.3>14.5  Anticoagulation Plan: -INR Goal:  2.0 - 2.5 -ASA Dose: ASA 324mg  daily until INR therapeutic  Blood Products:  -Intraop: 4 FFP; 450 cell saver   - Post op:  01/27/18>>1 PRBC  Device: - Medtronic ICD - Pacing: DDD 60 - Therapies: off currently  Drips: Milrinone 0.32mcg/kg/min--off 01/30/18 Amiodarone 30mg /hr--restarted 01/30/18 Lasix 8mg /hr   Heparin--started 01/30/18  Arrythmias:   Respiratory: extubated 01/28/18   Infection: Blood/urine/sputum cultures ordered due to fever 101.3.  - 01/28/18 BC's>> NTD -  01/28/18 urine cultures>>NTD  Adverse Events on VAD: -   VAD Education:  1. Demonstrated dressing change to patient today. Discussed importance of sterile technique, handwashing, cap, and mask. 2. Discussed transition of power sources. Patient states he as able to successfully transition between power sources today with PT. He verbalized importance of one power source at a time.   3. No family at bedside presently. Mr. Welp states that his girlfriend "just left" to go to work.     Plan/Recommendations:  1. Daily dressing changes per VAD RN, nurse champion, or trained caregiver.  2. Call VAD pager with any equipment or drive line questions, or needs.    [name] RN VAD Coordinator  Office: [phone]  24/7 Pager: [phone]

## 2018-01-30 NOTE — Progress Notes (Signed)
HeartMate 3 Rounding Note  HM# Implant 10-14 for DT indication Subjective:   Nonischemic cardiomyopathy, EF 15%, atrial dependent at home  Patient with adequate function, extubated postop day 1,mobilized OOB to chair  Patient developed postop fever, probably inflammatory response to cardiopulmonary bypass.  Now improved.  Continue empiric Unasyn and vancomycin  Patient out of bed to chair, pain improved Edema on chest x-ray improved with Lasix drip LVAD parameters satisfactory with few PI events Patient developed runs of nonsustained VT now on IV amnio  Able to walk in hallway INR remains subtherapeutic on postop day 3-IV heparin started carefully goal heparin activity 0.3  LVAD INTERROGATION:  HeartMate 3 LVAD:  Flow 4.7 L/min  liters/min, speed 5600 rpm, power 4.1, PI 3.2.  Controller connected.   Objective:    Vital Signs:   Temp:  [97.1 F (36.2 C)-99 F (37.2 C)] 98.2 F (36.8 C) (10/17 1613) Pulse Rate:  [49-101] 60 (10/17 1500) Resp:  [18-33] 23 (10/17 1500) BP: (76-132)/(45-102) 99/88 (10/17 1500) SpO2:  [93 %-100 %] 99 % (10/17 1500) Weight:  [66.8 kg] 66.8 kg (10/17 0500) Last BM Date: 01/27/18 Mean arterial Pressure 75-85 mm Hg  Intake/Output:   Intake/Output Summary (Last 24 hours) at 01/30/2018 1704 Last data filed at 01/30/2018 1040 Gross per 24 hour  Intake 2345.31 ml  Output 1645 ml  Net 700.31 ml     Physical Exam: General:  Well appearing. No resp difficulty HEENT: normal Neck: supple. JVP . Carotids 2+ bilat; no bruits. No lymphadenopathy or thryomegaly appreciated. Cor: Mechanical heart sounds with LVAD hum present. Lungs: clear Abdomen: soft, nontender, nondistended. No hepatosplenomegaly. No bruits or masses. Good bowel sounds. Extremities: no cyanosis, clubbing, rash, edema Neuro: alert & orientedx3, cranial nerves grossly intact. moves all 4 extremities w/o difficulty. Affect pleasant  Telemetry: av pacing  Labs: Basic Metabolic  Panel: Recent Labs  Lab 01/27/18 1955  01/28/18 0542  01/28/18 1714 01/28/18 1743 01/29/18 0405 01/29/18 1515 01/30/18 0352 01/30/18 1300  NA  --    < > 135   < > 131* 131* 132* 132* 130* 132*  K  --    < > 4.4   < > 3.8 3.9 3.7 3.6 3.4* 3.2*  CL  --    < > 100   < > 99 96* 97* 93* 94* 93*  CO2  --   --  25  --  22  --  25  --  24 28  GLUCOSE  --    < > 133*   < > 202* 208* 152* 129* 191* 193*  BUN  --    < > 14   < > 14 14 11 9 9 9   CREATININE 1.07   < > 0.98   < > 1.12 0.90 0.91 0.60* 0.73 0.79  CALCIUM  --   --  9.0  --  9.0  --  9.4  --  8.7* 8.6*  MG 3.4*  --  2.7*  --  2.2  --  2.0  --  1.8  --   PHOS  --   --  3.6  --   --   --  3.2  --  2.2*  --    < > = values in this interval not displayed.    Liver Function Tests: Recent Labs  Lab 01/28/18 0542 01/29/18 0405 01/30/18 0352  AST 84* 69* 35  ALT 16 17 14   ALKPHOS 52 58 54  BILITOT 2.6* 1.8* 0.8  PROT  5.9* 6.4* 5.7*  ALBUMIN 3.6 3.5 2.9*   No results for input(s): LIPASE, AMYLASE in the last 168 hours. No results for input(s): AMMONIA in the last 168 hours.  CBC: Recent Labs  Lab 01/27/18 0350  01/27/18 1337  01/27/18 1955  01/28/18 0542  01/28/18 1714 01/28/18 1743 01/29/18 0405 01/29/18 1515 01/30/18 0352  WBC 9.0  --  13.4*  --  11.4*  --  8.6  --  15.6*  --  16.3*  --  14.5*  NEUTROABS 6.1  --  10.8*  --   --   --  6.9  --   --   --  14.9*  --  11.7*  HGB 13.1   < > 12.0*  --  9.6*   < > 9.6*   < > 10.6* 10.2* 9.8* 9.5* 10.1*  HCT 36.6*   < > 34.9*  --  27.5*   < > 28.3*   < > 30.4* 30.0* 27.6* 28.0* 29.3*  MCV 90.1  --  90.4  --  91.4  --  92.5  --  91.0  --  89.6  --  90.7  PLT 261   < > 177   < > 168  --  124*  --  144*  --  128*  --  156   < > = values in this interval not displayed.    INR: Recent Labs  Lab 01/27/18 1339 01/28/18 0634 01/29/18 0405 01/30/18 0352  INR 1.26 1.33 1.31 1.30    Other results:  EKG:   Imaging: Dg Chest Port 1 View  Result Date:  01/30/2018 CLINICAL DATA:  52 year old male postoperative day 3 LVAD. EXAM: PORTABLE CHEST 1 VIEW COMPARISON:  01/29/2018 and earlier. FINDINGS: Portable AP semi upright view at 0547 hours. Stable left chest tube and right IJ introducer sheath. Stable cardiac size and mediastinal contours. LVAD and right side AICD appears stable. Regressed bilateral pulmonary vascularity. No pneumothorax. No pleural effusion is evident. Mild retrocardiac atelectasis suspected. Negative visible bowel gas pattern. Epicardial pacer wires in place. IMPRESSION: 1. Stable postoperative changes, lines and tubes. 2. Regressed pulmonary vascularity/edema. No pneumothorax or pleural effusion. Mild atelectasis. Electronically Signed   By: Odessa Fleming M.D.   On: 01/30/2018 09:28   Dg Chest Port 1 View  Result Date: 01/29/2018 CLINICAL DATA:  Chest tube placement. EXAM: PORTABLE CHEST 1 VIEW COMPARISON:  Radiograph of January 28, 2018. FINDINGS: Stable cardiomegaly. Stable position of right-sided pacemaker. Endotracheal and nasogastric tubes have been removed. Left ventricular assist device is unchanged in position. Stable left-sided chest tube is noted without pneumothorax. Increased central pulmonary vascular congestion and bibasilar opacities are noted concerning for worsening edema or atelectasis with probable mild right pleural effusion. Bony thorax is unremarkable. IMPRESSION: Endotracheal and nasogastric tubes have been removed. Stable position of left ventricular assist device. Left-sided chest tube is again noted without pneumothorax. Increased bibasilar opacities are noted concerning for worsening edema or atelectasis with probable mild right pleural effusion. Electronically Signed   By: Lupita Raider, M.D.   On: 01/29/2018 10:22   [place] Ekg Site Rite  Result Date: 01/30/2018 If Site Rite image not attached, placement could not be confirmed due to current cardiac rhythm.  [place] Ekg Site Rite  Result Date: 01/29/2018 If Site  Rite image not attached, placement could not be confirmed due to current cardiac rhythm.    Medications:     Scheduled Medications: . acetaminophen  1,000 mg Oral Q6H   Or  .  acetaminophen (TYLENOL) oral liquid 160 mg/5 mL  1,000 mg Per Tube Q6H  . [START ON 01/31/2018] aspirin EC  81 mg Oral Daily  . bisacodyl  10 mg Oral Daily   Or  . bisacodyl  10 mg Rectal Daily  . Chlorhexidine Gluconate Cloth  6 each Topical Daily  . docusate sodium  200 mg Oral Daily  . gabapentin  300 mg Oral BID  . hydrALAZINE  10 mg Oral Q6H  . insulin aspart  0-24 Units Subcutaneous TID AC & HS  . lactose free nutrition  237 mL Oral TID WC  . mouth rinse  15 mL Mouth Rinse BID  . metoCLOPramide (REGLAN) injection  10 mg Intravenous Q6H  . pantoprazole  40 mg Oral Daily  . potassium chloride  20 mEq Oral Daily  . sodium chloride flush  10-40 mL Intracatheter Q12H  . sodium chloride flush  3 mL Intravenous Q12H  . warfarin  5 mg Oral q1800  . Warfarin - Physician Dosing Inpatient   Does not apply q1800    Infusions: . sodium chloride Stopped (01/28/18 1600)  . sodium chloride Stopped (01/28/18 0600)  . amiodarone 30 mg/hr (01/30/18 1342)  . amiodarone    . ampicillin-sulbactam (UNASYN) IV 3 g (01/30/18 1154)  . furosemide (LASIX) infusion 8 mg/hr (01/30/18 1506)  . heparin 600 Units/hr (01/30/18 1020)  . nitroGLYCERIN Stopped (01/29/18 0233)  . vancomycin 1,250 mg (01/30/18 0820)    PRN Medications: sodium chloride, hydrALAZINE, morphine injection, ondansetron (ZOFRAN) IV, oxyCODONE, potassium chloride, traMADol   Assessment:  HeartMate 3 implantation on October 14 Preop history of nonischemic cardiomyopathy, severe MR, EF 15% Patient ICD-pacemaker, chronically paced rhythm RV function has been adequate postop, episode of atrial fibrillation treated with amiodarone Postop nonsustained VT treated with amiodarone, magnesium, milrinone weaned off Plan/Discussion:   Fever has resolved and  PICC line has been placed .coox remains satisfactory IV heparin bridge started today postop day 3 Mediastinal tube removed today, will probably remove left pleural tube tomorrow Patient now making good progress postop day 3 HeartMate 3 implantation Diet advanced to mechanical soft because of preoperative dental extraction  I reviewed the LVAD parameters from today, and compared the results to the patient's prior recorded data.  No programming changes were made.  The LVAD is functioning within specified parameters.  The patient performs LVAD self-test daily.  LVAD interrogation was negative for any significant power changes, alarms or PI events/speed drops.  LVAD equipment check completed and is in good working order.  Back-up equipment present.   LVAD education done on emergency procedures and precautions and reviewed exit site care.  Length of Stay: [address]  Kathlee Nations Monroe III 01/30/2018, 5:04 PM

## 2018-01-30 NOTE — Progress Notes (Signed)
Peripherally Inserted Central Catheter/Midline Placement  The IV Nurse has discussed with the patient and/or persons authorized to consent for the patient, the purpose of this procedure and the potential benefits and risks involved with this procedure.  The benefits include less needle sticks, lab draws from the catheter, and the patient may be discharged home with the catheter. Risks include, but not limited to, infection, bleeding, blood clot (thrombus formation), and puncture of an artery; nerve damage and irregular heartbeat and possibility to perform a PICC exchange if needed/ordered by physician.  Alternatives to this procedure were also discussed.  Bard Power PICC patient education guide, fact sheet on infection prevention and patient information card has been provided to patient /or left at bedside.    PICC/Midline Placement Documentation  PICC Triple Lumen 01/30/18 PICC Right Basilic 38 cm 0 cm (Active)  Indication for Insertion or Continuance of Line Administration of hyperosmolar/irritating solutions (i.e. TPN, Vancomycin, etc.) 01/30/2018  9:35 AM  Exposed Catheter (cm) 0 cm 01/30/2018  9:35 AM  Site Assessment Clean;Dry;Intact 01/30/2018  9:35 AM  Lumen #1 Status Flushed;Saline locked;Blood return noted 01/30/2018  9:35 AM  Lumen #2 Status Flushed;Saline locked;Blood return noted 01/30/2018  9:35 AM  Lumen #3 Status Flushed;Saline locked;Blood return noted 01/30/2018  9:35 AM  Dressing Type Transparent;Securing device 01/30/2018  9:35 AM  Dressing Status Clean;Dry;Intact;Antimicrobial disc in place 01/30/2018  9:35 AM  Dressing Change Due 02/06/18 01/30/2018  9:35 AM       Romie Jumper 01/30/2018, 9:43 AM

## 2018-01-31 ENCOUNTER — Inpatient Hospital Stay (HOSPITAL_COMMUNITY): Payer: Medicaid Other

## 2018-01-31 LAB — CBC WITH DIFFERENTIAL/PLATELET
Abs Immature Granulocytes: 0.05 10*3/uL (ref 0.00–0.07)
Basophils Absolute: 0 10*3/uL (ref 0.0–0.1)
Basophils Relative: 0 %
Eosinophils Absolute: 0.1 10*3/uL (ref 0.0–0.5)
Eosinophils Relative: 1 %
HCT: 27.5 % — ABNORMAL LOW (ref 39.0–52.0)
Hemoglobin: 9.5 g/dL — ABNORMAL LOW (ref 13.0–17.0)
Immature Granulocytes: 1 %
Lymphocytes Relative: 11 %
Lymphs Abs: 1.2 10*3/uL (ref 0.7–4.0)
MCH: 31.8 pg (ref 26.0–34.0)
MCHC: 34.5 g/dL (ref 30.0–36.0)
MCV: 92 fL (ref 80.0–100.0)
Monocytes Absolute: 0.9 10*3/uL (ref 0.1–1.0)
Monocytes Relative: 8 %
Neutro Abs: 8.2 10*3/uL — ABNORMAL HIGH (ref 1.7–7.7)
Neutrophils Relative %: 79 %
Platelets: 160 10*3/uL (ref 150–400)
RBC: 2.99 MIL/uL — ABNORMAL LOW (ref 4.22–5.81)
RDW: 13.9 % (ref 11.5–15.5)
WBC: 10.4 10*3/uL (ref 4.0–10.5)
nRBC: 0 % (ref 0.0–0.2)

## 2018-01-31 LAB — TYPE AND SCREEN
ABO/RH(D): B POS
Antibody Screen: NEGATIVE
Unit division: 0
Unit division: 0
Unit division: 0
Unit division: 0

## 2018-01-31 LAB — COMPREHENSIVE METABOLIC PANEL
ALT: 13 U/L (ref 0–44)
AST: 22 U/L (ref 15–41)
Albumin: 3.2 g/dL — ABNORMAL LOW (ref 3.5–5.0)
Alkaline Phosphatase: 58 U/L (ref 38–126)
Anion gap: 10 (ref 5–15)
BUN: 6 mg/dL (ref 6–20)
CO2: 26 mmol/L (ref 22–32)
Calcium: 8.8 mg/dL — ABNORMAL LOW (ref 8.9–10.3)
Chloride: 93 mmol/L — ABNORMAL LOW (ref 98–111)
Creatinine, Ser: 0.74 mg/dL (ref 0.61–1.24)
GFR calc Af Amer: >60 mL/min (ref >60)
GFR calc non Af Amer: >60 mL/min (ref >60)
Glucose, Bld: 212 mg/dL — ABNORMAL HIGH (ref 70–99)
Potassium: 3.1 mmol/L — ABNORMAL LOW (ref 3.5–5.1)
Sodium: 129 mmol/L — ABNORMAL LOW (ref 135–145)
Total Bilirubin: 0.6 mg/dL (ref 0.3–1.2)
Total Protein: 6 g/dL — ABNORMAL LOW (ref 6.5–8.1)

## 2018-01-31 LAB — BPAM RBC
Blood Product Expiration Date: 201911022359
Blood Product Expiration Date: 201911042359
Blood Product Expiration Date: 201911042359
Blood Product Expiration Date: 201911052359
ISSUE DATE / TIME: 201910150143
ISSUE DATE / TIME: 201910150232
ISSUE DATE / TIME: 201910170640
ISSUE DATE / TIME: 201910170735
Unit Type and Rh: 7300
Unit Type and Rh: 7300
Unit Type and Rh: 7300
Unit Type and Rh: 7300

## 2018-01-31 LAB — HEPARIN LEVEL (UNFRACTIONATED): Heparin Unfractionated: <0.1 IU/mL — ABNORMAL LOW (ref 0.30–0.70)

## 2018-01-31 LAB — COOXEMETRY PANEL
CARBOXYHEMOGLOBIN: 1.8 % — AB (ref 0.5–1.5)
Carboxyhemoglobin: 1.3 % (ref 0.5–1.5)
METHEMOGLOBIN: 1.4 % (ref 0.0–1.5)
METHEMOGLOBIN: 0.9 % (ref 0.0–1.5)
O2 Saturation: 50.7 %
O2 Saturation: 57 %
Total hemoglobin: 11.1 g/dL — ABNORMAL LOW (ref 12.0–16.0)
Total hemoglobin: 9.8 g/dL — ABNORMAL LOW (ref 12.0–16.0)

## 2018-01-31 LAB — BASIC METABOLIC PANEL
ANION GAP: 8 (ref 5–15)
BUN: 8 mg/dL (ref 6–20)
CHLORIDE: 97 mmol/L — AB (ref 98–111)
CO2: 26 mmol/L (ref 22–32)
Calcium: 8.9 mg/dL (ref 8.9–10.3)
Creatinine, Ser: 0.77 mg/dL (ref 0.61–1.24)
GFR calc non Af Amer: >60 mL/min (ref >60)
Glucose, Bld: 144 mg/dL — ABNORMAL HIGH (ref 70–99)
POTASSIUM: 4 mmol/L (ref 3.5–5.1)
SODIUM: 131 mmol/L — AB (ref 135–145)

## 2018-01-31 LAB — GLUCOSE, CAPILLARY
GLUCOSE-CAPILLARY: 209 mg/dL — AB (ref 70–99)
GLUCOSE-CAPILLARY: 148 mg/dL — AB (ref 70–99)
GLUCOSE-CAPILLARY: 148 mg/dL — AB (ref 70–99)
GLUCOSE-CAPILLARY: 113 mg/dL — AB (ref 70–99)

## 2018-01-31 LAB — PROTIME-INR
INR: 2
INR: 2.24
Prothrombin Time: 22.4 seconds — ABNORMAL HIGH (ref 11.4–15.2)
Prothrombin Time: 24.5 seconds — ABNORMAL HIGH (ref 11.4–15.2)

## 2018-01-31 LAB — LACTATE DEHYDROGENASE: LDH: 262 U/L — ABNORMAL HIGH (ref 98–192)

## 2018-01-31 LAB — VANCOMYCIN, TROUGH: Vancomycin Tr: 12 ug/mL — ABNORMAL LOW (ref 15–20)

## 2018-01-31 LAB — MAGNESIUM: Magnesium: 2 mg/dL (ref 1.7–2.4)

## 2018-01-31 LAB — APTT: aPTT: 53 seconds — ABNORMAL HIGH (ref 24–36)

## 2018-01-31 MED ORDER — POTASSIUM CHLORIDE 20 MEQ PO PACK: 40.0000 meq | PACK | Freq: Two times a day (BID) | ORAL | Status: DC

## 2018-01-31 MED ORDER — AMIODARONE HCL 200 MG PO TABS
200.0000 mg | ORAL_TABLET | Freq: Two times a day (BID) | ORAL | Status: DC
  Administered 2018-01-31 – 2018-02-09 (×19): 200 mg via ORAL
  Filled 2018-01-31 (×19): qty 1

## 2018-01-31 MED ORDER — WARFARIN SODIUM 2 MG PO TABS: 4.0000 mg | ORAL_TABLET | Freq: Once | ORAL | Status: DC

## 2018-01-31 MED ORDER — LACTATED RINGERS IV SOLN: INTRAVENOUS | Status: DC

## 2018-01-31 MED ORDER — LIP MEDEX EX OINT
TOPICAL_OINTMENT | CUTANEOUS | Status: DC | PRN
  Administered 2018-01-31: 14:00:00 via TOPICAL
  Filled 2018-01-31: qty 7

## 2018-01-31 MED ORDER — POTASSIUM CHLORIDE CRYS ER 20 MEQ PO TBCR
20.0000 meq | EXTENDED_RELEASE_TABLET | Freq: Once | ORAL | Status: AC
  Administered 2018-01-31: 20 meq via ORAL
  Filled 2018-01-31: qty 1

## 2018-01-31 MED ORDER — POTASSIUM CHLORIDE 10 MEQ/50ML IV SOLN
10.0000 meq | INTRAVENOUS | Status: AC
  Administered 2018-01-31 (×3): 10 meq via INTRAVENOUS
  Filled 2018-01-31 (×3): qty 50

## 2018-01-31 MED ORDER — ENSURE ENLIVE PO LIQD
237.0000 mL | Freq: Two times a day (BID) | ORAL | Status: DC
  Administered 2018-01-31 – 2018-02-03 (×7): 237 mL via ORAL

## 2018-01-31 MED ORDER — WARFARIN - PHARMACIST DOSING INPATIENT
Freq: Every day | Status: DC
  Administered 2018-01-31 – 2018-02-09 (×9)

## 2018-01-31 MED ORDER — POTASSIUM CHLORIDE CRYS ER 20 MEQ PO TBCR
40.0000 meq | EXTENDED_RELEASE_TABLET | Freq: Two times a day (BID) | ORAL | Status: DC
  Administered 2018-01-31 – 2018-02-07 (×14): 40 meq via ORAL
  Filled 2018-01-31 (×14): qty 2

## 2018-01-31 MED ORDER — SODIUM CHLORIDE 0.9 % IV SOLN
INTRAVENOUS | Status: DC | PRN
  Administered 2018-02-02: 07:00:00 via INTRAVENOUS

## 2018-01-31 MED ORDER — POTASSIUM CHLORIDE CRYS ER 20 MEQ PO TBCR: 20.0000 meq | EXTENDED_RELEASE_TABLET | ORAL | Status: DC | PRN

## 2018-01-31 NOTE — Progress Notes (Signed)
CSW met with pt to check in on progress- pt reports that things are going well and that he is feeling ok overall but still some pain on and of at the incision site.  Patient also reporting discomfort from remaining chest tubes but hopeful to have them removed soon.  Pt reports no other concerns or needs at this time  Jorge Ny, LCSW Clinical Social Worker [facility] [phone]

## 2018-01-31 NOTE — Progress Notes (Signed)
Foley catheter discussed with Dr. Donata Clay and Dr. Shirlee Latch - will be discontinued tomorrow (02/01/18) when patient is transitioned off Lasix drip. Patient education provided.

## 2018-01-31 NOTE — Progress Notes (Signed)
ANTICOAGULATION CONSULT NOTE - Follow Up Consult  Pharmacy Consult for heparin Indication: LVAD  Labs: Recent Labs    01/28/18 0639  01/29/18 0405  01/30/18 0352 01/30/18 1300 01/30/18 1730 01/30/18 1741 01/30/18 1747 01/31/18 0250  HGB  --    < > 9.8*   < > 10.1*  --   --   --  9.9* 9.5*  HCT  --    < > 27.6*   < > 29.3*  --   --   --  29.0* 27.5*  PLT  --    < > 128*  --  156  --   --   --   --  160  APTT  --   --   --   --   --   --   --  55*  --  53*  LABPROT  --   --  16.1*  --  16.1*  --   --   --   --  22.4*  INR  --   --  1.31  --  1.30  --   --   --   --  2.00  HEPARINUNFRC <0.10*  --   --   --   --   --  <0.10*  --   --  <0.10*  CREATININE  --    < > 0.91   < > 0.73 0.79  --   --  0.70 0.74   < > = values in this interval not displayed.    Assessment: 52yo male remains subtherapeutic on heparin after rate increase; managing conservatively.  Goal of Therapy:  Heparin level 0.2-0.3 units/ml   Plan:  Will increase heparin gtt by 1-2 units/kg/hr to 800 units/hr and check level in 6 hours.    Vernard Gambles, PharmD, BCPS  01/31/2018,6:26 AM

## 2018-01-31 NOTE — Progress Notes (Signed)
ID PROGRESS NOTE  Patient remains afebrile, leukocytosis improving and blood cx ngtd at 72hr. Recommend to stop abtx and observe off  Abtx. Suspect that his high fever and leukocytosis were in response to surgery  Davidmichael Zarazua B. Drue Second MD MPH [facility] [phone]

## 2018-01-31 NOTE — Progress Notes (Signed)
VAD exist site care: Existing VAD dressing removed and site care performed using sterile technique. Drive line exit site cleaned with Chlora prep applicators x 2, allowed to dry, and gauze with silver stip re-applied. Exit site with no tissue ingrowth, 2 sutures intact, and the velour is fully implanted at exit site. Scant amount of bloody drainage noted at site, and on gauze dressing. No redness, tenderness, foul odor or rash noted. Drive line anchor re-applied. Dressing to be changed daily by VAD nurse, nurse champion, or trained caregiver.  No family present at bedside for dressing change education.    VAD Education: 1. Patient demonstrated transition between power sources, battery insertion/removal from clips, battery power check on both battery and controller, and verbalized importance of sterile technique with dressing changes.     [name] RN VAD Coordinator  Office: [phone]  24/7 Pager: [phone]

## 2018-01-31 NOTE — Progress Notes (Signed)
LVAD Coordinator Rounding Note:  Admitted 01/18/18 due to fever and chills. Started on broad spectrum antibiotics. Teeth extractions done by Dr. Kristin Bruins 01/20/18. RHC done 01/24/18.   HM III LVAD implanted on 01/27/18 by Dr. Donata Clay under Destination Therapy criteria.  Patient sitting up in chair. He says he walked the entire unit this morning. States that he is having a lot of pain on his left side, but otherwise he is feeling good. No family is present at bedside.     Vital signs: Temp:  HR: 97 AV-paced Art line BP:  102/83 (91) Doppler: 90 O2 Sat: 99% on RA Wt: 137.5>158.7>149.9>147.2>147.9 lbs   LVAD interrogation reveals:  Speed: 5600 Flow: 5.4 Power: 4.3w PI: 2.7 Alarms: none Events: none Hematocrit: 28  Fixed speed: 5600 Low speed limit: 5300   Drive Line:  Left lower abdominal dressing clean, dry, intact. Will change later this afternoon when patient is back in bed.    Labs:  LDH trend: 282>324>263>262   INR trend: 1.26>1.33>1.31>1.30>2.0  WBC trend: 8.6>15.6>16.3>14.5>10.4  Anticoagulation Plan: -INR Goal:  2.0 - 2.5 -ASA Dose: ASA 324mg  daily until INR therapeutic  Blood Products:  -Intraop: 4 FFP; 450 cell saver   - Post op:  01/27/18>>1 PRBC  Device: - Medtronic ICD - Pacing: DDD 60 - Therapies: off currently  Drips: Milrinone 0.23mcg/kg/min--off 01/30/18 Amiodarone 30mg /hr--restarted 01/30/18--transitioned to PO 01/31/18 Lasix 8mg /hr  Heparin--started 01/30/18--off 01/31/18  Arrythmias: 01/29/18- VT for roughly 5 minutes.  Amio bolus given.  Respiratory: extubated 01/28/18   Infection: Blood/urine/sputum cultures ordered due to fever 101.3.  - 01/28/18 BC's>> NTD -  01/28/18 urine cultures>>NTD  Adverse Events on VAD: -   VAD Education:  1. Per bedside RN report, patient was able to make power transitions without prompting this morning for walk.  2. Patient performed self test with minimal verbal cues.  2. At this time he is  having a lot of pain, and would like to rest.     Plan/Recommendations:  1. Daily dressing changes per VAD RN, nurse champion, or trained caregiver.  2. Call VAD pager with any equipment or drive line questions, or needs.    [name] RN VAD Coordinator  Office: [phone]  24/7 Pager: [phone]

## 2018-01-31 NOTE — Progress Notes (Addendum)
ANTICOAGULATION CONSULT NOTE  Pharmacy Consult for Warfarin Indication: LVAD  Allergies  Allergen Reactions  . Norflex [Orphenadrine] Swelling    FACE [ANGIOEDEMA]    Patient Measurements: Height: 5\' 7"  (170.2 cm) Weight: 147 lb 14.9 oz (67.1 kg) IBW/kg (Calculated) : 66.1  Vital Signs: Temp: 97.8 F (36.6 C) (10/18 0013) Temp Source: Oral (10/18 0013) BP: 102/86 (10/18 1100) Pulse Rate: 97 (10/18 1100)  Labs: Recent Labs    01/29/18 0405  01/30/18 0352 01/30/18 1300 01/30/18 1730 01/30/18 1741 01/30/18 1747 01/31/18 0250  HGB 9.8*   < > 10.1*  --   --   --  9.9* 9.5*  HCT 27.6*   < > 29.3*  --   --   --  29.0* 27.5*  PLT 128*  --  156  --   --   --   --  160  APTT  --   --   --   --   --  55*  --  53*  LABPROT 16.1*  --  16.1*  --   --   --   --  22.4*  INR 1.31  --  1.30  --   --   --   --  2.00  HEPARINUNFRC  --   --   --   --  <0.10*  --   --  <0.10*  CREATININE 0.91   < > 0.73 0.79  --   --  0.70 0.74   < > = values in this interval not displayed.    Estimated Creatinine Clearance: 102.1 mL/min (by C-G formula based on SCr of 0.74 mg/dL).   Medical History: Past Medical History:  Diagnosis Date  . AICD (automatic cardioverter/defibrillator) present   . CHF (congestive heart failure) (HCC)   . COPD (chronic obstructive pulmonary disease) (HCC)   . Depression   . Dyspnea   . GERD (gastroesophageal reflux disease)   . Nonischemic cardiomyopathy (HCC)    a. diagnosed in 2011 with EF 30% at that time b. EF improved to 50% by echo in 02/2016  . Pneumonia   . Tachycardia      Assessment: 51 yoM now s/p HM3 on 10/14 started on IV heparin 10/17 due to subtherapeutic INRs.   INR therapeutic at 2 s/p warfarin 5 mg last night. Heparin drip stopped. Hb low but stable. No signs/symptoms of bleeding.  Goal of Therapy:  INR 2-2.5 Monitor platelets by anticoagulation protocol: Yes   Plan:  - Hold warfarin tonight - Daily CBC and INR   Marcelino Freestone,  PharmD PGY2 Cardiology Pharmacy Resident Phone [phone] Please check AMION for all Pharmacist numbers by unit 01/31/2018 11:22 AM

## 2018-01-31 NOTE — Progress Notes (Signed)
HeartMate 3 Rounding Note  HM# Implant 10-14 for DT indication Subjective:   Nonischemic cardiomyopathy, EF 15%, inotrope dependent requiring hospitalization prior to implantation  Patient with adequate RV  function, extubated postop day 1,mobilized OOB to chair  Patient developed postop fever, probably inflammatory response to cardiopulmonary bypass.  Now improved.   empiric Unasyn and vancomycin now being stopped after negative cultures and resolution of fever  Patient out of bed to chair,, now walking in hallway pain improved Edema on chest x-ray improved with Lasix drip LVAD parameters satisfactory with few PI events Patient developed runs of nonsustained VT initially on IV amnio.  Milrinone has been weaned off and amiodarone has been transitioned to oral, VT has resolved.  Patient had heparin bridge postop day 3 but now INR is therapeutic at 2.0 and heparin stopped  Chest x-ray shows improved aeration, weight still up 5 pounds and patient will remain on IV Lasix for 1 more day.  VAD parameters all satisfactory with VAD flows almost 5 L/min  LVAD INTERROGATION:  HeartMate 3 LVAD:  Flow 4.7 L/min  liters/min, speed 5600 rpm, power 4.1, PI 3.2.  Controller connected.   Objective:    Vital Signs:   Temp:  [97.1 F (36.2 C)-98.2 F (36.8 C)] 97.8 F (36.6 C) (10/18 0013) Pulse Rate:  [48-99] 62 (10/18 1000) Resp:  [0-33] 18 (10/18 1000) BP: (72-113)/(45-102) 102/83 (10/18 1000) SpO2:  [93 %-100 %] 96 % (10/18 1000) Weight:  [67.1 kg] 67.1 kg (10/18 0600) Last BM Date: 01/30/18 Mean arterial Pressure 75-85 mm Hg  Intake/Output:   Intake/Output Summary (Last 24 hours) at 01/31/2018 1016 Last data filed at 01/31/2018 1000 Gross per 24 hour  Intake 2410.49 ml  Output 3110 ml  Net -699.51 ml     Physical Exam: General:  Well appearing. No resp difficulty HEENT: normal Neck: supple. JVP . Carotids 2+ bilat; no bruits. No lymphadenopathy or thryomegaly appreciated. Cor:  Mechanical heart sounds with LVAD hum present. Lungs: clear Abdomen: soft, nontender, nondistended. No hepatosplenomegaly. No bruits or masses. Good bowel sounds. Extremities: no cyanosis, clubbing, rash, edema Neuro: alert & orientedx3, cranial nerves grossly intact. moves all 4 extremities w/o difficulty. Affect pleasant  Telemetry: av pacing  Labs: Basic Metabolic Panel: Recent Labs  Lab 01/28/18 0542  01/28/18 1714  01/29/18 0405 01/29/18 1515 01/30/18 0352 01/30/18 1300 01/30/18 1747 01/31/18 0250  NA 135   < > 131*   < > 132* 132* 130* 132* 127* 129*  K 4.4   < > 3.8   < > 3.7 3.6 3.4* 3.2* 3.0* 3.1*  CL 100   < > 99   < > 97* 93* 94* 93* 91* 93*  CO2 25  --  22  --  25  --  24 28  --  26  GLUCOSE 133*   < > 202*   < > 152* 129* 191* 193* 234* 212*  BUN 14   < > 14   < > 11 9 9 9 8 6   CREATININE 0.98   < > 1.12   < > 0.91 0.60* 0.73 0.79 0.70 0.74  CALCIUM 9.0  --  9.0  --  9.4  --  8.7* 8.6*  --  8.8*  MG 2.7*  --  2.2  --  2.0  --  1.8  --   --  2.0  PHOS 3.6  --   --   --  3.2  --  2.2*  --   --   --    < > =  values in this interval not displayed.    Liver Function Tests: Recent Labs  Lab 01/28/18 0542 01/29/18 0405 01/30/18 0352 01/31/18 0250  AST 84* 69* 35 22  ALT 16 17 14 13   ALKPHOS 52 58 54 58  BILITOT 2.6* 1.8* 0.8 0.6  PROT 5.9* 6.4* 5.7* 6.0*  ALBUMIN 3.6 3.5 2.9* 3.2*   No results for input(s): LIPASE, AMYLASE in the last 168 hours. No results for input(s): AMMONIA in the last 168 hours.  CBC: Recent Labs  Lab 01/27/18 1337  01/28/18 0542  01/28/18 1714  01/29/18 0405 01/29/18 1515 01/30/18 0352 01/30/18 1747 01/31/18 0250  WBC 13.4*   < > 8.6  --  15.6*  --  16.3*  --  14.5*  --  10.4  NEUTROABS 10.8*  --  6.9  --   --   --  14.9*  --  11.7*  --  8.2*  HGB 12.0*   < > 9.6*   < > 10.6*   < > 9.8* 9.5* 10.1* 9.9* 9.5*  HCT 34.9*   < > 28.3*   < > 30.4*   < > 27.6* 28.0* 29.3* 29.0* 27.5*  MCV 90.4   < > 92.5  --  91.0  --  89.6  --   90.7  --  92.0  PLT 177   < > 124*  --  144*  --  128*  --  156  --  160   < > = values in this interval not displayed.    INR: Recent Labs  Lab 01/27/18 1339 01/28/18 0634 01/29/18 0405 01/30/18 0352 01/31/18 0250  INR 1.26 1.33 1.31 1.30 2.00    Other results:  EKG:   Imaging: Dg Chest Port 1 View  Result Date: 01/31/2018 CLINICAL DATA:  Follow-up LVAD EXAM: PORTABLE CHEST 1 VIEW COMPARISON:  01/30/2018 FINDINGS: Left ventricular assist device is noted in satisfactory position. Defibrillator is again noted and stable. The right jugular sheath has been removed in the interval. Small right pleural effusion and right basilar atelectasis is seen. Left lung remains clear. Left chest tube remains in place. Mediastinal drain has been removed in the interval. Right PICC line is noted in satisfactory position. IMPRESSION: Persistent right basilar atelectasis and small effusion. Left chest tube remains in place without pneumothorax. Electronically Signed   By: Alcide Clever M.D.   On: 01/31/2018 08:21   Dg Chest Port 1 View  Result Date: 01/30/2018 CLINICAL DATA:  52 year old male postoperative day 3 LVAD. EXAM: PORTABLE CHEST 1 VIEW COMPARISON:  01/29/2018 and earlier. FINDINGS: Portable AP semi upright view at 0547 hours. Stable left chest tube and right IJ introducer sheath. Stable cardiac size and mediastinal contours. LVAD and right side AICD appears stable. Regressed bilateral pulmonary vascularity. No pneumothorax. No pleural effusion is evident. Mild retrocardiac atelectasis suspected. Negative visible bowel gas pattern. Epicardial pacer wires in place. IMPRESSION: 1. Stable postoperative changes, lines and tubes. 2. Regressed pulmonary vascularity/edema. No pneumothorax or pleural effusion. Mild atelectasis. Electronically Signed   By: Odessa Fleming M.D.   On: 01/30/2018 09:28   [place] Ekg Site Rite  Result Date: 01/30/2018 If Site Rite image not attached, placement could not be confirmed due  to current cardiac rhythm.    Medications:     Scheduled Medications: . acetaminophen  1,000 mg Oral Q6H   Or  . acetaminophen (TYLENOL) oral liquid 160 mg/5 mL  1,000 mg Per Tube Q6H  . amiodarone  200 mg Oral BID  .  aspirin EC  81 mg Oral Daily  . bisacodyl  10 mg Oral Daily   Or  . bisacodyl  10 mg Rectal Daily  . Chlorhexidine Gluconate Cloth  6 each Topical Daily  . docusate sodium  200 mg Oral Daily  . feeding supplement (ENSURE ENLIVE)  237 mL Oral BID BM  . gabapentin  300 mg Oral BID  . insulin aspart  0-24 Units Subcutaneous TID AC & HS  . mouth rinse  15 mL Mouth Rinse BID  . metoCLOPramide (REGLAN) injection  10 mg Intravenous Q6H  . pantoprazole  40 mg Oral Daily  . potassium chloride  40 mEq Oral BID  . sodium chloride flush  10-40 mL Intracatheter Q12H  . sodium chloride flush  3 mL Intravenous Q12H    Infusions: . sodium chloride Stopped (01/28/18 1600)  . sodium chloride Stopped (01/28/18 0600)  . sodium chloride 20 mL/hr at 01/31/18 1000  . ampicillin-sulbactam (UNASYN) IV 3 g (01/31/18 7943)  . furosemide (LASIX) infusion 8 mg/hr (01/31/18 1000)  . potassium chloride 10 mEq (01/31/18 0921)    PRN Medications: sodium chloride, sodium chloride, hydrALAZINE, lip balm, ondansetron (ZOFRAN) IV, oxyCODONE, traMADol   Assessment:  HeartMate 3 implantation on October 14 Preop history of nonischemic cardiomyopathy, severe MR, EF 15% Patient ICD-pacemaker, chronically paced rhythm RV function has been adequate postop, episode of atrial fibrillation treated with amiodarone Postop nonsustained VT treated with amiodarone, magnesium, milrinone weaned off.  VT resolved Plan/Discussion:   Fever has resolved and PICC line has been placed .coox remains satisfactory Coumadin now dosing per Pharm.D.  remove left pleural tube today, leave pocket drain in place until assessment tomorrow Patient now making good progress postop day 4 HeartMate 3 implantation Diet  advanced to mechanical soft because of preoperative dental extraction  I reviewed the LVAD parameters from today, and compared the results to the patient's prior recorded data.  No programming changes were made.  The LVAD is functioning within specified parameters.  The patient performs LVAD self-test daily.  LVAD interrogation was negative for any significant power changes, alarms or PI events/speed drops.  LVAD equipment check completed and is in good working order.  Back-up equipment present.   LVAD education done on emergency procedures and precautions and reviewed exit site care.  Length of Stay: [address]  Kathlee Nations Trigt III 01/31/2018, 10:16 AM

## 2018-01-31 NOTE — Progress Notes (Signed)
Physical Therapy Treatment Patient Details Name: Carl Ferguson MRN: [id number] DOB: May 10, 1965 Today's Date: 01/31/2018    History of Present Illness Pt is a 52 y.o. male admitted 01/18/18 with fever. S/p Heartmate 3 LVAD placement on 10/14. PMH includes ICD recently placed 01/03/18, systolic CHF, NICM, chronic LBBB, tobacco abuse, prior ETOH abuse, prior substance abuse, h/o of device placements with extractions due to infection.    PT Comments    Pt progressing well with mobility. Ambulatory with eva walker at supervision-level; demonstrates good ability to switch LVAD between wall<>battery power; reports increasing confidence with this. Remains limited by pain at chest tube insertion site. Pt motivated to return home and to indep PLOF. Will continue to follow acutely.   Follow Up Recommendations  Home health PT;Supervision/Assistance - 24 hour     Equipment Recommendations  3in1 (PT)(rollator)    Recommendations for Other Services       Precautions / Restrictions Precautions Precautions: Fall;Sternal Precaution Comments: Good recall of sternal precautions with mobility    Mobility  Bed Mobility Overal bed mobility: Needs Assistance Bed Mobility: Sit to Supine       Sit to supine: Supervision;HOB elevated      Transfers Overall transfer level: Needs assistance Equipment used: None Transfers: Sit to/from Stand Sit to Stand: Supervision         General transfer comment: Able to stand holding pillow to chest; no physical assist required  Ambulation/Gait Ambulation/Gait assistance: Supervision Gait Distance (Feet): 380 Feet Assistive device: (Eva walker) Gait Pattern/deviations: Step-through pattern;Decreased stride length;Trunk flexed Gait velocity: Decreased Gait velocity interpretation: 1.31 - 2.62 ft/sec, indicative of limited community ambulator General Gait Details: Slow, steady amb with eva walker; intermittent cues for upright posture. Did not require  any standing rest breaks. SpO2 >90% on RA   Stairs             Wheelchair Mobility    Modified Rankin (Stroke Patients Only)       Balance                                            Cognition Arousal/Alertness: Awake/alert Behavior During Therapy: WFL for tasks assessed/performed Overall Cognitive Status: Within Functional Limits for tasks assessed                                        Exercises      General Comments General comments (skin integrity, edema, etc.): Almost completely indep with transfer from wall<>battery power, only requiring 1x cue to double check battery's charge before switching to it      Pertinent Vitals/Pain Pain Assessment: Faces Faces Pain Scale: Hurts little more Pain Location: Chest tube insertion site Pain Descriptors / Indicators: Aching;Guarding;Grimacing Pain Intervention(s): Monitored during session;Limited activity within patient's tolerance    Home Living                      Prior Function            PT Goals (current goals can now be found in the care plan section) Acute Rehab PT Goals Patient Stated Goal: to go home PT Goal Formulation: With patient Time For Goal Achievement: 02/12/18 Potential to Achieve Goals: Good Progress towards PT goals: Progressing toward goals    Frequency  Min 3X/week      PT Plan Current plan remains appropriate    Co-evaluation              AM-PAC PT "6 Clicks" Daily Activity  Outcome Measure  Difficulty turning over in bed (including adjusting bedclothes, sheets and blankets)?: Unable Difficulty moving from lying on back to sitting on the side of the bed? : Unable Difficulty sitting down on and standing up from a chair with arms (e.g., wheelchair, bedside commode, etc,.)?: A Little Help needed moving to and from a bed to chair (including a wheelchair)?: A Little Help needed walking in hospital room?: A Little Help needed  climbing 3-5 steps with a railing? : A Little 6 Click Score: 14    End of Session Equipment Utilized During Treatment: Gait belt Activity Tolerance: Patient tolerated treatment well Patient left: in bed;with call bell/phone within reach(in bed for chest tube removal) Nurse Communication: Mobility status PT Visit Diagnosis: Unsteadiness on feet (R26.81);Muscle weakness (generalized) (M62.81)     Time: 6948-5462 PT Time Calculation (min) (ACUTE ONLY): 32 min  Charges:  $Gait Training: 8-22 mins $Therapeutic Activity: 8-22 mins                    [name], PT, DPT Acute Rehabilitation Services  Pager [phone] Office [phone]  [name] 01/31/2018, 3:37 PM

## 2018-01-31 NOTE — Progress Notes (Addendum)
Patient ID: Carl Ferguson, male   DOB: [birth year], 52 y.o.   MRN: [id number]     Advanced Heart Failure Rounding Note  PCP-Cardiologist: Garwin Brothers, MD   Subjective:    Events 10/11 RHC - Stable on milrinone 0.25 10/14 HM3 implantation; 2 units FFP. CTs and pressors in place out of OR.  10/14 pm. Speed reduced to 5600 after large UOP and many PI events.  10/15 spiked fever up to 103.5. Started on vanc/unasyn. Cxs sent.  Coox 50.7% off milrinone this am.   Tmax 98.2. BCx NGDT. Remains Vanc/Unasyn.    CVP 10-11. WBC 10.4. K 3.1.   Feeling good this am. Has already been up walking. Denies SOB. No lightheadedness or dizziness. Pain controlled with occasional breakthrough. Had BM yesterday.   Negative 1.2 L, though weight shows up slightly.   LVAD Interrogation HM 3: Speed: 5600 Flow: 4.8 PI: 3.4 Power: 4.0. No PI events   Objective:   Weight Range: 67.1 kg Body mass index is 23.17 kg/m.   Vital Signs:   Temp:  [97.1 F (36.2 C)-98.2 F (36.8 C)] 97.8 F (36.6 C) (10/18 0013) Pulse Rate:  [48-99] 48 (10/18 0600) Resp:  [0-33] 22 (10/18 0600) BP: (72-132)/(45-102) 112/65 (10/18 0600) SpO2:  [93 %-100 %] 93 % (10/18 0600) Weight:  [67.1 kg] 67.1 kg (10/18 0600) Last BM Date: 01/30/18  Weight change: Filed Weights   01/29/18 0500 01/30/18 0500 01/31/18 0600  Weight: 68 kg 66.8 kg 67.1 kg   Intake/Output:   Intake/Output Summary (Last 24 hours) at 01/31/2018 0711 Last data filed at 01/31/2018 0639 Gross per 24 hour  Intake 1686.09 ml  Output 2945 ml  Net -1258.91 ml    Physical Exam    General: NAD  HEENT: Normal. Neck: Supple, JVP 10-11 cm. Carotids OK.  Cardiac:  Mechanical heart sounds with LVAD hum present.  Lungs:  CTAB, normal effort.  Abdomen:  NT, ND, no HSM. No bruits or masses. +BS  LVAD exit site: Dressing dry and intact. No erythema or drainage. Stabilization device present and accurately applied. Driveline dressing changed daily per  sterile technique. Extremities:  Warm and dry. No cyanosis, clubbing, rash, or edema.  Neuro:  Alert & oriented x 3. Cranial nerves grossly intact. Moves all 4 extremities w/o difficulty. Affect pleasant     Telemetry   V paced 80-90s, Had short run of NSVT ~ 1645, but no prolonged Vtach, personally reviewed.   Labs    CBC Recent Labs    01/30/18 0352 01/30/18 1747 01/31/18 0250  WBC 14.5*  --  10.4  NEUTROABS 11.7*  --  8.2*  HGB 10.1* 9.9* 9.5*  HCT 29.3* 29.0* 27.5*  MCV 90.7  --  92.0  PLT 156  --  160   Basic Metabolic Panel Recent Labs    19/14/78 0405  01/30/18 0352 01/30/18 1300 01/30/18 1747 01/31/18 0250  NA 132*   < > 130* 132* 127* 129*  K 3.7   < > 3.4* 3.2* 3.0* 3.1*  CL 97*   < > 94* 93* 91* 93*  CO2 25  --  24 28  --  26  GLUCOSE 152*   < > 191* 193* 234* 212*  BUN 11   < > 9 9 8 6   CREATININE 0.91   < > 0.73 0.79 0.70 0.74  CALCIUM 9.4  --  8.7* 8.6*  --  8.8*  MG 2.0  --  1.8  --   --  2.0  PHOS 3.2  --  2.2*  --   --   --    < > = values in this interval not displayed.   Liver Function Tests Recent Labs    01/30/18 0352 01/31/18 0250  AST 35 22  ALT 14 13  ALKPHOS 54 58  BILITOT 0.8 0.6  PROT 5.7* 6.0*  ALBUMIN 2.9* 3.2*   No results for input(s): LIPASE, AMYLASE in the last 72 hours. Cardiac Enzymes No results for input(s): CKTOTAL, CKMB, CKMBINDEX, TROPONINI in the last 72 hours.  BNP: BNP (last 3 results) Recent Labs    07/02/17 1531 12/30/17 0858 01/28/18 0542  BNP 532.9* 4,087.0* 235.0*    ProBNP (last 3 results) No results for input(s): PROBNP in the last 8760 hours.   D-Dimer No results for input(s): DDIMER in the last 72 hours. Hemoglobin A1C No results for input(s): HGBA1C in the last 72 hours. Fasting Lipid Panel No results for input(s): CHOL, HDL, LDLCALC, TRIG, CHOLHDL, LDLDIRECT in the last 72 hours. Thyroid Function Tests No results for input(s): TSH, T4TOTAL, T3FREE, THYROIDAB in the last 72  hours.  Invalid input(s): FREET3  Other results:   Imaging    [place] Ekg Site Rite  Result Date: 01/30/2018 If Site Rite image not attached, placement could not be confirmed due to current cardiac rhythm.    Medications:     Scheduled Medications: . acetaminophen  1,000 mg Oral Q6H   Or  . acetaminophen (TYLENOL) oral liquid 160 mg/5 mL  1,000 mg Per Tube Q6H  . aspirin EC  81 mg Oral Daily  . bisacodyl  10 mg Oral Daily   Or  . bisacodyl  10 mg Rectal Daily  . Chlorhexidine Gluconate Cloth  6 each Topical Daily  . docusate sodium  200 mg Oral Daily  . gabapentin  300 mg Oral BID  . insulin aspart  0-24 Units Subcutaneous TID AC & HS  . lactose free nutrition  237 mL Oral TID WC  . mouth rinse  15 mL Mouth Rinse BID  . metoCLOPramide (REGLAN) injection  10 mg Intravenous Q6H  . pantoprazole  40 mg Oral Daily  . potassium chloride  20 mEq Oral Daily  . sodium chloride flush  10-40 mL Intracatheter Q12H  . sodium chloride flush  3 mL Intravenous Q12H  . warfarin  5 mg Oral q1800  . Warfarin - Physician Dosing Inpatient   Does not apply q1800    Infusions: . sodium chloride Stopped (01/28/18 1600)  . sodium chloride Stopped (01/28/18 0600)  . amiodarone 30 mg/hr (01/31/18 0600)  . ampicillin-sulbactam (UNASYN) IV 3 g (01/31/18 1610)  . furosemide (LASIX) infusion 8 mg/hr (01/31/18 0600)  . heparin 800 Units/hr (01/31/18 0645)  . nitroGLYCERIN Stopped (01/29/18 0233)  . vancomycin Stopped (01/30/18 2201)    PRN Medications: sodium chloride, hydrALAZINE, morphine injection, ondansetron (ZOFRAN) IV, oxyCODONE, traMADol  Assessment/Plan   1. Chronic Systolic CHF: S/p HM3 01/27/18.  Nonischemic cardiomyopathy, diagnosed in 2011, initially followed at [facility].  LHC (3/19) with no significant CAD.  CPX (3/19) submaximal, but suggestive of mild to moderate HF limitation. HIV negative 3/19, history of drug/ETOH abuse in the past, no drugs now and had cut back ETOH  considerably.  Echo 9/19 showed EF 20% with severe LV dilation, severe MR.  NYHA class IV symptoms. He has a wide LBBB.  RHC was done in 9/19 showing low output, CI 1.4.  He was admitted to start milrinone, unable to wean  off milrinone and remained on 0.25 mcg/kg/min at home.  Medtronic ICD was placed, he has a His bundle lead but EP was unable to get an LV lead in place => His bundle pacing does not narrow his QRS and did not help [place] wean him off milrinone.  He did well on milrinone 0.25 mcg/kg/min, NYHA class II.  RHC 10/11 shows filling pressures and cardiac output to be fairly well optimized. HM3 placed 10/14.  - Coox 50.7% off milrinone. Repeat sent.  - Volume status remains elevated at CVP 10-11. Continue  IV lasix gtt and will replace K.  - INR 2 on warfarin, stop heparin gtt.  Now on ASA 81.  2. ID: Treated with Augmentin for dental infection pre-op.   - POD#1 had fever of 103.5. Started on Vanc/Unasyn.  - Afebrile now. WBC trending down.  - ID following.  - Cultures NGTD, suspect we can stop antibiotics today. 3. ETOH Abuse:  - No longer drinking. No change.   4. Tobacco abuse: He has quit smoking since admission in 9/19.  - Continue to encourage complete cessation.  5. Mitral regurgitation: Severe functional MR.  With LV dilated > 7 cm, suspect Mitraclip would be unlikely to help him appreciably.   - Decreased MR s/p LVAD placement.   6. VT: Patient had episode of VT requiring ATP.   - Had VT for around 5 minutes 01/29/18. Asymptomatic.  - Only occasional NSVT on IV amio.  - Keep K> 4.0 Mg > 2.0.  K 3.1 today. Supp ordered. Mg 2.0.  - Transition back to po amiodarone today.  7. Hypokalemia  - K 3.1. Aggressive supp this am, and repeat BMET this afternoon.   Graciella Freer, PA-C  01/31/2018 7:11 AM   VAD Team Pager [phone] (7am - 7am) ++VAD ISSUES ONLY++   Advanced Heart Failure Team Pager [phone] (M-F; 7a - 4p)  Please contact [facility] for night-coverage  after hours (4p -7a ) and weekends on [email or web address]  Patient seen with PA, agree with the above note.  CVP 11 today.  Co-ox 50% off milrinone but while sleeping.  Feels good, walked around unit today.  Some pain at chest tube and surgical sites but controlled. Short run NSVT yesterday, remains on IV amiodarone.    On exam, JVP 10 cm, normal LVAD sounds, no edema.  Lungs clear.   Repeat co-ox today, will keep off milrinone.  Continue Lasix gtt 8 mg/hr today to get more fluid off. Replace K and repeat BMET in afternoon.   He remains on broad spectrum abx with post-op fever, currently afebrile with WBCs trending down, cultures negative so far. Think we can stop antibiotics today.   Convert amiodarone to po today.    INR 2, stop heparin gtt.   Continue to mobilize, encourage po nutrition.   CRITICAL CARE Performed by: Marca Ancona  Total critical care time: 35 minutes  Critical care time was exclusive of separately billable procedures and treating other patients.  Critical care was necessary to treat or prevent imminent or life-threatening deterioration.  Critical care was time spent personally by me on the following activities: development of treatment plan with patient and/or surrogate as well as nursing, discussions with consultants, evaluation of patient's response to treatment, examination of patient, obtaining history from patient or surrogate, ordering and performing treatments and interventions, ordering and review of laboratory studies, ordering and review of radiographic studies, pulse oximetry and re-evaluation of patient's condition.  Marca Ancona 01/31/2018 8:14 AM

## 2018-02-01 ENCOUNTER — Inpatient Hospital Stay (HOSPITAL_COMMUNITY): Payer: Medicaid Other

## 2018-02-01 DIAGNOSIS — Z95811 Presence of heart assist device: Secondary | ICD-10-CM

## 2018-02-01 DIAGNOSIS — I5043 Acute on chronic combined systolic (congestive) and diastolic (congestive) heart failure: Secondary | ICD-10-CM

## 2018-02-01 LAB — COMPREHENSIVE METABOLIC PANEL
ALT: 12 U/L (ref 0–44)
AST: 18 U/L (ref 15–41)
Albumin: 3 g/dL — ABNORMAL LOW (ref 3.5–5.0)
Alkaline Phosphatase: 56 U/L (ref 38–126)
Anion gap: 10 (ref 5–15)
BUN: 7 mg/dL (ref 6–20)
CO2: 27 mmol/L (ref 22–32)
Calcium: 8.9 mg/dL (ref 8.9–10.3)
Chloride: 93 mmol/L — ABNORMAL LOW (ref 98–111)
Creatinine, Ser: 0.81 mg/dL (ref 0.61–1.24)
GFR calc Af Amer: >60 mL/min (ref >60)
GFR calc non Af Amer: >60 mL/min (ref >60)
Glucose, Bld: 146 mg/dL — ABNORMAL HIGH (ref 70–99)
Potassium: 3.7 mmol/L (ref 3.5–5.1)
Sodium: 130 mmol/L — ABNORMAL LOW (ref 135–145)
Total Bilirubin: 0.6 mg/dL (ref 0.3–1.2)
Total Protein: 6.1 g/dL — ABNORMAL LOW (ref 6.5–8.1)

## 2018-02-01 LAB — CBC WITH DIFFERENTIAL/PLATELET
Abs Immature Granulocytes: 0.05 10*3/uL (ref 0.00–0.07)
Basophils Absolute: 0 10*3/uL (ref 0.0–0.1)
Basophils Relative: 0 %
Eosinophils Absolute: 0.2 10*3/uL (ref 0.0–0.5)
Eosinophils Relative: 2 %
HCT: 28.1 % — ABNORMAL LOW (ref 39.0–52.0)
Hemoglobin: 9.6 g/dL — ABNORMAL LOW (ref 13.0–17.0)
Immature Granulocytes: 1 %
Lymphocytes Relative: 14 %
Lymphs Abs: 1.4 10*3/uL (ref 0.7–4.0)
MCH: 31.5 pg (ref 26.0–34.0)
MCHC: 34.2 g/dL (ref 30.0–36.0)
MCV: 92.1 fL (ref 80.0–100.0)
Monocytes Absolute: 1 10*3/uL (ref 0.1–1.0)
Monocytes Relative: 10 %
Neutro Abs: 7.6 10*3/uL (ref 1.7–7.7)
Neutrophils Relative %: 73 %
Platelets: 202 10*3/uL (ref 150–400)
RBC: 3.05 MIL/uL — ABNORMAL LOW (ref 4.22–5.81)
RDW: 13.8 % (ref 11.5–15.5)
WBC: 10.3 10*3/uL (ref 4.0–10.5)
nRBC: 0 % (ref 0.0–0.2)

## 2018-02-01 LAB — GLUCOSE, CAPILLARY
GLUCOSE-CAPILLARY: 115 mg/dL — AB (ref 70–99)
Glucose-Capillary: 94 mg/dL (ref 70–99)
Glucose-Capillary: 102 mg/dL — ABNORMAL HIGH (ref 70–99)
Glucose-Capillary: 151 mg/dL — ABNORMAL HIGH (ref 70–99)

## 2018-02-01 LAB — COOXEMETRY PANEL
CARBOXYHEMOGLOBIN: 2.1 % — AB (ref 0.5–1.5)
Carboxyhemoglobin: 1.6 % — ABNORMAL HIGH (ref 0.5–1.5)
Methemoglobin: 1.5 % (ref 0.0–1.5)
Methemoglobin: 1.1 % (ref 0.0–1.5)
O2 Saturation: 54.5 %
O2 Saturation: 55.6 %
TOTAL HEMOGLOBIN: 9.8 g/dL — AB (ref 12.0–16.0)
Total hemoglobin: 10.6 g/dL — ABNORMAL LOW (ref 12.0–16.0)

## 2018-02-01 LAB — MAGNESIUM: Magnesium: 1.8 mg/dL (ref 1.7–2.4)

## 2018-02-01 LAB — PROTIME-INR
INR: 1.7
Prothrombin Time: 19.8 seconds — ABNORMAL HIGH (ref 11.4–15.2)

## 2018-02-01 LAB — LACTATE DEHYDROGENASE: LDH: 227 U/L — AB (ref 98–192)

## 2018-02-01 MED ORDER — POTASSIUM CHLORIDE CRYS ER 20 MEQ PO TBCR
20.0000 meq | EXTENDED_RELEASE_TABLET | Freq: Once | ORAL | Status: AC
  Administered 2018-02-01: 20 meq via ORAL
  Filled 2018-02-01: qty 1

## 2018-02-01 MED ORDER — FUROSEMIDE 10 MG/ML IJ SOLN: 60.0000 mg | Freq: Two times a day (BID) | INTRAMUSCULAR | Status: DC

## 2018-02-01 MED ORDER — WARFARIN SODIUM 2 MG PO TABS
2.0000 mg | ORAL_TABLET | Freq: Once | ORAL | Status: AC
  Administered 2018-02-01: 2 mg via ORAL
  Filled 2018-02-01: qty 1

## 2018-02-01 MED ORDER — FUROSEMIDE 10 MG/ML IJ SOLN
80.0000 mg | Freq: Two times a day (BID) | INTRAMUSCULAR | Status: DC
  Administered 2018-02-01 – 2018-02-06 (×11): 80 mg via INTRAVENOUS
  Filled 2018-02-01 (×13): qty 8

## 2018-02-01 MED ORDER — LOSARTAN POTASSIUM 25 MG PO TABS
25.0000 mg | ORAL_TABLET | Freq: Every day | ORAL | Status: DC
  Administered 2018-02-01 – 2018-02-03 (×3): 25 mg via ORAL
  Filled 2018-02-01 (×3): qty 1

## 2018-02-01 MED ORDER — MAGNESIUM SULFATE 2 GM/50ML IV SOLN
2.0000 g | Freq: Once | INTRAVENOUS | Status: AC
  Administered 2018-02-01: 2 g via INTRAVENOUS
  Filled 2018-02-01: qty 50

## 2018-02-01 NOTE — Progress Notes (Signed)
ANTICOAGULATION CONSULT NOTE  Pharmacy Consult for Warfarin Indication: LVAD  Allergies  Allergen Reactions  . Norflex [Orphenadrine] Swelling    FACE [ANGIOEDEMA]    Patient Measurements: Height: 5\' 7"  (170.2 cm) Weight: 150 lb 11.2 oz (68.4 kg) IBW/kg (Calculated) : 66.1  Vital Signs: Temp: 98 F (36.7 C) (10/19 0732) Temp Source: Oral (10/19 0732) BP: 80/68 (10/19 0900) Pulse Rate: 107 (10/19 0900)  Labs: Recent Labs    01/30/18 0352  01/30/18 1730 01/30/18 1741 01/30/18 1747 01/31/18 0250 01/31/18 1248 02/01/18 0425  HGB 10.1*  --   --   --  9.9* 9.5*  --  9.6*  HCT 29.3*  --   --   --  29.0* 27.5*  --  28.1*  PLT 156  --   --   --   --  160  --  202  APTT  --   --   --  55*  --  53*  --   --   LABPROT 16.1*  --   --   --   --  22.4* 24.5* 19.8*  INR 1.30  --   --   --   --  2.00 2.24 1.70  HEPARINUNFRC  --   --  <0.10*  --   --  <0.10*  --   --   CREATININE 0.73   < >  --   --  0.70 0.74 0.77 0.81   < > = values in this interval not displayed.    Estimated Creatinine Clearance: 100.9 mL/min (by C-G formula based on SCr of 0.81 mg/dL).   Medical History: Past Medical History:  Diagnosis Date  . AICD (automatic cardioverter/defibrillator) present   . CHF (congestive heart failure) (HCC)   . COPD (chronic obstructive pulmonary disease) (HCC)   . Depression   . Dyspnea   . GERD (gastroesophageal reflux disease)   . Nonischemic cardiomyopathy (HCC)    a. diagnosed in 2011 with EF 30% at that time b. EF improved to 50% by echo in 02/2016  . Pneumonia   . Tachycardia      Assessment: 22 yoM now s/p HM3 on 10/14 started on IV heparin 10/17 due to subtherapeutic INRs.   INR jump yesterday 1.3>2 so warfarin dose held last pm > 1.7 this am will restart low dose  Heparin drip stopped 10/18. Hb low but stable. No signs/symptoms of bleeding.  Goal of Therapy:  INR 2-2.5 Monitor platelets by anticoagulation protocol: Yes   Plan:  - warfarin 2mg  x1  tonight - Daily CBC and INR   Leota Sauers Pharm.D. CPP, BCPS Clinical Pharmacist [phone] 02/01/2018 9:43 AM

## 2018-02-01 NOTE — Progress Notes (Signed)
Patient ID: Carl Ferguson, male   DOB: Jan 12, 1966, 52 y.o.   MRN: [id number]     Advanced Heart Failure Rounding Note  PCP-Cardiologist: Garwin Brothers, MD   Subjective:    Events 10/11 RHC - Stable on milrinone 0.25 10/14 HM3 implantation; 2 units FFP. CTs and pressors in place out of OR.  10/14 pm. Speed reduced to 5600 after large UOP and many PI events.  10/15 spiked fever up to 103.5. Started on vanc/unasyn. Cxs sent.  Coox 54% off milrinone this am.   Afebrile with WBCs 10.9, he is now off antibiotics.     CVP 11-12 today, off Lasix gtt.  MAP 80s-90s.   Has surgical site pain. No dyspnea.  Walked with nurse already today.   LVAD Interrogation HM 3: Speed: 5600 Flow: 5.2 PI: 2.7 Power: 4.2. No PI events   Objective:   Weight Range: 68.4 kg Body mass index is 23.6 kg/m.   Vital Signs:   Temp:  [97.8 F (36.6 C)-98.6 F (37 C)] 98 F (36.7 C) (10/19 0732) Pulse Rate:  [49-113] 107 (10/19 0900) Resp:  [15-35] 28 (10/19 0900) BP: (80-123)/(68-102) 80/68 (10/19 0900) SpO2:  [90 %-100 %] 97 % (10/19 0900) Weight:  [68.4 kg] 68.4 kg (10/19 0600) Last BM Date: 01/31/18  Weight change: Filed Weights   01/30/18 0500 01/31/18 0600 02/01/18 0600  Weight: 66.8 kg 67.1 kg 68.4 kg   Intake/Output:   Intake/Output Summary (Last 24 hours) at 02/01/2018 0931 Last data filed at 02/01/2018 0900 Gross per 24 hour  Intake 1619.96 ml  Output 1995 ml  Net -375.04 ml    Physical Exam    General: Well appearing this am. NAD.  HEENT: Normal. Neck: Supple, JVP 8-9 cm. Carotids OK.  Cardiac:  Mechanical heart sounds with LVAD hum present.  Lungs:  CTAB, normal effort.  Abdomen:  NT, ND, no HSM. No bruits or masses. +BS  LVAD exit site: Well-healed and incorporated. Dressing dry and intact. No erythema or drainage. Stabilization device present and accurately applied. Driveline dressing changed daily per sterile technique. Extremities:  Warm and dry. No cyanosis, clubbing,  rash, or edema.  Neuro:  Alert & oriented x 3. Cranial nerves grossly intact. Moves all 4 extremities w/o difficulty. Affect pleasant      Telemetry   NSR with v-pacing, personally reviewed.   Labs    CBC Recent Labs    01/31/18 0250 02/01/18 0425  WBC 10.4 10.3  NEUTROABS 8.2* 7.6  HGB 9.5* 9.6*  HCT 27.5* 28.1*  MCV 92.0 92.1  PLT 160 202   Basic Metabolic Panel Recent Labs    40/98/11 0352  01/31/18 0250 01/31/18 1248 02/01/18 0425  NA 130*   < > 129* 131* 130*  K 3.4*   < > 3.1* 4.0 3.7  CL 94*   < > 93* 97* 93*  CO2 24   < > 26 26 27   GLUCOSE 191*   < > 212* 144* 146*  BUN 9   < > 6 8 7   CREATININE 0.73   < > 0.74 0.77 0.81  CALCIUM 8.7*   < > 8.8* 8.9 8.9  MG 1.8  --  2.0  --  1.8  PHOS 2.2*  --   --   --   --    < > = values in this interval not displayed.   Liver Function Tests Recent Labs    01/31/18 0250 02/01/18 0425  AST 22 18  ALT 13  12  ALKPHOS 58 56  BILITOT 0.6 0.6  PROT 6.0* 6.1*  ALBUMIN 3.2* 3.0*   No results for input(s): LIPASE, AMYLASE in the last 72 hours. Cardiac Enzymes No results for input(s): CKTOTAL, CKMB, CKMBINDEX, TROPONINI in the last 72 hours.  BNP: BNP (last 3 results) Recent Labs    07/02/17 1531 12/30/17 0858 01/28/18 0542  BNP 532.9* 4,087.0* 235.0*    ProBNP (last 3 results) No results for input(s): PROBNP in the last 8760 hours.   D-Dimer No results for input(s): DDIMER in the last 72 hours. Hemoglobin A1C No results for input(s): HGBA1C in the last 72 hours. Fasting Lipid Panel No results for input(s): CHOL, HDL, LDLCALC, TRIG, CHOLHDL, LDLDIRECT in the last 72 hours. Thyroid Function Tests No results for input(s): TSH, T4TOTAL, T3FREE, THYROIDAB in the last 72 hours.  Invalid input(s): FREET3  Other results:   Imaging    Dg Chest Port 1 View  Result Date: 02/01/2018 CLINICAL DATA:  52 year old male with a history of LVAD EXAM: PORTABLE CHEST 1 VIEW COMPARISON:  01/31/2018, 01/30/2018  FINDINGS: Cardiomediastinal silhouette unchanged. Surgical changes of median sternotomy and LVAD. Interval removal of left sided chest tube. A second sub pulmonic chest tube remains in place. Epicardial pacing leads. Unchanged position of right chest wall cardiac AICD with 3 leads visualized. Blunting of the right costophrenic angle and left costophrenic angle. Improved aeration at the right lung base. No pneumothorax. No significant interlobular septal thickening. IMPRESSION: Interval removal of left chest tube, with the second sub pulmonic chest tube unchanged, and no pneumothorax visualized. Improving aeration at the right lung base, with trace pleural fluid bilaterally. Surgical changes of median sternotomy, LVAD, right chest wall cardiac AICD. Electronically Signed   By: Gilmer Mor D.O.   On: 02/01/2018 08:43     Medications:     Scheduled Medications: . acetaminophen  1,000 mg Oral Q6H   Or  . acetaminophen (TYLENOL) oral liquid 160 mg/5 mL  1,000 mg Per Tube Q6H  . amiodarone  200 mg Oral BID  . aspirin EC  81 mg Oral Daily  . bisacodyl  10 mg Oral Daily   Or  . bisacodyl  10 mg Rectal Daily  . Chlorhexidine Gluconate Cloth  6 each Topical Daily  . docusate sodium  200 mg Oral Daily  . feeding supplement (ENSURE ENLIVE)  237 mL Oral BID BM  . gabapentin  300 mg Oral BID  . insulin aspart  0-24 Units Subcutaneous TID AC & HS  . mouth rinse  15 mL Mouth Rinse BID  . pantoprazole  40 mg Oral Daily  . potassium chloride  40 mEq Oral BID  . sodium chloride flush  10-40 mL Intracatheter Q12H  . sodium chloride flush  3 mL Intravenous Q12H  . Warfarin - Pharmacist Dosing Inpatient   Does not apply q1800    Infusions: . sodium chloride Stopped (01/28/18 1600)  . sodium chloride Stopped (01/28/18 0600)  . sodium chloride 20 mL/hr at 01/31/18 1900    PRN Medications: sodium chloride, sodium chloride, hydrALAZINE, lip balm, ondansetron (ZOFRAN) IV, oxyCODONE,  traMADol  Assessment/Plan   1. Chronic Systolic CHF: S/p HM3 01/27/18.  Nonischemic cardiomyopathy, diagnosed in 2011, initially followed at /[facility].  LHC (3/19) with no significant CAD.  CPX (3/19) submaximal, but suggestive of mild to moderate HF limitation. HIV negative 3/19, history of drug/ETOH abuse in the past, no drugs now and had cut back ETOH considerably.  Echo 9/19 showed EF 20% with  severe LV dilation, severe MR.  NYHA class IV symptoms. He has a wide LBBB.  RHC was done in 9/19 showing low output, CI 1.4.  He was admitted to start milrinone, unable to wean off milrinone and remained on 0.25 mcg/kg/min at home.  Medtronic ICD was placed, he has a His bundle lead but EP was unable to get an LV lead in place => His bundle pacing does not narrow his QRS and did not help [place] wean him off milrinone.  He did well on milrinone 0.25 mcg/kg/min, NYHA class II.  RHC 10/11 shows filling pressures and cardiac output to be fairly well optimized. HM3 placed 10/14.  Now off milrinone post-op.  Co-ox 54% this morning with CVP 11-12. He is off Lasix gtt.  - Lasix 80 mg IV bid with elevated CVP this morning. Replace Mg and K.  - Send off repeat co-ox.  - MAP high at times, will add losartan 25 mg daily.   - INR 1.7 on warfarin, increase dose.  Now on ASA 81. - Should get last chest tube out today.  - Continue walking unit.   2. ID: Treated with Augmentin for dental infection pre-op.  POD#1 had fever of 103.5. Started on Vanc/Unasyn.  Afebrile now. WBCs 10.9. Cultures NGTD and off antibiotics.  3. ETOH Abuse: No longer drinking. No change.   4. Tobacco abuse: He has quit smoking since admission in 9/19.  - Continue to encourage complete cessation.  5. Mitral regurgitation: Severe functional MR.  With LV dilated > 7 cm, suspect Mitraclip would be unlikely to help him appreciably.  Decreased MR s/p LVAD placement.   6. VT: Patient had episode of VT requiring ATP prior to admission. Had VT for around 5  minutes 01/29/18.   - Continue po amiodarone.   7. Hypokalemia: Replace K.   Marca Ancona, MD  02/01/2018 9:31 AM   VAD Team Pager [phone] (7am - 7am) ++VAD ISSUES ONLY++   Advanced Heart Failure Team Pager [phone] (M-F; 7a - 4p)  Please contact [facility] for night-coverage after hours (4p -7a ) and weekends on [email or web address]

## 2018-02-02 ENCOUNTER — Inpatient Hospital Stay (HOSPITAL_COMMUNITY): Payer: Medicaid Other

## 2018-02-02 LAB — CBC WITH DIFFERENTIAL/PLATELET
Abs Immature Granulocytes: 0.06 10*3/uL (ref 0.00–0.07)
Basophils Absolute: 0 10*3/uL (ref 0.0–0.1)
Basophils Relative: 0 %
Eosinophils Absolute: 0.2 10*3/uL (ref 0.0–0.5)
Eosinophils Relative: 1 %
HCT: 28.7 % — ABNORMAL LOW (ref 39.0–52.0)
Hemoglobin: 9.5 g/dL — ABNORMAL LOW (ref 13.0–17.0)
Immature Granulocytes: 1 %
Lymphocytes Relative: 10 %
Lymphs Abs: 1.3 10*3/uL (ref 0.7–4.0)
MCH: 30.7 pg (ref 26.0–34.0)
MCHC: 33.1 g/dL (ref 30.0–36.0)
MCV: 92.9 fL (ref 80.0–100.0)
Monocytes Absolute: 1.2 10*3/uL — ABNORMAL HIGH (ref 0.1–1.0)
Monocytes Relative: 9 %
Neutro Abs: 10.5 10*3/uL — ABNORMAL HIGH (ref 1.7–7.7)
Neutrophils Relative %: 79 %
Platelets: 265 10*3/uL (ref 150–400)
RBC: 3.09 MIL/uL — ABNORMAL LOW (ref 4.22–5.81)
RDW: 13.6 % (ref 11.5–15.5)
WBC: 13.3 10*3/uL — ABNORMAL HIGH (ref 4.0–10.5)
nRBC: 0 % (ref 0.0–0.2)

## 2018-02-02 LAB — MAGNESIUM: Magnesium: 2.1 mg/dL (ref 1.7–2.4)

## 2018-02-02 LAB — COOXEMETRY PANEL
Carboxyhemoglobin: 1.8 % — ABNORMAL HIGH (ref 0.5–1.5)
Methemoglobin: 1.6 % — ABNORMAL HIGH (ref 0.0–1.5)
O2 Saturation: 57.3 %
TOTAL HEMOGLOBIN: 10.3 g/dL — AB (ref 12.0–16.0)

## 2018-02-02 LAB — COMPREHENSIVE METABOLIC PANEL
ALT: 13 U/L (ref 0–44)
AST: 18 U/L (ref 15–41)
Albumin: 3 g/dL — ABNORMAL LOW (ref 3.5–5.0)
Alkaline Phosphatase: 59 U/L (ref 38–126)
Anion gap: 9 (ref 5–15)
BUN: 8 mg/dL (ref 6–20)
CO2: 25 mmol/L (ref 22–32)
Calcium: 8.8 mg/dL — ABNORMAL LOW (ref 8.9–10.3)
Chloride: 94 mmol/L — ABNORMAL LOW (ref 98–111)
Creatinine, Ser: 0.77 mg/dL (ref 0.61–1.24)
GFR calc Af Amer: >60 mL/min (ref >60)
GFR calc non Af Amer: >60 mL/min (ref >60)
Glucose, Bld: 134 mg/dL — ABNORMAL HIGH (ref 70–99)
Potassium: 4.1 mmol/L (ref 3.5–5.1)
Sodium: 128 mmol/L — ABNORMAL LOW (ref 135–145)
Total Bilirubin: 0.6 mg/dL (ref 0.3–1.2)
Total Protein: 6.3 g/dL — ABNORMAL LOW (ref 6.5–8.1)

## 2018-02-02 LAB — CULTURE, BLOOD (ROUTINE X 2)
Culture: NO GROWTH
Culture: NO GROWTH
Special Requests: ADEQUATE
Special Requests: ADEQUATE

## 2018-02-02 LAB — PROTIME-INR
INR: 1.61
Prothrombin Time: 18.9 seconds — ABNORMAL HIGH (ref 11.4–15.2)

## 2018-02-02 LAB — GLUCOSE, CAPILLARY
GLUCOSE-CAPILLARY: 125 mg/dL — AB (ref 70–99)
GLUCOSE-CAPILLARY: 116 mg/dL — AB (ref 70–99)
Glucose-Capillary: 111 mg/dL — ABNORMAL HIGH (ref 70–99)
Glucose-Capillary: 124 mg/dL — ABNORMAL HIGH (ref 70–99)

## 2018-02-02 LAB — C DIFFICILE QUICK SCREEN W PCR REFLEX
C DIFFICILE (CDIFF) INTERP: NOT DETECTED
C DIFFICILE (CDIFF) TOXIN: NEGATIVE
C DIFFICLE (CDIFF) ANTIGEN: NEGATIVE

## 2018-02-02 LAB — HEPARIN LEVEL (UNFRACTIONATED): Heparin Unfractionated: <0.1 IU/mL — ABNORMAL LOW (ref 0.30–0.70)

## 2018-02-02 LAB — LACTATE DEHYDROGENASE: LDH: 233 U/L — AB (ref 98–192)

## 2018-02-02 LAB — MRSA PCR SCREENING: MRSA BY PCR: NEGATIVE

## 2018-02-02 MED ORDER — ACETAMINOPHEN 325 MG PO TABS
650.0000 mg | ORAL_TABLET | Freq: Four times a day (QID) | ORAL | Status: DC | PRN
  Administered 2018-02-02 – 2018-02-10 (×25): 650 mg via ORAL
  Filled 2018-02-02 (×27): qty 2

## 2018-02-02 MED ORDER — HEPARIN (PORCINE) IN NACL 100-0.45 UNIT/ML-% IJ SOLN
1950.0000 [IU]/h | INTRAMUSCULAR | Status: DC
  Administered 2018-02-02: 900 [IU]/h via INTRAVENOUS
  Administered 2018-02-03: 1250 [IU]/h via INTRAVENOUS
  Administered 2018-02-04: 1600 [IU]/h via INTRAVENOUS
  Administered 2018-02-05: 1950 [IU]/h via INTRAVENOUS
  Filled 2018-02-02 (×5): qty 250

## 2018-02-02 MED ORDER — METOLAZONE 2.5 MG PO TABS
2.5000 mg | ORAL_TABLET | Freq: Once | ORAL | Status: AC
  Administered 2018-02-02: 2.5 mg via ORAL
  Filled 2018-02-02: qty 1

## 2018-02-02 MED ORDER — WARFARIN SODIUM 2.5 MG PO TABS
2.5000 mg | ORAL_TABLET | Freq: Once | ORAL | Status: AC
  Administered 2018-02-02: 2.5 mg via ORAL
  Filled 2018-02-02: qty 1

## 2018-02-02 NOTE — Plan of Care (Signed)
  Problem: Health Behavior/Discharge Planning: Goal: Ability to manage health-related needs will improve Outcome: Progressing   Problem: Clinical Measurements: Goal: Ability to maintain clinical measurements within normal limits will improve Outcome: Progressing Goal: Will remain free from infection Outcome: Progressing Goal: Diagnostic test results will improve Outcome: Progressing Goal: Respiratory complications will improve Outcome: Progressing Goal: Cardiovascular complication will be avoided Outcome: Progressing   Problem: Health Behavior/Discharge Planning: Goal: Ability to manage health-related needs will improve Outcome: Progressing   Problem: Education: Goal: Knowledge of the prescribed therapeutic regimen will improve Outcome: Progressing   Problem: Activity: Goal: Risk for activity intolerance will decrease Outcome: Progressing   Problem: Cardiac: Goal: Ability to maintain an adequate cardiac output will improve Outcome: Progressing   Problem: Coping: Goal: Level of anxiety will decrease Outcome: Progressing   Problem: Fluid Volume: Goal: Risk for excess fluid volume will decrease Outcome: Progressing

## 2018-02-02 NOTE — Progress Notes (Signed)
ANTICOAGULATION CONSULT NOTE  Pharmacy Consult for Warfarin Indication: LVAD  Allergies  Allergen Reactions  . Norflex [Orphenadrine] Swelling    FACE [ANGIOEDEMA]    Patient Measurements: Height: 5\' 7"  (170.2 cm) Weight: 149 lb 0.5 oz (67.6 kg) IBW/kg (Calculated) : 66.1  Vital Signs: Temp: 99 F (37.2 C) (10/20 0640) Temp Source: Oral (10/20 0640) BP: 89/77 (10/20 0700) Pulse Rate: 127 (10/20 0700)  Labs: Recent Labs    01/30/18 1730 01/30/18 1741  01/31/18 0250 01/31/18 1248 02/01/18 0425 02/02/18 0434  HGB  --   --    < > 9.5*  --  9.6* 9.5*  HCT  --   --    < > 27.5*  --  28.1* 28.7*  PLT  --   --   --  160  --  202 265  APTT  --  55*  --  53*  --   --   --   LABPROT  --   --    < > 22.4* 24.5* 19.8* 18.9*  INR  --   --    < > 2.00 2.24 1.70 1.61  HEPARINUNFRC <0.10*  --   --  <0.10*  --   --   --   CREATININE  --   --    < > 0.74 0.77 0.81 0.77   < > = values in this interval not displayed.    Estimated Creatinine Clearance: 102.1 mL/min (by C-G formula based on SCr of 0.77 mg/dL).   Medical History: Past Medical History:  Diagnosis Date  . AICD (automatic cardioverter/defibrillator) present   . CHF (congestive heart failure) (HCC)   . COPD (chronic obstructive pulmonary disease) (HCC)   . Depression   . Dyspnea   . GERD (gastroesophageal reflux disease)   . Nonischemic cardiomyopathy (HCC)    a. diagnosed in 2011 with EF 30% at that time b. EF improved to 50% by echo in 02/2016  . Pneumonia   . Tachycardia      Assessment: 28 yoM now s/p HM3 on 10/14.  INR jump yesterday 1.3>2 so warfarin dose held 10/18.  INR today down to 1.61, although rate of fall slowing. Hb low but stable. No signs/symptoms of bleeding.  Goal of Therapy:  INR 2-2.5 Monitor platelets by anticoagulation protocol: Yes   Plan:  - Initiate heparin while INR low, start at 900 units/hr. - Check heparin level in 6 hrs. - Daily heparin level and CBC. - Warfarin 2.5 mg x1  tonight - Daily CBC and INR   Jenetta Downer, [facility] Clinical Pharmacist Phone [phone]  02/02/2018 7:48 AM

## 2018-02-02 NOTE — Plan of Care (Signed)
  Problem: Health Behavior/Discharge Planning: Goal: Ability to manage health-related needs will improve Outcome: Progressing   Problem: Clinical Measurements: Goal: Ability to maintain clinical measurements within normal limits will improve Outcome: Progressing Goal: Will remain free from infection Outcome: Progressing Goal: Diagnostic test results will improve Outcome: Progressing Goal: Respiratory complications will improve Outcome: Progressing Goal: Cardiovascular complication will be avoided Outcome: Progressing   Problem: Health Behavior/Discharge Planning: Goal: Ability to manage health-related needs will improve Outcome: Progressing   Problem: Clinical Measurements: Goal: Ability to maintain clinical measurements within normal limits will improve Outcome: Progressing Goal: Will remain free from infection Outcome: Progressing Goal: Diagnostic test results will improve Outcome: Progressing Goal: Respiratory complications will improve Outcome: Progressing Goal: Cardiovascular complication will be avoided Outcome: Progressing   Problem: Elimination: Goal: Will not experience complications related to bowel motility Outcome: Progressing Goal: Will not experience complications related to urinary retention Outcome: Progressing   Problem: Skin Integrity: Goal: Risk for impaired skin integrity will decrease Outcome: Progressing   Problem: Education: Goal: Required Educational Video(s) Outcome: Progressing   Problem: Clinical Measurements: Goal: Postoperative complications will be avoided or minimized Outcome: Progressing   Problem: Skin Integrity: Goal: Demonstration of wound healing without infection will improve Outcome: Progressing   Problem: Education: Goal: Knowledge of the prescribed therapeutic regimen will improve Outcome: Progressing   Problem: Activity: Goal: Risk for activity intolerance will decrease Outcome: Progressing   Problem:  Cardiac: Goal: Ability to maintain an adequate cardiac output will improve Outcome: Progressing   Problem: Coping: Goal: Level of anxiety will decrease Outcome: Progressing   Problem: Fluid Volume: Goal: Risk for excess fluid volume will decrease Outcome: Progressing   Problem: Clinical Measurements: Goal: Ability to maintain clinical measurements within normal limits will improve Outcome: Progressing Goal: Will remain free from infection Outcome: Progressing   Problem: Respiratory: Goal: Will regain and/or maintain adequate ventilation Outcome: Progressing

## 2018-02-02 NOTE — Progress Notes (Signed)
ANTICOAGULATION CONSULT NOTE  Pharmacy Consult for Warfarin Indication: LVAD  Allergies  Allergen Reactions  . Norflex [Orphenadrine] Swelling    FACE [ANGIOEDEMA]    Patient Measurements: Height: 5\' 7"  (170.2 cm) Weight: 149 lb 0.5 oz (67.6 kg) IBW/kg (Calculated) : 66.1  Vital Signs: Temp: 98.4 F (36.9 C) (10/20 1620) Temp Source: Oral (10/20 1620) BP: 99/82 (10/20 1700) Pulse Rate: 115 (10/20 1700)  Labs: Recent Labs    01/30/18 1730 01/30/18 1741  01/31/18 0250 01/31/18 1248 02/01/18 0425 02/02/18 0434 02/02/18 1630  HGB  --   --    < > 9.5*  --  9.6* 9.5*  --   HCT  --   --    < > 27.5*  --  28.1* 28.7*  --   PLT  --   --   --  160  --  202 265  --   APTT  --  55*  --  53*  --   --   --   --   LABPROT  --   --    < > 22.4* 24.5* 19.8* 18.9*  --   INR  --   --    < > 2.00 2.24 1.70 1.61  --   HEPARINUNFRC <0.10*  --   --  <0.10*  --   --   --  <0.10*  CREATININE  --   --    < > 0.74 0.77 0.81 0.77  --    < > = values in this interval not displayed.    Estimated Creatinine Clearance: 102.1 mL/min (by C-G formula based on SCr of 0.77 mg/dL).   Medical History: Past Medical History:  Diagnosis Date  . AICD (automatic cardioverter/defibrillator) present   . CHF (congestive heart failure) (HCC)   . COPD (chronic obstructive pulmonary disease) (HCC)   . Depression   . Dyspnea   . GERD (gastroesophageal reflux disease)   . Nonischemic cardiomyopathy (HCC)    a. diagnosed in 2011 with EF 30% at that time b. EF improved to 50% by echo in 02/2016  . Pneumonia   . Tachycardia     Assessment: 49 yoM now s/p HM3 on 10/14.  INR jump yesterday 1.3>2 so warfarin dose held 10/18.  INR today down to 1.61, although rate of fall slowing.   Initial heparin level came back undetectable, on 900 units/hr. Hgb 9.5, plt 265. No s/sx of bleeding. No infusion issues.   Goal of Therapy:  INR 2-2.5 Heparin level: 0.3-0.5  Monitor platelets by anticoagulation protocol:  Yes   Plan:  - Increase heparin infusion to 1100 units/hr. - Check heparin level in 6 hrs. - Daily heparin level and CBC. - Warfarin 2.5 mg x1 tonight - Daily CBC and INR   Girard Cooter, PharmD Clinical Pharmacist  Pager: [phone] Phone: [phone]  02/02/2018 5:22 PM

## 2018-02-02 NOTE — Progress Notes (Signed)
Patient ID: Carl Ferguson, male   DOB: 04/10/1966, 52 y.o.   MRN: [id number]     Advanced Heart Failure Rounding Note  PCP-Cardiologist: Garwin Brothers, MD   Subjective:    Events 10/11 RHC - Stable on milrinone 0.25 10/14 HM3 implantation; 2 units FFP. CTs and pressors in place out of OR.  10/14 pm. Speed reduced to 5600 after large UOP and many PI events.  10/15 spiked fever up to 103.5. Started on vanc/unasyn. Cxs sent.  Coox 57% off milrinone this am.   Afebrile but WBCs higher at 13 today, he is now off antibiotics.  Having some loose stools.   CVP 15 today, weight down 1 lb.  MAP generally 80s..   Mild headache today, some dyspnea overnight and now wearing oxygen.   LVAD Interrogation HM 3: Speed: 5600 Flow: 4.7 PI: 4.7 Power: 4.2. No PI events   Objective:   Weight Range: 67.6 kg Body mass index is 23.34 kg/m.   Vital Signs:   Temp:  [98.5 F (36.9 C)-99.2 F (37.3 C)] 99 F (37.2 C) (10/20 0805) Pulse Rate:  [36-151] 127 (10/20 0700) Resp:  [18-37] 25 (10/20 0700) BP: (73-112)/(48-89) 89/77 (10/20 0700) SpO2:  [91 %-98 %] 98 % (10/20 0700) Weight:  [67.6 kg] 67.6 kg (10/20 0457) Last BM Date: 02/01/18  Weight change: Filed Weights   01/31/18 0600 02/01/18 0600 02/02/18 0457  Weight: 67.1 kg 68.4 kg 67.6 kg   Intake/Output:   Intake/Output Summary (Last 24 hours) at 02/02/2018 0919 Last data filed at 02/02/2018 0905 Gross per 24 hour  Intake 2032.05 ml  Output 2460 ml  Net -427.95 ml    Physical Exam    General: Well appearing this am. NAD.  HEENT: Normal. Neck: Supple, JVP 10-12 cm. Carotids OK.  Cardiac:  Mechanical heart sounds with LVAD hum present.  Lungs:  CTAB, normal effort.  Abdomen:  NT, ND, no HSM. No bruits or masses. +BS  LVAD exit site: Well-healed and incorporated. Dressing dry and intact. No erythema or drainage. Stabilization device present and accurately applied. Driveline dressing changed daily per sterile  technique. Extremities:  Warm and dry. No cyanosis, clubbing, rash, or edema.  Neuro:  Alert & oriented x 3. Cranial nerves grossly intact. Moves all 4 extremities w/o difficulty. Affect pleasant    Telemetry   Sinus tachy 100s with v-pacing, personally reviewed.   Labs    CBC Recent Labs    02/01/18 0425 02/02/18 0434  WBC 10.3 13.3*  NEUTROABS 7.6 10.5*  HGB 9.6* 9.5*  HCT 28.1* 28.7*  MCV 92.1 92.9  PLT 202 265   Basic Metabolic Panel Recent Labs    82/95/62 0425 02/02/18 0434  NA 130* 128*  K 3.7 4.1  CL 93* 94*  CO2 27 25  GLUCOSE 146* 134*  BUN 7 8  CREATININE 0.81 0.77  CALCIUM 8.9 8.8*  MG 1.8 2.1   Liver Function Tests Recent Labs    02/01/18 0425 02/02/18 0434  AST 18 18  ALT 12 13  ALKPHOS 56 59  BILITOT 0.6 0.6  PROT 6.1* 6.3*  ALBUMIN 3.0* 3.0*   No results for input(s): LIPASE, AMYLASE in the last 72 hours. Cardiac Enzymes No results for input(s): CKTOTAL, CKMB, CKMBINDEX, TROPONINI in the last 72 hours.  BNP: BNP (last 3 results) Recent Labs    07/02/17 1531 12/30/17 0858 01/28/18 0542  BNP 532.9* 4,087.0* 235.0*    ProBNP (last 3 results) No results for input(s): PROBNP in  the last 8760 hours.   D-Dimer No results for input(s): DDIMER in the last 72 hours. Hemoglobin A1C No results for input(s): HGBA1C in the last 72 hours. Fasting Lipid Panel No results for input(s): CHOL, HDL, LDLCALC, TRIG, CHOLHDL, LDLDIRECT in the last 72 hours. Thyroid Function Tests No results for input(s): TSH, T4TOTAL, T3FREE, THYROIDAB in the last 72 hours.  Invalid input(s): FREET3  Other results:   Imaging    No results found.   Medications:     Scheduled Medications: . amiodarone  200 mg Oral BID  . aspirin EC  81 mg Oral Daily  . bisacodyl  10 mg Oral Daily   Or  . bisacodyl  10 mg Rectal Daily  . Chlorhexidine Gluconate Cloth  6 each Topical Daily  . docusate sodium  200 mg Oral Daily  . feeding supplement (ENSURE ENLIVE)   237 mL Oral BID BM  . furosemide  80 mg Intravenous BID  . gabapentin  300 mg Oral BID  . insulin aspart  0-24 Units Subcutaneous TID AC & HS  . losartan  25 mg Oral Daily  . mouth rinse  15 mL Mouth Rinse BID  . metolazone  2.5 mg Oral Once  . pantoprazole  40 mg Oral Daily  . potassium chloride  40 mEq Oral BID  . sodium chloride flush  10-40 mL Intracatheter Q12H  . sodium chloride flush  3 mL Intravenous Q12H  . warfarin  2.5 mg Oral ONCE-1800  . Warfarin - Pharmacist Dosing Inpatient   Does not apply q1800    Infusions: . sodium chloride 10 mL/hr at 02/02/18 0648  . heparin 900 Units/hr (02/02/18 0905)    PRN Medications: sodium chloride, acetaminophen, hydrALAZINE, lip balm, ondansetron (ZOFRAN) IV, oxyCODONE, traMADol  Assessment/Plan   1. Chronic Systolic CHF: S/p HM3 01/27/18.  Nonischemic cardiomyopathy, diagnosed in 2011, initially followed at [facility].  LHC (3/19) with no significant CAD.  CPX (3/19) submaximal, but suggestive of mild to moderate HF limitation. HIV negative 3/19, history of drug/ETOH abuse in the past, no drugs now and had cut back ETOH considerably.  Echo 9/19 showed EF 20% with severe LV dilation, severe MR.  NYHA class IV symptoms. He has a wide LBBB.  RHC was done in 9/19 showing low output, CI 1.4.  He was admitted to start milrinone, unable to wean off milrinone and remained on 0.25 mcg/kg/min at home.  Medtronic ICD was placed, he has a His bundle lead but EP was unable to get an LV lead in place => His bundle pacing does not narrow his QRS and did not help [place] wean him off milrinone.  He did well on milrinone 0.25 mcg/kg/min, NYHA class II.  RHC 10/11 shows filling pressures and cardiac output to be fairly well optimized. HM3 placed 10/14.  Now off milrinone post-op.  Co-ox 57% this morning with CVP higher at 15.  - Lasix 80 mg IV bid and will give a dose of metolazone 2.5 x 1.   - Continue losartan 25 mg daily.   - INR 1.6 on warfarin, increase  dose and will cover with heparin gtt until INR > 1.8 again.  Now on ASA 81. - Continue walking unit.   2. ID: Treated with Augmentin for dental infection pre-op.  POD#1 had fever of 103.5. Started on Vanc/Unasyn.  Afebrile now. WBCs a little higher at 13. Cultures NGTD and off antibiotics.  He has had episodes of loose stool. - Will check for C difficile.  3. ETOH Abuse: No longer drinking. No change.   4. Tobacco abuse: He has quit smoking since admission in 9/19.  - Continue to encourage complete cessation.  5. Mitral regurgitation: Severe functional MR.  With LV dilated > 7 cm, suspect Mitraclip would be unlikely to help him appreciably.  Decreased MR s/p LVAD placement.   6. VT: Patient had episode of VT requiring ATP prior to admission. Had VT for around 5 minutes 01/29/18.   - Continue po amiodarone.   7. Hypokalemia: Resolved.  8. Hyponatremia: Na lower 128, will fluid restrict for now.  If drops further would give tolvaptan to help with diuresis.   CRITICAL CARE Performed by: Marca Ancona  Total critical care time: 35 minutes  Critical care time was exclusive of separately billable procedures and treating other patients.  Critical care was necessary to treat or prevent imminent or life-threatening deterioration.  Critical care was time spent personally by me on the following activities: development of treatment plan with patient and/or surrogate as well as nursing, discussions with consultants, evaluation of patient's response to treatment, examination of patient, obtaining history from patient or surrogate, ordering and performing treatments and interventions, ordering and review of laboratory studies, ordering and review of radiographic studies, pulse oximetry and re-evaluation of patient's condition.   Marca Ancona, MD  02/02/2018 9:19 AM   VAD Team Pager [phone] (7am - 7am) ++VAD ISSUES ONLY++   Advanced Heart Failure Team Pager [phone] (M-F; 7a - 4p)  Please  contact [facility] for night-coverage after hours (4p -7a ) and weekends on [email or web address]

## 2018-02-03 LAB — CBC WITH DIFFERENTIAL/PLATELET
Abs Immature Granulocytes: 0.07 10*3/uL (ref 0.00–0.07)
Basophils Absolute: 0 10*3/uL (ref 0.0–0.1)
Basophils Relative: 0 %
Eosinophils Absolute: 0.4 10*3/uL (ref 0.0–0.5)
Eosinophils Relative: 3 %
HCT: 29.7 % — ABNORMAL LOW (ref 39.0–52.0)
Hemoglobin: 10 g/dL — ABNORMAL LOW (ref 13.0–17.0)
Immature Granulocytes: 1 %
Lymphocytes Relative: 12 %
Lymphs Abs: 1.6 10*3/uL (ref 0.7–4.0)
MCH: 30.9 pg (ref 26.0–34.0)
MCHC: 33.7 g/dL (ref 30.0–36.0)
MCV: 91.7 fL (ref 80.0–100.0)
Monocytes Absolute: 1.5 10*3/uL — ABNORMAL HIGH (ref 0.1–1.0)
Monocytes Relative: 11 %
Neutro Abs: 9.7 10*3/uL — ABNORMAL HIGH (ref 1.7–7.7)
Neutrophils Relative %: 73 %
Platelets: 302 10*3/uL (ref 150–400)
RBC: 3.24 MIL/uL — ABNORMAL LOW (ref 4.22–5.81)
RDW: 13.3 % (ref 11.5–15.5)
WBC: 13.3 10*3/uL — ABNORMAL HIGH (ref 4.0–10.5)
nRBC: 0 % (ref 0.0–0.2)

## 2018-02-03 LAB — COOXEMETRY PANEL
CARBOXYHEMOGLOBIN: 2 % — AB (ref 0.5–1.5)
Carboxyhemoglobin: 2.3 % — ABNORMAL HIGH (ref 0.5–1.5)
METHEMOGLOBIN: 1.4 % (ref 0.0–1.5)
Methemoglobin: 1.6 % — ABNORMAL HIGH (ref 0.0–1.5)
O2 SAT: 58.4 %
O2 Saturation: 51.7 %
TOTAL HEMOGLOBIN: 10.5 g/dL — AB (ref 12.0–16.0)
Total hemoglobin: 10.2 g/dL — ABNORMAL LOW (ref 12.0–16.0)

## 2018-02-03 LAB — COMPREHENSIVE METABOLIC PANEL
ALT: 12 U/L (ref 0–44)
AST: 19 U/L (ref 15–41)
Albumin: 3.1 g/dL — ABNORMAL LOW (ref 3.5–5.0)
Alkaline Phosphatase: 64 U/L (ref 38–126)
Anion gap: 9 (ref 5–15)
BUN: 8 mg/dL (ref 6–20)
CO2: 28 mmol/L (ref 22–32)
Calcium: 9.1 mg/dL (ref 8.9–10.3)
Chloride: 91 mmol/L — ABNORMAL LOW (ref 98–111)
Creatinine, Ser: 0.79 mg/dL (ref 0.61–1.24)
GFR calc Af Amer: >60 mL/min (ref >60)
GFR calc non Af Amer: >60 mL/min (ref >60)
Glucose, Bld: 108 mg/dL — ABNORMAL HIGH (ref 70–99)
Potassium: 3.9 mmol/L (ref 3.5–5.1)
Sodium: 128 mmol/L — ABNORMAL LOW (ref 135–145)
Total Bilirubin: 0.9 mg/dL (ref 0.3–1.2)
Total Protein: 6.9 g/dL (ref 6.5–8.1)

## 2018-02-03 LAB — GLUCOSE, CAPILLARY
GLUCOSE-CAPILLARY: 117 mg/dL — AB (ref 70–99)
Glucose-Capillary: 139 mg/dL — ABNORMAL HIGH (ref 70–99)
Glucose-Capillary: 146 mg/dL — ABNORMAL HIGH (ref 70–99)

## 2018-02-03 LAB — BRAIN NATRIURETIC PEPTIDE: B Natriuretic Peptide: 549.1 pg/mL — ABNORMAL HIGH (ref 0.0–100.0)

## 2018-02-03 LAB — HEPARIN LEVEL (UNFRACTIONATED): Heparin Unfractionated: <0.1 IU/mL — ABNORMAL LOW (ref 0.30–0.70)

## 2018-02-03 LAB — MAGNESIUM: Magnesium: 2 mg/dL (ref 1.7–2.4)

## 2018-02-03 LAB — PROTIME-INR
INR: 1.38
Prothrombin Time: 16.8 seconds — ABNORMAL HIGH (ref 11.4–15.2)

## 2018-02-03 LAB — LACTATE DEHYDROGENASE: LDH: 242 U/L — AB (ref 98–192)

## 2018-02-03 MED ORDER — GABAPENTIN 400 MG PO CAPS
400.0000 mg | ORAL_CAPSULE | Freq: Two times a day (BID) | ORAL | Status: DC
  Administered 2018-02-03 – 2018-02-10 (×15): 400 mg via ORAL
  Filled 2018-02-03 (×15): qty 1

## 2018-02-03 MED ORDER — LOSARTAN POTASSIUM 25 MG PO TABS
25.0000 mg | ORAL_TABLET | Freq: Two times a day (BID) | ORAL | Status: DC
  Administered 2018-02-03 – 2018-02-06 (×6): 25 mg via ORAL
  Filled 2018-02-03 (×6): qty 1

## 2018-02-03 MED ORDER — GABAPENTIN 800 MG PO TABS
400.0000 mg | ORAL_TABLET | Freq: Two times a day (BID) | ORAL | Status: DC
  Filled 2018-02-03 (×2): qty 0.5

## 2018-02-03 MED ORDER — METOLAZONE 2.5 MG PO TABS
2.5000 mg | ORAL_TABLET | Freq: Once | ORAL | Status: AC
  Administered 2018-02-03: 2.5 mg via ORAL
  Filled 2018-02-03: qty 1

## 2018-02-03 MED ORDER — ENSURE ENLIVE PO LIQD
237.0000 mL | Freq: Three times a day (TID) | ORAL | Status: DC
  Administered 2018-02-03 – 2018-02-06 (×7): 237 mL via ORAL

## 2018-02-03 MED ORDER — WARFARIN SODIUM 4 MG PO TABS
4.0000 mg | ORAL_TABLET | Freq: Once | ORAL | Status: AC
  Administered 2018-02-03: 4 mg via ORAL
  Filled 2018-02-03: qty 1

## 2018-02-03 NOTE — Progress Notes (Signed)
ANTICOAGULATION CONSULT NOTE - Follow Up Consult  Pharmacy Consult for heparin Indication: LVAD  Labs: Recent Labs    01/31/18 0250 01/31/18 1248 02/01/18 0425 02/02/18 0434 02/02/18 1630 02/03/18 0026  HGB 9.5*  --  9.6* 9.5*  --   --   HCT 27.5*  --  28.1* 28.7*  --   --   PLT 160  --  202 265  --   --   APTT 53*  --   --   --   --   --   LABPROT 22.4* 24.5* 19.8* 18.9*  --   --   INR 2.00 2.24 1.70 1.61  --   --   HEPARINUNFRC <0.10*  --   --   --  <0.10* <0.10*  CREATININE 0.74 0.77 0.81 0.77  --   --     Assessment: 52yo male remains undetectable on heparin despite rate changes; RN reports no gtt issues or signs of bleeding.  Goal of Therapy:  Heparin level 0.3-0.5 units/ml   Plan:  Will increase heparin gtt by 2-3 units/kg/hr to 1250 units/hr and check level in 6 hours.    Vernard Gambles, PharmD, BCPS  02/03/2018,2:07 AM

## 2018-02-03 NOTE — Progress Notes (Signed)
LVAD Coordinator Rounding Note:  Admitted 01/18/18 due to fever and chills. Started on broad spectrum antibiotics. Teeth extractions done by Dr. Kristin Bruins 01/20/18. RHC done 01/24/18.   HM III LVAD implanted on 01/27/18 by Dr. Donata Clay under Destination Therapy criteria.  Patient sitting up in chair. He says he walked the entire unit this morning. He says his pain is a lot better now that all of his chest tubes are out. He is excited that he has been put in for transfer orders to [facility].    Vital signs: Temp:  HR: 97 AV-paced Cuff BP:  115/73 (85) Doppler: not done O2 Sat: 95% on RA Wt: 137.5>158.7>149.9>147.2>147.9>144.9 lbs   LVAD interrogation reveals:  Speed: 5600 Flow: 4.9 Power: 4.3w PI: 3.0 Alarms: none Events: none Hematocrit: 30  Fixed speed: 5600 Low speed limit: 5300   Drive Line:  Left lower abdominal dressing clean, dry, intact. Will change later this afternoon when patient is back in bed.    Labs:  LDH trend: 282>324>263>262>472   INR trend: 1.26>1.33>1.31>1.30>2.0>2.71  WBC trend: 8.6>15.6>16.3>14.5>10.4>11.1  Anticoagulation Plan: -INR Goal:  2.0 - 2.5 -ASA Dose: ASA 324mg  daily until INR therapeutic  Blood Products:  -Intraop: 4 FFP; 450 cell saver   - Post op:  01/27/18>>1 PRBC  Device: - Medtronic ICD - Pacing: DDD 60 - Therapies: off currently  Drips: Milrinone 0.53mcg/kg/min--off 01/30/18 Amiodarone 30mg /hr--restarted 01/30/18--transitioned to PO 01/31/18 Lasix 8mg /hr--off Heparin--started 01/30/18--off 01/31/18  Arrythmias: 01/29/18- VT for roughly 5 minutes.  Amio bolus given.  Respiratory: extubated 01/28/18   Infection: Blood/urine/sputum cultures ordered due to fever 101.3.  - 01/28/18 BC's>> NTD -  01/28/18 urine cultures>>NTD  Adverse Events on VAD: -   VAD Education:  1. Per bedside RN report, patient was able to make power transitions without prompting this morning for walk.  2. Asked when girlfriend Hermelinda Dellen would be  available for dressing change teaching. Per patient, she is on "2nd shift" today and she would not be by until late this evening.     Plan/Recommendations:  1. Daily dressing changes per VAD RN, nurse champion, or trained caregiver.  2. Call VAD pager with any equipment or drive line questions, or needs.    [name] RN VAD Coordinator  Office: [phone]  24/7 Pager: [phone]

## 2018-02-03 NOTE — Progress Notes (Addendum)
HeartMate 3 Rounding Note  HM3 Implant 10-14 for DT indication Subjective:   Nonischemic cardiomyopathy, EF 15%, inotrope dependent requiring hospitalization prior to implantation  Patient with adequate RV  function, extubated postop day 1,mobilized OOB to chair  Patient developed postop fever, probably inflammatory response to cardiopulmonary bypass.  Now improved.   empiric Unasyn and vancomycin n stopped after negative cultures and resolution of fever  Postop tachycardia resolved now on oral amiodarone Postoperative fluid overload significantly improved with Lasix drip, now on IV dosing All chest drains have been removed, chest x-ray appears clear Patient ambulating in hallway and ready for transfer to stepdown on unit to see  Postop INR has dipped and he is now on a heparin bridge managed by pharmacy   VAD parameters all satisfactory with VAD flows almost 5 L/min  LVAD INTERROGATION:  HeartMate 3 LVAD:  Flow 4.7 L/min  liters/min, speed 5600 rpm, power 4.1, PI 3.2.  Controller connected.   Objective:    Vital Signs:   Temp:  [97.9 F (36.6 C)-98.8 F (37.1 C)] 98.8 F (37.1 C) (10/21 0700) Pulse Rate:  [27-120] 65 (10/21 0900) Resp:  [15-33] 21 (10/21 0900) BP: (81-130)/(56-105) 115/73 (10/21 0900) SpO2:  [93 %-98 %] 95 % (10/21 0900) Weight:  [65.9 kg] 65.9 kg (10/21 0600) Last BM Date: 02/02/18 Mean arterial Pressure 75-85 mm Hg  Intake/Output:   Intake/Output Summary (Last 24 hours) at 02/03/2018 1008 Last data filed at 02/03/2018 0800 Gross per 24 hour  Intake 1164.5 ml  Output 3525 ml  Net -2360.5 ml     Physical Exam: General:  Well appearing. No resp difficulty HEENT: normal Neck: supple. JVP . Carotids 2+ bilat; no bruits. No lymphadenopathy or thryomegaly appreciated. Cor: Mechanical heart sounds with LVAD hum present. Lungs: clear Abdomen: soft, nontender, nondistended. No hepatosplenomegaly. No bruits or masses. Good bowel sounds. Extremities: no  cyanosis, clubbing, rash, edema Neuro: alert & orientedx3, cranial nerves grossly intact. moves all 4 extremities w/o difficulty. Affect pleasant  Telemetry: av pacing  Labs: Basic Metabolic Panel: Recent Labs  Lab 01/28/18 0542  01/29/18 0405  01/30/18 0352  01/31/18 0250 01/31/18 1248 02/01/18 0425 02/02/18 0434 02/03/18 0420  NA 135   < > 132*   < > 130*   < > 129* 131* 130* 128* 128*  K 4.4   < > 3.7   < > 3.4*   < > 3.1* 4.0 3.7 4.1 3.9  CL 100   < > 97*   < > 94*   < > 93* 97* 93* 94* 91*  CO2 25   < > 25  --  24   < > 26 26 27 25 28   GLUCOSE 133*   < > 152*   < > 191*   < > 212* 144* 146* 134* 108*  BUN 14   < > 11   < > 9   < > 6 8 7 8 8   CREATININE 0.98   < > 0.91   < > 0.73   < > 0.74 0.77 0.81 0.77 0.79  CALCIUM 9.0   < > 9.4  --  8.7*   < > 8.8* 8.9 8.9 8.8* 9.1  MG 2.7*   < > 2.0  --  1.8  --  2.0  --  1.8 2.1 2.0  PHOS 3.6  --  3.2  --  2.2*  --   --   --   --   --   --    < > =  values in this interval not displayed.    Liver Function Tests: Recent Labs  Lab 01/30/18 0352 01/31/18 0250 02/01/18 0425 02/02/18 0434 02/03/18 0420  AST 35 22 18 18 19   ALT 14 13 12 13 12   ALKPHOS 54 58 56 59 64  BILITOT 0.8 0.6 0.6 0.6 0.9  PROT 5.7* 6.0* 6.1* 6.3* 6.9  ALBUMIN 2.9* 3.2* 3.0* 3.0* 3.1*   No results for input(s): LIPASE, AMYLASE in the last 168 hours. No results for input(s): AMMONIA in the last 168 hours.  CBC: Recent Labs  Lab 01/30/18 0352 01/30/18 1747 01/31/18 0250 02/01/18 0425 02/02/18 0434 02/03/18 0420  WBC 14.5*  --  10.4 10.3 13.3* 13.3*  NEUTROABS 11.7*  --  8.2* 7.6 10.5* 9.7*  HGB 10.1* 9.9* 9.5* 9.6* 9.5* 10.0*  HCT 29.3* 29.0* 27.5* 28.1* 28.7* 29.7*  MCV 90.7  --  92.0 92.1 92.9 91.7  PLT 156  --  160 202 265 302    INR: Recent Labs  Lab 01/31/18 0250 01/31/18 1248 02/01/18 0425 02/02/18 0434 02/03/18 0837  INR 2.00 2.24 1.70 1.61 1.38    Other results:  EKG:   Imaging: Dg Chest Port 1 View  Result Date:  02/02/2018 CLINICAL DATA:  LVAD EXAM: PORTABLE CHEST 1 VIEW COMPARISON:  Yesterday FINDINGS: Chronic cardiopericardial enlargement. ICD/pacer leads from the right in stable position. Partially visualized LVAD. Hazy lower chest opacities likely atelectasis and pleural fluid. No pneumothorax. IMPRESSION: Hazy opacities at the bases, similar to prior, likely atelectasis and pleural fluid. Electronically Signed   By: Marnee Spring M.D.   On: 02/02/2018 14:21     Medications:     Scheduled Medications: . amiodarone  200 mg Oral BID  . aspirin EC  81 mg Oral Daily  . bisacodyl  10 mg Oral Daily   Or  . bisacodyl  10 mg Rectal Daily  . Chlorhexidine Gluconate Cloth  6 each Topical Daily  . docusate sodium  200 mg Oral Daily  . feeding supplement (ENSURE ENLIVE)  237 mL Oral TID BM  . furosemide  80 mg Intravenous BID  . gabapentin  400 mg Oral BID  . insulin aspart  0-24 Units Subcutaneous TID AC & HS  . losartan  25 mg Oral Daily  . mouth rinse  15 mL Mouth Rinse BID  . pantoprazole  40 mg Oral Daily  . potassium chloride  40 mEq Oral BID  . sodium chloride flush  10-40 mL Intracatheter Q12H  . sodium chloride flush  3 mL Intravenous Q12H  . warfarin  4 mg Oral ONCE-1800  . Warfarin - Pharmacist Dosing Inpatient   Does not apply q1800    Infusions: . sodium chloride 10 mL/hr at 02/03/18 0800  . heparin 1,250 Units/hr (02/03/18 0800)    PRN Medications: sodium chloride, acetaminophen, hydrALAZINE, lip balm, ondansetron (ZOFRAN) IV, oxyCODONE, traMADol   Assessment:  HeartMate 3 implantation on October 14 Preop history of nonischemic cardiomyopathy, severe MR, EF 15% Patient ICD-pacemaker, chronically paced rhythm RV function has been adequate postop, episode of atrial fibrillation treated with amiodarone Postop nonsustained VT treated with amiodarone, magnesium, milrinone weaned off.  VT resolved Plan/Discussion:   Fever has resolved and PICC line has been placed .coox  remains satisfactory off milrinone Coumadin now dosing per Pharm.D. We will remove epicardial pacing wires after patient off IV heparin and INR < 2.5 Patient transferred to stepdown on unit 2 Central I reviewed the LVAD parameters from today, and compared the results to  the patient's prior recorded data.  No programming changes were made.  The LVAD is functioning within specified parameters.  The patient performs LVAD self-test daily.  LVAD interrogation was negative for any significant power changes, alarms or PI events/speed drops.  LVAD equipment check completed and is in good working order.  Back-up equipment present.   LVAD education done on emergency procedures and precautions and reviewed exit site care.  Length of Stay: [address]  Kathlee Nations Trigt III 02/03/2018, 10:08 AM

## 2018-02-03 NOTE — Progress Notes (Signed)
CSW met with patient at bedside. Patient shared continued recovery with LVAD and interested in attending the LVAD Support Group tonight. Patient shared some concerns regarding housing plans and frustrations with his family. Patient hopes to have some resolution and confirm plans with CSW tomorrow. CSW will continue to follow for supportive needs throughout implant hospitalization. Raquel Sarna, [place], [place]

## 2018-02-03 NOTE — Progress Notes (Signed)
Occupational Therapy Treatment Patient Details Name: Carl Ferguson MRN: [id number] DOB: 02-Nov-1965 Today's Date: 02/03/2018    History of present illness Pt is a 52 y.o. male admitted 01/18/18 with fever. S/p Heartmate 3 LVAD placement on 10/14. PMH includes ICD recently placed 01/03/18, systolic CHF, NICM, chronic LBBB, tobacco abuse, prior ETOH abuse, prior substance abuse, h/o of device placements with extractions due to infection.    OT comments  Pt is making great progress since eval. He is able to independently change from wall to battery power, can tell me what items he needs to take with him when he leaves home, he is following sternal precautions without cues and moving at independent to S level. We will continue to follow.  Follow Up Recommendations  No OT follow up    Equipment Recommendations  3 in 1 bedside commode       Precautions / Restrictions Precautions Precautions: Fall;Sternal Precaution Comments: Good recall of sternal precautions with mobility Restrictions Weight Bearing Restrictions: Yes(sternal)       Mobility Bed Mobility Overal bed mobility: Independent Bed Mobility: Sit to Supine       Sit to supine: HOB elevated;Independent   General bed mobility comments: no physical assist or cues required  Transfers Overall transfer level: Needs assistance Equipment used: None Transfers: Sit to/from Stand Sit to Stand: Independent         General transfer comment: no physical assist or cues required        ADL either performed or assessed with clinical judgement   ADL Overall ADL's : Needs assistance/impaired                     Lower Body Dressing: Set up Lower Body Dressing Details (indicate cue type and reason): Independent sit<>stand Toilet Transfer: Supervision/safety;Ambulation Toilet Transfer Details (indicate cue type and reason): No AD Toileting- Clothing Manipulation and Hygiene: Supervision/safety;Sit to/from stand               Vision Baseline Vision/History: Wears glasses Patient Visual Report: No change from baseline            Cognition Arousal/Alertness: Awake/alert Behavior During Therapy: WFL for tasks assessed/performed Overall Cognitive Status: Within Functional Limits for tasks assessed                                                     Pertinent Vitals/ Pain       Pain Assessment: Faces Faces Pain Scale: Hurts little more Pain Location: chest, feet and drive line site Pain Descriptors / Indicators: Aching;Guarding;Grimacing Pain Intervention(s): Monitored during session;Repositioned         Frequency  Min 2X/week        Progress Toward Goals  OT Goals(current goals can now be found in the care plan section)  Progress towards OT goals: Progressing toward goals  Acute Rehab OT Goals Patient Stated Goal: to go home  Plan Discharge plan remains appropriate       AM-PAC PT "6 Clicks" Daily Activity     Outcome Measure   Help from another person eating meals?: None Help from another person taking care of personal grooming?: A Little Help from another person toileting, which includes using toliet, bedpan, or urinal?: A Little Help from another person bathing (including washing, rinsing, drying)?: A Little Help from another person to  put on and taking off regular upper body clothing?: A Little Help from another person to put on and taking off regular lower body clothing?: A Little 6 Click Score: 19    End of Session Equipment Utilized During Treatment: (eva walker)  OT Visit Diagnosis: Pain;Muscle weakness (generalized) (M62.81) Pain - part of body: (chest, feet, drive line site)   Activity Tolerance Patient tolerated treatment well   Patient Left in bed;with call bell/phone within reach   Nurse Communication (RN saw pt walk into his new room)        Time: 2355-7322 OT Time Calculation (min): 9 min  Charges: OT General Charges $OT  Visit: 1 Visit OT Treatments $Self Care/Home Management : 8-22 mins  Ignacia Palma, OTR/L Acute [employer] Pager [phone] Office [phone]

## 2018-02-03 NOTE — Progress Notes (Signed)
Physical Therapy Treatment Patient Details Name: Carl Ferguson MRN: [id number] DOB: [birth year] Today's Date: 02/03/2018    History of Present Illness Pt is a 52 y.o. male admitted 01/18/18 with fever. S/p Heartmate 3 LVAD placement on 10/14. PMH includes ICD recently placed 01/03/18, systolic CHF, NICM, chronic LBBB, tobacco abuse, prior ETOH abuse, prior substance abuse, h/o of device placements with extractions due to infection.     PT Comments    Patient progressing well towards PT goals, no physical assist or cues required this session. Patient ambulating at supervision levels mainly for line management. Anticipate patient will be safe for d/c home when medically appropriate. Encouraged continued mobility with staff. Current POC remains appropriate.   Follow Up Recommendations  Home health PT;Supervision/Assistance - 24 hour     Equipment Recommendations  3in1 (PT)(rollator)    Recommendations for Other Services       Precautions / Restrictions Precautions Precautions: Fall;Sternal Precaution Comments: Good recall of sternal precautions with mobility Restrictions Weight Bearing Restrictions: Yes(sternal precautions)    Mobility  Bed Mobility Overal bed mobility: Independent Bed Mobility: Sit to Supine           General bed mobility comments: no physical assist or cues required  Transfers Overall transfer level: Independent Equipment used: None Transfers: Sit to/from Stand Sit to Stand: Independent         General transfer comment: no physical assist or cues required  Ambulation/Gait Ambulation/Gait assistance: Supervision Gait Distance (Feet): 710 Feet Assistive device: IV Pole Gait Pattern/deviations: Step-through pattern;Decreased stride length;Trunk flexed   Gait velocity interpretation: 1.31 - 2.62 ft/sec, indicative of limited community ambulator General Gait Details: supervision for Health visitor    Modified Rankin (Stroke Patients Only)       Balance Overall balance assessment: Needs assistance Sitting-balance support: No upper extremity supported;Feet supported Sitting balance-Leahy Scale: Good     Standing balance support: No upper extremity supported Standing balance-Leahy Scale: Fair Standing balance comment: no physical assist or external support                            Cognition Arousal/Alertness: Awake/alert Behavior During Therapy: WFL for tasks assessed/performed Overall Cognitive Status: Within Functional Limits for tasks assessed                                        Exercises      General Comments        Pertinent Vitals/Pain Pain Assessment: Faces Faces Pain Scale: Hurts little more Pain Location: chest, feet and drive line site Pain Descriptors / Indicators: Aching;Guarding;Grimacing Pain Intervention(s): Monitored during session    Home Living                      Prior Function            PT Goals (current goals can now be found in the care plan section) Acute Rehab PT Goals Patient Stated Goal: to go home PT Goal Formulation: With patient Time For Goal Achievement: 02/12/18 Potential to Achieve Goals: Good Progress towards PT goals: Progressing toward goals    Frequency    Min 3X/week      PT Plan Current plan remains appropriate    Co-evaluation  AM-PAC PT "6 Clicks" Daily Activity  Outcome Measure  Difficulty turning over in bed (including adjusting bedclothes, sheets and blankets)?: None Difficulty moving from lying on back to sitting on the side of the bed? : A Little Difficulty sitting down on and standing up from a chair with arms (e.g., wheelchair, bedside commode, etc,.)?: None Help needed moving to and from a bed to chair (including a wheelchair)?: A Little Help needed walking in hospital room?: A Little Help needed climbing 3-5 steps with a  railing? : A Little 6 Click Score: 20    End of Session   Activity Tolerance: Patient tolerated treatment well Patient left: in bed;with call bell/phone within reach Nurse Communication: Mobility status PT Visit Diagnosis: Unsteadiness on feet (R26.81);Muscle weakness (generalized) (M62.81) Pain - part of body: (sternum)     Time: 1610-9604 PT Time Calculation (min) (ACUTE ONLY): 10 min  Charges:  $Gait Training: 8-22 mins                     Charlotte Crumb, PT DPT  Board Certified Neurologic Specialist Acute Rehabilitation Services Pager [phone] Office [phone]    Fabio Asa 02/03/2018, 10:22 AM

## 2018-02-03 NOTE — Progress Notes (Signed)
CARDIAC REHAB PHASE I    Pt currently getting a dressing change. Will follow-up tomorrow.  Reynold Bowen, RN BSN 02/03/2018 2:59 PM

## 2018-02-03 NOTE — Progress Notes (Addendum)
Patient ID: Carl Ferguson, male   DOB: 03-21-1966, 52 y.o.   MRN: [id number]     Advanced Heart Failure Rounding Note  PCP-Cardiologist: Garwin Brothers, MD   Subjective:    Events 10/11 RHC - Stable on milrinone 0.25 10/14 HM3 implantation; 2 units FFP. CTs and pressors in place out of OR.  10/14 pm. Speed reduced to 5600 after large UOP and many PI events.  10/15 spiked fever up to 103.5. Started on vanc/unasyn. Cxs sent.  Coox 52% OFF milrinone this am. CVP 16-17.  Brisk UOP with -2.7 L and weight down 4 lbs. Received 80 mg IV lasix BID + metolazone yesterday. MAPs 80-100  Afebrile. WBC 13.3 > 13.3. No longer on ABX.   INR pending. On coumadin and heparin drip.   Feels okay this morning. Feels like he is getting dehydrated because he is urinating so much. Ambulating with no problems. Says he coughed up a blood clot last night. No other bleeding.  LVAD Interrogation HM 3: Speed: 5600 Flow: 5.0 PI: 3.3 Power: 4.0. No PI events/24 hours  Objective:   Weight Range: 65.9 kg Body mass index is 22.75 kg/m.   Vital Signs:   Temp:  [97.9 F (36.6 C)-98.8 F (37.1 C)] 98.8 F (37.1 C) (10/21 0700) Pulse Rate:  [27-146] 119 (10/21 0700) Resp:  [15-33] 24 (10/21 0700) BP: (81-114)/(56-97) 114/77 (10/21 0700) SpO2:  [93 %-100 %] 94 % (10/21 0700) Weight:  [65.9 kg] 65.9 kg (10/21 0600) Last BM Date: 02/02/18  Weight change: Filed Weights   02/01/18 0600 02/02/18 0457 02/03/18 0600  Weight: 68.4 kg 67.6 kg 65.9 kg   Intake/Output:   Intake/Output Summary (Last 24 hours) at 02/03/2018 8295 Last data filed at 02/03/2018 0800 Gross per 24 hour  Intake 1191.86 ml  Output 4400 ml  Net -3208.14 ml    Physical Exam    General: Well appearing this am. NAD.  HEENT: Normal. Neck: Supple, JVP 16-17 cm. Carotids OK.  Cardiac:  Mechanical heart sounds with LVAD hum present.  Lungs:  CTAB, normal effort.  Abdomen:  NT, ND, no HSM. No bruits or masses. +BS  LVAD exit site:  Dressing dry and intact. No erythema or drainage. Stabilization device present and accurately applied. Extremities:  Warm and dry. No cyanosis, clubbing, rash, or edema.  Neuro:  Alert & oriented x 3. Cranial nerves grossly intact. Moves all 4 extremities w/o difficulty. Affect pleasant    Telemetry   Generally Vpacing 100-110s. Occasional pacing in middle of QRS>  Labs    CBC Recent Labs    02/02/18 0434 02/03/18 0420  WBC 13.3* 13.3*  NEUTROABS 10.5* 9.7*  HGB 9.5* 10.0*  HCT 28.7* 29.7*  MCV 92.9 91.7  PLT 265 302   Basic Metabolic Panel Recent Labs    62/13/08 0434 02/03/18 0420  NA 128* 128*  K 4.1 3.9  CL 94* 91*  CO2 25 28  GLUCOSE 134* 108*  BUN 8 8  CREATININE 0.77 0.79  CALCIUM 8.8* 9.1  MG 2.1 2.0   Liver Function Tests Recent Labs    02/02/18 0434 02/03/18 0420  AST 18 19  ALT 13 12  ALKPHOS 59 64  BILITOT 0.6 0.9  PROT 6.3* 6.9  ALBUMIN 3.0* 3.1*   No results for input(s): LIPASE, AMYLASE in the last 72 hours. Cardiac Enzymes No results for input(s): CKTOTAL, CKMB, CKMBINDEX, TROPONINI in the last 72 hours.  BNP: BNP (last 3 results) Recent Labs    12/30/17  8119 01/28/18 0542 02/03/18 0045  BNP 4,087.0* 235.0* 549.1*    ProBNP (last 3 results) No results for input(s): PROBNP in the last 8760 hours.   D-Dimer No results for input(s): DDIMER in the last 72 hours. Hemoglobin A1C No results for input(s): HGBA1C in the last 72 hours. Fasting Lipid Panel No results for input(s): CHOL, HDL, LDLCALC, TRIG, CHOLHDL, LDLDIRECT in the last 72 hours. Thyroid Function Tests No results for input(s): TSH, T4TOTAL, T3FREE, THYROIDAB in the last 72 hours.  Invalid input(s): FREET3  Other results:   Imaging    Dg Chest Port 1 View  Result Date: 02/02/2018 CLINICAL DATA:  LVAD EXAM: PORTABLE CHEST 1 VIEW COMPARISON:  Yesterday FINDINGS: Chronic cardiopericardial enlargement. ICD/pacer leads from the right in stable position. Partially  visualized LVAD. Hazy lower chest opacities likely atelectasis and pleural fluid. No pneumothorax. IMPRESSION: Hazy opacities at the bases, similar to prior, likely atelectasis and pleural fluid. Electronically Signed   By: Marnee Spring M.D.   On: 02/02/2018 14:21     Medications:     Scheduled Medications: . amiodarone  200 mg Oral BID  . aspirin EC  81 mg Oral Daily  . bisacodyl  10 mg Oral Daily   Or  . bisacodyl  10 mg Rectal Daily  . Chlorhexidine Gluconate Cloth  6 each Topical Daily  . docusate sodium  200 mg Oral Daily  . feeding supplement (ENSURE ENLIVE)  237 mL Oral BID BM  . furosemide  80 mg Intravenous BID  . gabapentin  300 mg Oral BID  . insulin aspart  0-24 Units Subcutaneous TID AC & HS  . losartan  25 mg Oral Daily  . mouth rinse  15 mL Mouth Rinse BID  . pantoprazole  40 mg Oral Daily  . potassium chloride  40 mEq Oral BID  . sodium chloride flush  10-40 mL Intracatheter Q12H  . sodium chloride flush  3 mL Intravenous Q12H  . Warfarin - Pharmacist Dosing Inpatient   Does not apply q1800    Infusions: . sodium chloride 10 mL/hr at 02/03/18 0800  . heparin 1,250 Units/hr (02/03/18 0800)    PRN Medications: sodium chloride, acetaminophen, hydrALAZINE, lip balm, ondansetron (ZOFRAN) IV, oxyCODONE, traMADol  Assessment/Plan   1. Chronic Systolic CHF: S/p HM3 01/27/18.  Nonischemic cardiomyopathy, diagnosed in 2011, initially followed at [place]/[facility].  LHC (3/19) with no significant CAD.  CPX (3/19) submaximal, but suggestive of mild to moderate HF limitation. HIV negative 3/19, history of drug/ETOH abuse in the past, no drugs now and had cut back ETOH considerably.  Echo 9/19 showed EF 20% with severe LV dilation, severe MR.  NYHA class IV symptoms. He has a wide LBBB.  RHC was done in 9/19 showing low output, CI 1.4.  He was admitted to start milrinone, unable to wean off milrinone and remained on 0.25 mcg/kg/min at home.  [employer]onic ICD was placed, he has a  His bundle lead but EP was unable to get an LV lead in place => His bundle pacing does not narrow his QRS and did not help [place] wean him off milrinone.  He did well on milrinone 0.25 mcg/kg/min, NYHA class II.  RHC 10/11 shows filling pressures and cardiac output to be fairly well optimized. HM3 placed 10/14.  Now off milrinone post-op.  Co-ox 52% this morning with CVP 16-17 - Continue Lasix 80 mg IV bid. Give another dose metolazone today. Recheck Coox this afternoon. Tends to run low in am.  -  Continue losartan 25 mg daily.  Consider increasing with MAPs 80-100. BP may improve with further diuresis. - INR pending this am. On warfarin + heparin drip until INR > 1.8 again.  Now on ASA 81. - Continue walking unit.   - LDH stable 242 2. ID: Treated with Augmentin for dental infection pre-op.  POD#1 had fever of 103.5. Started on Vanc/Unasyn.  Afebrile now. WBCs 13. Cultures NGTD and off antibiotics.  He has had episodes of loose stool. - C diff negative.  3. ETOH Abuse: No longer drinking. No change.  4. Tobacco abuse: He has quit smoking since admission in 9/19.  - Continue to encourage complete cessation. No change.   5. Mitral regurgitation: Severe functional MR.  With LV dilated > 7 cm, suspect Mitraclip would be unlikely to help him appreciably.  Decreased MR s/p LVAD placement.  No change.  6. VT: Patient had episode of VT requiring ATP prior to admission. Had VT for around 5 minutes 01/29/18.   - Continue po amiodarone.  No change.  7. Hypokalemia: Resolved. K 3.9 today 8. Hyponatremia: Na 128, will fluid restrict for now.  If drops further would give tolvaptan to help with diuresis.  9. Abnormal pacing  - Pacing in middle of QRS occasionally.  - [employer] rep to interrogate today  Transfer to stepdown today.   Alford Highland, NP  02/03/2018 8:22 AM   VAD Team Pager [phone] (7am - 7am) ++VAD ISSUES ONLY++   Advanced Heart Failure Team Pager [phone] (M-F; 7a - 4p)  Please contact  [facility] for night-coverage after hours (4p -7a ) and weekends on [email or web address]  Patient seen with NP, agree with the above note.  Repeat co-ox 58% this morning.  CVP still 16.  Diuresed well yesterday.  Walked in hall today without problems.    Today, will continue IV Lasix and give another dose of metolazone.  Creatinine stable.   INR remains low, continue heparin gtt until INR > 1.8.   MAP elevated 90s, will increase losartan to 25 mg bid.   LVAD parameters reviewed and stable.   To stepdown today.   Marca Ancona 02/03/2018 12:07 PM

## 2018-02-03 NOTE — Progress Notes (Signed)
ANTICOAGULATION CONSULT NOTE - Follow Up Consult  Pharmacy Consult for Heparin and Coumadin Indication: LVAD  Allergies  Allergen Reactions  . Norflex [Orphenadrine] Swelling    FACE [ANGIOEDEMA]    Patient Measurements: Height: 5\' 7"  (170.2 cm) Weight: 145 lb 4.5 oz (65.9 kg) IBW/kg (Calculated) : 66.1 Heparin Dosing Weight: 65.9 kg  Vital Signs: BP: 104/87 (10/21 1022) Pulse Rate: 65 (10/21 0900)  Labs: Recent Labs    02/01/18 0425 02/02/18 0434  02/03/18 0026 02/03/18 0420 02/03/18 0837 02/03/18 1555  HGB 9.6* 9.5*  --   --  10.0*  --   --   HCT 28.1* 28.7*  --   --  29.7*  --   --   PLT 202 265  --   --  302  --   --   LABPROT 19.8* 18.9*  --   --   --  16.8*  --   INR 1.70 1.61  --   --   --  1.38  --   HEPARINUNFRC  --   --    < > <0.10*  --  <0.10* <0.10*  CREATININE 0.81 0.77  --   --  0.79  --   --    < > = values in this interval not displayed.    Estimated Creatinine Clearance: 101.8 mL/min (by C-G formula based on SCr of 0.79 mg/dL).  Assessment:  52 yr old male s/p HM3 on 10/14.   On heparin and Coumadin.  Coumadin held on 10/18 after significant jump in INR 1.3>2.24, then trended down to 1.38 today after Coumadin 2 mg on 10/19 and 2.5 mg on 10/20.  Coumadin dose increased to 4 mg today.   Heparin level remains undetectable (< 0.10) on 1400 units/hr.  No infusion problems noted.  Goal of Therapy:  Heparin level 0.3-0.5 units/ml  INR 2-2.5 Monitor platelets by anticoagulation protocol: Yes   Plan:   Increase heparin drip to 1600 units/hr  Coumadin 4 mg given tonight.  Next heparin level, PT/INR and CBC with am labs.  Dennie Fetters, [place] Pager: [phone] or phone: [phone] 02/03/2018,7:24 PM

## 2018-02-03 NOTE — Progress Notes (Signed)
VAD exist site care: Existing VAD dressing removed and site care performed using sterile technique. Drive line exit site cleaned with Chlora prep applicators x 2, allowed to dry, and gauze with silver stip re-applied. Exit site with no tissue ingrowth, 2 sutures intact, and the velour is fully implanted at exit site. Scant amount of bloody drainage noted at site, and on gauze dressing. No redness, tenderness, foul odor or rash noted. Drive line anchor re-applied. Dressing to be changed daily by VAD nurse, nurse champion, or trained caregiver.  No family present at bedside for dressing change education.      [name] RN VAD Coordinator  Office: [phone]  24/7 Pager: [phone]

## 2018-02-03 NOTE — Progress Notes (Addendum)
ANTICOAGULATION CONSULT NOTE - Follow Up Consult  Pharmacy Consult for heparin Indication: LVAD  HEPARIN DW (KG): 65.9   Labs: Recent Labs    02/01/18 0425 02/02/18 0434 02/02/18 1630 02/03/18 0026 02/03/18 0420 02/03/18 0837  HGB 9.6* 9.5*  --   --  10.0*  --   HCT 28.1* 28.7*  --   --  29.7*  --   PLT 202 265  --   --  302  --   LABPROT 19.8* 18.9*  --   --   --  16.8*  INR 1.70 1.61  --   --   --  1.38  HEPARINUNFRC  --   --  <0.10* <0.10*  --  <0.10*  CREATININE 0.81 0.77  --   --  0.79  --     Assessment: 52 yoM now s/p HM3 on 10/14. INR jump on 10/18 1.3 >2, warfarin dose held on 10/18.    HL remains undetectable despite rate increase to heparin 1250 units/hr.  INR today down to 1.38 s/p warfarin 2.5 mg last night and 2 mg the day prior. Hb low but stable. .Pt reports coughing up a blood clot yesterday. No other signs/symptoms of major bleeding. LDH stable.   Goal of Therapy:  Heparin level 0.3-0.5 units/ml   Plan:  Will increase heparin gtt by 2-3 units/kg/hr to 1400 units/hr  Check anti-Xa level in 6 hours and daily while on heparin  Warfarin 4 mg x1 Daily CBC and INR    Marcelino Freestone, PharmD PGY2 Cardiology Pharmacy Resident Phone [phone] Please check AMION for all Pharmacist numbers by unit 02/03/2018 9:37 AM

## 2018-02-03 NOTE — Progress Notes (Signed)
Nutrition Follow-up  DOCUMENTATION CODES:   Not applicable  INTERVENTION:  - Continue Ensure Enlive but will increase to TID, each supplement provides 350 kcal and 20 grams of protein. - Continue to encourage PO intakes.    NUTRITION DIAGNOSIS:   Increased nutrient needs related to chronic illness(advanced heart failure) as evidenced by estimated needs. -ongoing  GOAL:   Patient will meet greater than or equal to 90% of their needs -unmet on average  MONITOR:   PO intake, Supplement acceptance, Diet advancement, Weight trends, Labs  ASSESSMENT:   52 year old man with nonischemic cardiomyopathy, dependent on milrinone, and awaiting VAD placement, who presents with fever.    Current weight consistent with admission weight. Patient was extubated on 10/15 at ~12:30 PM. Estimated nutrition needs updated at this time.   Diet was advanced from NPO to CLD on 10/15 at 3:15 PM, to FLD on 10/16 at 7:15 AM, to Heart Healthy/Carb Modified on 10/17 at 7:45 AM, and changed to Dysphagia 3 a few minutes later. A fluid restriction of 1.5L/day was added yesterday at 9:20 AM.   Per chart review, patient consumed 50% of all meals on 10/18 (total of 670 kcal, 7 grams of protein), 50% of breakfast on 10/19 (290 kcal, 4 grams of protein), and 0% of all meals yesterday.  Patient reports that he did well with a [place] sandwich and soup the other day but has mainly been consuming items such as yogurt, oatmeal, and pudding as he sometimes finds it hard to swallow. He does not feel like he gets choked. He denies any difficulties with swallowing. He denies any taste changes, abdominal pain, or nausea with PO intakes. He does report that cold items cause some discomfort since LVAD placed but that this quickly disappears.   Ensure Enlive was ordered BID on 10/18 and patient reports that he has been drinking all of these and feels he could consume them TID.   Medications reviewed; 200 mg Colace/day, 80 mg IV  Lasix BID, sliding scale Novolog, 40 mg oral Protonix/day, 40 mEq K-Dur BID. Labs reviewed; CBGs:17 mg/dL, Na: 748 mmol/L, Cl: 91 mmol/L.        Diet Order:   Diet Order            DIET DYS 3 Room service appropriate? Yes; Fluid consistency: Thin; Fluid restriction: 1500 mL Fluid  Diet effective now              EDUCATION NEEDS:   Not appropriate for education at this time  Skin:  Skin Assessment: Skin Integrity Issues: Skin Integrity Issues:: Incisions Incisions: chest  Last BM:  10/20  Height:   Ht Readings from Last 1 Encounters:  02/03/18 5\' 7"  (1.702 m)    Weight:   Wt Readings from Last 1 Encounters:  02/03/18 65.9 kg    Ideal Body Weight:  67.3 kg  BMI:  Body mass index is 22.75 kg/m.  Estimated Nutritional Needs:   Kcal:  2707-8675  Protein:  80-95 grams  Fluid:  per MD     Trenton Gammon, MS, RD, LDN, CNSC Inpatient Clinical Dietitian Pager # [phone] After hours/weekend pager # [phone]

## 2018-02-04 ENCOUNTER — Inpatient Hospital Stay (HOSPITAL_COMMUNITY): Payer: Medicaid Other

## 2018-02-04 LAB — COMPREHENSIVE METABOLIC PANEL
ALBUMIN: 3.3 g/dL — AB (ref 3.5–5.0)
ALT: 15 U/L (ref 0–44)
AST: 25 U/L (ref 15–41)
Alkaline Phosphatase: 67 U/L (ref 38–126)
Anion gap: 14 (ref 5–15)
BUN: 14 mg/dL (ref 6–20)
CO2: 28 mmol/L (ref 22–32)
CREATININE: 1.02 mg/dL (ref 0.61–1.24)
Calcium: 9.4 mg/dL (ref 8.9–10.3)
Chloride: 83 mmol/L — ABNORMAL LOW (ref 98–111)
GFR calc Af Amer: >60 mL/min (ref >60)
GFR calc non Af Amer: >60 mL/min (ref >60)
GLUCOSE: 109 mg/dL — AB (ref 70–99)
POTASSIUM: 4.1 mmol/L (ref 3.5–5.1)
Sodium: 125 mmol/L — ABNORMAL LOW (ref 135–145)
TOTAL PROTEIN: 7.4 g/dL (ref 6.5–8.1)
Total Bilirubin: 1 mg/dL (ref 0.3–1.2)

## 2018-02-04 LAB — GLUCOSE, CAPILLARY
GLUCOSE-CAPILLARY: 138 mg/dL — AB (ref 70–99)
GLUCOSE-CAPILLARY: 118 mg/dL — AB (ref 70–99)
Glucose-Capillary: 121 mg/dL — ABNORMAL HIGH (ref 70–99)
Glucose-Capillary: 139 mg/dL — ABNORMAL HIGH (ref 70–99)

## 2018-02-04 LAB — CBC
HCT: 29.1 % — ABNORMAL LOW (ref 39.0–52.0)
HEMOGLOBIN: 9.9 g/dL — AB (ref 13.0–17.0)
MCH: 30.9 pg (ref 26.0–34.0)
MCHC: 34 g/dL (ref 30.0–36.0)
MCV: 90.9 fL (ref 80.0–100.0)
Platelets: 388 10*3/uL (ref 150–400)
RBC: 3.2 MIL/uL — AB (ref 4.22–5.81)
RDW: 13.2 % (ref 11.5–15.5)
WBC: 14.8 10*3/uL — ABNORMAL HIGH (ref 4.0–10.5)
nRBC: 0 % (ref 0.0–0.2)

## 2018-02-04 LAB — HEPARIN LEVEL (UNFRACTIONATED)
Heparin Unfractionated: 0.11 IU/mL — ABNORMAL LOW (ref 0.30–0.70)
Heparin Unfractionated: 0.13 IU/mL — ABNORMAL LOW (ref 0.30–0.70)

## 2018-02-04 LAB — COOXEMETRY PANEL
CARBOXYHEMOGLOBIN: 1.7 % — AB (ref 0.5–1.5)
CARBOXYHEMOGLOBIN: 1.7 % — AB (ref 0.5–1.5)
METHEMOGLOBIN: 1.4 % (ref 0.0–1.5)
Methemoglobin: 1.5 % (ref 0.0–1.5)
O2 Saturation: 44.5 %
O2 Saturation: 41.9 %
TOTAL HEMOGLOBIN: 10.1 g/dL — AB (ref 12.0–16.0)
Total hemoglobin: 10.7 g/dL — ABNORMAL LOW (ref 12.0–16.0)

## 2018-02-04 LAB — PROTIME-INR
INR: 1.37
Prothrombin Time: 16.7 seconds — ABNORMAL HIGH (ref 11.4–15.2)

## 2018-02-04 LAB — LACTATE DEHYDROGENASE: LDH: 263 U/L — AB (ref 98–192)

## 2018-02-04 MED ORDER — SODIUM CHLORIDE 0.9 % IV SOLN: 250.0000 mL | INTRAVENOUS | Status: DC | PRN

## 2018-02-04 MED ORDER — TOLVAPTAN 15 MG PO TABS
15.0000 mg | ORAL_TABLET | Freq: Once | ORAL | Status: AC
  Administered 2018-02-04: 15 mg via ORAL
  Filled 2018-02-04: qty 1

## 2018-02-04 MED ORDER — SODIUM CHLORIDE 0.9% FLUSH
3.0000 mL | Freq: Two times a day (BID) | INTRAVENOUS | Status: DC
  Administered 2018-02-04: 3 mL via INTRAVENOUS

## 2018-02-04 MED ORDER — SODIUM CHLORIDE 0.9% FLUSH: 3.0000 mL | INTRAVENOUS | Status: DC | PRN

## 2018-02-04 MED ORDER — WARFARIN SODIUM 5 MG PO TABS
5.0000 mg | ORAL_TABLET | Freq: Once | ORAL | Status: AC
  Administered 2018-02-04: 5 mg via ORAL
  Filled 2018-02-04: qty 1

## 2018-02-04 MED ORDER — SODIUM CHLORIDE 0.9 % IV SOLN
INTRAVENOUS | Status: DC
  Administered 2018-02-05: 06:00:00 via INTRAVENOUS

## 2018-02-04 NOTE — Progress Notes (Addendum)
Received order to place Right AC PIV for Cath in am.  Unable to place due to TL PICC in right arm.  Floor RN notified.

## 2018-02-04 NOTE — Progress Notes (Signed)
HeartMate 3 Rounding Note  HM3 Implant 10-14 for DT indication Subjective:   Nonischemic cardiomyopathy, EF 15%, inotrope dependent requiring hospitalization prior to implantation  Patient with adequate RV  function, extubated postop day 1,mobilized OOB to chair  Patient developed postop fever, probably inflammatory response to cardiopulmonary bypass.  Now improved.   empiric Unasyn and vancomycin n stopped after negative cultures and resolution of fever  Postop tachycardia improved now on oral amiodarone Postoperative fluid overload significantly improved with Lasix drip, now on IV dosing based on wt, cvp  All chest drains have been removed, chest x-ray  10-21 clear Patient ambulating in hallway and transferred to stepdown POD#6   Postop INR has dipped and he is now on a heparin bridge managed by pharmacy. Will remove EPWs when iv heparin stops   VAD parameters all satisfactory with VAD flows almost 5 L/min  LVAD INTERROGATION:  HeartMate 3 LVAD:  Flow 4.7 L/min  liters/min, speed 5600 rpm, power 4.1, PI 3.2.  Controller connected.   Objective:    Vital Signs:   Temp:  [98.7 F (37.1 C)-99.6 F (37.6 C)] 99.6 F (37.6 C) (10/22 0818) Pulse Rate:  [57-108] 108 (10/22 0818) Resp:  [15-23] 23 (10/21 2300) BP: (93-128)/(60-95) 93/60 (10/22 0818) SpO2:  [95 %-98 %] 95 % (10/22 0818) Weight:  [65.2 kg] 65.2 kg (10/22 0500) Last BM Date: 02/02/18 Mean arterial Pressure 75-85 mm Hg  Intake/Output:   Intake/Output Summary (Last 24 hours) at 02/04/2018 1032 Last data filed at 02/04/2018 0038 Gross per 24 hour  Intake 165.86 ml  Output 1250 ml  Net -1084.14 ml     Physical Exam: General:  Well appearing. No resp difficulty HEENT: normal Neck: supple. JVP . Carotids 2+ bilat; no bruits. No lymphadenopathy or thryomegaly appreciated. Cor: Mechanical heart sounds with LVAD hum present. Lungs: clear Abdomen: soft, nontender, nondistended. No hepatosplenomegaly. No bruits or  masses. Good bowel sounds. Extremities: no cyanosis, clubbing, rash, edema Neuro: alert & orientedx3, cranial nerves grossly intact. moves all 4 extremities w/o difficulty. Affect pleasant  Telemetry:sinus tach  Labs: Basic Metabolic Panel: Recent Labs  Lab 01/29/18 0405  01/30/18 0352  01/31/18 0250 01/31/18 1248 02/01/18 0425 02/02/18 0434 02/03/18 0420 02/04/18 0500  NA 132*   < > 130*   < > 129* 131* 130* 128* 128* 125*  K 3.7   < > 3.4*   < > 3.1* 4.0 3.7 4.1 3.9 4.1  CL 97*   < > 94*   < > 93* 97* 93* 94* 91* 83*  CO2 25  --  24   < > 26 26 27 25 28 28   GLUCOSE 152*   < > 191*   < > 212* 144* 146* 134* 108* 109*  BUN 11   < > 9   < > 6 8 7 8 8 14   CREATININE 0.91   < > 0.73   < > 0.74 0.77 0.81 0.77 0.79 1.02  CALCIUM 9.4  --  8.7*   < > 8.8* 8.9 8.9 8.8* 9.1 9.4  MG 2.0  --  1.8  --  2.0  --  1.8 2.1 2.0  --   PHOS 3.2  --  2.2*  --   --   --   --   --   --   --    < > = values in this interval not displayed.    Liver Function Tests: Recent Labs  Lab 01/31/18 0250 02/01/18 0425 02/02/18 0434 02/03/18 1610  02/04/18 0500  AST 22 18 18 19 25   ALT 13 12 13 12 15   ALKPHOS 58 56 59 64 67  BILITOT 0.6 0.6 0.6 0.9 1.0  PROT 6.0* 6.1* 6.3* 6.9 7.4  ALBUMIN 3.2* 3.0* 3.0* 3.1* 3.3*   No results for input(s): LIPASE, AMYLASE in the last 168 hours. No results for input(s): AMMONIA in the last 168 hours.  CBC: Recent Labs  Lab 01/30/18 0352  01/31/18 0250 02/01/18 0425 02/02/18 0434 02/03/18 0420 02/04/18 0500  WBC 14.5*  --  10.4 10.3 13.3* 13.3* 14.8*  NEUTROABS 11.7*  --  8.2* 7.6 10.5* 9.7*  --   HGB 10.1*   < > 9.5* 9.6* 9.5* 10.0* 9.9*  HCT 29.3*   < > 27.5* 28.1* 28.7* 29.7* 29.1*  MCV 90.7  --  92.0 92.1 92.9 91.7 90.9  PLT 156  --  160 202 265 302 388   < > = values in this interval not displayed.    INR: Recent Labs  Lab 01/31/18 1248 02/01/18 0425 02/02/18 0434 02/03/18 0837 02/04/18 0500  INR 2.24 1.70 1.61 1.38 1.37    Other  results:  EKG:   Imaging: Dg Chest 2 View  Result Date: 02/04/2018 CLINICAL DATA:  LVAD. EXAM: CHEST - 2 VIEW COMPARISON:  02/02/2018. FINDINGS: Prior median sternotomy cardiac pacer and LVAD in stable position. Stable cardiomegaly. Improved aeration in the lung bases. Mild residual bibasilar atelectasis/infiltrates. Small left pleural effusion. No pneumothorax IMPRESSION: 1. Prior median sternotomy. Cardiac pacer and LVAD in stable position. Stable cardiomegaly. 2. Improved aeration both lung bases with mild residual bibasilar atelectasis/infiltrates. Small left pleural effusion. Electronically Signed   By: Maisie Fus  Register   On: 02/04/2018 09:50     Medications:     Scheduled Medications: . amiodarone  200 mg Oral BID  . aspirin EC  81 mg Oral Daily  . bisacodyl  10 mg Oral Daily   Or  . bisacodyl  10 mg Rectal Daily  . Chlorhexidine Gluconate Cloth  6 each Topical Daily  . docusate sodium  200 mg Oral Daily  . feeding supplement (ENSURE ENLIVE)  237 mL Oral TID BM  . furosemide  80 mg Intravenous BID  . gabapentin  400 mg Oral BID  . insulin aspart  0-24 Units Subcutaneous TID AC & HS  . losartan  25 mg Oral BID  . mouth rinse  15 mL Mouth Rinse BID  . pantoprazole  40 mg Oral Daily  . potassium chloride  40 mEq Oral BID  . sodium chloride flush  10-40 mL Intracatheter Q12H  . sodium chloride flush  3 mL Intravenous Q12H  . sodium chloride flush  3 mL Intravenous Q12H  . Warfarin - Pharmacist Dosing Inpatient   Does not apply q1800    Infusions: . sodium chloride Stopped (02/03/18 1007)  . sodium chloride    . [START ON 02/05/2018] sodium chloride    . heparin 1,800 Units/hr (02/04/18 0840)    PRN Medications: sodium chloride, sodium chloride, acetaminophen, hydrALAZINE, lip balm, ondansetron (ZOFRAN) IV, oxyCODONE, sodium chloride flush, traMADol   Assessment:  HeartMate 3 implantation on October 14 Preop history of nonischemic cardiomyopathy, severe MR, EF  15% Patient ICD-pacemaker, chronically paced rhythm RV function has been adequate postop, episode of atrial fibrillation treated with amiodarone Postop nonsustained VT treated with amiodarone, magnesium, milrinone weaned off.  VT resolved Plan/Discussion:   Fever has resolved and PICC line has been placed .coox remains satisfactory off milrinone Coumadin now  dosing per Pharm.D. We will remove epicardial pacing wires after patient off IV heparin and INR < 2.patient now on stepdown doing well 1 week postop HM3 I reviewed the LVAD parameters from today, and compared the results to the patient's prior recorded data.  No programming changes were made.  The LVAD is functioning within specified parameters.  The patient performs LVAD self-test daily.  LVAD interrogation was negative for any significant power changes, alarms or PI events/speed drops.  LVAD equipment check completed and is in good working order.  Back-up equipment present.   LVAD education done on emergency procedures and precautions and reviewed exit site care.  Length of Stay: [address]  Kathlee Nations [place] III 02/04/2018, 10:32 AM

## 2018-02-04 NOTE — H&P (View-Only) (Signed)
Patient ID: Carl Ferguson, male   DOB: 09/03/1965, 52 y.o.   MRN: [id number]     Advanced Heart Failure Rounding Note  PCP-Cardiologist: Garwin Brothers, MD   Subjective:    Events 10/11 RHC - Stable on milrinone 0.25 10/14 HM3 implantation; 2 units FFP. CTs and pressors in place out of OR.  10/14 pm. Speed reduced to 5600 after large UOP and many PI events.  10/15 spiked fever up to 103.5. Started on vanc/unasyn. Cxs sent.  Coox 44.5% this am but drawn early. Repeat pending. CVP not connected currently. Negative 1 L and down 2 more lbs.   WBC 13.3 -> 14.8. Tmax 99.0. No longer on ABX.   INR 1.37. On coumadin and heparin drip.   Feeling good this am. Denies SOB. Mild lightheadedness with standing. MAPs in 70-80s overnight.  LVAD Interrogation HM 3: Speed: 5600 Flow: 5.0 PI: 2.0 Power: 4.0. 4-6 PI events   Objective:   Weight Range: 65.2 kg Body mass index is 22.51 kg/m.   Vital Signs:   Temp:  [98.7 F (37.1 C)-99 F (37.2 C)] 98.7 F (37.1 C) (10/21 2300) Pulse Rate:  [57-115] 57 (10/21 2300) Resp:  [15-23] 23 (10/21 2300) BP: (94-130)/(65-105) 94/65 (10/21 2300) SpO2:  [95 %-98 %] 98 % (10/21 2300) Weight:  [65.2 kg] 65.2 kg (10/22 0500) Last BM Date: 02/02/18  Weight change: Filed Weights   02/02/18 0457 02/03/18 0600 02/04/18 0500  Weight: 67.6 kg 65.9 kg 65.2 kg   Intake/Output:   Intake/Output Summary (Last 24 hours) at 02/04/2018 0730 Last data filed at 02/04/2018 0038 Gross per 24 hour  Intake 188.37 ml  Output 1250 ml  Net -1061.63 ml    Physical Exam    General: Well appearing this am. NAD.  HEENT: Normal. Neck: Supple, JVP elevated cm. Carotids OK.  Cardiac:  Mechanical heart sounds with LVAD hum present.  Lungs:  CTAB, normal effort.  Abdomen:  NT, ND, no HSM. No bruits or masses. +BS  LVAD exit site: Dressing dry and intact. No erythema or drainage. Stabilization device present and accurately applied. Driveline dressing changed daily  per sterile technique. Extremities:  Warm and dry. No cyanosis, clubbing, rash, or edema.  Neuro:  Alert & oriented x 3. Cranial nerves grossly intact. Moves all 4 extremities w/o difficulty. Affect pleasant     Telemetry   V paced 100-110s, personally reviewed.   Labs    CBC Recent Labs    02/02/18 0434 02/03/18 0420 02/04/18 0500  WBC 13.3* 13.3* 14.8*  NEUTROABS 10.5* 9.7*  --   HGB 9.5* 10.0* 9.9*  HCT 28.7* 29.7* 29.1*  MCV 92.9 91.7 90.9  PLT 265 302 388   Basic Metabolic Panel Recent Labs    38/18/40 0434 02/03/18 0420  NA 128* 128*  K 4.1 3.9  CL 94* 91*  CO2 25 28  GLUCOSE 134* 108*  BUN 8 8  CREATININE 0.77 0.79  CALCIUM 8.8* 9.1  MG 2.1 2.0   Liver Function Tests Recent Labs    02/02/18 0434 02/03/18 0420  AST 18 19  ALT 13 12  ALKPHOS 59 64  BILITOT 0.6 0.9  PROT 6.3* 6.9  ALBUMIN 3.0* 3.1*   No results for input(s): LIPASE, AMYLASE in the last 72 hours. Cardiac Enzymes No results for input(s): CKTOTAL, CKMB, CKMBINDEX, TROPONINI in the last 72 hours.  BNP: BNP (last 3 results) Recent Labs    12/30/17 0858 01/28/18 0542 02/03/18 0045  BNP 4,087.0* 235.0* 549.1*  ProBNP (last 3 results) No results for input(s): PROBNP in the last 8760 hours.   D-Dimer No results for input(s): DDIMER in the last 72 hours. Hemoglobin A1C No results for input(s): HGBA1C in the last 72 hours. Fasting Lipid Panel No results for input(s): CHOL, HDL, LDLCALC, TRIG, CHOLHDL, LDLDIRECT in the last 72 hours. Thyroid Function Tests No results for input(s): TSH, T4TOTAL, T3FREE, THYROIDAB in the last 72 hours.  Invalid input(s): FREET3  Other results:   Imaging    No results found.   Medications:     Scheduled Medications: . amiodarone  200 mg Oral BID  . aspirin EC  81 mg Oral Daily  . bisacodyl  10 mg Oral Daily   Or  . bisacodyl  10 mg Rectal Daily  . Chlorhexidine Gluconate Cloth  6 each Topical Daily  . docusate sodium  200 mg  Oral Daily  . feeding supplement (ENSURE ENLIVE)  237 mL Oral TID BM  . furosemide  80 mg Intravenous BID  . gabapentin  400 mg Oral BID  . insulin aspart  0-24 Units Subcutaneous TID AC & HS  . losartan  25 mg Oral BID  . mouth rinse  15 mL Mouth Rinse BID  . pantoprazole  40 mg Oral Daily  . potassium chloride  40 mEq Oral BID  . sodium chloride flush  10-40 mL Intracatheter Q12H  . sodium chloride flush  3 mL Intravenous Q12H  . Warfarin - Pharmacist Dosing Inpatient   Does not apply q1800    Infusions: . sodium chloride Stopped (02/03/18 1007)  . heparin 1,600 Units/hr (02/04/18 0131)    PRN Medications: sodium chloride, acetaminophen, hydrALAZINE, lip balm, ondansetron (ZOFRAN) IV, oxyCODONE, traMADol  Assessment/Plan   1. Chronic Systolic CHF: S/p HM3 01/27/18.  Nonischemic cardiomyopathy, diagnosed in 2011, initially followed at [facility].  LHC (3/19) with no significant CAD.  CPX (3/19) submaximal, but suggestive of mild to moderate HF limitation. HIV negative 3/19, history of drug/ETOH abuse in the past, no drugs now and had cut back ETOH considerably.  Echo 9/19 showed EF 20% with severe LV dilation, severe MR.  NYHA class IV symptoms. He has a wide LBBB.  RHC was done in 9/19 showing low output, CI 1.4.  He was admitted to start milrinone, unable to wean off milrinone and remained on 0.25 mcg/kg/min at home.  Medtronic ICD was placed, he has a His bundle lead but EP was unable to get an LV lead in place => His bundle pacing does not narrow his QRS and did not help [place] wean him off milrinone.  He did well on milrinone 0.25 mcg/kg/min, NYHA class II.  RHC 10/11 shows filling pressures and cardiac output to be fairly well optimized. HM3 placed 10/14.  Now off milrinone post-op.  - Coox 44%, runs low in am. Repeat pending. CVP not connected. Pending.  - Continue lasix 80 mg IV BID.  - Continue losartan 25 mg BID. If remains lightheaded, may have to cut back.  - INR 1.37 this am.   On warfarin + heparin drip until INR > 1.8 again.  Now on ASA 81. - Continue walking unit.   - LDH pending.  2. ID: Treated with Augmentin for dental infection pre-op.  POD#1 had fever of 103.5. Started on Vanc/Unasyn.  Afebrile now. WBCs 14.8. - Cultures NGTD and off antibiotics.  He has had episodes of loose stool. - C diff negative.  3. ETOH Abuse: No longer drinking. No change.  4.  Tobacco abuse: He has quit smoking since admission in 9/19.  - Continue to encourage complete cessation. No change.  5. Mitral regurgitation: Severe functional MR.  With LV dilated > 7 cm, suspect Mitraclip would be unlikely to help him appreciably.  Decreased MR s/p LVAD placement.  No change.  6. VT: Patient had episode of VT requiring ATP prior to admission. Had VT for around 5 minutes 01/29/18.   - Continue po amiodarone.  No change.  7. Hypokalemia:  - CMET pending.  8. Hyponatremia:  - Na pending. If lower than 128 can consider tolvaptan.  9. Abnormal pacing  - Pacing in middle of QRS occasionally.  - Med[employer] rep to interrogate today  Transfer to stepdown today.   Graciella Freer, PA-C  02/04/2018 7:30 AM   VAD Team Pager [phone] (7am - 7am) ++VAD ISSUES ONLY++   Advanced Heart Failure Team Pager [phone] (M-F; 7a - 4p)  Please contact [facility] for night-coverage after hours (4p -7a ) and weekends on [email or web address]  Patient seen with NP, agree with the above note.  Co-ox low today at 41% but patient feels well.  CVP still 15.  Weight down with diuresis yesterday.  Walked in hall today without problems, breathing well.  Na lower at 125.  - I will give him tolvaptan today along with a dose of metolazone 2.5 x 1 and continue Lasix 80 mg IV bid.   - I am going to adjust his pump speed tomorrow under echo and RHC guidance. Will try to optimize pump speed to improve cardiac output.   INR remains low, continue heparin gtt until INR > 1.8.   MAP controlled today on losartan.    LVAD parameters reviewed and stable.   Marca Ancona 02/04/2018 3:52 PM

## 2018-02-04 NOTE — Progress Notes (Signed)
ANTICOAGULATION CONSULT NOTE - Follow Up Consult  Pharmacy Consult for Heparin and Coumadin Indication: LVAD  Allergies  Allergen Reactions  . Norflex [Orphenadrine] Swelling    FACE [ANGIOEDEMA]    Patient Measurements: Height: 5\' 7"  (170.2 cm) Weight: 143 lb 11.8 oz (65.2 kg) IBW/kg (Calculated) : 66.1 Heparin Dosing Weight: 65.9 kg  Vital Signs: Temp: 98.2 F (36.8 C) (10/22 1544) Temp Source: Oral (10/22 1544) BP: 106/88 (10/22 1544) Pulse Rate: 110 (10/22 1544)  Labs: Recent Labs    02/02/18 0434  02/03/18 0420 02/03/18 0837 02/03/18 1555 02/04/18 0500 02/04/18 1550  HGB 9.5*  --  10.0*  --   --  9.9*  --   HCT 28.7*  --  29.7*  --   --  29.1*  --   PLT 265  --  302  --   --  388  --   LABPROT 18.9*  --   --  16.8*  --  16.7*  --   INR 1.61  --   --  1.38  --  1.37  --   HEPARINUNFRC  --    < >  --  <0.10* <0.10* 0.11* 0.13*  CREATININE 0.77  --  0.79  --   --  1.02  --    < > = values in this interval not displayed.    Estimated Creatinine Clearance: 79 mL/min (by C-G formula based on SCr of 1.02 mg/dL).  Assessment: 52 yr old male s/p HM3 on 10/14.   On heparin and Coumadin.  Coumadin held on 10/18 after significant jump in INR 1.3>2.24.  INR remains subtherapeutic at 1.37 s/p warfarin 4 mg x1 yesterday. Hb low but stable. No signs/symptoms of bleeding. LDH relatively stable, up 244 > 263.   Heparin level low this morning on 1600 units/hr, increased to 1800 units/hr. Afternoon HL 0.13 on 1800 units/hr.  No issues with infusion reported by nursing.  Goal of Therapy:  Heparin level 0.3-0.5 units/ml  INR 2-2.5 Monitor platelets by anticoagulation protocol: Yes   Plan:  - Increase heparin drip to 1950 units/hr. Recheck heparin level in 6 hours - Warfarin 5 mg given tonight. - Next heparin level, PT/INR and CBC with am labs.   Marcelino Freestone, PharmD PGY2 Cardiology Pharmacy Resident Phone [phone] Please check AMION for all Pharmacist numbers  by unit 02/04/2018 5:04 PM

## 2018-02-04 NOTE — Progress Notes (Addendum)
LVAD Coordinator Rounding Note:  Admitted 01/18/18 due to fever and chills. Started on broad spectrum antibiotics. Teeth extractions done by Dr. Kristin Bruins 01/20/18. RHC done 01/24/18.   HM III LVAD implanted on 01/27/18 by Dr. Donata Clay under Destination Therapy criteria.  Patient sitting up in the bed. Pt has already walked around the unit this morning.  Vital signs: Temp: 98.7 HR: 97 AV-paced Cuff BP:  93/60 Doppler: 72 O2 Sat: 95% on RA Wt: 137.5>158.7>149.9>147.2>147.9>144.9>143.7 lbs   LVAD interrogation reveals:  Speed: 5600 Flow: 5.0 Power: 4.3w PI: 3.1 Alarms: none Events: none Hematocrit: 29  Fixed speed: 5600 Low speed limit: 5300   Drive Line:  Existing VAD dressing removed and site care performed using sterile technique. Drive line exit site cleaned with Chlora prep applicators x 2, allowed to dry, and gauze dressing with silver strip re-applied. 2 sutures intact. Exit site healing and unincorporated, the velour is fully implanted at exit site. No redness, tenderness, drainage, foul odor or rash noted. Drive line anchor re-applied.     Labs:  LDH trend: 282>324>263>262>472>263   INR trend: 1.26>1.33>1.31>1.30>2.0>2.71>1.37  WBC trend: 8.6>15.6>16.3>14.5>10.4>11.1>14.8  Anticoagulation Plan: -INR Goal:  2.0 - 2.5 -ASA Dose: ASA 324mg  daily until INR therapeutic  Blood Products:  -Intraop: 4 FFP; 450 cell saver   - Post op:  01/27/18>>1 PRBC  Device: - Medtronic ICD - Pacing: DDD 60 - Therapies: turned on 02/04/18  Drips: Milrinone 0.97mcg/kg/min--off 01/30/18 Amiodarone 30mg /hr--restarted 01/30/18--transitioned to PO 01/31/18 Lasix 8mg /hr--off Heparin--started 01/30/18--off 01/31/18  Arrythmias: 01/29/18- VT for roughly 5 minutes.  Amio bolus given.  Respiratory: extubated 01/28/18   Infection: Blood/urine/sputum cultures ordered due to fever 101.3.  - 01/28/18 BC's>> NTD -  01/28/18 urine cultures>>NTD  Adverse Events on VAD: -    VAD Education:  1. Per bedside RN report, patient was able to make power transitions without prompting this morning for walk.  2. Asked when girlfriend Hermelinda Dellen would be available for dressing change teaching. Per patient, she is on "2nd shift" today and she would not be by until late this evening.     Plan/Recommendations:  1. Daily dressing changes per VAD RN, nurse champion, or trained caregiver.  2. Pt will be NPO after MN for RHC/RAMP at 0830 in the morning. VAD coordinator will accompany pt to cath lab.  3. Call VAD pager with any equipment or drive line questions, or needs. 4. ICD therapy turned back on today.    Carlton Adam RN VAD Coordinator  Office: [phone]  24/7 Pager: [phone]

## 2018-02-04 NOTE — Progress Notes (Addendum)
Patient ID: Carl Ferguson, male   DOB: 07/23/1965, 52 y.o.   MRN: [id number]     Advanced Heart Failure Rounding Note  PCP-Cardiologist: Rajan R Revankar, MD   Subjective:    Events 10/11 RHC - Stable on milrinone 0.25 10/14 HM3 implantation; 2 units FFP. CTs and pressors in place out of OR.  10/14 pm. Speed reduced to 5600 after large UOP and many PI events.  10/15 spiked fever up to 103.5. Started on vanc/unasyn. Cxs sent.  Coox 44.5% this am but drawn early. Repeat pending. CVP not connected currently. Negative 1 L and down 2 more lbs.   WBC 13.3 -> 14.8. Tmax 99.0. No longer on ABX.   INR 1.37. On coumadin and heparin drip.   Feeling good this am. Denies SOB. Mild lightheadedness with standing. MAPs in 70-80s overnight.  LVAD Interrogation HM 3: Speed: 5600 Flow: 5.0 PI: 2.0 Power: 4.0. 4-6 PI events   Objective:   Weight Range: 65.2 kg Body mass index is 22.51 kg/m.   Vital Signs:   Temp:  [98.7 F (37.1 C)-99 F (37.2 C)] 98.7 F (37.1 C) (10/21 2300) Pulse Rate:  [57-115] 57 (10/21 2300) Resp:  [15-23] 23 (10/21 2300) BP: (94-130)/(65-105) 94/65 (10/21 2300) SpO2:  [95 %-98 %] 98 % (10/21 2300) Weight:  [65.2 kg] 65.2 kg (10/22 0500) Last BM Date: 02/02/18  Weight change: Filed Weights   02/02/18 0457 02/03/18 0600 02/04/18 0500  Weight: 67.6 kg 65.9 kg 65.2 kg   Intake/Output:   Intake/Output Summary (Last 24 hours) at 02/04/2018 0730 Last data filed at 02/04/2018 0038 Gross per 24 hour  Intake 188.37 ml  Output 1250 ml  Net -1061.63 ml    Physical Exam    General: Well appearing this am. NAD.  HEENT: Normal. Neck: Supple, JVP elevated cm. Carotids OK.  Cardiac:  Mechanical heart sounds with LVAD hum present.  Lungs:  CTAB, normal effort.  Abdomen:  NT, ND, no HSM. No bruits or masses. +BS  LVAD exit site: Dressing dry and intact. No erythema or drainage. Stabilization device present and accurately applied. Driveline dressing changed daily  per sterile technique. Extremities:  Warm and dry. No cyanosis, clubbing, rash, or edema.  Neuro:  Alert & oriented x 3. Cranial nerves grossly intact. Moves all 4 extremities w/o difficulty. Affect pleasant     Telemetry   V paced 100-110s, personally reviewed.   Labs    CBC Recent Labs    02/02/18 0434 02/03/18 0420 02/04/18 0500  WBC 13.3* 13.3* 14.8*  NEUTROABS 10.5* 9.7*  --   HGB 9.5* 10.0* 9.9*  HCT 28.7* 29.7* 29.1*  MCV 92.9 91.7 90.9  PLT 265 302 388   Basic Metabolic Panel Recent Labs    02/02/18 0434 02/03/18 0420  NA 128* 128*  K 4.1 3.9  CL 94* 91*  CO2 25 28  GLUCOSE 134* 108*  BUN 8 8  CREATININE 0.77 0.79  CALCIUM 8.8* 9.1  MG 2.1 2.0   Liver Function Tests Recent Labs    02/02/18 0434 02/03/18 0420  AST 18 19  ALT 13 12  ALKPHOS 59 64  BILITOT 0.6 0.9  PROT 6.3* 6.9  ALBUMIN 3.0* 3.1*   No results for input(s): LIPASE, AMYLASE in the last 72 hours. Cardiac Enzymes No results for input(s): CKTOTAL, CKMB, CKMBINDEX, TROPONINI in the last 72 hours.  BNP: BNP (last 3 results) Recent Labs    12/30/17 0858 01/28/18 0542 02/03/18 0045  BNP 4,087.0* 235.0* 549.1*      ProBNP (last 3 results) No results for input(s): PROBNP in the last 8760 hours.   D-Dimer No results for input(s): DDIMER in the last 72 hours. Hemoglobin A1C No results for input(s): HGBA1C in the last 72 hours. Fasting Lipid Panel No results for input(s): CHOL, HDL, LDLCALC, TRIG, CHOLHDL, LDLDIRECT in the last 72 hours. Thyroid Function Tests No results for input(s): TSH, T4TOTAL, T3FREE, THYROIDAB in the last 72 hours.  Invalid input(s): FREET3  Other results:   Imaging    No results found.   Medications:     Scheduled Medications: . amiodarone  200 mg Oral BID  . aspirin EC  81 mg Oral Daily  . bisacodyl  10 mg Oral Daily   Or  . bisacodyl  10 mg Rectal Daily  . Chlorhexidine Gluconate Cloth  6 each Topical Daily  . docusate sodium  200 mg  Oral Daily  . feeding supplement (ENSURE ENLIVE)  237 mL Oral TID BM  . furosemide  80 mg Intravenous BID  . gabapentin  400 mg Oral BID  . insulin aspart  0-24 Units Subcutaneous TID AC & HS  . losartan  25 mg Oral BID  . mouth rinse  15 mL Mouth Rinse BID  . pantoprazole  40 mg Oral Daily  . potassium chloride  40 mEq Oral BID  . sodium chloride flush  10-40 mL Intracatheter Q12H  . sodium chloride flush  3 mL Intravenous Q12H  . Warfarin - Pharmacist Dosing Inpatient   Does not apply q1800    Infusions: . sodium chloride Stopped (02/03/18 1007)  . heparin 1,600 Units/hr (02/04/18 0131)    PRN Medications: sodium chloride, acetaminophen, hydrALAZINE, lip balm, ondansetron (ZOFRAN) IV, oxyCODONE, traMADol  Assessment/Plan   1. Chronic Systolic CHF: S/p HM3 01/27/18.  Nonischemic cardiomyopathy, diagnosed in 2011, initially followed at [place]/[facility].  LHC (3/19) with no significant CAD.  CPX (3/19) submaximal, but suggestive of mild to moderate HF limitation. HIV negative 3/19, history of drug/ETOH abuse in the past, no drugs now and had cut back ETOH considerably.  Echo 9/19 showed EF 20% with severe LV dilation, severe MR.  NYHA class IV symptoms. He has a wide LBBB.  RHC was done in 9/19 showing low output, CI 1.4.  He was admitted to start milrinone, unable to wean off milrinone and remained on 0.25 mcg/kg/min at home.  Medtronic ICD was placed, he has a His bundle lead but EP was unable to get an LV lead in place => His bundle pacing does not narrow his QRS and did not help us wean him off milrinone.  He did well on milrinone 0.25 mcg/kg/min, NYHA class II.  RHC 10/11 shows filling pressures and cardiac output to be fairly well optimized. HM3 placed 10/14.  Now off milrinone post-op.  - Coox 44%, runs low in am. Repeat pending. CVP not connected. Pending.  - Continue lasix 80 mg IV BID.  - Continue losartan 25 mg BID. If remains lightheaded, may have to cut back.  - INR 1.37 this am.   On warfarin + heparin drip until INR > 1.8 again.  Now on ASA 81. - Continue walking unit.   - LDH pending.  2. ID: Treated with Augmentin for dental infection pre-op.  POD#1 had fever of 103.5. Started on Vanc/Unasyn.  Afebrile now. WBCs 14.8. - Cultures NGTD and off antibiotics.  He has had episodes of loose stool. - C diff negative.  3. ETOH Abuse: No longer drinking. No change.  4.   Tobacco abuse: He has quit smoking since admission in 9/19.  - Continue to encourage complete cessation. No change.  5. Mitral regurgitation: Severe functional MR.  With LV dilated > 7 cm, suspect Mitraclip would be unlikely to help him appreciably.  Decreased MR s/p LVAD placement.  No change.  6. VT: Patient had episode of VT requiring ATP prior to admission. Had VT for around 5 minutes 01/29/18.   - Continue po amiodarone.  No change.  7. Hypokalemia:  - CMET pending.  8. Hyponatremia:  - Na pending. If lower than 128 can consider tolvaptan.  9. Abnormal pacing  - Pacing in middle of QRS occasionally.  - [employer] rep to interrogate today  Transfer to stepdown today.   Michael Andrew Tillery, PA-C  02/04/2018 7:30 AM   VAD Team Pager [phone] (7am - 7am) ++VAD ISSUES ONLY++   Advanced Heart Failure Team Pager [phone] (M-F; 7a - 4p)  Please contact [facility] for night-coverage after hours (4p -7a ) and weekends on [email or web address]  Patient seen with NP, agree with the above note.  Co-ox low today at 41% but patient feels well.  CVP still 15.  Weight down with diuresis yesterday.  Walked in hall today without problems, breathing well.  Na lower at 125.  - I will give him tolvaptan today along with a dose of metolazone 2.5 x 1 and continue Lasix 80 mg IV bid.   - I am going to adjust his pump speed tomorrow under echo and RHC guidance. Will try to optimize pump speed to improve cardiac output.   INR remains low, continue heparin gtt until INR > 1.8.   MAP controlled today on losartan.    LVAD parameters reviewed and stable.   Kymorah Korf 02/04/2018 3:52 PM   

## 2018-02-04 NOTE — Progress Notes (Signed)
CSW met with patient at bedside. Patient reports he enjoyed the LVAD Support Group last night and hopes to continue attendance in the future. CSW discussed life with VAD and ongoing support through the outpatient program. Patient reports he is still unsure about housing concerns post discharge but will follow up with CSW when more information available. Patient appears to be motivated for recovery and feeling positive towards life with VAD. CSW continues to follow for support throughout implant hospitalization. Raquel Sarna, [place], [place]

## 2018-02-05 ENCOUNTER — Inpatient Hospital Stay (HOSPITAL_COMMUNITY): Payer: Medicaid Other

## 2018-02-05 ENCOUNTER — Encounter (HOSPITAL_COMMUNITY)
Admission: EM | Disposition: A | Discharge status: Home Health | Payer: Self-pay | Source: Home / Self Care | Attending: Cardiology

## 2018-02-05 ENCOUNTER — Encounter (HOSPITAL_COMMUNITY): Payer: Self-pay | Admitting: Cardiology

## 2018-02-05 DIAGNOSIS — I5022 Chronic systolic (congestive) heart failure: Secondary | ICD-10-CM

## 2018-02-05 DIAGNOSIS — I361 Nonrheumatic tricuspid (valve) insufficiency: Secondary | ICD-10-CM

## 2018-02-05 HISTORY — PX: RIGHT HEART CATH: CATH118263

## 2018-02-05 LAB — COOXEMETRY PANEL
Carboxyhemoglobin: 2.4 % — ABNORMAL HIGH (ref 0.5–1.5)
METHEMOGLOBIN: 1.3 % (ref 0.0–1.5)
O2 Saturation: 73.1 %
Total hemoglobin: 6.6 g/dL — CL (ref 12.0–16.0)

## 2018-02-05 LAB — POCT I-STAT 3, VENOUS BLOOD GAS (G3P V)
ACID-BASE EXCESS: 5 mmol/L — AB (ref 0.0–2.0)
ACID-BASE EXCESS: 6 mmol/L — AB (ref 0.0–2.0)
ACID-BASE EXCESS: 6 mmol/L — AB (ref 0.0–2.0)
ACID-BASE EXCESS: 5 mmol/L — AB (ref 0.0–2.0)
ACID-BASE EXCESS: 5 mmol/L — AB (ref 0.0–2.0)
Acid-Base Excess: 6 mmol/L — ABNORMAL HIGH (ref 0.0–2.0)
Acid-Base Excess: 5 mmol/L — ABNORMAL HIGH (ref 0.0–2.0)
Acid-Base Excess: 6 mmol/L — ABNORMAL HIGH (ref 0.0–2.0)
BICARBONATE: 29.3 mmol/L — AB (ref 20.0–28.0)
BICARBONATE: 30 mmol/L — AB (ref 20.0–28.0)
BICARBONATE: 30.5 mmol/L — AB (ref 20.0–28.0)
Bicarbonate: 30.4 mmol/L — ABNORMAL HIGH (ref 20.0–28.0)
Bicarbonate: 29.1 mmol/L — ABNORMAL HIGH (ref 20.0–28.0)
Bicarbonate: 29.1 mmol/L — ABNORMAL HIGH (ref 20.0–28.0)
Bicarbonate: 29.8 mmol/L — ABNORMAL HIGH (ref 20.0–28.0)
Bicarbonate: 31 mmol/L — ABNORMAL HIGH (ref 20.0–28.0)
O2 SAT: 55 %
O2 SAT: 53 %
O2 SAT: 56 %
O2 Saturation: 53 %
O2 Saturation: 53 %
O2 Saturation: 53 %
O2 Saturation: 55 %
O2 Saturation: 59 %
PCO2 VEN: 41.9 mmHg — AB (ref 44.0–60.0)
PH VEN: 7.45 — AB (ref 7.250–7.430)
PH VEN: 7.454 — AB (ref 7.250–7.430)
PH VEN: 7.462 — AB (ref 7.250–7.430)
PH VEN: 7.443 — AB (ref 7.250–7.430)
PH VEN: 7.45 — AB (ref 7.250–7.430)
PH VEN: 7.453 — AB (ref 7.250–7.430)
PO2 VEN: 28 mmHg — AB (ref 32.0–45.0)
PO2 VEN: 30 mmHg — AB (ref 32.0–45.0)
TCO2: 31 mmol/L (ref 22–32)
TCO2: 31 mmol/L (ref 22–32)
TCO2: 32 mmol/L (ref 22–32)
TCO2: 32 mmol/L (ref 22–32)
TCO2: 30 mmol/L (ref 22–32)
TCO2: 30 mmol/L (ref 22–32)
TCO2: 31 mmol/L (ref 22–32)
TCO2: 32 mmol/L (ref 22–32)
pCO2, Ven: 42 mmHg — ABNORMAL LOW (ref 44.0–60.0)
pCO2, Ven: 42.1 mmHg — ABNORMAL LOW (ref 44.0–60.0)
pCO2, Ven: 42.9 mmHg — ABNORMAL LOW (ref 44.0–60.0)
pCO2, Ven: 43.5 mmHg — ABNORMAL LOW (ref 44.0–60.0)
pCO2, Ven: 42.5 mmHg — ABNORMAL LOW (ref 44.0–60.0)
pCO2, Ven: 42.6 mmHg — ABNORMAL LOW (ref 44.0–60.0)
pCO2, Ven: 44.4 mmHg (ref 44.0–60.0)
pH, Ven: 7.459 — ABNORMAL HIGH (ref 7.250–7.430)
pH, Ven: 7.454 — ABNORMAL HIGH (ref 7.250–7.430)
pO2, Ven: 27 mmHg — CL (ref 32.0–45.0)
pO2, Ven: 27 mmHg — CL (ref 32.0–45.0)
pO2, Ven: 27 mmHg — CL (ref 32.0–45.0)
pO2, Ven: 27 mmHg — CL (ref 32.0–45.0)
pO2, Ven: 28 mmHg — CL (ref 32.0–45.0)
pO2, Ven: 28 mmHg — CL (ref 32.0–45.0)

## 2018-02-05 LAB — COMPREHENSIVE METABOLIC PANEL
ALK PHOS: 61 U/L (ref 38–126)
ALT: 16 U/L (ref 0–44)
ANION GAP: 11 (ref 5–15)
AST: 22 U/L (ref 15–41)
Albumin: 3 g/dL — ABNORMAL LOW (ref 3.5–5.0)
BUN: 14 mg/dL (ref 6–20)
CALCIUM: 9.1 mg/dL (ref 8.9–10.3)
CO2: 30 mmol/L (ref 22–32)
Chloride: 85 mmol/L — ABNORMAL LOW (ref 98–111)
Creatinine, Ser: 0.86 mg/dL (ref 0.61–1.24)
GFR calc non Af Amer: >60 mL/min (ref >60)
Glucose, Bld: 103 mg/dL — ABNORMAL HIGH (ref 70–99)
Potassium: 3.7 mmol/L (ref 3.5–5.1)
SODIUM: 126 mmol/L — AB (ref 135–145)
Total Bilirubin: 0.7 mg/dL (ref 0.3–1.2)
Total Protein: 6.7 g/dL (ref 6.5–8.1)

## 2018-02-05 LAB — FERRITIN: FERRITIN: 362 ng/mL — AB (ref 24–336)

## 2018-02-05 LAB — GLUCOSE, CAPILLARY
GLUCOSE-CAPILLARY: 100 mg/dL — AB (ref 70–99)
GLUCOSE-CAPILLARY: 136 mg/dL — AB (ref 70–99)
GLUCOSE-CAPILLARY: 148 mg/dL — AB (ref 70–99)
Glucose-Capillary: 117 mg/dL — ABNORMAL HIGH (ref 70–99)

## 2018-02-05 LAB — CBC
HEMATOCRIT: 24.1 % — AB (ref 39.0–52.0)
HEMOGLOBIN: 8.3 g/dL — AB (ref 13.0–17.0)
MCH: 31.1 pg (ref 26.0–34.0)
MCHC: 34.4 g/dL (ref 30.0–36.0)
MCV: 90.3 fL (ref 80.0–100.0)
Platelets: 303 10*3/uL (ref 150–400)
RBC: 2.67 MIL/uL — AB (ref 4.22–5.81)
RDW: 13.2 % (ref 11.5–15.5)
WBC: 12.5 10*3/uL — ABNORMAL HIGH (ref 4.0–10.5)
nRBC: 0 % (ref 0.0–0.2)

## 2018-02-05 LAB — PROTIME-INR
INR: 1.3
PROTHROMBIN TIME: 16 s — AB (ref 11.4–15.2)

## 2018-02-05 LAB — IRON AND TIBC
IRON: 23 ug/dL — AB (ref 45–182)
Saturation Ratios: 10 % — ABNORMAL LOW (ref 17.9–39.5)
TIBC: 238 ug/dL — ABNORMAL LOW (ref 250–450)
UIBC: 215 ug/dL

## 2018-02-05 LAB — HEPARIN LEVEL (UNFRACTIONATED)
HEPARIN UNFRACTIONATED: 0.42 [IU]/mL (ref 0.30–0.70)
HEPARIN UNFRACTIONATED: 0.26 [IU]/mL — AB (ref 0.30–0.70)

## 2018-02-05 LAB — ECHOCARDIOGRAM LIMITED
HEIGHTINCHES: 67 in
Weight: 2256 oz

## 2018-02-05 LAB — LACTATE DEHYDROGENASE: LDH: 239 U/L — AB (ref 98–192)

## 2018-02-05 LAB — MAGNESIUM: MAGNESIUM: 1.9 mg/dL (ref 1.7–2.4)

## 2018-02-05 SURGERY — RIGHT HEART CATH
Anesthesia: LOCAL

## 2018-02-05 MED ORDER — SODIUM CHLORIDE 0.9% FLUSH
3.0000 mL | Freq: Two times a day (BID) | INTRAVENOUS | Status: DC
  Administered 2018-02-05 – 2018-02-09 (×8): 3 mL via INTRAVENOUS

## 2018-02-05 MED ORDER — SODIUM CHLORIDE 0.9 % IV SOLN
510.0000 mg | INTRAVENOUS | Status: AC
  Administered 2018-02-05 – 2018-02-08 (×2): 510 mg via INTRAVENOUS
  Filled 2018-02-05 (×2): qty 17

## 2018-02-05 MED ORDER — FENTANYL CITRATE (PF) 100 MCG/2ML IJ SOLN
INTRAMUSCULAR | Status: AC
  Filled 2018-02-05: qty 2

## 2018-02-05 MED ORDER — HEPARIN (PORCINE) IN NACL 1000-0.9 UT/500ML-% IV SOLN
INTRAVENOUS | Status: AC
  Filled 2018-02-05: qty 500

## 2018-02-05 MED ORDER — HEPARIN (PORCINE) IN NACL 100-0.45 UNIT/ML-% IJ SOLN
1950.0000 [IU]/h | INTRAMUSCULAR | Status: DC
  Administered 2018-02-05 – 2018-02-06 (×2): 1950 [IU]/h via INTRAVENOUS
  Filled 2018-02-05 (×2): qty 250

## 2018-02-05 MED ORDER — WARFARIN SODIUM 7.5 MG PO TABS
7.5000 mg | ORAL_TABLET | Freq: Once | ORAL | Status: AC
  Administered 2018-02-05: 7.5 mg via ORAL
  Filled 2018-02-05: qty 1

## 2018-02-05 MED ORDER — LIDOCAINE HCL (PF) 1 % IJ SOLN
INTRAMUSCULAR | Status: AC
  Filled 2018-02-05: qty 30

## 2018-02-05 MED ORDER — SODIUM CHLORIDE 0.9 % IV SOLN: 250.0000 mL | INTRAVENOUS | Status: DC | PRN

## 2018-02-05 MED ORDER — MAGNESIUM SULFATE 2 GM/50ML IV SOLN
2.0000 g | Freq: Once | INTRAVENOUS | Status: AC
  Administered 2018-02-05: 2 g via INTRAVENOUS
  Filled 2018-02-05: qty 50

## 2018-02-05 MED ORDER — FENTANYL CITRATE (PF) 100 MCG/2ML IJ SOLN
INTRAMUSCULAR | Status: DC | PRN
  Administered 2018-02-05: 25 ug via INTRAVENOUS

## 2018-02-05 MED ORDER — POTASSIUM CHLORIDE CRYS ER 20 MEQ PO TBCR
40.0000 meq | EXTENDED_RELEASE_TABLET | Freq: Once | ORAL | Status: AC
  Administered 2018-02-05: 40 meq via ORAL
  Filled 2018-02-05: qty 2

## 2018-02-05 MED ORDER — SODIUM CHLORIDE 0.9% FLUSH: 3.0000 mL | INTRAVENOUS | Status: DC | PRN

## 2018-02-05 MED ORDER — MIDAZOLAM HCL 2 MG/2ML IJ SOLN
INTRAMUSCULAR | Status: DC | PRN
  Administered 2018-02-05: 1 mg via INTRAVENOUS

## 2018-02-05 MED ORDER — METOLAZONE 2.5 MG PO TABS
2.5000 mg | ORAL_TABLET | Freq: Once | ORAL | Status: AC
  Administered 2018-02-05: 2.5 mg via ORAL
  Filled 2018-02-05: qty 1

## 2018-02-05 MED ORDER — ONDANSETRON HCL 4 MG/2ML IJ SOLN: 4.0000 mg | Freq: Four times a day (QID) | INTRAMUSCULAR | Status: DC | PRN

## 2018-02-05 MED ORDER — MIDAZOLAM HCL 2 MG/2ML IJ SOLN
INTRAMUSCULAR | Status: AC
  Filled 2018-02-05: qty 2

## 2018-02-05 MED ORDER — LIDOCAINE HCL (PF) 1 % IJ SOLN
INTRAMUSCULAR | Status: DC | PRN
  Administered 2018-02-05: 15 mL

## 2018-02-05 MED ORDER — SILDENAFIL CITRATE 20 MG PO TABS
20.0000 mg | ORAL_TABLET | Freq: Three times a day (TID) | ORAL | Status: DC
  Administered 2018-02-05 – 2018-02-10 (×16): 20 mg via ORAL
  Filled 2018-02-05 (×16): qty 1

## 2018-02-05 MED ORDER — ACETAMINOPHEN 325 MG PO TABS
650.0000 mg | ORAL_TABLET | ORAL | Status: DC | PRN
  Administered 2018-02-05: 650 mg via ORAL

## 2018-02-05 MED ORDER — HEPARIN (PORCINE) IN NACL 1000-0.9 UT/500ML-% IV SOLN
INTRAVENOUS | Status: DC | PRN
  Administered 2018-02-05 (×2): 500 mL

## 2018-02-05 MED ORDER — TOLVAPTAN 15 MG PO TABS
30.0000 mg | ORAL_TABLET | ORAL | Status: AC
  Administered 2018-02-05: 30 mg via ORAL
  Filled 2018-02-05: qty 2

## 2018-02-05 SURGICAL SUPPLY — 6 items
CATH SWAN GANZ 7F STRAIGHT (CATHETERS) ×2 IMPLANT
KIT HEART LEFT (KITS) ×2 IMPLANT
PACK CARDIAC CATHETERIZATION (CUSTOM PROCEDURE TRAY) ×2 IMPLANT
SHEATH PINNACLE 7F 10CM (SHEATH) ×2 IMPLANT
TRANSDUCER W/STOPCOCK (MISCELLANEOUS) ×2 IMPLANT
TUBING CIL FLEX 10 FLL-RA (TUBING) ×2 IMPLANT

## 2018-02-05 NOTE — Progress Notes (Signed)
PT Cancellation Note  Patient Details Name: Carl Ferguson MRN: [id number] DOB: 11/19/1965   Cancelled Treatment:    Reason Eval/Treat Not Completed: Patient at procedure or test/unavailable, in cath lab   [facility] 02/05/2018, 8:35 AM

## 2018-02-05 NOTE — Progress Notes (Addendum)
ANTICOAGULATION CONSULT NOTE - Follow Up Consult  Pharmacy Consult for Heparin and Coumadin Indication: LVAD  Allergies  Allergen Reactions  . Norflex [Orphenadrine] Swelling    FACE [ANGIOEDEMA]    Patient Measurements: Height: 5\' 7"  (170.2 cm) Weight: 141 lb (64 kg) IBW/kg (Calculated) : 66.1 Heparin Dosing Weight: 65.9 kg  Vital Signs: BP: 99/78 (10/23 1020) Pulse Rate: 58 (10/23 1020)  Labs: Recent Labs    02/03/18 0420 02/03/18 0837  02/04/18 0500 02/04/18 1550 02/04/18 2300 02/05/18 0349 02/05/18 0350 02/05/18 0846  HGB 10.0*  --   --  9.9*  --   --  7.9*  --  8.3*  HCT 29.7*  --   --  29.1*  --   --  23.1*  --  24.1*  PLT 302  --   --  388  --   --  329  --  303  LABPROT  --  16.8*  --  16.7*  --   --  16.0*  --   --   INR  --  1.38  --  1.37  --   --  1.30  --   --   HEPARINUNFRC  --  <0.10*   < > 0.11* 0.13* 0.42  --  0.26*  --   CREATININE 0.79  --   --  1.02  --   --  0.86  --   --    < > = values in this interval not displayed.    Estimated Creatinine Clearance: 92 mL/min (by C-G formula based on SCr of 0.86 mg/dL).  Assessment: 52 yr old male s/p HM3 on 10/14.   On heparin and Coumadin.  Coumadin held on 10/18 after significant jump in INR 1.3>2.24.  INR remains subtherapeutic at 1.30 s/p warfarin 5 mg x1 yesterday, 4 mg the day prior. Hb 7.9, rechecked 8.3. No signs/symptoms of bleeding, reports coughing up small amounts of blood. No further concerns in the afternoon. LDH relatively stable.    Underwent RHC today, sheath removed at ~1030. Heparin previously near therapeutic at 0.26 on heparin 1950 units/hr. Okay to resume heparin ~6 hours after sheath removed.    Goal of Therapy:  Heparin level 0.3-0.5 units/ml  INR 2-2.5 Monitor platelets by anticoagulation protocol: Yes   Plan:  - Restart heparin drip at 1950 units/hr at 1630, no bolus. Recheck heparin level in 6 hours - Warfarin 7.5 mg x1  - Daily heparin level, PT/INR and CBC with am  labs.   Marcelino Freestone, PharmD PGY2 Cardiology Pharmacy Resident Phone [phone] Please check AMION for all Pharmacist numbers by unit 02/05/2018 11:33 AM

## 2018-02-05 NOTE — Progress Notes (Addendum)
Patient ID: Carl Ferguson, male   DOB: 01-29-1966, 52 y.o.   MRN: [id number]     Advanced Heart Failure Rounding Note  PCP-Cardiologist: Garwin Brothers, MD   Subjective:    Events 10/11 RHC - Stable on milrinone 0.25 10/14 HM3 implantation; 2 units FFP. CTs and pressors in place out of OR.  10/14 pm. Speed reduced to 5600 after large UOP and many PI events.  10/15 spiked fever up to 103.5. Started on vanc/unasyn. Cxs sent.  Coox 73.1% this am. CVP measured at 18 prior to disconnecting for cath. ? Accuracy, but will have better numbers from RHC.   WBC 13.3 -> 14.8 -> 13.5. Tmax 99.6. Not on ABX  Hgb 9.9 -> 7.9.   INR 1.30. On coumadin and heparin drip.   Feeling ok this am. Heading to cath. Denies SOB. No lightheadedness or dizziness. Says he coughed up scant amounts of blood yesterday. Denis blood in stool.   LVAD Interrogation HM 3: Speed: 5600 Flow: 5.1 PI: 2.0 Power: 4.0. - VAD interrogated personally. Parameters stable.   Objective:   Weight Range: 64 kg Body mass index is 22.08 kg/m.   Vital Signs:   Temp:  [97.4 F (36.3 C)-99.6 F (37.6 C)] 97.4 F (36.3 C) (10/22 2325) Pulse Rate:  [56-110] 56 (10/22 2325) Resp:  [17-20] 17 (10/22 1544) BP: (85-106)/(60-88) 87/77 (10/23 0352) SpO2:  [95 %-100 %] 99 % (10/22 2325) Weight:  [64 kg] 64 kg (10/23 0500) Last BM Date: 02/02/18  Weight change: Filed Weights   02/03/18 0600 02/04/18 0500 02/05/18 0500  Weight: 65.9 kg 65.2 kg 64 kg   Intake/Output:   Intake/Output Summary (Last 24 hours) at 02/05/2018 0805 Last data filed at 02/05/2018 0500 Gross per 24 hour  Intake 765.69 ml  Output 1200 ml  Net -434.31 ml    Physical Exam    General: Well appearing this am. NAD.  HEENT: Normal. Neck: Supple, JVP to jaw Carotids OK.  Cardiac:  Mechanical heart sounds with LVAD hum present.  Lungs:  CTAB, normal effort.  Abdomen:  NT, ND, no HSM. No bruits or masses. +BS  LVAD exit site: Dressing dry and intact.  No erythema or drainage. Stabilization device present and accurately applied. Driveline dressing changed daily per sterile technique. Extremities:  Warm and dry. No cyanosis, clubbing, rash, or edema.  Neuro:  Alert & oriented x 3. Cranial nerves grossly intact. Moves all 4 extremities w/o difficulty. Affect pleasant     Telemetry   V paced 100-110s, personally reviewed.   Labs    CBC Recent Labs    02/03/18 0420 02/04/18 0500 02/05/18 0349  WBC 13.3* 14.8* 13.5*  NEUTROABS 9.7*  --   --   HGB 10.0* 9.9* 7.9*  HCT 29.7* 29.1* 23.1*  MCV 91.7 90.9 90.9  PLT 302 388 329   Basic Metabolic Panel Recent Labs    47/82/95 0420 02/04/18 0500 02/05/18 0349 02/05/18 0656  NA 128* 125* 126*  --   K 3.9 4.1 3.7  --   CL 91* 83* 85*  --   CO2 28 28 30   --   GLUCOSE 108* 109* 103*  --   BUN 8 14 14   --   CREATININE 0.79 1.02 0.86  --   CALCIUM 9.1 9.4 9.1  --   MG 2.0  --   --  1.9   Liver Function Tests Recent Labs    02/04/18 0500 02/05/18 0349  AST 25 22  ALT 15  16  ALKPHOS 67 61  BILITOT 1.0 0.7  PROT 7.4 6.7  ALBUMIN 3.3* 3.0*   No results for input(s): LIPASE, AMYLASE in the last 72 hours. Cardiac Enzymes No results for input(s): CKTOTAL, CKMB, CKMBINDEX, TROPONINI in the last 72 hours.  BNP: BNP (last 3 results) Recent Labs    12/30/17 0858 01/28/18 0542 02/03/18 0045  BNP 4,087.0* 235.0* 549.1*    ProBNP (last 3 results) No results for input(s): PROBNP in the last 8760 hours.   D-Dimer No results for input(s): DDIMER in the last 72 hours. Hemoglobin A1C No results for input(s): HGBA1C in the last 72 hours. Fasting Lipid Panel No results for input(s): CHOL, HDL, LDLCALC, TRIG, CHOLHDL, LDLDIRECT in the last 72 hours. Thyroid Function Tests No results for input(s): TSH, T4TOTAL, T3FREE, THYROIDAB in the last 72 hours.  Invalid input(s): FREET3  Other results:   Imaging    No results found.   Medications:     Scheduled  Medications: . amiodarone  200 mg Oral BID  . aspirin EC  81 mg Oral Daily  . bisacodyl  10 mg Oral Daily   Or  . bisacodyl  10 mg Rectal Daily  . Chlorhexidine Gluconate Cloth  6 each Topical Daily  . docusate sodium  200 mg Oral Daily  . feeding supplement (ENSURE ENLIVE)  237 mL Oral TID BM  . furosemide  80 mg Intravenous BID  . gabapentin  400 mg Oral BID  . insulin aspart  0-24 Units Subcutaneous TID AC & HS  . losartan  25 mg Oral BID  . mouth rinse  15 mL Mouth Rinse BID  . pantoprazole  40 mg Oral Daily  . potassium chloride  40 mEq Oral BID  . sodium chloride flush  10-40 mL Intracatheter Q12H  . sodium chloride flush  3 mL Intravenous Q12H  . sodium chloride flush  3 mL Intravenous Q12H  . Warfarin - Pharmacist Dosing Inpatient   Does not apply q1800    Infusions: . sodium chloride Stopped (02/03/18 1007)  . sodium chloride    . sodium chloride 10 mL/hr at 02/05/18 0610  . heparin 1,950 Units/hr (02/05/18 0458)    PRN Medications: sodium chloride, sodium chloride, acetaminophen, hydrALAZINE, lip balm, ondansetron (ZOFRAN) IV, oxyCODONE, sodium chloride flush, traMADol  Assessment/Plan   1. Chronic Systolic CHF: S/p HM3 01/27/18.  Nonischemic cardiomyopathy, diagnosed in 2011, initially followed at [place]/[facility].  LHC (3/19) with no significant CAD.  CPX (3/19) submaximal, but suggestive of mild to moderate HF limitation. HIV negative 3/19, history of drug/ETOH abuse in the past, no drugs now and had cut back ETOH considerably.  Echo 9/19 showed EF 20% with severe LV dilation, severe MR.  NYHA class IV symptoms. He has a wide LBBB.  RHC was done in 9/19 showing low output, CI 1.4.  He was admitted to start milrinone, unable to wean off milrinone and remained on 0.25 mcg/kg/min at home.  Medtronic ICD was placed, he has a His bundle lead but EP was unable to get an LV lead in place => His bundle pacing does not narrow his QRS and did not help [place] wean him off milrinone.  He  did well on milrinone 0.25 mcg/kg/min, NYHA class II.  RHC 10/11 shows filling pressures and cardiac output to be fairly well optimized. HM3 placed 10/14.  Now off milrinone post-op.  - Coox 73.1%. Hgb on Coox panel 6.6, Hgb on CBC 7.9. ? Accuracy of sample.  - Will adjust  diuretic based on cath numbers.  - Continue losartan 25 mg BID. If remains lightheaded, may have to cut back.  - INR 1.30.  On warfarin + heparin drip until INR > 1.8.  Now on ASA 81. - Continue walking.  - LDH 239.  2. ID: Treated with Augmentin for dental infection pre-op.  POD#1 had fever of 103.5. Started on Vanc/Unasyn.  Afebrile now. WBCs 13.5. Tmax 99.6. - Cultures NGTD and off antibiotics.  He has had episodes of loose stool. - C diff negative.  3. ETOH Abuse: No longer drinking. No change.  4. Tobacco abuse: He has quit smoking since admission in 9/19.  - Continue to encourage complete cessation. No change.   5. Mitral regurgitation: Severe functional MR.  With LV dilated > 7 cm, suspect Mitraclip would be unlikely to help him appreciably.  Decreased MR s/p LVAD placement.  No change.  6. VT: Patient had episode of VT requiring ATP prior to admission. Had VT for around 5 minutes 01/29/18.   - Continue po amiodarone.  No change.  7. Hypokalemia:  - K 3.7.  8. Hyponatremia:  - Na 125 -> 126 with dose of Tolvaptan.  For RHC with ramp this am.   Graciella Freer, PA-C  02/05/2018 8:05 AM   VAD Team Pager [phone] (7am - 7am) ++VAD ISSUES ONLY++   Advanced Heart Failure Team Pager [phone] (M-F; 7a - 4p)  Please contact [facility] for night-coverage after hours (4p -7a ) and weekends on [email or web address]   Patient seen with PA, agree with the above note.  LVAD speed adjustment done today hemodynamic evaluation by RHC and simultaneous echo evaluation.  Speed thought to be optimized at 5700 rpm.  There was a moderate, loculated pericardial effusion along the lateral wall of the LV.  The RV systolic function  was moderately depressed.    Additionally, hemoglobin was noted to be lower today at 7.9 but no overt bleeding. Repeat hgb 8.3.   Na 126 today.  INR still low at 1.3.   RHC Procedural Findings: Hemodynamics (mmHg) at 5600 rpm RA 14 RV 36/14 PA 40/14, mean 25 PCWP mean 13 Oxygen saturations: PA 57% AO 97% Cardiac Output (Fick) 5.4  Cardiac Index (Fick) 3.1 PVR 2.2 WU Cardiac Output (Thermo) 5.8 Cardiac Index (Thermo) 3.3 PAPI 1.85  On exam, JVP 10-12 cm.  Normal LVAD sounds.  No edema.   PCWP optimized, patient has evidence for RV dysfunction with elevated RA pressure, PAPI 1.85.  - Will add sildenafil 20 mg tid.  - Continue diuretics today with Lasix 80 mg IV bid, metolazone x 1, and tolvaptan 30 mg daily as Na is still 126.  Probably to po diuretics tomorrow.   Restart heparin gtt, will need to adjust warfarin as INR still low.   No overt bleeding though Hgb lower.  At 8.3, no transfusion.  Will send Fe studies.   CRITICAL CARE Performed by: Marca Ancona  Total critical care time: 45 minutes  Critical care time was exclusive of separately billable procedures and treating other patients.  Critical care was necessary to treat or prevent imminent or life-threatening deterioration.  Critical care was time spent personally by me on the following activities: development of treatment plan with patient and/or surrogate as well as nursing, discussions with consultants, evaluation of patient's response to treatment, examination of patient, obtaining history from patient or surrogate, ordering and performing treatments and interventions, ordering and review of laboratory studies, ordering and review of radiographic studies, pulse  oximetry and re-evaluation of patient's condition.  Marca Ancona 02/05/2018 10:28 AM

## 2018-02-05 NOTE — Progress Notes (Signed)
Anterior chest surgical site painted with betadine. 

## 2018-02-05 NOTE — Progress Notes (Signed)
  Echocardiogram 2D Echocardiogram has been performed.  Roosvelt Maser F 02/05/2018, 10:09 AM

## 2018-02-05 NOTE — Progress Notes (Signed)
VAD Coordinator Procedure Note:   VAD Coordinator met patient in cath lab. Pt undergoing right heart cath per Dr. Aundra Dubin. Hemodynamics and VAD parameters monitored by myself and anesthesia throughout the procedure. Blood pressures were obtained with automatic cuff on left arm, and swan, and correlated with Doppler.    Time: Doppler Auto  BP Flow PI Power Speed  Pre-procedure:  0830  117/93 (103) 4.8 3.0 4.2 5600   0845  101/81 (86) 4.9 3.2 4.1 5600           Secation Induction: 0900  103/78 (89) 4.9 2.2 4.1 5600   0915  101/823 (87) 5.0 1.9 4.2 5700   0930  99/79 (85) 5.3 1.6 4.6 5900           Recovery Area: 1000  94/75 (80) 5.0 2.7 4.3 5700               Speed  Flow  PI  Power  LVIDD  AI  Aortic openings  MR  TR  Septum  RV  CardiacOutput Cardiac Index Flick Wedge PA  9977  4.9 2.2 4.1 3.9 mild 0/5 trivial  mild midline normal  6.2 3.56 6.2/ 3.1 13   5700  4.9 1.9 4.3 3.01 mild 0/5 trivial mild midline  5.80 3.3 4.9/  2.81 13   5800  5.0 1.8 4.4 2.76 mild 0/5 mild mild Pulling left  5.69 3.26 5.02/ 2.88 10 43/12  5900  5.1 1.7 4.5 2.43 mild 0/5 mild mild Pulling left  5.82 3.34  11 38/15                                         Ramp ECHO performed in cath lab. Small effusion during echo.  At completion of ramp study, patients primary controller programmed:  Fixed speed: 5700  Low speed limit: 5400  Patient tolerated the procedure well. VAD Coordinator accompanied to recovery area with sheath in right groin. Christy RN from [facility] met [place] in recovery area and assumed care of patient.     Emerson Monte RN [place] Coordinator  Office: [phone]  24/7 Pager: [phone]

## 2018-02-05 NOTE — Progress Notes (Signed)
LVAD Coordinator Rounding Note:  Admitted 01/18/18 due to fever and chills. Started on broad spectrum antibiotics. Teeth extractions done by Dr. Enrique Sack 01/20/18. RHC done 01/24/18.   HM III LVAD implanted on 01/27/18 by Dr. Prescott Gum under Destination Therapy criteria.  Met patient in cath lab for right heart cath and RAMP echo. Patient alert and in good spirits this morning.   Vital signs: Temp: 98.3 HR: 90 AV-paced Cuff BP:  117/93 (103) Doppler: 80 O2 Sat: 99% on RA Wt: 137.5>158.7>149.9>147.2>147.9>144.9>143.7>140.8 lbs   LVAD interrogation reveals:  Speed: 5600 Flow: 5.0 Power: 4.2w PI: 2.8 Alarms: none Events: 6 PI Hematocrit: 29  Fixed speed: 5600 Low speed limit: 5300   Drive Line:  Left lower abdominal dressing clean, dry, intact. Will change this afternoon.    Labs:  LDH trend: 282>324>263>262>472>263>239   INR trend: 1.26>1.33>1.31>1.30>2.0>2.71>1.37>1.30  WBC trend: 8.6>15.6>16.3>14.5>10.4>11.1>14.8>12.5  Anticoagulation Plan: -INR Goal:  2.0 - 2.5 -ASA Dose: ASA 336m daily until INR therapeutic  Blood Products:  -Intraop: 4 FFP; 450 cell saver   - Post op:  01/27/18>>1 PRBC  Device: - Medtronic ICD - Pacing: DDD 60 - Therapies: turned on 02/04/18  Drips: Milrinone 0.250m/kg/min--off 01/30/18 Amiodarone 3049mr--restarted 01/30/18--transitioned to PO 01/31/18 Lasix 8mg56m--off Heparin--started 01/30/18--off 01/31/18  Arrythmias: 01/29/18- VT for roughly 5 minutes.  Amio bolus given.  Respiratory: extubated 01/28/18   Infection: Blood/urine/sputum cultures ordered due to fever 101.3.  - 01/28/18 BC's>> NTD -  01/28/18 urine cultures>>NTD  Adverse Events on VAD: -   VAD Education:  1. Asked patient to speak with girlfriend Margret to find out when she would be available for dressing change/discharge  teaching. Per patient, she is on "2nd shift" today and she would not be by until late this evening.     Plan/Recommendations:   1. Daily dressing changes per VAD RN, nurse champion, or trained caregiver.  2. VAD RN to accompany patient to cath lab for procedure.  3. Call VAD pager with any equipment or drive line questions, or needs.   AlliEmerson MonteVAD Dry Ridgerdinator  Office: [phone] Pager: [phone]

## 2018-02-05 NOTE — Progress Notes (Signed)
Physical Therapy Treatment Patient Details Name: Carl Ferguson MRN: [id number] DOB: 03-23-1966 Today's Date: 02/05/2018    History of Present Illness Pt is a 52 y.o. male admitted 01/18/18 with fever. S/p Heartmate 3 LVAD placement on 10/14. PMH includes ICD recently placed 0/86/76, systolic CHF, NICM, chronic LBBB, tobacco abuse, prior ETOH abuse, prior substance abuse, h/o of device placements with extractions due to infection.     PT Comments    Patient seen for activity progression. Patient ambulating independently without difficulty, managing his own equipment and performed stair negotiation independently as well. At this time, patient has met all mobility goals for PT services. Do no feel patient continues to require skilled PT for mobility as he has been mobilizing with staff several times a day. Will sign off PT services at this time, please re consult if status changes or new needs arise.   Follow Up Recommendations  Home health PT;Supervision/Assistance - 24 hour     Equipment Recommendations  3in1 (PT)(rollator)    Recommendations for Other Services       Precautions / Restrictions Precautions Precautions: Fall;Sternal Precaution Comments: Good recall of sternal precautions with mobility. connected his own device without difficulty Restrictions Weight Bearing Restrictions: Yes(sternal precautions)    Mobility  Bed Mobility Overal bed mobility: Independent Bed Mobility: Sit to Supine           General bed mobility comments: no physical assist or cues required  Transfers Overall transfer level: Independent Equipment used: None Transfers: Sit to/from Stand Sit to Stand: Independent         General transfer comment: no physical assist or cues required  Ambulation/Gait Ambulation/Gait assistance: Independent Gait Distance (Feet): 900 Feet Assistive device: IV Pole     Gait velocity interpretation: 1.31 - 2.62 ft/sec, indicative of limited community  ambulator     Stairs Stairs: Yes Stairs assistance: Modified independent (Device/Increase time) Stair Management: One rail Left Number of Stairs: 5 General stair comments: no difficulty   Wheelchair Mobility    Modified Rankin (Stroke Patients Only)       Balance Overall balance assessment: Needs assistance Sitting-balance support: No upper extremity supported;Feet supported Sitting balance-Leahy Scale: Good     Standing balance support: No upper extremity supported Standing balance-Leahy Scale: Good Standing balance comment: no physical assist or external support                            Cognition Arousal/Alertness: Awake/alert Behavior During Therapy: WFL for tasks assessed/performed Overall Cognitive Status: Within Functional Limits for tasks assessed                                        Exercises      General Comments        Pertinent Vitals/Pain Faces Pain Scale: Hurts little more Pain Location: chest, feet and drive line site Pain Descriptors / Indicators: Aching;Guarding;Grimacing    Home Living                      Prior Function            PT Goals (current goals can now be found in the care plan section) Acute Rehab PT Goals Patient Stated Goal: to go home PT Goal Formulation: With patient Time For Goal Achievement: 02/12/18 Potential to Achieve Goals: Good Progress towards PT goals:  Goals met/education completed, patient discharged from PT    Frequency    Min 1X/week      PT Plan Current plan remains appropriate;Frequency needs to be updated    Co-evaluation              AM-PAC PT "6 Clicks" Daily Activity  Outcome Measure  Difficulty turning over in bed (including adjusting bedclothes, sheets and blankets)?: None Difficulty moving from lying on back to sitting on the side of the bed? : None Difficulty sitting down on and standing up from a chair with arms (e.g., wheelchair,  bedside commode, etc,.)?: None Help needed moving to and from a bed to chair (including a wheelchair)?: None Help needed walking in hospital room?: None Help needed climbing 3-5 steps with a railing? : A Little 6 Click Score: 23    End of Session   Activity Tolerance: Patient tolerated treatment well Patient left: in bed;with call bell/phone within reach Nurse Communication: Mobility status PT Visit Diagnosis: Unsteadiness on feet (R26.81);Muscle weakness (generalized) (M62.81) Pain - part of body: (sternum)     Time: 7078-6754 PT Time Calculation (min) (ACUTE ONLY): 15 min  Charges:  $Gait Training: 8-22 mins                     Carl Ferguson, PT DPT  Board Certified Neurologic Specialist [place] Pager [phone] Office [phone]    Carl Ferguson 02/05/2018, 3:40 PM

## 2018-02-05 NOTE — Progress Notes (Signed)
OT Cancellation Note  Patient Details Name: Carl Ferguson MRN: [id number] DOB: 10/14/1965   Cancelled Treatment:    Reason Eval/Treat Not Completed: Patient at procedure or test/ unavailable(Pt in cath lab.)  Evern Bio 02/05/2018, 8:31 AM  Martie Round, OTR/L Acute Rehabilitation Services Pager: [phone] Office: [phone]

## 2018-02-05 NOTE — Progress Notes (Signed)
CSW met with patient at bedside. Patient reported concerns about housing plans and asked for a list of available housing options. CSW provided a list from [employer] and patient will follow up. Patient grateful for assistance. CSW will continue to follow throughout implant hospitalization. Carl Ferguson, [place], [place]

## 2018-02-05 NOTE — Progress Notes (Signed)
Transported to the cath lab by bed awake and alert. 

## 2018-02-05 NOTE — Progress Notes (Signed)
Site area: rt groin fv sheath Site Prior to Removal:  Level 0 Pressure Applied For: 10 minutes Manual:   yes Patient Status During Pull:  stable Post Pull Site:  Level  0 Post Pull Instructions Given:  yes Post Pull Pulses Present: rt dp palpable Dressing Applied:  Gauze and tegaderm Bedrest begins @  1020 Comments:

## 2018-02-05 NOTE — Progress Notes (Signed)
Pt. Instructed to have clear liquid diet as ordered, found to be eating soup Cfull liquid) brought in by visitor.claimed " I need to eat ". Further diet education given.

## 2018-02-05 NOTE — Interval H&P Note (Signed)
History and Physical Interval Note:  02/05/2018 8:47 AM  Carl Ferguson  has presented today for surgery, with the diagnosis of hf  The various methods of treatment have been discussed with the patient and family. After consideration of risks, benefits and other options for treatment, the patient has consented to  Procedure(s): RIGHT HEART CATH (N/A) as a surgical intervention .  The patient's history has been reviewed, patient examined, no change in status, stable for surgery.  I have reviewed the patient's chart and labs.  Questions were answered to the patient's satisfaction.     Carl Ferguson [employer]

## 2018-02-05 NOTE — Progress Notes (Signed)
Back from the cath lab by bed awake and alert. Bedrest emphasized.

## 2018-02-05 NOTE — Progress Notes (Signed)
ANTICOAGULATION CONSULT NOTE - Follow Up Consult  Pharmacy Consult for heparin Indication: LVAD  Labs: Recent Labs    02/02/18 0434  02/03/18 0420 02/03/18 0837  02/04/18 0500 02/04/18 1550 02/04/18 2300  HGB 9.5*  --  10.0*  --   --  9.9*  --   --   HCT 28.7*  --  29.7*  --   --  29.1*  --   --   PLT 265  --  302  --   --  388  --   --   LABPROT 18.9*  --   --  16.8*  --  16.7*  --   --   INR 1.61  --   --  1.38  --  1.37  --   --   HEPARINUNFRC  --    < >  --  <0.10*   < > 0.11* 0.13* 0.42  CREATININE 0.77  --  0.79  --   --  1.02  --   --    < > = values in this interval not displayed.    Assessment/Plan:  52yo male therapeutic on heparin after rate changes. Will continue gtt at current rate and confirm stable with am labs.   Vernard Gambles, PharmD, BCPS  02/05/2018,12:32 AM

## 2018-02-06 LAB — CBC
HCT: 23.1 % — ABNORMAL LOW (ref 39.0–52.0)
HCT: 25.5 % — ABNORMAL LOW (ref 39.0–52.0)
Hemoglobin: 7.9 g/dL — ABNORMAL LOW (ref 13.0–17.0)
Hemoglobin: 8.5 g/dL — ABNORMAL LOW (ref 13.0–17.0)
MCH: 31.1 pg (ref 26.0–34.0)
MCH: 30.6 pg (ref 26.0–34.0)
MCHC: 34.2 g/dL (ref 30.0–36.0)
MCHC: 33.3 g/dL (ref 30.0–36.0)
MCV: 90.9 fL (ref 80.0–100.0)
MCV: 91.7 fL (ref 80.0–100.0)
PLATELETS: 329 10*3/uL (ref 150–400)
Platelets: 329 K/uL (ref 150–400)
RBC: 2.54 MIL/uL — ABNORMAL LOW (ref 4.22–5.81)
RBC: 2.78 MIL/uL — AB (ref 4.22–5.81)
RDW: 13.1 % (ref 11.5–15.5)
RDW: 13.2 % (ref 11.5–15.5)
WBC: 13.5 K/uL — ABNORMAL HIGH (ref 4.0–10.5)
WBC: 13.3 10*3/uL — ABNORMAL HIGH (ref 4.0–10.5)
nRBC: 0 % (ref 0.0–0.2)
nRBC: 0 % (ref 0.0–0.2)

## 2018-02-06 LAB — COOXEMETRY PANEL
CARBOXYHEMOGLOBIN: 1.7 % — AB (ref 0.5–1.5)
CARBOXYHEMOGLOBIN: 2 % — AB (ref 0.5–1.5)
METHEMOGLOBIN: 1.8 % — AB (ref 0.0–1.5)
Methemoglobin: 1.9 % — ABNORMAL HIGH (ref 0.0–1.5)
O2 Saturation: 49.9 %
O2 Saturation: 72.6 %
TOTAL HEMOGLOBIN: 8.8 g/dL — AB (ref 12.0–16.0)
Total hemoglobin: 8 g/dL — ABNORMAL LOW (ref 12.0–16.0)

## 2018-02-06 LAB — GLUCOSE, CAPILLARY
GLUCOSE-CAPILLARY: 101 mg/dL — AB (ref 70–99)
Glucose-Capillary: 94 mg/dL (ref 70–99)
Glucose-Capillary: 118 mg/dL — ABNORMAL HIGH (ref 70–99)
Glucose-Capillary: 127 mg/dL — ABNORMAL HIGH (ref 70–99)

## 2018-02-06 LAB — COMPREHENSIVE METABOLIC PANEL
ALBUMIN: 3.1 g/dL — AB (ref 3.5–5.0)
ALT: 15 U/L (ref 0–44)
AST: 14 U/L — AB (ref 15–41)
Alkaline Phosphatase: 64 U/L (ref 38–126)
Anion gap: 10 (ref 5–15)
BILIRUBIN TOTAL: 0.7 mg/dL (ref 0.3–1.2)
BUN: 11 mg/dL (ref 6–20)
CALCIUM: 9 mg/dL (ref 8.9–10.3)
CO2: 27 mmol/L (ref 22–32)
Chloride: 89 mmol/L — ABNORMAL LOW (ref 98–111)
Creatinine, Ser: 0.98 mg/dL (ref 0.61–1.24)
GFR calc Af Amer: >60 mL/min (ref >60)
GFR calc non Af Amer: >60 mL/min (ref >60)
GLUCOSE: 118 mg/dL — AB (ref 70–99)
Potassium: 3.9 mmol/L (ref 3.5–5.1)
Sodium: 126 mmol/L — ABNORMAL LOW (ref 135–145)
TOTAL PROTEIN: 6.7 g/dL (ref 6.5–8.1)

## 2018-02-06 LAB — LACTATE DEHYDROGENASE: LDH: 244 U/L — AB (ref 98–192)

## 2018-02-06 LAB — PROTIME-INR
INR: 1.75
Prothrombin Time: 20.2 seconds — ABNORMAL HIGH (ref 11.4–15.2)

## 2018-02-06 LAB — HEPARIN LEVEL (UNFRACTIONATED): Heparin Unfractionated: 0.36 IU/mL (ref 0.30–0.70)

## 2018-02-06 MED ORDER — METOLAZONE 2.5 MG PO TABS
2.5000 mg | ORAL_TABLET | Freq: Once | ORAL | Status: AC
  Administered 2018-02-06: 2.5 mg via ORAL
  Filled 2018-02-06: qty 1

## 2018-02-06 MED ORDER — TOLVAPTAN 15 MG PO TABS
30.0000 mg | ORAL_TABLET | Freq: Once | ORAL | Status: AC
  Administered 2018-02-06: 30 mg via ORAL
  Filled 2018-02-06: qty 2

## 2018-02-06 MED ORDER — LOSARTAN POTASSIUM 25 MG PO TABS
25.0000 mg | ORAL_TABLET | Freq: Every day | ORAL | Status: DC
  Administered 2018-02-07 – 2018-02-10 (×4): 25 mg via ORAL
  Filled 2018-02-06 (×4): qty 1

## 2018-02-06 MED ORDER — WARFARIN SODIUM 4 MG PO TABS
4.0000 mg | ORAL_TABLET | Freq: Once | ORAL | Status: AC
  Administered 2018-02-06: 4 mg via ORAL
  Filled 2018-02-06: qty 1

## 2018-02-06 MED ORDER — COUMADIN BOOK
Freq: Once | Status: AC
  Administered 2018-02-06: 17:00:00
  Filled 2018-02-06: qty 1

## 2018-02-06 NOTE — Progress Notes (Signed)
ANTICOAGULATION CONSULT NOTE - Follow Up Consult  Pharmacy Consult for Heparin  Indication: LVAD  Allergies  Allergen Reactions  . Norflex [Orphenadrine] Swelling    FACE [ANGIOEDEMA]    Patient Measurements: Height: 5\' 7"  (170.2 cm) Weight: 141 lb (64 kg) IBW/kg (Calculated) : 66.1 Heparin Dosing Weight: 65.9 kg  Vital Signs: Temp: 98.4 F (36.9 C) (10/23 2300) Temp Source: Oral (10/23 2300) BP: 80/69 (10/23 2300) Pulse Rate: 81 (10/23 2300)  Labs: Recent Labs    02/03/18 0420  02/04/18 0500  02/04/18 2300 02/05/18 0349 02/05/18 0350 02/05/18 0846 02/06/18 0230 02/06/18 0243  HGB 10.0*  --  9.9*  --   --  7.9*  --  8.3*  --  8.5*  HCT 29.7*  --  29.1*  --   --  23.1*  --  24.1*  --  25.5*  PLT 302  --  388  --   --  329  --  303  --  329  LABPROT  --    < > 16.7*  --   --  16.0*  --   --   --  20.2*  INR  --    < > 1.37  --   --  1.30  --   --   --  1.75  HEPARINUNFRC  --    < > 0.11*   < > 0.42  --  0.26*  --  0.36  --   CREATININE 0.79  --  1.02  --   --  0.86  --   --   --   --    < > = values in this interval not displayed.    Estimated Creatinine Clearance: 92 mL/min (by C-G formula based on SCr of 0.86 mg/dL).  Assessment: 52 yr old male s/p HM3 on 10/14.   On heparin and Coumadin.  Coumadin held on 10/18 after significant jump in INR 1.3>2.24.  INR remains subtherapeutic at 1.30 s/p warfarin 5 mg x1 yesterday, 4 mg the day prior. Hb 7.9, rechecked 8.3. No signs/symptoms of bleeding, reports coughing up small amounts of blood. No further concerns in the afternoon. LDH relatively stable.    Underwent RHC today, sheath removed at ~1030. Heparin previously near therapeutic at 0.26 on heparin 1950 units/hr. Okay to resume heparin ~6 hours after sheath removed.    10/24 AM update: heparin level therapeutic x 1 after re-start s/p cath, CBC stable  Goal of Therapy:  Heparin level 0.3-0.5 units/ml  Monitor platelets by anticoagulation protocol: Yes   Plan:   -Cont heparin at 1950 units/hr -1000 confirmatory heparin level - Daily heparin level, PT/INR and CBC with am labs.  Abran Duke, PharmD, BCPS Clinical Pharmacist Phone: [phone]

## 2018-02-06 NOTE — Progress Notes (Signed)
Anterior chest  Surgical site painted with betadine no drainage  noted.. Sutures on abd. x3  post chest tube site removed and tolerated well. Cleansed with betadine.

## 2018-02-06 NOTE — Progress Notes (Addendum)
Patient ID: Carl Ferguson, male   DOB: [birth year], 52 y.o.   MRN: [id number]     Advanced Heart Failure Rounding Note  PCP-Cardiologist: Garwin Brothers, MD   Subjective:    Events 10/11 RHC - Stable on milrinone 0.25 10/14 HM3 implantation; 2 units FFP. CTs and pressors in place out of OR.  10/14 pm. Speed reduced to 5600 after large UOP and many PI events.  10/15 spiked fever up to 103.5. Started on vanc/unasyn. Cxs sent. 02/05/18 underwent ramp with RHC. Speed to 5700.  Coox 50% this am but drawn at 0235.  CVP ~11-12   WBC 13.3 -> 14.8 -> 13.5 -> 13.3. Tmax 99.6. Not on ABX  Hgb 9.9 -> 7.9 -> 8.5.  INR 1.30 -> 1.75. On coumadin and heparin drip. May be able to stop heparin gtt this pm.   Negative 3.6 L but weight up 3 lbs. Suspect inaccurate.   Feeling good this am. Had mild lightheadedness with standing yesterday, but overall OK today. Denies SOB.  LVAD Interrogation HM 3: Speed: 5700 Flow: 5.3 PI: 2.0 Power: 4.0. 1 PI event  RHC 02/05/18 at 5600 rpm RA 14 RV 36/14 PA 40/14, mean 25 PCWP mean 13 Oxygen saturations: PA 57% AO 97% Cardiac Output (Fick) 5.4  Cardiac Index (Fick) 3.1 PVR 2.2 WU Cardiac Output (Thermo) 5.8 Cardiac Index (Thermo) 3.3  Objective:   Weight Range: 65.6 kg Body mass index is 22.65 kg/m.   Vital Signs:   Temp:  [97.8 F (36.6 C)-98.8 F (37.1 C)] 97.8 F (36.6 C) (10/24 0736) Pulse Rate:  [0-145] 89 (10/24 0736) Resp:  [0-23] 23 (10/24 0736) BP: (60-116)/(51-91) 93/78 (10/24 0736) SpO2:  [0 %-100 %] 92 % (10/24 0736) Weight:  [65.6 kg] 65.6 kg (10/24 0500) Last BM Date: 02/02/18  Weight change: Filed Weights   02/04/18 0500 02/05/18 0500 02/06/18 0500  Weight: 65.2 kg 64 kg 65.6 kg   Intake/Output:   Intake/Output Summary (Last 24 hours) at 02/06/2018 0917 Last data filed at 02/06/2018 0800 Gross per 24 hour  Intake 578 ml  Output 4200 ml  Net -3622 ml    Physical Exam    General: Well appearing this am. NAD.    HEENT: Normal. Neck: Supple, JVP ~11-12 cm. Carotids OK.  Cardiac:  Mechanical heart sounds with LVAD hum present.  Lungs:  CTAB, normal effort.  Abdomen:  NT, ND, no HSM. No bruits or masses. +BS  LVAD exit site: Dressing dry and intact. No erythema or drainage. Stabilization device present and accurately applied. Driveline dressing changed daily per sterile technique. Extremities:  Warm and dry. No cyanosis, clubbing, rash, or edema.  Neuro:  Alert & oriented x 3. Cranial nerves grossly intact. Moves all 4 extremities w/o difficulty. Affect pleasant     Telemetry   V paced 90, personally reviewed.   Labs    CBC Recent Labs    02/05/18 0846 02/06/18 0243  WBC 12.5* 13.3*  HGB 8.3* 8.5*  HCT 24.1* 25.5*  MCV 90.3 91.7  PLT 303 329   Basic Metabolic Panel Recent Labs    40/98/11 0349 02/05/18 0656 02/06/18 0243  NA 126*  --  126*  K 3.7  --  3.9  CL 85*  --  89*  CO2 30  --  27  GLUCOSE 103*  --  118*  BUN 14  --  11  CREATININE 0.86  --  0.98  CALCIUM 9.1  --  9.0  MG  --  1.9  --    Liver Function Tests Recent Labs    02/05/18 0349 02/06/18 0243  AST 22 14*  ALT 16 15  ALKPHOS 61 64  BILITOT 0.7 0.7  PROT 6.7 6.7  ALBUMIN 3.0* 3.1*   No results for input(s): LIPASE, AMYLASE in the last 72 hours. Cardiac Enzymes No results for input(s): CKTOTAL, CKMB, CKMBINDEX, TROPONINI in the last 72 hours.  BNP: BNP (last 3 results) Recent Labs    12/30/17 0858 01/28/18 0542 02/03/18 0045  BNP 4,087.0* 235.0* 549.1*    ProBNP (last 3 results) No results for input(s): PROBNP in the last 8760 hours.   D-Dimer No results for input(s): DDIMER in the last 72 hours. Hemoglobin A1C No results for input(s): HGBA1C in the last 72 hours. Fasting Lipid Panel No results for input(s): CHOL, HDL, LDLCALC, TRIG, CHOLHDL, LDLDIRECT in the last 72 hours. Thyroid Function Tests No results for input(s): TSH, T4TOTAL, T3FREE, THYROIDAB in the last 72 hours.  Invalid  input(s): FREET3  Other results:   Imaging    No results found.   Medications:     Scheduled Medications: . amiodarone  200 mg Oral BID  . aspirin EC  81 mg Oral Daily  . bisacodyl  10 mg Oral Daily   Or  . bisacodyl  10 mg Rectal Daily  . Chlorhexidine Gluconate Cloth  6 each Topical Daily  . docusate sodium  200 mg Oral Daily  . feeding supplement (ENSURE ENLIVE)  237 mL Oral TID BM  . furosemide  80 mg Intravenous BID  . gabapentin  400 mg Oral BID  . insulin aspart  0-24 Units Subcutaneous TID AC & HS  . losartan  25 mg Oral BID  . mouth rinse  15 mL Mouth Rinse BID  . pantoprazole  40 mg Oral Daily  . potassium chloride  40 mEq Oral BID  . sildenafil  20 mg Oral TID  . sodium chloride flush  10-40 mL Intracatheter Q12H  . sodium chloride flush  3 mL Intravenous Q12H  . sodium chloride flush  3 mL Intravenous Q12H  . Warfarin - Pharmacist Dosing Inpatient   Does not apply q1800    Infusions: . sodium chloride Stopped (02/03/18 1007)  . sodium chloride    . ferumoxytol 510 mg (02/05/18 1618)    PRN Medications: sodium chloride, sodium chloride, acetaminophen, hydrALAZINE, lip balm, ondansetron (ZOFRAN) IV, oxyCODONE, sodium chloride flush, traMADol  Assessment/Plan   1. Chronic Systolic CHF: S/p HM3 01/27/18.  Nonischemic cardiomyopathy, diagnosed in 2011, initially followed at [place]/[facility].  LHC (3/19) with no significant CAD.  CPX (3/19) submaximal, but suggestive of mild to moderate HF limitation. HIV negative 3/19, history of drug/ETOH abuse in the past, no drugs now and had cut back ETOH considerably.  Echo 9/19 showed EF 20% with severe LV dilation, severe MR.  NYHA class IV symptoms. He has a wide LBBB.  RHC was done in 9/19 showing low output, CI 1.4.  He was admitted to start milrinone, unable to wean off milrinone and remained on 0.25 mcg/kg/min at home.  Medtronic ICD was placed, he has a His bundle lead but EP was unable to get an LV lead in place => His  bundle pacing does not narrow his QRS and did not help [place] wean him off milrinone.  He did well on milrinone 0.25 mcg/kg/min, NYHA class II.  RHC 10/11 shows filling pressures and cardiac output to be fairly well optimized. HM3 placed 10/14.  Now  off milrinone post-op.  - Speed increased to 5700 02/05/18 with echo + RHC LVAD optimization.  - Coox 50% but drawn at 2 am. RHC stable 02/05/18.  - Hgb 8.5 this am.  - Volume status remains elevated with CVP 12. Continue IV lasix 80 mg BID today. Repeat metolazone and tolvaptan. - Continue losartan 25 mg BID. If remains lightheaded, may have to cut back.  - INR 1.75. On warfarin + heparin drip until INR > 1.8.  Now on ASA 81. - Continue walking.  - LDH 244 - Sildenafil 20 mg TID added for evidence of RV failure with PAPI 1.85. 2. ID: Treated with Augmentin for dental infection pre-op.  POD#1 had fever of 103.5. Started on Vanc/Unasyn.  Afebrile now. WBCs 13.3. Tmax 98.8  - Cultures NGTD and off antibiotics.  He has had episodes of loose stool. - C diff negative.  3. ETOH Abuse: No longer drinking. No change.  4. Tobacco abuse: He has quit smoking since admission in 9/19.  - Continue to encourage complete cessation. No change.  5. Mitral regurgitation: Severe functional MR.  With LV dilated > 7 cm, suspect Mitraclip would be unlikely to help him appreciably.  Decreased MR s/p LVAD placement.  No change.  6. VT: Patient had episode of VT requiring ATP prior to admission. Had VT for around 5 minutes 01/29/18.   - Continue po amiodarone.  No change.  7. Hypokalemia:  - K 3.9 this am. Follow.  8. Hyponatremia:  - Na 126 this am. Repeat Tolvaptan.  Graciella Freer, PA-C  02/06/2018 9:17 AM   VAD Team Pager [phone] (7am - 7am) ++VAD ISSUES ONLY++   Advanced Heart Failure Team Pager [phone] (M-F; 7a - 4p)  Please contact [facility] for night-coverage after hours (4p -7a ) and weekends on [email or web address]  Patient seen with PA, agree with  the above note.   He is doing well today.  Good diuresis yesterday with IV Lasix, tolvaptan, and metolazone.  JVP remains 11-12.   - Will give 1 more day of Lasix 80 mg IV bid + metolazone + tolvaptan (Na 126).  Transition to po tomorrow.    INR 1.75 today, can stop heparin gtt.   He had evidence for moderate RV dysfunction by echo and RHC.  I added sildenafil 20 mg tid.   MAP down to 70s, can decrease losartan down to 25 mg once daily.   Social work helping with post-discharge plan.  He could leave hospital potentially on Saturday from my standpoint.   Marca Ancona 02/06/2018 10:11 AM

## 2018-02-06 NOTE — Progress Notes (Signed)
CSW met with patient at bedside. Patient states he is doing well and pleased with his recovery. Patient states he and his caregiver continue to pursue options for housing and state will have a temporary option if needed. CSW provided supportive intervention and discussed community resources as needed. CSW will be available to patient throughout implant hospitalization and through the VAD clinic post op. Raquel Sarna, [place], [place]

## 2018-02-06 NOTE — Progress Notes (Signed)
1 Day Post-Op Procedure(s) (LRB): RIGHT HEART CATH (N/A) Subjective: Patient feels well after pump speed increased yesterday to 5700 Heparin was stopped earlier today for INR 1.8.  We will remove epicardial pacing leads and chest tube sutures today Surgical incisions clean and dry Objective: Vital signs in last 24 hours: Temp:  [97.8 F (36.6 C)-98.8 F (37.1 C)] 98.7 F (37.1 C) (10/24 1122) Pulse Rate:  [70-100] 70 (10/24 1122) Cardiac Rhythm: Ventricular paced (10/24 1200) Resp:  [19-23] 19 (10/24 1122) BP: (60-94)/(51-79) 79/55 (10/24 1122) SpO2:  [92 %-98 %] 93 % (10/24 1122) Weight:  [65.6 kg] 65.6 kg (10/24 0500)  Hemodynamic parameters for last 24 hours: CVP:  [14 mmHg-16 mmHg] 15 mmHg  Intake/Output from previous day: 10/23 0701 - 10/24 0700 In: 578.5 [P.O.:430; I.V.:148.5] Out: 4200 [Urine:4200] Intake/Output this shift: Total I/O In: 319.5 [P.O.:300; I.V.:19.5] Out: 1150 [Urine:1150]  Alert and comfortable Lungs clear Normal VAD hum Abdomen soft Minimal pedal edema  Lab Results: Recent Labs    02/05/18 0846 02/06/18 0243  WBC 12.5* 13.3*  HGB 8.3* 8.5*  HCT 24.1* 25.5*  PLT 303 329   BMET:  Recent Labs    02/05/18 0349 02/06/18 0243  NA 126* 126*  K 3.7 3.9  CL 85* 89*  CO2 30 27  GLUCOSE 103* 118*  BUN 14 11  CREATININE 0.86 0.98  CALCIUM 9.1 9.0    PT/INR:  Recent Labs    02/06/18 0243  LABPROT 20.2*  INR 1.75   ABG    Component Value Date/Time   PHART 7.535 (H) 01/30/2018 0530   HCO3 31.0 (H) 02/05/2018 0943   HCO3 29.1 (H) 02/05/2018 0943   TCO2 32 02/05/2018 0943   TCO2 30 02/05/2018 0943   ACIDBASEDEF 2.0 01/28/2018 1738   O2SAT 72.6 02/06/2018 1023   CBG (last 3)  Recent Labs    02/05/18 1716 02/05/18 2126 02/06/18 0820  GLUCAP 117* 148* 94    Assessment/Plan: S/P Procedure(s) (LRB): RIGHT HEART CATH (N/A) Patient should be ready for discharge soon Remove epicardial pacing wires and chest tube sutures  today Activity restrictions at the time of discharge were discussed with patient-sternal precautions  LOS: 18 days    Carl Ferguson 02/06/2018

## 2018-02-06 NOTE — Progress Notes (Signed)
2 RN attempted to pull out EP wire but unsuccessful. Dr. Maren Beach  made aware by RN , claimed to do it tom. Patient made aware.

## 2018-02-06 NOTE — Care Management Note (Signed)
Case Management Note Previous note created by Midge Minium  Patient Details  Name: Carl Ferguson MRN: [id number] Date of Birth: 1965/08/17  Subjective/Objective:   Pt admitted for dental extraction - pt is on home milrninone                 Action/Plan:  PTA from home on IV milrinone with [facility] - has HHRN.  Pt readmit and has recently utilized [facility]   Expected Discharge Date:                 Expected Discharge Plan:  [place]  In-House Referral:  NA  Discharge planning Services  CM Consult  Post Acute Care Choice:  Home Health, Durable Medical Equipment Choice offered to:  Patient  DME Arranged:  Walker rolling with seat, Bedside commode DME Agency:  [place]:  RN, Disease Management, PT [place] Agency:  [facility]  Status of Service:  In process, will continue to follow  If discussed at Long Length of Stay Meetings, dates discussed:    Additional Comments: 02/06/2018 HF team will fund discharge medications.  CM will continue to follow for discharge needs  01/31/18 @ 1539-Natalie Gay RNCM-CM met with patient to discuss transitional needs and PT recommendations for HHPT/DME. Patient was open to [facility] PTA, and would like to resume post discharge. Patient will need orders for [facility] services, rollator, 3n1 BSC with F2F. [place] referral will be given to [facility] liaison; AVS updated. CM will continue to follow along with VAD team for transitional needs.   01/28/2018 @ 1602-Natalie Gay RNCM-CM consult acknowledged for VAD patient. S/p LVAD placement on 10/14 with CM to follow to assist with dispositional needs.   01/21/2018 Plan is for pt to have Swan placed 01/24/18. Tentative LVAD planned for early next week  [place], BSN, NCM-BC, ACM-RN [phone] 02/06/2018, 11:20 AM

## 2018-02-06 NOTE — Progress Notes (Signed)
LVAD Coordinator Rounding Note:  Admitted 01/18/18 due to fever and chills. Started on broad spectrum antibiotics. Teeth extractions done by Dr. Kristin Bruins 01/20/18. RHC done 01/24/18.   HM III LVAD implanted on 01/27/18 by Dr. Donata Clay under Destination Therapy criteria.  Patient laying in bed watching TV. He states "I do not think I am ready to go home yet." Explained that we would be doing discharge teaching today, and would prepare him for going home.   Vital signs: Temp: 98.7 HR: 87 AV-paced Cuff BP:  93/78 Doppler: 80 O2 Sat: 93% on RA Wt: 137.5>158.7>149.9>147.2>147.9>144.9>143.7>140.8>144.3 lbs   LVAD interrogation reveals:  Speed: 5700 Flow: 5.3 Power: 4.3w PI: 1.8 Alarms: none Events: 1 PI Hematocrit: 29  Fixed speed: 5600 Low speed limit: 5300   Drive Line:  Left lower abdominal dressing clean, dry, intact. Will change this afternoon.    Labs:  LDH trend: 282>324>263>262>472>263>239>244   INR trend: 1.26>1.33>1.31>1.30>2.0>2.71>1.37>1.30>1.75  WBC trend: 8.6>15.6>16.3>14.5>10.4>11.1>14.8>12.5>13.3  Anticoagulation Plan: -INR Goal:  2.0 - 2.5 -ASA Dose: ASA 324mg  daily until INR therapeutic  Blood Products:  -Intraop: 4 FFP; 450 cell saver   - Post op:  01/27/18>>1 PRBC  Device: - Medtronic ICD - Pacing: DDD 60 - Therapies: turned on 02/04/18  Drips: Milrinone 0.72mcg/kg/min--off 01/30/18 Amiodarone 30mg /hr--restarted 01/30/18--transitioned to PO 01/31/18 Lasix 8mg /hr--off Heparin--started 01/30/18--off 01/31/18  Arrythmias: 01/29/18- VT for roughly 5 minutes.  Amio bolus given.  Respiratory: extubated 01/28/18   Infection: Blood/urine/sputum cultures ordered due to fever 101.3.  - 01/28/18 BC's>> NTD -  01/28/18 urine cultures>>NTD  Adverse Events on VAD: -   VAD Education:  1. Plan to do discharge teaching with caregiver Marg today at 2pm.    Plan/Recommendations:  1. Daily dressing changes per VAD RN, nurse champion, or trained  caregiver.  2. Call VAD pager with any equipment or drive line questions, or needs.   [name] RN VAD Coordinator  Office: [phone]  24/7 Pager: [phone]

## 2018-02-06 NOTE — Progress Notes (Signed)
ANTICOAGULATION CONSULT NOTE - Follow Up Consult  Pharmacy Consult for Coumadin Indication: LVAD  Allergies  Allergen Reactions  . Norflex [Orphenadrine] Swelling    FACE [ANGIOEDEMA]    Patient Measurements: Height: 5\' 7"  (170.2 cm) Weight: 144 lb 10 oz (65.6 kg) IBW/kg (Calculated) : 66.1 Heparin Dosing Weight: 65.9 kg  Vital Signs: Temp: 97.8 F (36.6 C) (10/24 0736) Temp Source: Oral (10/24 0736) BP: 93/78 (10/24 0736) Pulse Rate: 89 (10/24 0736)  Labs: Recent Labs    02/04/18 0500  02/04/18 2300 02/05/18 0349 02/05/18 0350 02/05/18 0846 02/06/18 0230 02/06/18 0243  HGB 9.9*  --   --  7.9*  --  8.3*  --  8.5*  HCT 29.1*  --   --  23.1*  --  24.1*  --  25.5*  PLT 388  --   --  329  --  303  --  329  LABPROT 16.7*  --   --  16.0*  --   --   --  20.2*  INR 1.37  --   --  1.30  --   --   --  1.75  HEPARINUNFRC 0.11*   < > 0.42  --  0.26*  --  0.36  --   CREATININE 1.02  --   --  0.86  --   --   --  0.98   < > = values in this interval not displayed.    Estimated Creatinine Clearance: 82.7 mL/min (by C-G formula based on SCr of 0.98 mg/dL).  Assessment: 52 yr old male s/p HM3 on 10/14. Coumadin held on 10/18 after significant jump in INR 1.3>2.24.  INR from 1.30 > 1.75, s/p warfarin 7.5 mg x1 yesterday, 5 mg the day prior. Heparin gtt has been stopped. Hb 8.5. No signs/symptoms of bleeding. LDH relatively stable.    Goal of Therapy:  INR 2-2.5 Monitor platelets by anticoagulation protocol: Yes   Plan:  - Warfarin 4 mg x1  - Daily heparin level, PT/INR and CBC with am labs.   Marcelino Freestone, PharmD PGY2 Cardiology Pharmacy Resident Phone [phone] Please check AMION for all Pharmacist numbers by unit 02/06/2018 10:26 AM7.

## 2018-02-07 LAB — COOXEMETRY PANEL
CARBOXYHEMOGLOBIN: 1.7 % — AB (ref 0.5–1.5)
Carboxyhemoglobin: 1.6 % — ABNORMAL HIGH (ref 0.5–1.5)
METHEMOGLOBIN: 1.7 % — AB (ref 0.0–1.5)
METHEMOGLOBIN: 1.3 % (ref 0.0–1.5)
O2 SAT: 42.9 %
O2 SAT: 45.9 %
TOTAL HEMOGLOBIN: 8.5 g/dL — AB (ref 12.0–16.0)
TOTAL HEMOGLOBIN: 7.7 g/dL — AB (ref 12.0–16.0)

## 2018-02-07 LAB — COMPREHENSIVE METABOLIC PANEL
ALT: 13 U/L (ref 0–44)
ANION GAP: 10 (ref 5–15)
AST: 19 U/L (ref 15–41)
Albumin: 3 g/dL — ABNORMAL LOW (ref 3.5–5.0)
Alkaline Phosphatase: 64 U/L (ref 38–126)
BUN: 9 mg/dL (ref 6–20)
CHLORIDE: 91 mmol/L — AB (ref 98–111)
CO2: 29 mmol/L (ref 22–32)
CREATININE: 0.83 mg/dL (ref 0.61–1.24)
Calcium: 9 mg/dL (ref 8.9–10.3)
Glucose, Bld: 127 mg/dL — ABNORMAL HIGH (ref 70–99)
POTASSIUM: 3.9 mmol/L (ref 3.5–5.1)
Sodium: 130 mmol/L — ABNORMAL LOW (ref 135–145)
Total Bilirubin: 0.8 mg/dL (ref 0.3–1.2)
Total Protein: 6.2 g/dL — ABNORMAL LOW (ref 6.5–8.1)

## 2018-02-07 LAB — CBC
HCT: 24.5 % — ABNORMAL LOW (ref 39.0–52.0)
Hemoglobin: 8.2 g/dL — ABNORMAL LOW (ref 13.0–17.0)
MCH: 30.9 pg (ref 26.0–34.0)
MCHC: 33.5 g/dL (ref 30.0–36.0)
MCV: 92.5 fL (ref 80.0–100.0)
PLATELETS: 312 10*3/uL (ref 150–400)
RBC: 2.65 MIL/uL — ABNORMAL LOW (ref 4.22–5.81)
RDW: 13.3 % (ref 11.5–15.5)
WBC: 12.2 10*3/uL — AB (ref 4.0–10.5)
nRBC: 0 % (ref 0.0–0.2)

## 2018-02-07 LAB — PROTIME-INR
INR: 2.41
Prothrombin Time: 25.9 seconds — ABNORMAL HIGH (ref 11.4–15.2)

## 2018-02-07 LAB — GLUCOSE, CAPILLARY
GLUCOSE-CAPILLARY: 99 mg/dL (ref 70–99)
Glucose-Capillary: 90 mg/dL (ref 70–99)
Glucose-Capillary: 112 mg/dL — ABNORMAL HIGH (ref 70–99)
Glucose-Capillary: 106 mg/dL — ABNORMAL HIGH (ref 70–99)

## 2018-02-07 LAB — LACTATE DEHYDROGENASE: LDH: 243 U/L — ABNORMAL HIGH (ref 98–192)

## 2018-02-07 MED ORDER — WARFARIN SODIUM 1 MG PO TABS
1.0000 mg | ORAL_TABLET | Freq: Once | ORAL | Status: AC
  Administered 2018-02-07: 1 mg via ORAL
  Filled 2018-02-07: qty 1

## 2018-02-07 MED ORDER — MORPHINE SULFATE (PF) 2 MG/ML IV SOLN
INTRAVENOUS | Status: AC
  Administered 2018-02-07: 2 mg via INTRAVENOUS
  Filled 2018-02-07: qty 1

## 2018-02-07 MED ORDER — TORSEMIDE 20 MG PO TABS
80.0000 mg | ORAL_TABLET | Freq: Every day | ORAL | Status: DC
  Administered 2018-02-07 – 2018-02-09 (×3): 80 mg via ORAL
  Filled 2018-02-07 (×3): qty 4

## 2018-02-07 MED ORDER — ENSURE ENLIVE PO LIQD
237.0000 mL | Freq: Two times a day (BID) | ORAL | Status: DC
  Administered 2018-02-07 – 2018-02-10 (×6): 237 mL via ORAL

## 2018-02-07 MED ORDER — MORPHINE SULFATE (PF) 2 MG/ML IV SOLN: 2.0000 mg | Freq: Once | INTRAVENOUS | Status: AC

## 2018-02-07 MED ORDER — SPIRONOLACTONE 12.5 MG HALF TABLET
12.5000 mg | ORAL_TABLET | Freq: Every day | ORAL | Status: DC
  Administered 2018-02-07 – 2018-02-10 (×4): 12.5 mg via ORAL
  Filled 2018-02-07 (×4): qty 1

## 2018-02-07 MED ORDER — POTASSIUM CHLORIDE CRYS ER 20 MEQ PO TBCR
40.0000 meq | EXTENDED_RELEASE_TABLET | Freq: Every day | ORAL | Status: DC
  Administered 2018-02-08 – 2018-02-10 (×3): 40 meq via ORAL
  Filled 2018-02-07 (×3): qty 2

## 2018-02-07 MED FILL — Heparin Sod (Porcine)-NaCl IV Soln 1000 Unit/500ML-0.9%: INTRAVENOUS | Qty: 500 | Status: AC

## 2018-02-07 NOTE — Progress Notes (Signed)
VAD Education Note:  Patient's caregiver Claris Che supervised, and coached, during dressing change.    Drive Line:  Existing VAD dressing removed and site care performed using sterile technique. Drive line exit site cleaned with Chlora prep applicators x 2, allowed to dry, and gauze dressing with silver strip re-applied. 2 sutures intact. Exit site healing and unincorporated, the velour is fully implanted at exit site. No redness, tenderness, drainage, foul odor or rash noted. Drive line anchor re-applied.   Educated on importance of having the patient wear at least 1 anchor at all times to prevent drive line trauma. Instructed that the anchor does not have to be changed with every dressing change, but if the adhesive is pulling away from skin, and is not secure, the anchor needs to be changed.   Plan to supervise dressing change tomorrow afternoon, Friday 02/07/18.  [name] RN VAD Coordinator  Office: [phone]  24/7 Pager: [phone]

## 2018-02-07 NOTE — Progress Notes (Signed)
CARDIAC REHAB PHASE I   PRE:  Rate/Rhythm: 91 pacing    BP: sitting 92    SaO2: 94 RA  MODE:  Ambulation: 1000 ft   POST:  Rate/Rhythm: 104 pacing    BP: sitting 88     SaO2: 96 RA   Pt reluctant to walk as he has been up and down in the room throughout the day. He is reluctant to go through the hassle of going to batteries. Encouraged him to get "long distance" walks. He was able to transfer to batteries with ease and ambulate 1000 ft pushing IV pole. No c/o. Likes to be independent. Ed completed. Sts he does not need low sodium diet, "I know all that, got all the papers". Encouraged continued walking x3 a day (goal). We will sign off.  [id number]  Harriet Masson CES, ACSM 02/07/2018 10:47 AM

## 2018-02-07 NOTE — Progress Notes (Signed)
CSW met with pt at bedside to check in.  Pt is feeling pretty good and is hopeful to discharge home by Monday.  No questions or concerns at this time and is feeling prepared to be independent.  CSW will continue to follow pt in clinic  Jorge Ny, [place] Worker [facility] [phone]

## 2018-02-07 NOTE — Progress Notes (Signed)
VAD Discharge Teaching Note:  Discharge VAD teaching completed with Cathlean Marseilles and his caregiver support team:             Margaret-girlfriend  The home inspection checklist has been reviewed but pt is unsure where he will be residing post discharge. Initially we identified no unsafe conditions. Specifically the family again reports that there are at least two dedicated grounded, 3-prong outlets with clearly labeled circuit breaker has been established in the bedroom for Charity fundraiser and MPU, running water is in the home as well as a reliable telephone. Pt will more than likely be discharge to his girlfriends sisters home which meets our checklist.  A daily flow sheet with patient  weight, temperature,  flow, speed, power, and PI, along with daily self checks on system controller have been performed by patient and caregiver(s) during hospitalization and will also be done daily at home.   Both patient and caregivers have been trained on the following:   1. HeartMate 3 LVAD overview of system operations  2. Overview of major lifestyle accommodations and cautions   3. Overview of system components (features and functions) 4. Changing power sources 5. Overview of alerts and alarms 6. How to identify and manage an emergency including when pump is running and when pump has stopped      7. Changing system controller 8. Maintain emergency contact list and medications  The patient and patent's caregivers have successfully demonstrated:   1. Changing power source (from batteries to MPU/PM, MPU/PM to batteries replacing batteries) 2. Perform system controller self test  3. Check and charge batteries  4. Identify routine maintanence for MPU/UBC/batteries.  5. Change system controller. 6. Paged VAD pager and programmed number in phones.  Exit Site Care:  The caregiver(s) has been trained on percutaneous lead exit site care and dressing changes and have performed sterile dressing  changes during patient's hospitalization under nursing supervision. The importance of lead immobilization has been stressed to patient and caregiver(s) using the attachment device. The caregiver has successfully demonstrated the following: 1. Accurate sterile technique 2. Cleaning of the site per hospital protocol 2. Applying the dressing correctly  3. Immobilizing drive line appropriately   The following routine activities and maintenance have been reviewed with patient and caregiver(s) and both verbalize understanding:  1. Stressed importance of never disconnecting power from both controller power leads at the same time, and never disconnecting both batteries at the same time.  2. Plug the Mobile Power Unit (MPU) and the universal Charity fundraiser (UBC) into properly grounded (3 prong) outlets dedicated to MPU/UBC use. Do NOT use adapter (cheater plug) for ungrounded outlets or multiple portable socket outlets (power strips). 3. Do not connect the MPU or UBC to an outlet controlled by wall switch or the device may not work 4. The MPU AA batteries that provide power to the speaker and indicator lights should be changed every 6 months.  6. Transfer from MPU to batteries during [facility] power failure. The system controller will provide 49mof power to run the pump at set speed.  7. Keep a backup system controller, charged batteries, battery clips, and flashlight near you during sleep in case of electrical power outage 8. Clean battery, battery clip, and universal battery charger contacts weekly 9. Visually inspect percutaneous lead daily - report any tearing, splitting or damage.  10. Check cables and connectors when changing power source  11. Rotate batteries weekly; keep all eight batteries charged. Specific emphasis to remember the  emergency back up batteries.  12. Always have backup system controller, battery clips, fully charged batteries, and spare fully charged batteries when traveling. Always  notify your VAD Team of travel >3 hours distance away.  13. Re-calibrate batteries every 70 uses; recharge back up system controller every 6 months, monitor battery life of 36 months or 360 uses; replace batteries at end of battery life   Identified the following changes in activities of daily living with pump:  1. No driving for at least six weeks and then only if doctor gives permission to do so 2. No tub baths while pump implanted, and shower only if doctor gives permission with the manufacturer shower equipment.  3. No swimming or submersion in water while implanted with pump 4. Keep all VAD equipment away from water or moisture 5. Keep all VAD connections clean and dry 6. No contact sports or engage in jumping activities 7. Avoid strong static electricity (touching TV/computer screens, vacuuming) 8. Never have an MRI while implanted with the pump 9. Never leave or store batteries in extremely hot or cold places (such as   trunk of your car), or the battery life will be shortened 10. Call the doctor or hospital contact person if any change in how the pump sounds, feels, or works 11. Plan to sleep only when connected to the MPU. 12. Keep a backup system controller, charged batteries, battery clips, and flashlight near you during sleep in case of electrical power outage 13. Do not sleep on your stomach and avoid laying on driveline exit site, especially while it is healing.  14. Talk with doctor/coordinator before any long distance travel plans 15. Patient will need antibiotics prior to any dental procedure; instructed to contact VAD coordinator before any dental procedures (including routine cleaning) 16. Paitent will maintain appointments with PCP.    Discharge binder given to patient and include the following:  1. Discharge instructions - [place] VAD Patient Education Binder 2. List of emergency contacts 3. Wallet card 4. HM 3 VAD Luggage tags 5. Guide to Percutaneous  Lead Care and Equipment Maintenance 6. HM 3 Alarms for Patients and their Caregivers 7. HM 3 Patient Handbook 8. HM 3 Information and Emergency Assistance Guide 9. HM 3 MPU Alarms, Care & Maintenance 11. Daily diary sheets 12. Care of the Percutaneous Lead  Discharge equipment includes:  1. Two system controllers 2. One MPU with 50' patient cable 3. One universal Charity fundraiser (UBC) 4. Eight fully charged 14V Li-Ion batteries      5. Four battery clips 6. One travel case 7. Medium Holster Vest 8. Wearable accessory kit 9. Shower bags (2) 10. 20 dressings and 5 anchors  Warfarin / ASA Education & Management:  Discussed frequency and importance of INR checks and taking coumadin/ASA as prescribed by the doctor; emphasized importance of maintaining INR goal to prevent clotting and/or bleeding issues with pump. He has never been on warfarin in the past and will meet more with coordinators and pharm-D on outpatient basis. He has received inpatient coumadin education and has been educated on diet and importance to inform us about medication changes made outside VAD clinic. Reinforced that he should ONLY take instructions regarding his coumadin from our VAD team and we need to be involved in any elective procedures that require warfarin hold.   Able to answer questions and asked good questions pertaining to warfarin and diet/lifestyle changes necessary to be successful and safe.  Patient will have INR managed by the VAD clinic  pharmacist; current INR goal is 2.0 - 2.5.    Controller Change Outs: 1. Patient should have a caregiver present during system controller exchange or call 911 and VAD pager before attempting to change controller if alone. 2. As part of the weekly safety checklist, the patient and caregiver should review and understand the steps and instructions involved with replacing the system controller.  3. As part of monthly safety checklist, the patient and caregiver should  review and understand the system controller alarms and how to resolve them.  4. Updated copy of HM 3 Guide to Replacing the [place] with the [place] for Patients and Their Caregiver(s) was given and reviewed.  5. HM 3 Home Flowsheets given that include the weekly and monthly checks as noted above.  Post-Education Competency Test: The patient has completed a proficiency test for the HM 3 and all questions have been answered. The pt and family have been instructed to call if any questions, problems, or concerns arise. Pt and caregiver successfully paged VAD coordinator using VAD pager emergency number and have been instructed to use this number only for emergencies. Caregiver(s) asked appropriate questions, had good interaction with VAD coordinator, and verbalized understanding of above instructions.   Pt is unclear at this time where he will be discharged.  Follow up appointment for VAD clinic will be made on Monday prior to discharge.   Pt is clear to discharge from VAD team pending medications from the outpatient pharmacy using the heart failure fund as the pt does not have insurance. Pt will need to d/c to a safe, clean environment. Education is complete and Joycelyn Schmid is clear to perform dressing changes.   Tanda Rockers, RN VAD Coordinator  Office: [phone] 24/7 VAD Pager: [phone]

## 2018-02-07 NOTE — Discharge Instructions (Signed)
MOUTH CARE AFTER SURGERY ° °FACTS: °· Ice used in ice bag helps keep the swelling down, and can help lessen the pain. °· It is easier to treat pain BEFORE it happens. °· Spitting disturbs the clot and may cause bleeding to start again, or to get worse. °· Smoking delays healing and can cause complications. °· Sharing prescriptions can be dangerous.  Do not take medications not recently prescribed for you. °· Antibiotics may stop birth control pills from working.  Use other means of birth control while on antibiotics. °· Warm salt water rinses after the first 24 hours will help lessen the swelling:  Use 1/2 teaspoonful of table salt per oz.of water. ° °DO NOT: °· Do not spit.  Do not drink through a straw. °· Strongly advised not to smoke, dip snuff or chew tobacco at least for 3 days. °· Do not eat sharp or crunchy foods.  Avoid the area of surgery when chewing. °· Do not stop your antibiotics before your instructions say to do so. °· Do not eat hot foods until bleeding has stopped.  If you need to, let your food cool down to room temperature. ° °EXPECT: °· Some swelling, especially first 2-3 days. °· Soreness or discomfort in varying degrees.  Follow your dentist's instructions about how to handle pain before it starts. °· Pinkish saliva or light blood in saliva, or on your pillow in the morning.  This can last around 24 hours. °· Bruising inside or outside the mouth.  This may not show up until 2-3 days after surgery.  Don't worry, it will go away in time. °· Pieces of "bone" may work themselves loose.  It's OK.  If they bother you, let us know. ° °WHAT TO DO IMMEDIATELY AFTER SURGERY: °· Bite on the gauze with steady pressure for 1-2 hours.  Don't chew on the gauze. °· Do not lie down flat.  Raise your head support especially for the first 24 hours. °· Apply ice to your face on the side of the surgery.  You may apply it 20 minutes on and a few minutes off.  Ice for 8-12 hours.  You may use ice up to 24  hours. °· Before the numbness wears off, take a pain pill as instructed. °· Prescription pain medication is not always required. ° °SWELLING: °· Expect swelling for the first couple of days.  It should get better after that. °· If swelling increases 3 days or so after surgery; let us know as soon as possible. ° °FEVER: °· Take Tylenol every 4 hours if needed to lower your temperature, especially if it is at 100F or higher. °· Drink lots of fluids. °· If the fever does not go away, let us know. ° °BREATHING TROUBLE: °· Any unusual difficulty breathing means you have to have someone bring you to the emergency room ASAP ° °BLEEDING: °· Light oozing is expected for 24 hours or so. °· Prop head up with pillows °· Avoid spitting °· Do not confuse bright red fresh flowing blood with lots of saliva colored with a little bit of blood. °· If you notice some bleeding, place gauze or a tea bag where it is bleeding and apply CONSTANT pressure by biting down for 1 hour.  Avoid talking during this time.  Do not remove the gauze or tea bag during this hour to "check" the bleeding. °· If you notice bright RED bleeding FLOWING out of particular area, and filling the floor of your mouth, put   a wad of gauze on that area, bite down firmly and constantly.  Call us immediately.  If we're closed, have someone bring you to the emergency room.  ORAL HYGIENE:  Brush your teeth as usual after meals and before bedtime.  Use a soft toothbrush around the area of surgery.  DO NOT AVOID BRUSHING.  Otherwise bacteria(germs) will grow and may delay healing or encourage infection.  Since you cannot spit, just gently rinse and let the water flow out of your mouth.  DO NOT SWISH HARD.  EATING:  Cool liquids are a good point to start.  Increase to soft foods as tolerated.  PRESCRIPTIONS:  Follow the directions for your prescriptions exactly as written.  If Dr. Kristin Bruins gave you a narcotic pain medication, do not drive, operate  machinery or drink alcohol when on that medication.  QUESTIONS:  Call our office during office hours [phone] or call the Emergency Room at [phone].      Information on my medicine - Coumadin   (Warfarin)  Why was Coumadin prescribed for you? Coumadin was prescribed for you because you have a blood clot or a medical condition that can cause an increased risk of forming blood clots. Blood clots can cause serious health problems by blocking the flow of blood to the heart, lung, or brain. Coumadin can prevent harmful blood clots from forming. As a reminder your indication for Coumadin is:   Blood Clot Prevention After Heart Pump Surgery  What test will check on my response to Coumadin? While on Coumadin (warfarin) you will need to have an INR test regularly to ensure that your dose is keeping you in the desired range. The INR (international normalized ratio) number is calculated from the result of the laboratory test called prothrombin time (PT).  If an INR APPOINTMENT HAS NOT ALREADY BEEN MADE FOR YOU please schedule an appointment to have this lab work done by your health care provider within 7 days. Your INR goal is between: 2-2.5.  Ask your health care provider during an office visit what your goal INR is.  What  do you need to  know  About  COUMADIN? Take Coumadin (warfarin) exactly as prescribed by your healthcare provider about the same time each day.  DO NOT stop taking without talking to the doctor who prescribed the medication.  Stopping without other blood clot prevention medication to take the place of Coumadin may increase your risk of developing a new clot or stroke.  Get refills before you run out.  What do you do if you miss a dose? If you miss a dose, take it as soon as you remember on the same day then continue your regularly scheduled regimen the next day.  Do not take two doses of Coumadin at the same time.  Important Safety Information A possible side effect  of Coumadin (Warfarin) is an increased risk of bleeding. You should call your healthcare provider right away if you experience any of the following: ? Bleeding from an injury or your nose that does not stop. ? Unusual colored urine (red or dark brown) or unusual colored stools (red or black). ? Unusual bruising for unknown reasons. ? A serious fall or if you hit your head (even if there is no bleeding).  Some foods or medicines interact with Coumadin (warfarin) and might alter your response to warfarin. To help avoid this: ? Eat a balanced diet, maintaining a consistent amount of Vitamin K. ? Notify your provider about major diet changes  you plan to make. ? Avoid alcohol or limit your intake to 1 drink for women and 2 drinks for men per day. (1 drink is 5 oz. wine, 12 oz. beer, or 1.5 oz. liquor.)  Make sure that ANY health care provider who prescribes medication for you knows that you are taking Coumadin (warfarin).  Also make sure the healthcare provider who is monitoring your Coumadin knows when you have started a new medication including herbals and non-prescription products.  Coumadin (Warfarin)  Major Drug Interactions  Increased Warfarin Effect Decreased Warfarin Effect  Alcohol (large quantities) Antibiotics (esp. Septra/Bactrim, Flagyl, Cipro) Amiodarone (Cordarone) Aspirin (ASA) Cimetidine (Tagamet) Megestrol (Megace) NSAIDs (ibuprofen, naproxen, etc.) Piroxicam (Feldene) Propafenone (Rythmol SR) Propranolol (Inderal) Isoniazid (INH) Posaconazole (Noxafil) Barbiturates (Phenobarbital) Carbamazepine (Tegretol) Chlordiazepoxide (Librium) Cholestyramine (Questran) Griseofulvin Oral Contraceptives Rifampin Sucralfate (Carafate) Vitamin K   Coumadin (Warfarin) Major Herbal Interactions  Increased Warfarin Effect Decreased Warfarin Effect  Garlic Ginseng Ginkgo biloba Coenzyme Q10 Green tea St. Johns wort    Coumadin (Warfarin) FOOD Interactions  Eat a  consistent number of servings per week of foods HIGH in Vitamin K (1 serving =  cup)  Collards (cooked, or boiled & drained) Kale (cooked, or boiled & drained) Mustard greens (cooked, or boiled & drained) Parsley *serving size only =  cup Spinach (cooked, or boiled & drained) Swiss chard (cooked, or boiled & drained) Turnip greens (cooked, or boiled & drained)  Eat a consistent number of servings per week of foods MEDIUM-HIGH in Vitamin K (1 serving = 1 cup)  Asparagus (cooked, or boiled & drained) Broccoli (cooked, boiled & drained, or raw & chopped) Brussel sprouts (cooked, or boiled & drained) *serving size only =  cup Lettuce, raw (green leaf, endive, romaine) Spinach, raw Turnip greens, raw & chopped   These websites have more information on Coumadin (warfarin):  http://www.king-russell.com/; https://www.hines.net/;

## 2018-02-07 NOTE — Progress Notes (Signed)
LVAD Coordinator Rounding Note:  Admitted 01/18/18 due to fever and chills. Started on broad spectrum antibiotics. Teeth extractions done by Dr. Kristin Bruins 01/20/18. RHC done 01/24/18.   HM III LVAD implanted on 01/27/18 by Dr. Donata Clay under Destination Therapy criteria.  Patient laying in bed. Cardiac rehab RN at bedside about to take patient for a walk. He is in good spirits today.    Vital signs: Temp: 97.6 HR: 94 AV-paced Cuff BP:  95/75 Doppler: 82 O2 Sat: 98% on RA Wt: 137.5>158.7>149.9>147.2>147.9>144.9>143.7>140.8>144.3>141.9 lbs   LVAD interrogation reveals:  Speed: 5700 Flow: 5.0 Power: 4.4w PI: 3.7 Alarms: none Events: none Hematocrit: 29  Fixed speed: 5600 Low speed limit: 5300   Drive Line:  Left lower abdominal dressing clean, dry, intact. Will change this afternoon.    Labs:  LDH trend: 282>324>263>262>472>263>239>244>243   INR trend: 1.26>1.33>1.31>1.30>2.0>2.71>1.37>1.30>1.75>2.41  WBC trend: 8.6>15.6>16.3>14.5>10.4>11.1>14.8>12.5>13.3>12.2  Anticoagulation Plan: -INR Goal:  2.0 - 2.5 -ASA Dose: ASA 324mg  daily until INR therapeutic  Blood Products:  -Intraop: 4 FFP; 450 cell saver   - Post op:  01/27/18>>1 PRBC  Device: - Medtronic ICD - Pacing: DDD 60 - Therapies: turned on 02/04/18  Drips: Milrinone 0.27mcg/kg/min--off 01/30/18 Amiodarone 30mg /hr--restarted 01/30/18--transitioned to PO 01/31/18 Lasix 8mg /hr--off Heparin--started 01/30/18--off 01/31/18  Arrythmias: 01/29/18- VT for roughly 5 minutes.  Amio bolus given.  Respiratory: extubated 01/28/18   Infection: Blood/urine/sputum cultures ordered due to fever 101.3.  - 01/28/18 BC's>> NTD -  01/28/18 urine cultures>>NTD  Adverse Events on VAD: -   VAD Education:  1. Plan to observe dressing change with caregiver Claris Che this afternoon.    Plan/Recommendations:  1. Daily dressing changes per VAD RN, nurse champion, or trained caregiver.  2. Call VAD pager with any  equipment or drive line questions, or needs.   [name] RN VAD Coordinator  Office: [phone]  24/7 Pager: [phone]

## 2018-02-07 NOTE — Progress Notes (Signed)
Occupational Therapy Treatment and Discharge Patient Details Name: Carl Ferguson MRN: [id number] DOB: 1965/06/09 Today's Date: 02/07/2018    History of present illness Pt is a 53 y.o. male admitted 01/18/18 with fever. S/p Heartmate 3 LVAD placement on 10/14. PMH includes ICD recently placed 11/28/46, systolic CHF, NICM, chronic LBBB, tobacco abuse, prior ETOH abuse, prior substance abuse, h/o of device placements with extractions due to infection.    OT comments  Pt is functioning modified independently in ADL and mobility. He generalizes sternal precautions independently. Pt is managing power source change independently and can identify items in black bag. He is knowledgeable in energy conservation strategies. No further OT needs.  Follow Up Recommendations  No OT follow up    Equipment Recommendations  3 in 1 bedside commode    Recommendations for Other Services      Precautions / Restrictions Precautions Precautions: Sternal Precaution Comments: Good recall of sternal precautions with mobility. connected his own device without difficulty       Mobility Bed Mobility Overal bed mobility: Modified Independent             General bed mobility comments: HOB up  Transfers Overall transfer level: Independent Equipment used: None                  Balance Overall balance assessment: Needs assistance   Sitting balance-Leahy Scale: Good     Standing balance support: No upper extremity supported Standing balance-Leahy Scale: Good                             ADL either performed or assessed with clinical judgement   ADL       Grooming: Wash/dry hands;Standing;Modified independent   Upper Body Bathing: Set up;Sitting   Lower Body Bathing: Set up;Sitting/lateral leans   Upper Body Dressing : Set up;Sitting   Lower Body Dressing: Set up;Sit to/from stand   Toilet Transfer: Modified Independent;Ambulation   Toileting- Clothing Manipulation  and Hygiene: Modified independent;Sit to/from stand       Functional mobility during ADLs: Modified independent General ADL Comments: pt educated in energy conservation and verbalized understanding     Vision       Perception     Praxis      Cognition Arousal/Alertness: Awake/alert Behavior During Therapy: WFL for tasks assessed/performed Overall Cognitive Status: Within Functional Limits for tasks assessed                                          Exercises     Shoulder Instructions       General Comments      Pertinent Vitals/ Pain       Pain Assessment: Faces Faces Pain Scale: Hurts a little bit Pain Location: chest Pain Descriptors / Indicators: Sore Pain Intervention(s): Monitored during session  Home Living                                          Prior Functioning/Environment              Frequency           Progress Toward Goals  OT Goals(current goals can now be found in the care plan section)  Progress towards OT goals:  Goals met/education completed, patient discharged from OT  Acute Rehab OT Goals Patient Stated Goal: to go home OT Goal Formulation: With patient Time For Goal Achievement: 02/12/18 Potential to Achieve Goals: Good  Plan All goals met and education completed, patient discharged from OT services    Co-evaluation                 AM-PAC PT "6 Clicks" Daily Activity     Outcome Measure   Help from another person eating meals?: None Help from another person taking care of personal grooming?: None Help from another person toileting, which includes using toliet, bedpan, or urinal?: None Help from another person bathing (including washing, rinsing, drying)?: None Help from another person to put on and taking off regular upper body clothing?: None Help from another person to put on and taking off regular lower body clothing?: None 6 Click Score: 24    End of Session    OT Visit  Diagnosis: Pain   Activity Tolerance Patient tolerated treatment well   Patient Left in bed;with call bell/phone within reach   Nurse Communication          Time: 1131-1146 OT Time Calculation (min): 15 min  Charges: OT General Charges $OT Visit: 1 Visit OT Treatments $Self Care/Home Management : 8-22 mins  Nestor Lewandowsky, OTR/L Acute Rehabilitation Services Pager: [phone] Office: [phone]   Malka So 02/07/2018, 1:55 PM

## 2018-02-07 NOTE — Progress Notes (Addendum)
Patient ID: Carl Ferguson, male   DOB: 1965-07-29, 52 y.o.   MRN: [id number]     Advanced Heart Failure Rounding Note  PCP-Cardiologist: Garwin Brothers, MD   Subjective:    Events 10/11 RHC - Stable on milrinone 0.25 10/14 HM3 implantation; 2 units FFP. CTs and pressors in place out of OR.  10/14 pm. Speed reduced to 5600 after large UOP and many PI events.  10/15 spiked fever up to 103.5. Started on vanc/unasyn. Cxs sent. 02/05/18 underwent ramp with RHC. Speed to 5700.  Coox 42.9% this am. Drawn at 0330. Orders changed to be drawn after 0600, as every repeat has been stable. CVP 9-10   WBC 13.3 -> 14.8 -> 13.5 -> 13.3 -> 12.2. Afebrile.   Hgb 9.9 -> 7.9 -> 8.5 -> 8.2. No overt bleeding.   INR 1.30 -> 1.75 -> 2.41. Discussed dosing with PharmD personally.   Negative 2.8 L and weight down 2 lbs with IV lasix, metolazone, and tolvaptan.   Feeling great. States last night he had the most energy he has had in a long time. Appetite great. Has been getting food from outside. States he will be taking Ensure at home.   LVAD Interrogation HM 3: Speed: 5700 Flow: 5.2 PI: 3.7 Power: 4.0. 0-5 PI events daily.  RHC 02/05/18 at 5600 rpm RA 14 RV 36/14 PA 40/14, mean 25 PCWP mean 13 Oxygen saturations: PA 57% AO 97% Cardiac Output (Fick) 5.4  Cardiac Index (Fick) 3.1 PVR 2.2 WU Cardiac Output (Thermo) 5.8 Cardiac Index (Thermo) 3.3  Objective:   Weight Range: 64.5 kg Body mass index is 22.27 kg/m.   Vital Signs:   Temp:  [97.4 F (36.3 C)-98.7 F (37.1 C)] 97.7 F (36.5 C) (10/25 0329) Pulse Rate:  [59-93] 84 (10/25 0329) Resp:  [19-24] 24 (10/25 0329) BP: (79-119)/(55-91) 99/82 (10/25 0329) SpO2:  [93 %-99 %] 99 % (10/25 0329) Weight:  [64.5 kg] 64.5 kg (10/25 0355) Last BM Date: 02/06/18  Weight change: Filed Weights   02/05/18 0500 02/06/18 0500 02/07/18 0355  Weight: 64 kg 65.6 kg 64.5 kg   Intake/Output:   Intake/Output Summary (Last 24 hours) at  02/07/2018 0755 Last data filed at 02/06/2018 2352 Gross per 24 hour  Intake 565.5 ml  Output 3450 ml  Net -2884.5 ml    Physical Exam    General: Well appearing this am. NAD.  HEENT: Normal. Neck: Supple, JVP 9-10 cm. Carotids OK.  Cardiac:  Mechanical heart sounds with LVAD hum present.  Lungs:  CTAB, normal effort.  Abdomen:  NT, ND, no HSM. No bruits or masses. +BS  LVAD exit site: Dressing dry and intact. No erythema or drainage. Stabilization device present and accurately applied. Driveline dressing changed daily per sterile technique. Extremities:  Warm and dry. No cyanosis, clubbing, rash, or edema.  Neuro:  Alert & oriented x 3. Cranial nerves grossly intact. Moves all 4 extremities w/o difficulty. Affect pleasant     Telemetry   V paced 90s, personally reviewed.   Labs    CBC Recent Labs    02/06/18 0243 02/07/18 0405  WBC 13.3* 12.2*  HGB 8.5* 8.2*  HCT 25.5* 24.5*  MCV 91.7 92.5  PLT 329 312   Basic Metabolic Panel Recent Labs    40/98/11 0656 02/06/18 0243 02/07/18 0405  NA  --  126* 130*  K  --  3.9 3.9  CL  --  89* 91*  CO2  --  27 29  GLUCOSE  --  118* 127*  BUN  --  11 9  CREATININE  --  0.98 0.83  CALCIUM  --  9.0 9.0  MG 1.9  --   --    Liver Function Tests Recent Labs    02/06/18 0243 02/07/18 0405  AST 14* 19  ALT 15 13  ALKPHOS 64 64  BILITOT 0.7 0.8  PROT 6.7 6.2*  ALBUMIN 3.1* 3.0*   No results for input(s): LIPASE, AMYLASE in the last 72 hours. Cardiac Enzymes No results for input(s): CKTOTAL, CKMB, CKMBINDEX, TROPONINI in the last 72 hours.  BNP: BNP (last 3 results) Recent Labs    12/30/17 0858 01/28/18 0542 02/03/18 0045  BNP 4,087.0* 235.0* 549.1*    ProBNP (last 3 results) No results for input(s): PROBNP in the last 8760 hours.   D-Dimer No results for input(s): DDIMER in the last 72 hours. Hemoglobin A1C No results for input(s): HGBA1C in the last 72 hours. Fasting Lipid Panel No results for  input(s): CHOL, HDL, LDLCALC, TRIG, CHOLHDL, LDLDIRECT in the last 72 hours. Thyroid Function Tests No results for input(s): TSH, T4TOTAL, T3FREE, THYROIDAB in the last 72 hours.  Invalid input(s): FREET3  Other results:   Imaging    No results found.   Medications:     Scheduled Medications: . amiodarone  200 mg Oral BID  . aspirin EC  81 mg Oral Daily  . bisacodyl  10 mg Oral Daily   Or  . bisacodyl  10 mg Rectal Daily  . Chlorhexidine Gluconate Cloth  6 each Topical Daily  . docusate sodium  200 mg Oral Daily  . feeding supplement (ENSURE ENLIVE)  237 mL Oral TID BM  . furosemide  80 mg Intravenous BID  . gabapentin  400 mg Oral BID  . insulin aspart  0-24 Units Subcutaneous TID AC & HS  . losartan  25 mg Oral Daily  . mouth rinse  15 mL Mouth Rinse BID  . pantoprazole  40 mg Oral Daily  . potassium chloride  40 mEq Oral BID  . sildenafil  20 mg Oral TID  . sodium chloride flush  10-40 mL Intracatheter Q12H  . sodium chloride flush  3 mL Intravenous Q12H  . sodium chloride flush  3 mL Intravenous Q12H  . Warfarin - Pharmacist Dosing Inpatient   Does not apply q1800    Infusions: . sodium chloride Stopped (02/03/18 1007)  . sodium chloride    . ferumoxytol 510 mg (02/05/18 1618)    PRN Medications: sodium chloride, sodium chloride, acetaminophen, hydrALAZINE, lip balm, ondansetron (ZOFRAN) IV, oxyCODONE, sodium chloride flush, traMADol  Assessment/Plan   1. Chronic Systolic CHF: S/p HM3 01/27/18.  Nonischemic cardiomyopathy, diagnosed in 2011, initially followed at [facility].  LHC (3/19) with no significant CAD.  CPX (3/19) submaximal, but suggestive of mild to moderate HF limitation. HIV negative 3/19, history of drug/ETOH abuse in the past, no drugs now and had cut back ETOH considerably.  Echo 9/19 showed EF 20% with severe LV dilation, severe MR.  NYHA class IV symptoms. He has a wide LBBB.  RHC was done in 9/19 showing low output, CI 1.4.  He was admitted  to start milrinone, unable to wean off milrinone and remained on 0.25 mcg/kg/min at home.  Medtronic ICD was placed, he has a His bundle lead but EP was unable to get an LV lead in place => His bundle pacing does not narrow his QRS and did not help [place] wean him off milrinone.  He  did well on milrinone 0.25 mcg/kg/min, NYHA class II.  RHC 10/11 shows filling pressures and cardiac output to be fairly well optimized. HM3 placed 10/14.  Now off milrinone post-op.  - Speed increased to 5700 02/05/18 with echo + RHC LVAD optimization.  - Coox 42.9% but drawn at 3 am. RHC stable 02/05/18.  - Hgb 8.2 this am.   - Volume status improving.  - CVP 9-10. Will change back to po diuretics with torsemide at 80 mg daily.  - Continue losartan 25 mg daily - Will add spiro 12.5 mg daily.  - INR 2.4. Discussed dosing with PharmD personally.  Now on ASA 81. - Continue walking.  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 2. ID: Treated with Augmentin for dental infection pre-op.  POD#1 had fever of 103.5. Started on Vanc/Unasyn.  Afebrile now. WBCs trending down. Afebrile.  - Cultures NGTD and off antibiotics.  He has had episodes of loose stool. - C diff negative.  3. ETOH Abuse: No longer drinking. No change.  4. Tobacco abuse: He has quit smoking since admission in 9/19.  - Continue to encourage complete cessation. No change.  5. Mitral regurgitation: Severe functional MR.  With LV dilated > 7 cm, suspect Mitraclip would be unlikely to help him appreciably.  Decreased MR s/p LVAD placement.  No change.  6. VT: Patient had episode of VT requiring ATP prior to admission. Had VT for around 5 minutes 01/29/18.   - Continue po amiodarone.  No change.  7. Hypokalemia:  - K 3.9. Continue to follow.  8. Hyponatremia:  - Na 130 after several doses of Tolvaptan.  Graciella Freer, PA-C  02/07/2018 7:55 AM   VAD Team Pager [phone] (7am - 7am) ++VAD ISSUES ONLY++     Advanced Heart Failure Team Pager [phone] (M-F; 7a - 4p)  Please contact [facility] for night-coverage after hours (4p -7a ) and weekends on [email or web address]  Patient seen with PA, agree with the above note.   He is doing well.  Persistently low co-ox but feels good and cardiac output on RHC this week was excellent.  Continue sildenafil for evidence of some RV failure.   LVAD parameters stable.   He is ready to go home soon.  We are going to keep him until Monday given housing issues.   Marca Ancona 02/07/2018 4:30 PM

## 2018-02-07 NOTE — Progress Notes (Signed)
ANTICOAGULATION CONSULT NOTE - Follow Up Consult  Pharmacy Consult for Coumadin Indication: LVAD  Allergies  Allergen Reactions  . Norflex [Orphenadrine] Swelling    FACE [ANGIOEDEMA]    Patient Measurements: Height: 5\' 7"  (170.2 cm) Weight: 142 lb 3.2 oz (64.5 kg) IBW/kg (Calculated) : 66.1 Heparin Dosing Weight: 65.9 kg  Vital Signs: Temp: 97.6 F (36.4 C) (10/25 0810) Temp Source: Oral (10/25 0810) BP: 95/75 (10/25 0810) Pulse Rate: 89 (10/25 0810)  Labs: Recent Labs    02/04/18 2300  02/05/18 0349 02/05/18 0350 02/05/18 0846 02/06/18 0230 02/06/18 0243 02/07/18 0405  HGB  --    < > 7.9*  --  8.3*  --  8.5* 8.2*  HCT  --    < > 23.1*  --  24.1*  --  25.5* 24.5*  PLT  --    < > 329  --  303  --  329 312  LABPROT  --   --  16.0*  --   --   --  20.2* 25.9*  INR  --   --  1.30  --   --   --  1.75 2.41  HEPARINUNFRC 0.42  --   --  0.26*  --  0.36  --   --   CREATININE  --   --  0.86  --   --   --  0.98 0.83   < > = values in this interval not displayed.    Estimated Creatinine Clearance: 96.1 mL/min (by C-G formula based on SCr of 0.83 mg/dL).  Assessment: 52 yr old male s/p HM3 on 10/14. Coumadin held on 10/18 after significant jump in INR 1.3>2.24.  INR from 1.30 > 1.75 > 2.41, s/p warfarin 4 mg yesterday, 7.5 mg x1 the day prior. Hb 8.2. No signs/symptoms of bleeding. LDH stable.    Goal of Therapy:  INR 2-2.5 Monitor platelets by anticoagulation protocol: Yes   Plan:  - Warfarin 1 mg x1  - Daily heparin level, PT/INR and CBC with am labs.   Marcelino Freestone, PharmD PGY2 Cardiology Pharmacy Resident Phone [phone] Please check AMION for all Pharmacist numbers by unit 02/07/2018 10:58 AM

## 2018-02-07 NOTE — Progress Notes (Signed)
VAD Education Note:  Patient's caregiver Claris Che supervised during dressing change.    Drive Line:  Existing VAD dressing removed and site care performed using sterile technique. Drive line exit site cleaned with Chlora prep applicators x 2, allowed to dry, and gauze dressing with silver strip re-applied. 2 sutures intact. Exit site healing and unincorporated, the velour is fully implanted at exit site. No redness, tenderness, drainage, foul odor or rash noted. Drive line anchor re-applied.   Claris Che has show proficiency with sterile technique, and dressing change. All questions answered at this time.   [name] RN VAD Coordinator  Office: [phone]  24/7 Pager: [phone]

## 2018-02-07 NOTE — Plan of Care (Signed)
  Problem: Health Behavior/Discharge Planning: Goal: Ability to manage health-related needs will improve Outcome: Progressing   Problem: Health Behavior/Discharge Planning: Goal: Ability to manage health-related needs will improve Outcome: Progressing   Problem: Clinical Measurements: Goal: Ability to maintain clinical measurements within normal limits will improve Outcome: Progressing Goal: Will remain free from infection Outcome: Progressing Goal: Diagnostic test results will improve Outcome: Progressing Goal: Respiratory complications will improve Outcome: Progressing Goal: Cardiovascular complication will be avoided Outcome: Progressing   Problem: Elimination: Goal: Will not experience complications related to bowel motility Outcome: Progressing Goal: Will not experience complications related to urinary retention Outcome: Progressing   Problem: Education: Goal: Required Educational Video(s) Outcome: Progressing   Problem: Clinical Measurements: Goal: Postoperative complications will be avoided or minimized Outcome: Progressing   Problem: Skin Integrity: Goal: Demonstration of wound healing without infection will improve Outcome: Progressing   Problem: Education: Goal: Knowledge of the prescribed therapeutic regimen will improve Outcome: Progressing   Problem: Activity: Goal: Risk for activity intolerance will decrease Outcome: Progressing   Problem: Cardiac: Goal: Ability to maintain an adequate cardiac output will improve Outcome: Progressing   Problem: Coping: Goal: Level of anxiety will decrease Outcome: Progressing   Problem: Fluid Volume: Goal: Risk for excess fluid volume will decrease Outcome: Progressing   Problem: Clinical Measurements: Goal: Ability to maintain clinical measurements within normal limits will improve Outcome: Progressing Goal: Will remain free from infection Outcome: Progressing   Problem: Respiratory: Goal: Will regain  and/or maintain adequate ventilation Outcome: Progressing   Problem: Education: Goal: Understanding of CV disease, CV risk reduction, and recovery process will improve Outcome: Progressing Goal: Individualized Educational Video(s) Outcome: Progressing   Problem: Activity: Goal: Ability to return to baseline activity level will improve Outcome: Progressing   Problem: Cardiovascular: Goal: Ability to achieve and maintain adequate cardiovascular perfusion will improve Outcome: Progressing Goal: Vascular access site(s) Level 0-1 will be maintained Outcome: Progressing   Problem: Health Behavior/Discharge Planning: Goal: Ability to safely manage health-related needs after discharge will improve Outcome: Progressing

## 2018-02-07 NOTE — Progress Notes (Signed)
2 Days Post-Op Procedure(s) (LRB): RIGHT HEART CATH (N/A) Subjective: Patient with much more energy since HM3 implant EPWs pulled w/o difficulty VS Q 30 min for 2 hr bed rest Should be ready for DC soon Objective: Vital signs in last 24 hours: Temp:  [97.4 F (36.3 C)-98.4 F (36.9 C)] 97.6 F (36.4 C) (10/25 1111) Pulse Rate:  [59-93] 93 (10/25 1111) Cardiac Rhythm: Ventricular paced (10/25 0745) Resp:  [15-29] 29 (10/25 1230) BP: (86-119)/(53-91) 86/53 (10/25 1230) SpO2:  [98 %-100 %] 100 % (10/25 1111) Weight:  [64.5 kg] 64.5 kg (10/25 0355)  Hemodynamic parameters for last 24 hours: CVP:  [10 mmHg-18 mmHg] 14 mmHg  Intake/Output from previous day: 10/24 0701 - 10/25 0700 In: 565.5 [P.O.:540; I.V.:25.5] Out: 3450 [Urine:3450] Intake/Output this shift: Total I/O In: 480 [P.O.:480] Out: 2025 [Urine:2025]  Lungs clear Normal VAD hum No edema Incisions clean, dry  Lab Results: Recent Labs    02/06/18 0243 02/07/18 0405  WBC 13.3* 12.2*  HGB 8.5* 8.2*  HCT 25.5* 24.5*  PLT 329 312   BMET:  Recent Labs    02/06/18 0243 02/07/18 0405  NA 126* 130*  K 3.9 3.9  CL 89* 91*  CO2 27 29  GLUCOSE 118* 127*  BUN 11 9  CREATININE 0.98 0.83  CALCIUM 9.0 9.0    PT/INR:  Recent Labs    02/07/18 0405  LABPROT 25.9*  INR 2.41   ABG    Component Value Date/Time   PHART 7.535 (H) 01/30/2018 0530   HCO3 31.0 (H) 02/05/2018 0943   HCO3 29.1 (H) 02/05/2018 0943   TCO2 32 02/05/2018 0943   TCO2 30 02/05/2018 0943   ACIDBASEDEF 2.0 01/28/2018 1738   O2SAT 45.9 02/07/2018 0802   CBG (last 3)  Recent Labs    02/06/18 2106 02/07/18 0806 02/07/18 1220  GLUCAP 127* 99 90    Assessment/Plan: S/P Procedure(s) (LRB): RIGHT HEART CATH (N/A) Coumadin per pharmD   LOS: 19 days    Kathlee Nations Trigt III 02/07/2018

## 2018-02-08 LAB — GLUCOSE, CAPILLARY
Glucose-Capillary: 112 mg/dL — ABNORMAL HIGH (ref 70–99)
Glucose-Capillary: 120 mg/dL — ABNORMAL HIGH (ref 70–99)
Glucose-Capillary: 133 mg/dL — ABNORMAL HIGH (ref 70–99)

## 2018-02-08 LAB — COOXEMETRY PANEL
CARBOXYHEMOGLOBIN: 1.7 % — AB (ref 0.5–1.5)
METHEMOGLOBIN: 1.4 % (ref 0.0–1.5)
O2 SAT: 52.6 %
TOTAL HEMOGLOBIN: 9 g/dL — AB (ref 12.0–16.0)

## 2018-02-08 LAB — COMPREHENSIVE METABOLIC PANEL
ALK PHOS: 80 U/L (ref 38–126)
ALT: 13 U/L (ref 0–44)
ANION GAP: 11 (ref 5–15)
AST: 18 U/L (ref 15–41)
Albumin: 3.1 g/dL — ABNORMAL LOW (ref 3.5–5.0)
BUN: 11 mg/dL (ref 6–20)
CHLORIDE: 85 mmol/L — AB (ref 98–111)
CO2: 35 mmol/L — ABNORMAL HIGH (ref 22–32)
Calcium: 9.5 mg/dL (ref 8.9–10.3)
Creatinine, Ser: 0.9 mg/dL (ref 0.61–1.24)
Glucose, Bld: 98 mg/dL (ref 70–99)
POTASSIUM: 3.7 mmol/L (ref 3.5–5.1)
Sodium: 131 mmol/L — ABNORMAL LOW (ref 135–145)
Total Bilirubin: 0.5 mg/dL (ref 0.3–1.2)
Total Protein: 6.9 g/dL (ref 6.5–8.1)

## 2018-02-08 LAB — PROTIME-INR
INR: 2.28
Prothrombin Time: 24.8 seconds — ABNORMAL HIGH (ref 11.4–15.2)

## 2018-02-08 LAB — CBC
HEMATOCRIT: 26.5 % — AB (ref 39.0–52.0)
HEMOGLOBIN: 8.7 g/dL — AB (ref 13.0–17.0)
MCH: 30.9 pg (ref 26.0–34.0)
MCHC: 32.8 g/dL (ref 30.0–36.0)
MCV: 94 fL (ref 80.0–100.0)
Platelets: 374 10*3/uL (ref 150–400)
RBC: 2.82 MIL/uL — ABNORMAL LOW (ref 4.22–5.81)
RDW: 13.6 % (ref 11.5–15.5)
WBC: 13 10*3/uL — AB (ref 4.0–10.5)
nRBC: 0 % (ref 0.0–0.2)

## 2018-02-08 LAB — LACTATE DEHYDROGENASE: LDH: 259 U/L — ABNORMAL HIGH (ref 98–192)

## 2018-02-08 MED ORDER — WARFARIN SODIUM 4 MG PO TABS
4.0000 mg | ORAL_TABLET | Freq: Once | ORAL | Status: AC
  Administered 2018-02-08: 4 mg via ORAL
  Filled 2018-02-08: qty 1

## 2018-02-08 NOTE — Progress Notes (Signed)
3 Days Post-Op Procedure(s) (LRB): RIGHT HEART CATH (N/A) Subjective: Patient resting comfortably with stable vital signs No hematoma at site of epicardial pacer wire removal Patient ready for discharge to temporary hotel lodging on Monday I have reviewed discharge instructions regarding wound care, activity limitations, and importance of heart healthy diet and lifestyle. Objective: Vital signs in last 24 hours: Temp:  [97.4 F (36.3 C)-99.1 F (37.3 C)] 99.1 F (37.3 C) (10/26 1207) Pulse Rate:  [41-101] 101 (10/26 1207) Cardiac Rhythm: Ventricular paced (10/26 1230) Resp:  [13-25] 20 (10/26 1207) BP: (76-101)/(56-87) 83/72 (10/26 1207) SpO2:  [95 %-99 %] 95 % (10/26 1207) Weight:  [63.3 kg] 63.3 kg (10/26 0349)  Hemodynamic parameters for last 24 hours: CVP:  [13 mmHg-16 mmHg] 16 mmHg  Intake/Output from previous day: 10/25 0701 - 10/26 0700 In: 1560 [P.O.:1560] Out: 4975 [Urine:4975] Intake/Output this shift: Total I/O In: 600 [P.O.:600] Out: 1000 [Urine:1000]  Alert and comfortable Lungs clear Surgical incisions clean and dry Normal VAD hum  Lab Results: Recent Labs    02/07/18 0405 02/08/18 0645  WBC 12.2* 13.0*  HGB 8.2* 8.7*  HCT 24.5* 26.5*  PLT 312 374   BMET:  Recent Labs    02/07/18 0405 02/08/18 0645  NA 130* 131*  K 3.9 3.7  CL 91* 85*  CO2 29 35*  GLUCOSE 127* 98  BUN 9 11  CREATININE 0.83 0.90  CALCIUM 9.0 9.5    PT/INR:  Recent Labs    02/08/18 0645  LABPROT 24.8*  INR 2.28   ABG    Component Value Date/Time   PHART 7.535 (H) 01/30/2018 0530   HCO3 31.0 (H) 02/05/2018 0943   HCO3 29.1 (H) 02/05/2018 0943   TCO2 32 02/05/2018 0943   TCO2 30 02/05/2018 0943   ACIDBASEDEF 2.0 01/28/2018 1738   O2SAT 52.6 02/08/2018 0636   CBG (last 3)  Recent Labs    02/07/18 2117 02/08/18 0813 02/08/18 1204  GLUCAP 106* 112* 120*    Assessment/Plan: S/P Procedure(s) (LRB): RIGHT HEART CATH (N/A) Looks ready for discharge on  Monday   LOS: 20 days    Kathlee Nations Trigt III 02/08/2018

## 2018-02-08 NOTE — Progress Notes (Signed)
Patient ID: Carl Ferguson, male   DOB: 10/20/1965, 52 y.o.   MRN: [id number]     Advanced Heart Failure Rounding Note  PCP-Cardiologist: Garwin Brothers, MD   Subjective:    Events 10/11 RHC - Stable on milrinone 0.25 10/14 HM3 implantation; 2 units FFP. CTs and pressors in place out of OR.  10/14 pm. Speed reduced to 5600 after large UOP and many PI events.  10/15 spiked fever up to 103.5. Started on vanc/unasyn. Cxs sent. 02/05/18 underwent ramp with RHC. Speed to 5700.   Feels great. Says he can't believe he feels this good off milrinone. Walking halls without SOB. No orthopnea or PND.   Diuresed well on oral torsemide. Weight down 3 pounds overnight. Co-ox 53% INR 2.3   LVAD Interrogation HM 3: Speed: 5700 Flow: 5.1 PI: 3.2 Power: 4.0. VAD interrogated personally. Parameters stable.    Objective:   Weight Range: 63.3 kg Body mass index is 21.86 kg/m.   Vital Signs:   Temp:  [97.4 F (36.3 C)-99.1 F (37.3 C)] 99.1 F (37.3 C) (10/26 1207) Pulse Rate:  [41-101] 101 (10/26 1207) Resp:  [17-25] 20 (10/26 1207) BP: (76-97)/(57-74) 83/72 (10/26 1207) SpO2:  [95 %-99 %] 98 % (10/26 1549) Weight:  [63.3 kg] 63.3 kg (10/26 0349) Last BM Date: 02/06/18  Weight change: Filed Weights   02/06/18 0500 02/07/18 0355 02/08/18 0349  Weight: 65.6 kg 64.5 kg 63.3 kg   Intake/Output:   Intake/Output Summary (Last 24 hours) at 02/08/2018 1833 Last data filed at 02/08/2018 1600 Gross per 24 hour  Intake 1320 ml  Output 4200 ml  Net -2880 ml    Physical Exam    MAPs 80s   General:  NAD.  HEENT: normal  Neck: supple. JVP 10  Carotids 2+ bilat; no bruits. No lymphadenopathy or thryomegaly appreciated. Cor: LVAD hum.  Lungs: Clear. Abdomen: soft, nontender, non-distended. No hepatosplenomegaly. No bruits or masses. Good bowel sounds. Driveline site clean. Anchor in place.  Extremities: no cyanosis, clubbing, rash. Warm no edema  Neuro: alert & oriented x 3. No focal  deficits. Moves all 4 without problem    Telemetry   V paced 90-100, Personally reviewed  Labs    CBC Recent Labs    02/07/18 0405 02/08/18 0645  WBC 12.2* 13.0*  HGB 8.2* 8.7*  HCT 24.5* 26.5*  MCV 92.5 94.0  PLT 312 374   Basic Metabolic Panel Recent Labs    64/33/29 0405 02/08/18 0645  NA 130* 131*  K 3.9 3.7  CL 91* 85*  CO2 29 35*  GLUCOSE 127* 98  BUN 9 11  CREATININE 0.83 0.90  CALCIUM 9.0 9.5   Liver Function Tests Recent Labs    02/07/18 0405 02/08/18 0645  AST 19 18  ALT 13 13  ALKPHOS 64 80  BILITOT 0.8 0.5  PROT 6.2* 6.9  ALBUMIN 3.0* 3.1*   No results for input(s): LIPASE, AMYLASE in the last 72 hours. Cardiac Enzymes No results for input(s): CKTOTAL, CKMB, CKMBINDEX, TROPONINI in the last 72 hours.  BNP: BNP (last 3 results) Recent Labs    12/30/17 0858 01/28/18 0542 02/03/18 0045  BNP 4,087.0* 235.0* 549.1*    ProBNP (last 3 results) No results for input(s): PROBNP in the last 8760 hours.   D-Dimer No results for input(s): DDIMER in the last 72 hours. Hemoglobin A1C No results for input(s): HGBA1C in the last 72 hours. Fasting Lipid Panel No results for input(s): CHOL, HDL, LDLCALC, TRIG, CHOLHDL,  LDLDIRECT in the last 72 hours. Thyroid Function Tests No results for input(s): TSH, T4TOTAL, T3FREE, THYROIDAB in the last 72 hours.  Invalid input(s): FREET3  Other results:   Imaging    No results found.   Medications:     Scheduled Medications: . amiodarone  200 mg Oral BID  . aspirin EC  81 mg Oral Daily  . bisacodyl  10 mg Oral Daily   Or  . bisacodyl  10 mg Rectal Daily  . Chlorhexidine Gluconate Cloth  6 each Topical Daily  . docusate sodium  200 mg Oral Daily  . feeding supplement (ENSURE ENLIVE)  237 mL Oral BID BM  . gabapentin  400 mg Oral BID  . insulin aspart  0-24 Units Subcutaneous TID AC & HS  . losartan  25 mg Oral Daily  . mouth rinse  15 mL Mouth Rinse BID  . pantoprazole  40 mg Oral Daily    . potassium chloride  40 mEq Oral Daily  . sildenafil  20 mg Oral TID  . sodium chloride flush  10-40 mL Intracatheter Q12H  . sodium chloride flush  3 mL Intravenous Q12H  . sodium chloride flush  3 mL Intravenous Q12H  . spironolactone  12.5 mg Oral Daily  . torsemide  80 mg Oral Daily  . Warfarin - Pharmacist Dosing Inpatient   Does not apply q1800    Infusions: . sodium chloride Stopped (02/03/18 1007)  . sodium chloride      PRN Medications: sodium chloride, sodium chloride, acetaminophen, hydrALAZINE, lip balm, ondansetron (ZOFRAN) IV, oxyCODONE, sodium chloride flush, traMADol  Assessment/Plan   1. Chronic Systolic CHF: S/p HM3 01/27/18.  Nonischemic cardiomyopathy, diagnosed in 2011, initially followed at [facility].  LHC (3/19) with no significant CAD.  CPX (3/19) submaximal, but suggestive of mild to moderate HF limitation. HIV negative 3/19, history of drug/ETOH abuse in the past, no drugs now and had cut back ETOH considerably.  Echo 9/19 showed EF 20% with severe LV dilation, severe MR.  NYHA class IV symptoms. He has a wide LBBB.  RHC was done in 9/19 showing low output, CI 1.4.  He was admitted to start milrinone, unable to wean off milrinone and remained on 0.25 mcg/kg/min at home.  [employer]tr[employer] ICD was placed, he has a His bundle lead but EP was unable to get an LV lead in place => His bundle pacing does not narrow his QRS and did not help [place] wean him off milrinone. -  -  HM3 placed 10/14.  - Speed increased to 5700 02/05/18 with echo + RHC LVAD optimization.  - Coox 42.9%-> 53%  Co-ox improving but still a bit low. However RHC from a few days ago with good CO.  - Hgb 8.2 -> 8.7 this am.   - Volume status improving. Weight down 3 pounds on po torsemide.  - CVP 10. Will continue torsemide at current dose. May need to cut back. To avoid dropping preload too much - Continue losartan 25 mg daily - Continue spiro 12.5 mg daily.  - INR 2.3 Discussed dosing with PharmD  personally.  Now on ASA 81. 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 2. ID: Treated with Augmentin for dental infection pre-op.  POD#1 had fever of 103.5. Started on Vanc/Unasyn.  Afebrile now. WBCs trending down. Afebrile.  - Cultures NGTD and off antibiotics.  He has had episodes of loose stool. - C diff negative.  3.  ETOH Abuse: No longer drinking. No change.  4. Tobacco abuse: He has quit smoking since admission in 9/19.  - Continue to encourage complete cessation. No change.  5. Mitral regurgitation: Severe functional MR.  With LV dilated > 7 cm, suspect Mitraclip would be unlikely to help him appreciably.  Decreased MR s/p LVAD placement.  No change.  6. VT: Patient had episode of VT requiring ATP prior to admission. Had VT for around 5 minutes 01/29/18.   - Continue po amiodarone.  No change.  7. Hypokalemia:  - K 3.7. Will supp 8. Hyponatremia:  - Na 131 after several doses of Tolvaptan.  Hopefully d/c Monday with temporary housing.   Arvilla Meres, MD  02/08/2018 6:33 PM   VAD Team Pager [phone] (7am - 7am) ++VAD ISSUES ONLY++   Advanced Heart Failure Team Pager [phone] (M-F; 7a - 4p)  Please contact [facility] for night-coverage after hours (4p -7a ) and weekends on [email or web address]

## 2018-02-08 NOTE — Progress Notes (Signed)
Pt stated " dressing not changed today"  No date noted on drive line dressing.  2H notified for dressing change since new LVAD and daily dressing change needed.  Will continue to monitor Carl Ferguson

## 2018-02-08 NOTE — Progress Notes (Signed)
ANTICOAGULATION CONSULT NOTE - Follow Up Consult  Pharmacy Consult for Coumadin Indication: LVAD  Allergies  Allergen Reactions  . Norflex [Orphenadrine] Swelling    FACE [ANGIOEDEMA]    Patient Measurements: Height: 5\' 7"  (170.2 cm) Weight: 139 lb 8.8 oz (63.3 kg) IBW/kg (Calculated) : 66.1 Heparin Dosing Weight: 65.9 kg  Vital Signs: Temp: 98.4 F (36.9 C) (10/26 0818) Temp Source: Oral (10/26 0818) BP: 97/74 (10/26 0818) Pulse Rate: 95 (10/26 0818)  Labs: Recent Labs    02/06/18 0230  02/06/18 0243 02/07/18 0405 02/08/18 0645  HGB  --    < > 8.5* 8.2* 8.7*  HCT  --   --  25.5* 24.5* 26.5*  PLT  --   --  329 312 374  LABPROT  --   --  20.2* 25.9*  --   INR  --   --  1.75 2.41  --   HEPARINUNFRC 0.36  --   --   --   --   CREATININE  --   --  0.98 0.83 0.90   < > = values in this interval not displayed.    Estimated Creatinine Clearance: 86.9 mL/min (by C-G formula based on SCr of 0.9 mg/dL).  Assessment: 52 yr old male s/p HM3 on 10/14. Coumadin held on 10/18 after significant jump in INR 1.3>2.24.  INR from 1.30 > 1.75 > 2.41>2.2, Hb 8.7. No signs/symptoms of bleeding. LDH stable.    Goal of Therapy:  INR 2-2.5 Monitor platelets by anticoagulation protocol: Yes   Plan:  -Warfarin 4mg  tonight - Daily heparin level, PT/INR and CBC with am labs.  Sheppard Coil PharmD., BCPS Clinical Pharmacist 02/08/2018 10:17 AM

## 2018-02-08 NOTE — Progress Notes (Signed)
2H having to delay LVAD dressing change r/Ferguson codes on floor.   Called LVAD coordinator per Maralyn Sago ok to wait until tomorrow for caregiver to change with observation. Pt informed. Will continue to monitor Carl Ferguson

## 2018-02-09 LAB — CBC
HCT: 25.9 % — ABNORMAL LOW (ref 39.0–52.0)
HEMOGLOBIN: 8.5 g/dL — AB (ref 13.0–17.0)
MCH: 30.5 pg (ref 26.0–34.0)
MCHC: 32.8 g/dL (ref 30.0–36.0)
MCV: 92.8 fL (ref 80.0–100.0)
PLATELETS: 368 10*3/uL (ref 150–400)
RBC: 2.79 MIL/uL — ABNORMAL LOW (ref 4.22–5.81)
RDW: 13.6 % (ref 11.5–15.5)
WBC: 12.6 10*3/uL — AB (ref 4.0–10.5)
nRBC: 0 % (ref 0.0–0.2)

## 2018-02-09 LAB — LACTATE DEHYDROGENASE: LDH: 249 U/L — AB (ref 98–192)

## 2018-02-09 LAB — PROTIME-INR
INR: 2.22
PROTHROMBIN TIME: 24.3 s — AB (ref 11.4–15.2)

## 2018-02-09 LAB — BASIC METABOLIC PANEL
ANION GAP: 16 — AB (ref 5–15)
BUN: 16 mg/dL (ref 6–20)
CALCIUM: 9.5 mg/dL (ref 8.9–10.3)
CO2: 32 mmol/L (ref 22–32)
CREATININE: 1.09 mg/dL (ref 0.61–1.24)
Chloride: 79 mmol/L — ABNORMAL LOW (ref 98–111)
GFR calc Af Amer: >60 mL/min (ref >60)
GFR calc non Af Amer: >60 mL/min (ref >60)
GLUCOSE: 173 mg/dL — AB (ref 70–99)
Potassium: 3.1 mmol/L — ABNORMAL LOW (ref 3.5–5.1)
Sodium: 127 mmol/L — ABNORMAL LOW (ref 135–145)

## 2018-02-09 LAB — GLUCOSE, CAPILLARY
GLUCOSE-CAPILLARY: 120 mg/dL — AB (ref 70–99)
GLUCOSE-CAPILLARY: 118 mg/dL — AB (ref 70–99)
Glucose-Capillary: 107 mg/dL — ABNORMAL HIGH (ref 70–99)
Glucose-Capillary: 115 mg/dL — ABNORMAL HIGH (ref 70–99)

## 2018-02-09 LAB — COOXEMETRY PANEL
CARBOXYHEMOGLOBIN: 1.9 % — AB (ref 0.5–1.5)
Methemoglobin: 1.2 % (ref 0.0–1.5)
O2 Saturation: 55.4 %
Total hemoglobin: 9.1 g/dL — ABNORMAL LOW (ref 12.0–16.0)

## 2018-02-09 MED ORDER — POLYETHYLENE GLYCOL 3350 17 G PO PACK: 17.0000 g | PACK | Freq: Every day | ORAL | Status: DC | PRN

## 2018-02-09 MED ORDER — POTASSIUM CHLORIDE CRYS ER 20 MEQ PO TBCR
40.0000 meq | EXTENDED_RELEASE_TABLET | Freq: Once | ORAL | Status: AC
  Administered 2018-02-09: 40 meq via ORAL
  Filled 2018-02-09: qty 2

## 2018-02-09 MED ORDER — AMIODARONE HCL 200 MG PO TABS
200.0000 mg | ORAL_TABLET | Freq: Every day | ORAL | Status: DC
  Administered 2018-02-10: 200 mg via ORAL
  Filled 2018-02-09: qty 1

## 2018-02-09 MED ORDER — WARFARIN SODIUM 4 MG PO TABS
4.0000 mg | ORAL_TABLET | Freq: Once | ORAL | Status: AC
  Administered 2018-02-09: 4 mg via ORAL
  Filled 2018-02-09: qty 1

## 2018-02-09 MED ORDER — TORSEMIDE 20 MG PO TABS: 60.0000 mg | ORAL_TABLET | Freq: Every day | ORAL | Status: DC

## 2018-02-09 NOTE — Discharge Summary (Signed)
Advanced Heart Failure Team  Discharge Summary   Patient ID: Carl Ferguson MRN: [id number], DOB/AGE: 1965-06-11 52 y.o. Admit date: 01/18/2018 D/C date:     02/10/2018   Primary Discharge Diagnoses:  1. 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 ID  3. ETOH Abuses 4. Tobacco Abuse 5. Mitral Regurgitation  6. VT  7. Hypokalemia  8. Hyponatremia  Hospital Course:  FISHEL Carl Ferguson is a 52 y.o. male with a history of chronic systolic HF previously on home milrinone, NICM s/p Medtronic ICD, chronic LBBB, tobacco abuse, ETOH abuse, substance abuse, hx of device plaments with extractions due to infection, and mitral regurgitation.  He was admitted for LVAD work up. Underwent HM3 implantation on 01/27/18. 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 See problem based review of hospitalization below.  The VAD Coordinators provided extensive VAD education to him and his family. He will be followed closely in VAD clinic, with appointment as below. [facility] with follow him for [facility]. 30 day supply of medications provided through HF fund (medicaid pending). Printed prescriptions given for pain medications.   1. Chronic Systolic CHF: S/p HM3 01/27/18.  Nonischemic cardiomyopathy, diagnosed in 2011, initially followed at /[facility]. LHC (3/19) with no significant CAD. CPX (3/19) submaximal, but suggestive of mild to moderate HF limitation. HIV negative 3/19, history of drug/ETOH abuse in the past, no drugs now and hadcut back ETOH considerably. Echo 9/19 showedEF 20% with severe LV dilation, severe MR. NYHA class IV symptoms. He has a wide LBBB. RHC was done in 9/19 showing low output, CI 1.4. He was admitted to start milrinone, unable to wean off milrinone and remained on 0.25 mcg/kg/min at home. Medtronic ICD was placed, he has a His bundle lead but EP was unable to get an LV lead  in place =>His bundle pacing does not narrow his QRS and did not help [place] wean him off milrinone.  HM3 placed 10/14. Speed increased to 5700 02/05/18 with echo + RHC LVAD optimization.  - Continue torsemide 40 mg daily.  - Continue losartan 25 mg daily - Continue spiro 12.5 mg daily.  - Continue warfarin and ASA 81. - Continue Sildenafil 20 mg TID added for evidence of RV failure with PAPI 1.85. 2. ID: Treated with Augmentin for dental infection pre-op.  POD#1 had fever of 103.5. Started on Vanc/Unasyn.  Afebrile now. - Cultures NGTD and off antibiotics.  He has had episodes of loose stool. C diff negative.  3. ETOH Abuse:No longer drinking.  4. Tobacco abuse: He has quit smoking since admission in 9/19.  - Continue to encourage complete cessation. 5. Mitral regurgitation:Severe functional MR. With LV dilated >7 cm, suspect Mitraclip would be unlikely to help him appreciably. Decreased MR s/p LVAD placement.   6. VT: Patient had episode of VT requiring ATP prior to admission. Had VT for around 5 minutes 01/29/18.   - Continue 200 mg amiodarone daily.  7. Hypokalemia:  - Potassium monitored closely and replaced as needed.  8. Hyponatremia:  - Na 128 on day of discharge. Improved with fluid restriction. Will need a BMET at follow up.   LVAD Interrogation HM II:   Speed: 5700     Flow: 5.3      PI: 3.1      Power: 4.0        Discharge Weight Range: 140  lbs. Discharge Vitals: Blood pressure 107/66, pulse 88, temperature 98.2 F (36.8 C), temperature source Oral, resp. rate (!) 22, height 5\' 7"  (1.702 m), weight 63.7 kg, SpO2 98 %.  Labs: Lab Results  Component Value Date   WBC 10.8 (H) 02/10/2018   HGB 8.8 (L) 02/10/2018   HCT 26.5 (L) 02/10/2018   MCV 92.7 02/10/2018   PLT 381 02/10/2018    Recent Labs  Lab 02/08/18 0645  02/10/18 0441  NA 131*   < > 128*  K 3.7   < > 3.6  CL 85*   < > 86*  CO2 35*   < > 32  BUN 11   < > 15  CREATININE 0.90   < > 1.04  CALCIUM 9.5   < >  9.3  PROT 6.9  --   --   BILITOT 0.5  --   --   ALKPHOS 80  --   --   ALT 13  --   --   AST 18  --   --   GLUCOSE 98   < > 117*   < > = values in this interval not displayed.   Lab Results  Component Value Date   CHOL 149 01/03/2018   HDL 42 01/03/2018   LDLCALC 98 01/03/2018   TRIG 43 01/03/2018   BNP (last 3 results) Recent Labs    12/30/17 0858 01/28/18 0542 02/03/18 0045  BNP 4,087.0* 235.0* 549.1*    ProBNP (last 3 results) No results for input(s): PROBNP in the last 8760 hours.   Diagnostic Studies/Procedures   RHC 01/24/18: RHC Procedural Findings (on milrinone 0.25): Hemodynamics (mmHg) RA mean 2 RV 40/7 PA 36/17, mean 24 PCWP mean 17 Oxygen saturations: PA 63% AO 95% Cardiac Output (Fick) 4.21  Cardiac Index (Fick) 2.41 PVR 1.7 WU Cardiac Output (Thermo) 4.92 Cardiac Index (Fick) 2.82  PAPI 4.5  TEE 01/27/18:  Septum: No Patent Foramen Ovale presentNegative bubble study .  Left atrium: Patent foramen ovale not present.  Left atrium: Cavity is dilated and dilated.  Aortic valve: The valve is trileaflet. Mild valve thickening present. No stenosis. Trace regurgitation.  Mitral valve: Non-specific thickening. Severe regurgitation.  Right ventricle: Normal cavity size, wall thickness and ejection fraction.  Tricuspid valve: Trace regurgitation.  Ramp echo 02/05/18: - Left ventricle: Systolic function was severely reduced. The   estimated ejection fraction was in the range of 15% to 20%. - Aortic valve: There was mild regurgitation. - Tricuspid valve: There was mild-moderate regurgitation.  RHC 02/05/18 at 5600 rpm RA 14 RV 36/14 PA 40/14, mean 25 PCWP mean 13 Oxygen saturations: PA 57% AO 97% Cardiac Output (Fick) 5.4  Cardiac Index (Fick) 3.1 PVR 2.2 WU Cardiac Output (Thermo) 5.8 Cardiac Index (Thermo) 3.3  Discharge Medications   Allergies as of 02/10/2018      Reactions   Norflex [orphenadrine] Swelling   FACE  [ANGIOEDEMA]      Medication List    STOP taking these medications   digoxin 0.125 MG tablet Commonly known as:  LANOXIN   furosemide 80 MG tablet Commonly known as:  LASIX   milrinone 1 MG/ML injection Commonly known as:  PRIMACOR   milrinone 20 MG/100 ML Soln infusion Commonly known as:  PRIMACOR   TYLENOL PM EXTRA STRENGTH 25-500 MG Tabs tablet Generic drug:  diphenhydramine-acetaminophen     TAKE these medications   acetaminophen 500 MG tablet Commonly known as:  TYLENOL Take 500 mg by mouth every 6 (six) hours  as needed for mild pain or fever.   albuterol 108 (90 Base) MCG/ACT inhaler Commonly known as:  PROVENTIL HFA;VENTOLIN HFA Inhale 2 puffs into the lungs every 6 (six) hours as needed for wheezing or shortness of breath.   amiodarone 200 MG tablet Commonly known as:  PACERONE Take 1 tablet (200 mg total) by mouth daily. What changed:  when to take this   aspirin EC 81 MG tablet Take 1 tablet (81 mg total) by mouth daily.   docusate sodium 100 MG capsule Commonly known as:  COLACE Take 2 capsules (200 mg total) by mouth daily. Hold for diarrhea   feeding supplement (ENSURE ENLIVE) Liqd Take 237 mLs by mouth 2 (two) times daily between meals.   gabapentin 400 MG capsule Commonly known as:  NEURONTIN Take 1 capsule (400 mg total) by mouth 2 (two) times daily.   losartan 25 MG tablet Commonly known as:  COZAAR Take 1 tablet (25 mg total) by mouth daily.   nicotine 14 mg/24hr patch Commonly known as:  NICODERM CQ - dosed in mg/24 hours Place 14 mg onto the skin daily.   omeprazole 20 MG capsule Commonly known as:  PRILOSEC Take 20 mg by mouth daily.   oxyCODONE 5 MG immediate release tablet Commonly known as:  Oxy IR/ROXICODONE Take 1-2 tablets (5-10 mg total) by mouth every 6 (six) hours as needed for up to 7 days for severe pain.   polyethylene glycol packet Commonly known as:  MIRALAX / GLYCOLAX Take 17 g by mouth daily as needed for severe  constipation.   potassium chloride SA 20 MEQ tablet Commonly known as:  K-DUR,KLOR-CON Take 2 tablets (40 mEq total) by mouth daily.   sildenafil 20 MG tablet Commonly known as:  REVATIO Take 1 tablet (20 mg total) by mouth 3 (three) times daily.   spironolactone 25 MG tablet Commonly known as:  ALDACTONE Take 0.5 tablets (12.5 mg total) by mouth daily.   torsemide 20 MG tablet Commonly known as:  DEMADEX Take 2 tablets (40 mg total) by mouth daily.   traMADol 50 MG tablet Commonly known as:  ULTRAM Take 1-2 tablets (50-100 mg total) by mouth every 6 (six) hours as needed for up to 7 days for moderate pain.   warfarin 3 MG tablet Commonly known as:  COUMADIN Take 1 tablet (3 mg total) by mouth daily.            Durable Medical Equipment  (From admission, onward)         Start     Ordered   02/10/18 1025  Heart failure home health orders  (Heart failure home health orders / Face to face)  Once    Comments:  Heart Failure Follow-up Care:  Verify follow-up appointments per Patient Discharge Instructions. Confirm transportation arranged. Reconcile home medications with discharge medication list. Remove discontinued medications from use. Assist patient/caregiver to manage medications using pill box. Reinforce low sodium food selection Assessments: Vital signs and oxygen saturation at each visit. Assess home environment for safety concerns, caregiver support and availability of low-sodium foods. Consult Child psychotherapist, PT/OT, Dietitian, and CNA based on assessments. Perform comprehensive cardiopulmonary assessment. Notify MD for any change in condition or weight gain of 3 pounds in one day or 5 pounds in one week with symptoms. Daily Weights and Symptom Monitoring: Ensure patient has access to scales. Teach patient/caregiver to weigh daily before breakfast and after voiding using same scale and record.    Teach patient/caregiver to track weight  and symptoms and when to  notify Provider. Activity: Develop individualized activity plan with patient/caregiver.  PT 2 wk 2 for CP Rehab.  Question Answer Comment  Heart Failure Follow-up Care [facility] ([facility] at [phone]   Obtain the following labs Other see comments   Lab frequency Other see comments   Fax lab results to AHF Clinic at [phone]   Diet Low Sodium Heart Healthy   Fluid restrictions: 2000 mL Fluid   Consult: Social work   Initiate Heart Failure Clinic Diuretic Protocol to be used by [facility] only ( to be ordered by Heart Failure Team Providers Only) No      02/10/18 1025   02/09/18 1515  For home use only DME 3 n 1  Once    Comments:  F2F   02/09/18 1514          Disposition   The patient will be discharged in stable condition to home. Discharge Instructions    (HEART FAILURE PATIENTS) Call MD:  Anytime you have any of the following symptoms: 1) 3 pound weight gain in 24 hours or 5 pounds in 1 week 2) shortness of breath, with or without a dry hacking cough 3) swelling in the hands, feet or stomach 4) if you have to sleep on extra pillows at night in order to breathe.   Complete by:  As directed    Call MD for:  difficulty breathing, headache or visual disturbances   Complete by:  As directed    Call MD for:  redness, tenderness, or signs of infection (pain, swelling, redness, odor or green/yellow discharge around incision site)   Complete by:  As directed    Call MD for:  severe uncontrolled pain   Complete by:  As directed    Call MD for:  temperature >100.4   Complete by:  As directed    Diet - low sodium heart healthy   Complete by:  As directed    Heart Failure patients record your daily weight using the same scale at the same time of day   Complete by:  As directed    INR  Goal: 2 - 2.5   Complete by:  As directed    Goal:  2 - 2.5   Increase activity slowly   Complete by:  As directed    Page VAD Coordinator at [phone]   Notify for: any VAD alarms, sustained elevations of power >10 watts, sustained drop in Pulse Index <3   Complete by:  As directed    Notify for:   any VAD alarms sustained elevations of power >10 watts sustained drop in Pulse Index <3     STOP any activity that causes chest pain, shortness of breath, dizziness, sweating, or exessive weakness   Complete by:  As directed    Speed Settings:   Complete by:  As directed    Fixed 5700 RPM Low 5400 RPM     Follow-up Information    Call Charlynne Pander, DDS.   Specialty:  Dentistry Why:  Call for evaluation of healing and suture removal in 7 to 10 days Contact information: [address] [place] [address] [phone]        [facility] - Dme Follow up.   Why:  Rollator; 3n1 bedside commode Contact information: [address] [place] [address] [phone]        [facility]-Home Follow up.   Specialty:  Home  Health Services Why:  Registered Nurse, Physical Therapy Contact information: [address] [place] [address] [phone]        Laurey Morale, MD Follow up on 02/14/2018.   Specialty:  Cardiology Why:  12 pm. VAD follow up. Garage code [address] information: [address] [place] [address] [phone]             Duration of Discharge Encounter: Greater than 35 minutes   Signed, Alford Highland  02/10/2018, 11:03 AM

## 2018-02-09 NOTE — Progress Notes (Signed)
Patient ID: Carl Ferguson, male   DOB: 15-Apr-1966, 52 y.o.   MRN: [id number]     Advanced Heart Failure Rounding Note  PCP-Cardiologist: Garwin Brothers, MD   Subjective:    Events 10/11 RHC - Stable on milrinone 0.25 10/14 HM3 implantation; 2 units FFP. CTs and pressors in place out of OR.  10/14 pm. Speed reduced to 5600 after large UOP and many PI events.  10/15 spiked fever up to 103.5. Started on vanc/unasyn. Cxs sent. 02/05/18 underwent ramp with RHC. Speed to 5700.  Feels good, walking halls without dyspnea.  CVP 8-9, co-ox 55%.    Good UOP with oral torsemide.   INR > 2, off heparin.  LDH 249.  Na remains low at 127.   LVAD Interrogation HM 3: Speed: 5700 Flow: 5.3 PI: 3.1 Power: 4.4. 8-9 PI events. VAD interrogated personally. Parameters stable.   Objective:   Weight Range: 64 kg Body mass index is 22.1 kg/m.   Vital Signs:   Temp:  [97.7 F (36.5 C)-99.1 F (37.3 C)] 98.3 F (36.8 C) (10/27 1146) Pulse Rate:  [56-101] 90 (10/27 1146) Resp:  [13-23] 19 (10/27 1146) BP: (83-103)/(65-82) 91/68 (10/27 1146) SpO2:  [95 %-100 %] 95 % (10/27 1146) Weight:  [64 kg] 64 kg (10/27 0306) Last BM Date: 02/06/18  Weight change: Filed Weights   02/07/18 0355 02/08/18 0349 02/09/18 0306  Weight: 64.5 kg 63.3 kg 64 kg   Intake/Output:   Intake/Output Summary (Last 24 hours) at 02/09/2018 1151 Last data filed at 02/09/2018 1144 Gross per 24 hour  Intake 1200 ml  Output 3300 ml  Net -2100 ml    Physical Exam    MAPs 70s-80s   General: Well appearing this am. NAD.  HEENT: Normal. Neck: Supple, JVP 8-9 cm. Carotids OK.  Cardiac:  Mechanical heart sounds with LVAD hum present.  Lungs:  CTAB, normal effort.  Abdomen:  NT, ND, no HSM. No bruits or masses. +BS  LVAD exit site: Well-healed and incorporated. Dressing dry and intact. No erythema or drainage. Stabilization device present and accurately applied. Driveline dressing changed daily per sterile  technique. Extremities:  Warm and dry. No cyanosis, clubbing, rash, or edema.  Neuro:  Alert & oriented x 3. Cranial nerves grossly intact. Moves all 4 extremities w/o difficulty. Affect pleasant      Telemetry   NSR with his pacing in 90s, Personally reviewed  Labs    CBC Recent Labs    02/08/18 0645 02/09/18 0350  WBC 13.0* 12.6*  HGB 8.7* 8.5*  HCT 26.5* 25.9*  MCV 94.0 92.8  PLT 374 368   Basic Metabolic Panel Recent Labs    40/98/11 0645 02/09/18 0350  NA 131* 127*  K 3.7 3.1*  CL 85* 79*  CO2 35* 32  GLUCOSE 98 173*  BUN 11 16  CREATININE 0.90 1.09  CALCIUM 9.5 9.5   Liver Function Tests Recent Labs    02/07/18 0405 02/08/18 0645  AST 19 18  ALT 13 13  ALKPHOS 64 80  BILITOT 0.8 0.5  PROT 6.2* 6.9  ALBUMIN 3.0* 3.1*   No results for input(s): LIPASE, AMYLASE in the last 72 hours. Cardiac Enzymes No results for input(s): CKTOTAL, CKMB, CKMBINDEX, TROPONINI in the last 72 hours.  BNP: BNP (last 3 results) Recent Labs    12/30/17 0858 01/28/18 0542 02/03/18 0045  BNP 4,087.0* 235.0* 549.1*    ProBNP (last 3 results) No results for input(s): PROBNP in the last 8760 hours.  D-Dimer No results for input(s): DDIMER in the last 72 hours. Hemoglobin A1C No results for input(s): HGBA1C in the last 72 hours. Fasting Lipid Panel No results for input(s): CHOL, HDL, LDLCALC, TRIG, CHOLHDL, LDLDIRECT in the last 72 hours. Thyroid Function Tests No results for input(s): TSH, T4TOTAL, T3FREE, THYROIDAB in the last 72 hours.  Invalid input(s): FREET3  Other results:   Imaging    No results found.   Medications:     Scheduled Medications: . amiodarone  200 mg Oral BID  . aspirin EC  81 mg Oral Daily  . bisacodyl  10 mg Oral Daily   Or  . bisacodyl  10 mg Rectal Daily  . Chlorhexidine Gluconate Cloth  6 each Topical Daily  . docusate sodium  200 mg Oral Daily  . feeding supplement (ENSURE ENLIVE)  237 mL Oral BID BM  . gabapentin   400 mg Oral BID  . insulin aspart  0-24 Units Subcutaneous TID AC & HS  . losartan  25 mg Oral Daily  . mouth rinse  15 mL Mouth Rinse BID  . pantoprazole  40 mg Oral Daily  . potassium chloride  40 mEq Oral Daily  . potassium chloride  40 mEq Oral Once  . sildenafil  20 mg Oral TID  . sodium chloride flush  10-40 mL Intracatheter Q12H  . sodium chloride flush  3 mL Intravenous Q12H  . sodium chloride flush  3 mL Intravenous Q12H  . spironolactone  12.5 mg Oral Daily  . torsemide  80 mg Oral Daily  . warfarin  4 mg Oral ONCE-1800  . Warfarin - Pharmacist Dosing Inpatient   Does not apply q1800    Infusions: . sodium chloride Stopped (02/03/18 1007)  . sodium chloride      PRN Medications: sodium chloride, sodium chloride, acetaminophen, hydrALAZINE, lip balm, ondansetron (ZOFRAN) IV, oxyCODONE, sodium chloride flush, traMADol  Assessment/Plan   1. Chronic Systolic CHF: S/p HM3 01/27/18.  Nonischemic cardiomyopathy, diagnosed in 2011, initially followed at [facility].  LHC (3/19) with no significant CAD.  CPX (3/19) submaximal, but suggestive of mild to moderate HF limitation. HIV negative 3/19, history of drug/ETOH abuse in the past, no drugs now and had cut back ETOH considerably.  Echo 9/19 showed EF 20% with severe LV dilation, severe MR.  NYHA class IV symptoms. He has a wide LBBB.  RHC was done in 9/19 showing low output, CI 1.4.  He was admitted to start milrinone, unable to wean off milrinone and remained on 0.25 mcg/kg/min at home.  Medtronic ICD was placed, he has a His bundle lead but EP was unable to get an LV lead in place => His bundle pacing does not narrow his QRS and did not help [place] wean him off milrinone.  HM3 placed 10/14. Speed increased to 5700 02/05/18 with echo + RHC LVAD optimization. Co-ox 55%. CVP 8-9 cm. Stable LDH.  - Can drop torsemide down to 60 mg daily.  - Continue losartan 25 mg daily - Continue spiro 12.5 mg daily.  - Continue warfarin with  therapeutic INR.  Now on ASA 81. 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 2. ID: Treated with Augmentin for dental infection pre-op.  POD#1 had fever of 103.5. Started on Vanc/Unasyn.  Afebrile now. - Cultures NGTD and off antibiotics.  He has had episodes of loose stool. - C diff negative.  3. ETOH Abuse: No longer drinking. No change.  4. Tobacco abuse:  He has quit smoking since admission in 9/19.  - Continue to encourage complete cessation. No change.  5. Mitral regurgitation: Severe functional MR.  With LV dilated > 7 cm, suspect Mitraclip would be unlikely to help him appreciably.  Decreased MR s/p LVAD placement.  No change.  6. VT: Patient had episode of VT requiring ATP prior to admission. Had VT for around 5 minutes 01/29/18.   - Continue po amiodarone, can decrease to daily.   7. Hypokalemia:  - Supplement today.  8. Hyponatremia:  - Na 127, continue to fluid restrict.  Hopefully d/c Monday with temporary housing.   Marca Ancona, MD  02/09/2018 11:51 AM   VAD Team Pager [phone] (7am - 7am) ++VAD ISSUES ONLY++   Advanced Heart Failure Team Pager [phone] (M-F; 7a - 4p)  Please contact [facility] for night-coverage after hours (4p -7a ) and weekends on [email or web address]

## 2018-02-09 NOTE — Progress Notes (Signed)
Md notified of potassium 3.1 this am.  Carl Ferguson

## 2018-02-09 NOTE — Progress Notes (Signed)
Drive Line:  Existing VAD dressing removed and site care performed using sterile technique. Drive line exit site cleaned with Chlora prep applicators x 2, allowed to dry, and gauze dressing with silver strip re-applied. 2 sutures intact. Exit site healing and unincorporated, the velour is fully implanted at exit site. No redness, tenderness, drainage, foul odor or rash noted. Drive line anchor re-applied.

## 2018-02-09 NOTE — Progress Notes (Signed)
ANTICOAGULATION CONSULT NOTE - Follow Up Consult  Pharmacy Consult for Coumadin Indication: LVAD  Allergies  Allergen Reactions  . Norflex [Orphenadrine] Swelling    FACE [ANGIOEDEMA]    Patient Measurements: Height: 5\' 7"  (170.2 cm) Weight: 141 lb 1.5 oz (64 kg) IBW/kg (Calculated) : 66.1 Heparin Dosing Weight: 65.9 kg  Vital Signs: Temp: 98.2 F (36.8 C) (10/27 0745) Temp Source: Oral (10/27 0745) BP: 103/82 (10/27 0745) Pulse Rate: 56 (10/27 0745)  Labs: Recent Labs    02/07/18 0405 02/08/18 0645 02/09/18 0350  HGB 8.2* 8.7* 8.5*  HCT 24.5* 26.5* 25.9*  PLT 312 374 368  LABPROT 25.9* 24.8* 24.3*  INR 2.41 2.28 2.22  CREATININE 0.83 0.90 1.09    Estimated Creatinine Clearance: 72.6 mL/min (by C-G formula based on SCr of 1.09 mg/dL).  Assessment: 52 yr old male s/p HM3 on 10/14. Coumadin held on 10/18 after significant jump in INR 1.3>2.24.  INR at goal 2.22, Hgb stable 8.5. No signs/symptoms of bleeding. LDH stable.    Goal of Therapy:  INR 2-2.5 Monitor platelets by anticoagulation protocol: Yes   Plan:  -Warfarin 4mg  tonight - Daily heparin level, PT/INR and CBC with am labs.  Sheppard Coil PharmD., BCPS Clinical Pharmacist 02/09/2018 10:03 AM

## 2018-02-09 NOTE — Progress Notes (Signed)
Patient and caregiver have found a temporary solution to housing for one week post-discharge.  Housing information placed in patient's shadow chart for CSW and CM follow-up.  Caregiver continues to seek more permanent housing in [place], [place].

## 2018-02-09 NOTE — Progress Notes (Signed)
4 Days Post-Op Procedure(s) (LRB): RIGHT HEART CATH (N/A) Subjective: Patient continues to do well now 12 days postop implantation LVAD HeartMate 3 Surgical incisions clean and dry Chest x-ray clear VAD parameters satisfactory at current speed setting Patient ready for discharge tomorrow Benzoin swabs provided patient to help secure the power cord stabilizer to the skin Discharge instructions regarding wound care, activity limitations, and optimal diet reviewed with patient.  Objective: Vital signs in last 24 hours: Temp:  [97.7 F (36.5 C)-98.5 F (36.9 C)] 98.3 F (36.8 C) (10/27 1146) Pulse Rate:  [56-101] 90 (10/27 1146) Cardiac Rhythm: Ventricular paced (10/27 0745) Resp:  [13-23] 19 (10/27 1146) BP: (89-103)/(65-82) 91/68 (10/27 1146) SpO2:  [95 %-100 %] 95 % (10/27 1146) Weight:  [64 kg] 64 kg (10/27 0306)  Hemodynamic parameters for last 24 hours: CVP:  [5 mmHg-15 mmHg] 8 mmHg  Intake/Output from previous day: 10/26 0701 - 10/27 0700 In: 1320 [P.O.:1320] Out: 3400 [Urine:3400] Intake/Output this shift: Total I/O In: 480 [P.O.:480] Out: 900 [Urine:900]  Alert and very positive about leaving hospital alive Lungs clear Normal VAD hum No edema Power cord exit site dressing clean and dry   Lab Results: Recent Labs    02/08/18 0645 02/09/18 0350  WBC 13.0* 12.6*  HGB 8.7* 8.5*  HCT 26.5* 25.9*  PLT 374 368   BMET:  Recent Labs    02/08/18 0645 02/09/18 0350  NA 131* 127*  K 3.7 3.1*  CL 85* 79*  CO2 35* 32  GLUCOSE 98 173*  BUN 11 16  CREATININE 0.90 1.09  CALCIUM 9.5 9.5    PT/INR:  Recent Labs    02/09/18 0350  LABPROT 24.3*  INR 2.22   ABG    Component Value Date/Time   PHART 7.535 (H) 01/30/2018 0530   HCO3 31.0 (H) 02/05/2018 0943   HCO3 29.1 (H) 02/05/2018 0943   TCO2 32 02/05/2018 0943   TCO2 30 02/05/2018 0943   ACIDBASEDEF 2.0 01/28/2018 1738   O2SAT 55.4 02/09/2018 0400   CBG (last 3)  Recent Labs    02/08/18 2212  02/09/18 0825 02/09/18 1223  GLUCAP 133* 107* 120*    Assessment/Plan: S/P Procedure(s) (LRB): RIGHT HEART CATH (N/A) Ready for discharge tomorrow   LOS: 21 days    Kathlee Nations Trigt III 02/09/2018

## 2018-02-09 NOTE — Plan of Care (Signed)
Patient is slowly progressing toward care goals.  Today, patient states he just wants to stay in bed.  Patient requests pain med approx q 3 hrs, alternating Oxycodone and Tramadol.  Patient encouraged to walk but declines.  Will continue to monitor, encouraging self care w/new LVAD and caregiver education.

## 2018-02-10 LAB — CBC
HCT: 26.5 % — ABNORMAL LOW (ref 39.0–52.0)
HEMOGLOBIN: 8.8 g/dL — AB (ref 13.0–17.0)
MCH: 30.8 pg (ref 26.0–34.0)
MCHC: 33.2 g/dL (ref 30.0–36.0)
MCV: 92.7 fL (ref 80.0–100.0)
Platelets: 381 10*3/uL (ref 150–400)
RBC: 2.86 MIL/uL — AB (ref 4.22–5.81)
RDW: 13.9 % (ref 11.5–15.5)
WBC: 10.8 10*3/uL — ABNORMAL HIGH (ref 4.0–10.5)
nRBC: 0 % (ref 0.0–0.2)

## 2018-02-10 LAB — GLUCOSE, CAPILLARY
GLUCOSE-CAPILLARY: 107 mg/dL — AB (ref 70–99)
GLUCOSE-CAPILLARY: 112 mg/dL — AB (ref 70–99)
GLUCOSE-CAPILLARY: 150 mg/dL — AB (ref 70–99)

## 2018-02-10 LAB — BASIC METABOLIC PANEL
ANION GAP: 10 (ref 5–15)
BUN: 15 mg/dL (ref 6–20)
CHLORIDE: 86 mmol/L — AB (ref 98–111)
CO2: 32 mmol/L (ref 22–32)
Calcium: 9.3 mg/dL (ref 8.9–10.3)
Creatinine, Ser: 1.04 mg/dL (ref 0.61–1.24)
GFR calc non Af Amer: >60 mL/min (ref >60)
Glucose, Bld: 117 mg/dL — ABNORMAL HIGH (ref 70–99)
POTASSIUM: 3.6 mmol/L (ref 3.5–5.1)
Sodium: 128 mmol/L — ABNORMAL LOW (ref 135–145)

## 2018-02-10 LAB — PROTIME-INR
INR: 2.81
PROTHROMBIN TIME: 29.2 s — AB (ref 11.4–15.2)

## 2018-02-10 LAB — COOXEMETRY PANEL
CARBOXYHEMOGLOBIN: 2.1 % — AB (ref 0.5–1.5)
METHEMOGLOBIN: 1.7 % — AB (ref 0.0–1.5)
O2 SAT: 73.6 %
TOTAL HEMOGLOBIN: 8.5 g/dL — AB (ref 12.0–16.0)

## 2018-02-10 LAB — LACTATE DEHYDROGENASE: LDH: 246 U/L — AB (ref 98–192)

## 2018-02-10 MED ORDER — TORSEMIDE 20 MG PO TABS
40.0000 mg | ORAL_TABLET | Freq: Every day | ORAL | Status: DC
  Administered 2018-02-10: 40 mg via ORAL
  Filled 2018-02-10: qty 2

## 2018-02-10 MED ORDER — ENSURE ENLIVE PO LIQD: 237.0000 mL | Freq: Two times a day (BID) | ORAL | 12 refills | Status: DC

## 2018-02-10 MED ORDER — TORSEMIDE 20 MG PO TABS: 40.0000 mg | ORAL_TABLET | Freq: Every day | ORAL | 0 refills | Status: DC

## 2018-02-10 MED ORDER — POLYETHYLENE GLYCOL 3350 17 G PO PACK: 17.0000 g | PACK | Freq: Every day | ORAL | 0 refills | Status: DC | PRN

## 2018-02-10 MED ORDER — WARFARIN SODIUM 2 MG PO TABS: 2.0000 mg | ORAL_TABLET | Freq: Once | ORAL | Status: DC

## 2018-02-10 MED ORDER — SILDENAFIL CITRATE 20 MG PO TABS: 20.0000 mg | ORAL_TABLET | Freq: Three times a day (TID) | ORAL | 0 refills | Status: DC

## 2018-02-10 MED ORDER — WARFARIN SODIUM 3 MG PO TABS: 3.0000 mg | ORAL_TABLET | Freq: Every day | ORAL | 11 refills | Status: DC

## 2018-02-10 MED ORDER — AMIODARONE HCL 200 MG PO TABS: 200.0000 mg | ORAL_TABLET | Freq: Every day | ORAL | 0 refills | Status: DC

## 2018-02-10 MED ORDER — DOCUSATE SODIUM 100 MG PO CAPS: 200.0000 mg | ORAL_CAPSULE | Freq: Every day | ORAL | 0 refills | Status: DC

## 2018-02-10 MED ORDER — WARFARIN SODIUM 3 MG PO TABS: 3.0000 mg | ORAL_TABLET | Freq: Once | ORAL | Status: DC

## 2018-02-10 MED ORDER — LOSARTAN POTASSIUM 25 MG PO TABS: 25.0000 mg | ORAL_TABLET | Freq: Every day | ORAL | 0 refills | Status: DC

## 2018-02-10 MED ORDER — TRAMADOL HCL 50 MG PO TABS: 50.0000 mg | ORAL_TABLET | Freq: Four times a day (QID) | ORAL | 0 refills | Status: DC | PRN

## 2018-02-10 MED ORDER — OXYCODONE HCL 5 MG PO TABS: 5.0000 mg | ORAL_TABLET | Freq: Four times a day (QID) | ORAL | 0 refills | Status: AC | PRN

## 2018-02-10 MED ORDER — GABAPENTIN 400 MG PO CAPS: 400.0000 mg | ORAL_CAPSULE | Freq: Two times a day (BID) | ORAL | 0 refills | Status: DC

## 2018-02-10 MED FILL — AMIODARONE HCL 200 MG TAB: 200 | 30 days supply | Qty: 30 | Fill #0

## 2018-02-10 MED FILL — LOSARTAN POTASSIUM 25 MG TA: 25 | 30 days supply | Qty: 30 | Fill #0

## 2018-02-10 MED FILL — SILDENAFIL CITRATE 20 MG TA: 20 | 30 days supply | Qty: 90 | Fill #0

## 2018-02-10 MED FILL — WARFARIN SODIUM 3 MG TABLET: 3 | 30 days supply | Qty: 30 | Fill #0

## 2018-02-10 MED FILL — TORSEMIDE 20 MG TABLET: 20 | 15 days supply | Qty: 30 | Fill #0

## 2018-02-10 MED FILL — GABAPENTIN 400 MG CAPSULE: 400 | 30 days supply | Qty: 60 | Fill #0

## 2018-02-10 NOTE — Progress Notes (Signed)
PICC line removed per order. Vaseline gauze and gauze dressing applied.  Dressing clean dry and intact.  Patient verbalizes understanding to not get dressing wet and leave on for 24 hours.  Patient also verbalizes he can get out of bed again after 30 minutes at 11:37am.

## 2018-02-10 NOTE — Progress Notes (Addendum)
ANTICOAGULATION CONSULT NOTE - Follow Up Consult  Pharmacy Consult for Coumadin Indication: LVAD  Allergies  Allergen Reactions  . Norflex [Orphenadrine] Swelling    FACE [ANGIOEDEMA]    Patient Measurements: Height: 5\' 7"  (170.2 cm) Weight: 140 lb 6.9 oz (63.7 kg) IBW/kg (Calculated) : 66.1 Heparin Dosing Weight: 65.9 kg  Vital Signs: Temp: 97.4 F (36.3 C) (10/28 0228) Temp Source: Oral (10/28 0228) BP: 96/60 (10/28 0228) Pulse Rate: 84 (10/28 0300)  Labs: Recent Labs    02/08/18 0645 02/09/18 0350 02/10/18 0441  HGB 8.7* 8.5* 8.8*  HCT 26.5* 25.9* 26.5*  PLT 374 368 381  LABPROT 24.8* 24.3* 29.2*  INR 2.28 2.22 2.81  CREATININE 0.90 1.09 1.04    Estimated Creatinine Clearance: 75.7 mL/min (by C-G formula based on SCr of 1.04 mg/dL).  Assessment: 52 yr old male s/p HM3 on 10/14. Coumadin held on 10/18 after significant jump in INR 1.3>2.24.  INR slightly above goal at 2.81, Hgb stable 8.8. No signs/symptoms of bleeding. LDH stable.  VAD clinic appointment scheduled Friday.   Goal of Therapy:  INR 2-2.5 Monitor platelets by anticoagulation protocol: Yes   Plan:  - Warfarin 2mg  today prior to DC.  - Discharge home with plans for warfarin 3 mg daily until INR check Friday.      Marcelino Freestone, PharmD PGY2 Cardiology Pharmacy Resident Phone [phone] Please check AMION for all Pharmacist numbers by unit 02/10/2018 7:15 AM

## 2018-02-10 NOTE — Care Management Note (Addendum)
Case Management Note Previous note created by Midge Minium  Patient Details  Name: Carl Ferguson MRN: [id number] Date of Birth: October 31, 1965  Subjective/Objective:   Pt admitted for dental extraction - pt is on home milrninone                 Action/Plan:  PTA from home on IV milrinone with AHC - has HHRN.  Pt readmit and has recently utilized [facility]   Expected Discharge Date:                 Expected Discharge Plan:  [place]  In-House Referral:  NA  Discharge planning Services  CM Consult  Post Acute Care Choice:  Home Health, Durable Medical Equipment Choice offered to:  Patient  DME Arranged:  Walker rolling with seat, Bedside commode DME Agency:  [place]:  RN, Disease Management, PT [place] Agency:  [place]  Status of Service:  In process, will continue to follow  If discussed at Long Length of Stay Meetings, dates discussed:    Additional Comments: 02/10/2018  Pt to discharge home today - pt is now active with the free clinic of [facility] for PCP.  Pts significant other will provide 24/7 supervision.  [facility] will deliver medications to bedside at discharge.  CM reiterated the importance of daily weights and low sodium diet.  HF team aware that pt does not have prescription insurance nor PCP  02/07/18 HF team will fund discharge medications.  CM will continue to follow for discharge needs  01/31/18 @ 1539-Natalie Gay RNCM-CM met with patient to discuss transitional needs and PT recommendations for HHPT/DME. Patient was open to [facility] PTA, and would like to resume post discharge. Patient will need orders for [facility] services, rollator, 3n1 BSC with F2F. [place] referral will be given to [facility] liaison; AVS updated. CM will continue to follow along with VAD team for transitional needs.   01/28/2018 @ 1602-Natalie Gay RNCM-CM consult acknowledged for VAD patient. S/p LVAD placement on 10/14 with CM to follow to assist with  dispositional needs.   01/21/2018 Plan is for pt to have Swan placed 01/24/18. Tentative LVAD planned for early next week  [place], BSN, NCM-BC, ACM-RN [phone] 02/10/2018, 12:48 PM

## 2018-02-10 NOTE — Progress Notes (Addendum)
LVAD Coordinator Rounding Note:  Admitted 01/18/18 due to fever and chills. Started on broad spectrum antibiotics. Teeth extractions done by Dr. Kristin Bruins 01/20/18. RHC done 01/24/18.   HM III LVAD implanted on 01/27/18 by Dr. Donata Clay under Destination Therapy criteria.  Pt sitting on side of bed; caregiver at bedside. Pt anxious to go home.   Vital signs: Temp: 98.2 HR:  88 Cuff BP:  107/66 Doppler:  92 O2 Sat: 98% on RA Wt: 137.5>158.7>149.9>147.2>147.9>144.9>143.7>140.8>144.3>141.9>140 lbs  LVAD interrogation reveals:  Speed: 5700 Flow: 5.3 Power: 4.1w PI: 3.1 Alarms: none Events: 30 PI events 02/09/18  Hematocrit: 26  Fixed speed: 5700 Low speed limit: 5300  Drive Line:  Left lower abdominal dressing clean, dry, intact. Dressing change due today - pt wants to have Claris Che change at home after discharge.    Labs:  LDH trend: 282>324>263>262>472>263>239>244>243>259>249>246  INR trend: 1.26>1.33>1.31>1.30>2.0>2.71>1.37>1.30>1.75>2.41>2.28>2.22>2.81  WBC trend: 8.6>15.6>16.3>14.5>10.4>11.1>14.8>12.5>13.3>12.2>13.0>12.6>10.8  Anticoagulation Plan: -INR Goal:  2.0 - 2.5 -ASA Dose: ASA 81 mg daily  Blood Products:  -Intraop: 4 FFP; 450 cell saver   - Post op:  01/27/18>>1 PRBC  Device: - Medtronic ICD - Pacing: DDD 60 - Therapies: turned on 02/04/18  Drips: Milrinone 0.30mcg/kg/min--off 01/30/18 Amiodarone 30mg /hr--restarted 01/30/18--transitioned to PO 01/31/18 Lasix 8mg /hr--off Heparin--started 01/30/18--off 01/31/18  Arrythmias: 01/29/18- VT for roughly 5 minutes.  Amio bolus given.  Respiratory: extubated 01/28/18   Infection: Blood/urine/sputum cultures ordered due to fever 101.3.  - 01/28/18 BC's>> negative; final -  01/28/18 urine cultures>>negative; final  Adverse Events on VAD: -   VAD Education:  1. Pt/caregiver ready for discharge.  2. One question re: home logbook, reviewed and answered with stated understanding per both.  3. Will  call VAD pager if any questions after discharge - both have VAD pager programmed in phones. 4. Claris Che will change VAD dressing today after discharge. 5. Plan for one week hotel stay until permament home established. 6. F/U scheduled Friday of this week, Claris Che will call and change to Thursday if work schedule dictates change.   [name], VAD Coordinator delivered the remainder of home equipment to patient's room to include the following:  1. Two system controllers. 2. Mobile Power Unit (MPU) with 20' patient cable 3. One universal Magazine features editor (UBC) 4. Eight fully charged batteries  5. Four battery clips 6. One travel case 7. One holster vest 8. 1 Consolidated bag  8. Wearable accessory package 9. Daily dressing kits, anchors, Aquacel (silver dressing)  Pt's temporary address (@ one week) will be the following:  [employer] [facility] [address] [place], [place] [address] [phone]   Plan/Recommendations:  1. Daily dressing changes per VAD RN, nurse champion, or trained caregiver.  2. Call VAD pager with any equipment or drive line questions, or needs.   Hessie Diener RN VAD Coordinator  Office: [phone]  24/7 Pager: [phone]

## 2018-02-10 NOTE — Progress Notes (Signed)
VAD Day of Discharge:  Discharge equipment includes: 1. Two system controllers. 2. Mobile Power Unit (MPU) with 20' patient cable 3. One universal Magazine features editor (UBC) 4. Eight fully charged batteries  5. Four battery clips 6. One travel case 7. Two holster vest 8. Wearable accessory package 9. 8 daily dressing kits, 8 anchors, Aquacel (silver dressing)   VAD Education:   1. Reviewed importance of having a 24 hour caregiver 2. Reviewed dressing change frequency 3. Reviewed when to call the VAD pager and made sure they have phone number in their phone.  4. Reviewed importance of changing one power source at a time 5. Reviewed importance of carrying black emergency bag containing backup controller, 2 batteries, and 2 battery clips, everywhere 6. Reviewed importance of placing mobile power unit (MPU) and batteries on bedside table with a flashlight. Talked about what to do in case of power failure. Reminded to make sure the outlets that equipment is plugged into are not controlled by a light switch.  7. Reviewed importance of using anchors to hold drive line in place, to prevent accidental pulling, or dislodgement of drive line.  8. Patient and family agreed to pick up prescriptions. Stressed importance of taking Warfarin daily in the evening. Stressed importance of taking all prescribed medications as written 9. First clinic visit bring all medications and VAD log.    [name] RN VAD Coordinator  Office: [phone]  24/7 Pager: [phone]

## 2018-02-10 NOTE — Progress Notes (Addendum)
Patient ID: Carl Ferguson, male   DOB: [birth year], 52 y.o.   MRN: [id number]     Advanced Heart Failure Rounding Note  PCP-Cardiologist: Garwin Brothers, MD   Subjective:    Events 10/11 RHC - Stable on milrinone 0.25 10/14 HM3 implantation; 2 units FFP. CTs and pressors in place out of OR.  10/14 pm. Speed reduced to 5600 after large UOP and many PI events.  10/15 spiked fever up to 103.5. Started on vanc/unasyn. Cxs sent. 02/05/18 underwent ramp with RHC. Speed to 5700.  CVP not connected, co-ox 74%.    Good UOP on PO torsemide. Decreased to 60 mg daily yesterday.   INR 2.8. LDH 246. Na 127 > 128  Feels great. Wants to go home. He has temporary housing in [place] and potentially a permanent housing option. No CP, SOB, or bleeding.  LVAD Interrogation HM 3: Speed: 5700 Flow: 5.6 PI: 2.0 Power: 4.0. 32 PI events/24 hours. VAD interrogated personally. Parameters stable.  Objective:   Weight Range: 63.7 kg Body mass index is 21.99 kg/m.   Vital Signs:   Temp:  [97.4 F (36.3 C)-99.1 F (37.3 C)] 98.2 F (36.8 C) (10/28 0708) Pulse Rate:  [56-96] 88 (10/28 0708) Resp:  [16-30] 22 (10/28 0708) BP: (85-109)/(57-82) 107/66 (10/28 0708) SpO2:  [94 %-98 %] 98 % (10/28 0708) Weight:  [63.7 kg] 63.7 kg (10/28 0228) Last BM Date: 02/09/18  Weight change: Filed Weights   02/08/18 0349 02/09/18 0306 02/10/18 0228  Weight: 63.3 kg 64 kg 63.7 kg   Intake/Output:   Intake/Output Summary (Last 24 hours) at 02/10/2018 0743 Last data filed at 02/10/2018 0600 Gross per 24 hour  Intake 1920 ml  Output 3000 ml  Net -1080 ml    Physical Exam    MAPs 70-80s  General: Well appearing this am. NAD.  HEENT: Normal. Neck: Supple, JVP 5-6 cm. Carotids OK.  Cardiac:  Mechanical heart sounds with LVAD hum present.  Lungs:  CTAB, normal effort.  Abdomen:  NT, ND, no HSM. No bruits or masses. +BS  LVAD exit site: Well-healed and incorporated. Dressing dry and intact. No  erythema or drainage. Stabilization device present and accurately applied. Driveline dressing changed daily per sterile technique. Extremities:  Warm and dry. No cyanosis, clubbing, rash, or edema.  Neuro:  Alert & oriented x 3. Cranial nerves grossly intact. Moves all 4 extremities w/o difficulty. Affect pleasant     Telemetry   NSR with Vpacing 80-90s. Personally reviewed.   Labs    CBC Recent Labs    02/09/18 0350 02/10/18 0441  WBC 12.6* 10.8*  HGB 8.5* 8.8*  HCT 25.9* 26.5*  MCV 92.8 92.7  PLT 368 381   Basic Metabolic Panel Recent Labs    38/88/28 0350 02/10/18 0441  NA 127* 128*  K 3.1* 3.6  CL 79* 86*  CO2 32 32  GLUCOSE 173* 117*  BUN 16 15  CREATININE 1.09 1.04  CALCIUM 9.5 9.3   Liver Function Tests Recent Labs    02/08/18 0645  AST 18  ALT 13  ALKPHOS 80  BILITOT 0.5  PROT 6.9  ALBUMIN 3.1*   No results for input(s): LIPASE, AMYLASE in the last 72 hours. Cardiac Enzymes No results for input(s): CKTOTAL, CKMB, CKMBINDEX, TROPONINI in the last 72 hours.  BNP: BNP (last 3 results) Recent Labs    12/30/17 0858 01/28/18 0542 02/03/18 0045  BNP 4,087.0* 235.0* 549.1*    ProBNP (last 3 results) No results for input(s):  PROBNP in the last 8760 hours.   D-Dimer No results for input(s): DDIMER in the last 72 hours. Hemoglobin A1C No results for input(s): HGBA1C in the last 72 hours. Fasting Lipid Panel No results for input(s): CHOL, HDL, LDLCALC, TRIG, CHOLHDL, LDLDIRECT in the last 72 hours. Thyroid Function Tests No results for input(s): TSH, T4TOTAL, T3FREE, THYROIDAB in the last 72 hours.  Invalid input(s): FREET3  Other results:   Imaging    No results found.   Medications:     Scheduled Medications: . amiodarone  200 mg Oral Daily  . aspirin EC  81 mg Oral Daily  . bisacodyl  10 mg Oral Daily   Or  . bisacodyl  10 mg Rectal Daily  . Chlorhexidine Gluconate Cloth  6 each Topical Daily  . docusate sodium  200 mg Oral  Daily  . feeding supplement (ENSURE ENLIVE)  237 mL Oral BID BM  . gabapentin  400 mg Oral BID  . insulin aspart  0-24 Units Subcutaneous TID AC & HS  . losartan  25 mg Oral Daily  . mouth rinse  15 mL Mouth Rinse BID  . pantoprazole  40 mg Oral Daily  . potassium chloride  40 mEq Oral Daily  . sildenafil  20 mg Oral TID  . sodium chloride flush  10-40 mL Intracatheter Q12H  . sodium chloride flush  3 mL Intravenous Q12H  . sodium chloride flush  3 mL Intravenous Q12H  . spironolactone  12.5 mg Oral Daily  . torsemide  60 mg Oral Daily  . Warfarin - Pharmacist Dosing Inpatient   Does not apply q1800    Infusions: . sodium chloride Stopped (02/03/18 1007)  . sodium chloride      PRN Medications: sodium chloride, sodium chloride, acetaminophen, hydrALAZINE, lip balm, ondansetron (ZOFRAN) IV, oxyCODONE, polyethylene glycol, sodium chloride flush, traMADol  Assessment/Plan   1. Chronic Systolic CHF: S/p HM3 01/27/18.  Nonischemic cardiomyopathy, diagnosed in 2011, initially followed at [place].  LHC (3/19) with no significant CAD.  CPX (3/19) submaximal, but suggestive of mild to moderate HF limitation. HIV negative 3/19, history of drug/ETOH abuse in the past, no drugs now and had cut back ETOH considerably.  Echo 9/19 showed EF 20% with severe LV dilation, severe MR.  NYHA class IV symptoms. He has a wide LBBB.  RHC was done in 9/19 showing low output, CI 1.4.  He was admitted to start milrinone, unable to wean off milrinone and remained on 0.25 mcg/kg/min at home.  Medtronic ICD was placed, he has a His bundle lead but EP was unable to get an LV lead in place => His bundle pacing does not narrow his QRS and did not help [place] wean him off milrinone.  HM3 placed 10/14. Speed increased to 5700 02/05/18 with echo + RHC LVAD optimization. Co-ox 74%. CVP not connected. Stable LDH 246. Frequent PI events yesterday.   - Continue torsemide 60 mg daily.  - Continue losartan 25 mg daily -  Continue spiro 12.5 mg daily.  - Continue warfarin with therapeutic INR.  Now on ASA 81. INR 2.81. Per pharmacy.  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 2. ID: Treated with Augmentin for dental infection pre-op.  POD#1 had fever of 103.5. Started on Vanc/Unasyn. Tmax 99.1 overnight. WBC trending down 10.8 - Cultures NGTD and off antibiotics.  He has had episodes of loose stool. - C diff negative.  3. ETOH Abuse: No longer drinking. No  change.  4. Tobacco abuse: He has quit smoking since admission in 9/19.  - Continue to encourage complete cessation. No change.  5. Mitral regurgitation: Severe functional MR.  With LV dilated > 7 cm, suspect Mitraclip would be unlikely to help him appreciably.  Decreased MR s/p LVAD placement.  No change. 6. VT: Patient had episode of VT requiring ATP prior to admission. Had VT for around 5 minutes 01/29/18.   - Continue po amiodarone 200 mg daily 7. Hypokalemia:  - K 3.6.  8. Hyponatremia:  - Na 127 > 128, continue to fluid restrict.  Probable DC today. Has temporary housing set up.   Carl Highland, NP  02/10/2018 7:43 AM   VAD Team Pager [phone] (7am - 7am) ++VAD ISSUES ONLY++   Advanced Heart Failure Team Pager [phone] (M-F; 7a - 4p)  Please contact [facility] for night-coverage after hours (4p -7a ) and weekends on [email or web address]  Patient seen with NP, agree with the above note.  He feels "great" today.  Co-ox 74%.  Weight down again.  Having more PI events, suspect due to aggressive diuresis.   - Decrease torsemide to 40 mg daily, reassess volume and PI events at followup appt, may need to decrease speed.   He is afebrile with stable hemoglobin.   Plan for home today.  Will need followup later this week in LVAD clinic.  Cardiac meds for home: warfarin, ASA 81, amiodarone 200 daily, losartan 25 daily, KCl 40 daily, torsemide 40 daily, spironolactone 12.5 daily, sildenafil 20 mg tid.    Carl Ferguson 02/10/2018 10:31 AM

## 2018-02-12 ENCOUNTER — Encounter (HOSPITAL_COMMUNITY): Payer: Self-pay | Admitting: *Deleted

## 2018-02-12 ENCOUNTER — Other Ambulatory Visit (HOSPITAL_COMMUNITY): Payer: Self-pay | Admitting: *Deleted

## 2018-02-12 DIAGNOSIS — Z95811 Presence of heart assist device: Secondary | ICD-10-CM

## 2018-02-12 DIAGNOSIS — Z7901 Long term (current) use of anticoagulants: Secondary | ICD-10-CM

## 2018-02-13 ENCOUNTER — Ambulatory Visit (HOSPITAL_COMMUNITY)
Admission: RE | Admit: 2018-02-13 | Discharge: 2018-02-13 | Disposition: A | Discharge status: Home / Self Care | Payer: Medicaid Other | Source: Ambulatory Visit | Attending: Cardiology | Admitting: Cardiology

## 2018-02-13 ENCOUNTER — Encounter (HOSPITAL_COMMUNITY): Payer: Self-pay

## 2018-02-13 ENCOUNTER — Ambulatory Visit (HOSPITAL_COMMUNITY): Payer: Self-pay | Admitting: Pharmacist

## 2018-02-13 VITALS — BP 96/47 | Ht 67.0 in | Wt 153.8 lb

## 2018-02-13 DIAGNOSIS — Z7982 Long term (current) use of aspirin: Secondary | ICD-10-CM | POA: Diagnosis not present

## 2018-02-13 DIAGNOSIS — Z720 Tobacco use: Secondary | ICD-10-CM | POA: Diagnosis not present

## 2018-02-13 DIAGNOSIS — F17200 Nicotine dependence, unspecified, uncomplicated: Secondary | ICD-10-CM | POA: Insufficient documentation

## 2018-02-13 DIAGNOSIS — Z7901 Long term (current) use of anticoagulants: Secondary | ICD-10-CM | POA: Diagnosis not present

## 2018-02-13 DIAGNOSIS — F1010 Alcohol abuse, uncomplicated: Secondary | ICD-10-CM | POA: Diagnosis not present

## 2018-02-13 DIAGNOSIS — J449 Chronic obstructive pulmonary disease, unspecified: Secondary | ICD-10-CM | POA: Diagnosis not present

## 2018-02-13 DIAGNOSIS — I472 Ventricular tachycardia, unspecified: Secondary | ICD-10-CM

## 2018-02-13 DIAGNOSIS — I428 Other cardiomyopathies: Secondary | ICD-10-CM | POA: Diagnosis not present

## 2018-02-13 DIAGNOSIS — Z95811 Presence of heart assist device: Secondary | ICD-10-CM | POA: Diagnosis not present

## 2018-02-13 DIAGNOSIS — F329 Major depressive disorder, single episode, unspecified: Secondary | ICD-10-CM | POA: Diagnosis not present

## 2018-02-13 DIAGNOSIS — Z79899 Other long term (current) drug therapy: Secondary | ICD-10-CM | POA: Insufficient documentation

## 2018-02-13 DIAGNOSIS — K219 Gastro-esophageal reflux disease without esophagitis: Secondary | ICD-10-CM | POA: Diagnosis not present

## 2018-02-13 DIAGNOSIS — I10 Essential (primary) hypertension: Secondary | ICD-10-CM

## 2018-02-13 DIAGNOSIS — I5022 Chronic systolic (congestive) heart failure: Secondary | ICD-10-CM | POA: Insufficient documentation

## 2018-02-13 DIAGNOSIS — I340 Nonrheumatic mitral (valve) insufficiency: Secondary | ICD-10-CM | POA: Insufficient documentation

## 2018-02-13 DIAGNOSIS — I447 Left bundle-branch block, unspecified: Secondary | ICD-10-CM | POA: Diagnosis not present

## 2018-02-13 LAB — PROTIME-INR
INR: 2.73
Prothrombin Time: 28.5 seconds — ABNORMAL HIGH (ref 11.4–15.2)

## 2018-02-13 LAB — BASIC METABOLIC PANEL
Anion gap: 7 (ref 5–15)
BUN: 12 mg/dL (ref 6–20)
CHLORIDE: 96 mmol/L — AB (ref 98–111)
CO2: 29 mmol/L (ref 22–32)
Calcium: 9.2 mg/dL (ref 8.9–10.3)
Creatinine, Ser: 0.74 mg/dL (ref 0.61–1.24)
GFR calc non Af Amer: >60 mL/min (ref >60)
Glucose, Bld: 94 mg/dL (ref 70–99)
POTASSIUM: 4 mmol/L (ref 3.5–5.1)
SODIUM: 132 mmol/L — AB (ref 135–145)

## 2018-02-13 LAB — CBC
HCT: 30.1 % — ABNORMAL LOW (ref 39.0–52.0)
HEMOGLOBIN: 9.7 g/dL — AB (ref 13.0–17.0)
MCH: 30.9 pg (ref 26.0–34.0)
MCHC: 32.2 g/dL (ref 30.0–36.0)
MCV: 95.9 fL (ref 80.0–100.0)
Platelets: 406 10*3/uL — ABNORMAL HIGH (ref 150–400)
RBC: 3.14 MIL/uL — AB (ref 4.22–5.81)
RDW: 14.7 % (ref 11.5–15.5)
WBC: 8.8 10*3/uL (ref 4.0–10.5)
nRBC: 0 % (ref 0.0–0.2)

## 2018-02-13 LAB — LACTATE DEHYDROGENASE: LDH: 246 U/L — ABNORMAL HIGH (ref 98–192)

## 2018-02-13 MED ORDER — TRAMADOL HCL 50 MG PO TABS: 50.0000 mg | ORAL_TABLET | Freq: Four times a day (QID) | ORAL | 0 refills | Status: DC | PRN

## 2018-02-13 NOTE — Progress Notes (Signed)
PCP: Charlott Holler  HF MD:  Dr Aundra Dubin  Cardiology: Dr Geraldo Pitter.   HPI: Carl Ferguson is a 52 y.o. male with a history of chronic systolic HF previously on home milrinone, NICM s/p Medtronic ICD, chronic LBBB, tobacco abuse, ETOH abuse, substance abuse, hx of device plaments with extractions due to infection, and mitral regurgitation. S/P HMIII10/14/2019.   She has long complicated history of CHF.  Care was initially in [place] and at [facility].  Previously had EF down to 30% in 2011 at initial diagnosis.  In 4/13, he got a single chamber ICD.  In 9/13, he had implantation of a subcutaneous array for high DFTs while on amiodarone for VT.  Echo in 11/15 with EF 30%.  In 11/15, single chamber ICD, leads, and subcutaneous array all removed due to infection.  Discharged with [employer]. In 1/16, he had subcutaneous ICD placed.  In 9/16, this was explanted due to erosion.  EF on echo in 9/16 was 45%.  Echo in 11/17 showed EF 50%.    Admitted to [facility] 2/28 - 06/17/17 with SOB, orthopnea, and PND. He was off medicines for about 6 weeks prior due to insurance issues. Repeat Echo (3/19) significant for fall in EF to 15-20% with severe LV dilation and severe diffuse hypokinesis, moderate-severe MR.   Followed up with Dr. Geraldo Pitter 06/24/17. Feeling better at this appointment. Referred for LHC and HF team. Dr Burt Knack performed [facility] March 2019. No significant CAD.  CPX 3/19 showed peak VO2 24 (68% predicted) with VE/VCO2 slope 37 with RER 0.91 (submaximal).  Probably mild to moderate functional impairment.   He was admitted for LVAD work up. Underwent HM3 implantation on 01/27/18. 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  He returns for post hospital follow up with his girlfriend. Overall feeling pretty good. Having some incisional pain. Taking 3 oxycodone a day + tramadol. Denies SOB/PND/Orthopnea. Walking a little more.  Appetite ok. No fever or chills. Weight at home 143-145 pounds. Taking all  medications. Drinking 1-2 beers a day. Living at a hotel with his girlfriend. His girlfriend is changing driveline dressing daily.   Denies LVAD alarms.  Denies driveline trauma, erythema or drainage.  Denies ICD shocks.   Reports taking Coumadin as prescribed and adherence to anticoagulation based dietary restrictions.  Denies bright red blood per rectum or melena, no dark urine or hematuria.    PMH: 1. Active smoker.  2. Chronic systolic CHF: Nonischemic cardiomyopathy, diagnosed in 2011. HIV negative.  History of drug/ETOH abuse, now stopped.  - Echo 2011: EF 30%.  - In 4/13, he got a single chamber ICD.  In 9/13, he had implantation of a subcutaneous array for high DFTs while on amiodarone for VT.  - Echo 11/15: EF 30%.  - In 11/15, single chamber ICD, leads, and subcutaneous array all removed due to infection. - In 1/16, he had subcutaneous ICD placed.  In 9/16, this was explanted due to erosion. - Echo 9/16: EF 45% - Echo 11/17: EF 50% - Echo (3/19): EF 15-20% with severe LV dilation and severe diffuse hypokinesis, moderate-severe MR.  - LHC (3/19): No significant coronary disease.  - CPX (3/19): Peak VO2 24 (68% predicted) with VE/VCO2 slope 37 with RER 0.91 (submaximal).  Probably mild to moderate functional impairment.  - Echo (9/19):  EF is around 20% with severe dilation, severe central mitral regurgitation. Severe LAE. - RHC (9/19): mean RA 3, PA 41/20 mean 30, mean PCWP 16, CI 1.4, PVR 5.6 WU.  -  TEE (9/19): Severe LV dilation > 7 cm, EF 15-20%, dyssynchrony noted, mildly decreased RV systolic function, severe functional MR.  - Medtronic ICD with His bundle pacing 9/19, unable to place LV lead.  3. LBBB 3. Mitral regurgitation: Severe on 9/19 echo. Suspect primarily functional.   Past Medical History:  Diagnosis Date  . AICD (automatic cardioverter/defibrillator) present   . CHF (congestive heart failure) ([place])   . COPD (chronic obstructive pulmonary disease) ([place])   .  Depression   . Dyspnea   . GERD (gastroesophageal reflux disease)   . Nonischemic cardiomyopathy ([place])    a. diagnosed in 2011 with EF 30% at that time b. EF improved to 50% by echo in 02/2016  . Pneumonia   . Tachycardia     Current Outpatient Medications  Medication Sig Dispense Refill  . acetaminophen (TYLENOL) 500 MG tablet Take 500 mg by mouth every 6 (six) hours as needed for mild pain or fever.     Marland Kitchen amiodarone (PACERONE) 200 MG tablet Take 1 tablet (200 mg total) by mouth daily. 30 tablet 0  . aspirin EC 81 MG tablet Take 1 tablet (81 mg total) by mouth daily. 30 tablet 2  . docusate sodium (COLACE) 100 MG capsule Take 2 capsules (200 mg total) by mouth daily. Hold for diarrhea 10 capsule 0  . feeding supplement, ENSURE ENLIVE, (ENSURE ENLIVE) LIQD Take 237 mLs by mouth 2 (two) times daily between meals. 237 mL 12  . gabapentin (NEURONTIN) 400 MG capsule Take 1 capsule (400 mg total) by mouth 2 (two) times daily. 60 capsule 0  . losartan (COZAAR) 25 MG tablet Take 1 tablet (25 mg total) by mouth daily. 30 tablet 0  . nicotine (NICODERM CQ - DOSED IN MG/24 HOURS) 14 mg/24hr patch Place 14 mg onto the skin daily.    Marland Kitchen omeprazole (PRILOSEC) 20 MG capsule Take 20 mg by mouth daily.    Marland Kitchen oxyCODONE (OXY IR/ROXICODONE) 5 MG immediate release tablet Take 1-2 tablets (5-10 mg total) by mouth every 6 (six) hours as needed for up to 7 days for severe pain. 28 tablet 0  . potassium chloride SA (K-DUR,KLOR-CON) 20 MEQ tablet Take 2 tablets (40 mEq total) by mouth daily. 60 tablet 2  . sildenafil (REVATIO) 20 MG tablet Take 1 tablet (20 mg total) by mouth 3 (three) times daily. 90 tablet 0  . spironolactone (ALDACTONE) 25 MG tablet Take 0.5 tablets (12.5 mg total) by mouth daily. 30 tablet 2  . torsemide (DEMADEX) 20 MG tablet Take 2 tablets (40 mg total) by mouth daily. (Patient taking differently: Take 20 mg by mouth daily. ) 30 tablet 0  . traMADol (ULTRAM) 50 MG tablet Take 1-2 tablets (50-100  mg total) by mouth every 6 (six) hours as needed for up to 7 days for moderate pain. 28 tablet 0  . warfarin (COUMADIN) 3 MG tablet Take 1 tablet (3 mg total) by mouth daily. 30 tablet 11  . albuterol (PROVENTIL HFA;VENTOLIN HFA) 108 (90 Base) MCG/ACT inhaler Inhale 2 puffs into the lungs every 6 (six) hours as needed for wheezing or shortness of breath.    . polyethylene glycol (MIRALAX / GLYCOLAX) packet Take 17 g by mouth daily as needed for severe constipation. (Patient not taking: Reported on 02/13/2018) 14 each 0   No current facility-administered medications for this encounter.     Norflex [orphenadrine]  REVIEW OF SYSTEMS: All systems negative except as listed in HPI, PMH and Problem list.   LVAD  INTERROGATION:  HeartMate 3 LVAD:   Speed: 5700 Flow: 5.1 Power: 4.4  PI: 3.5  Alarms:  Events:  5-10 PI events  Fixed speed: 5700 Low speed limit: 5400  I reviewed the LVAD parameters from today, and compared the results to the patient's prior recorded data.  No programming changes were made.  The LVAD is functioning within specified parameters.  The patient performs LVAD self-test daily.  LVAD interrogation was negative for any significant power changes, alarms or PI events/speed drops.  LVAD equipment check completed and is in good working order.  Back-up equipment present.   LVAD education done on emergency procedures and precautions and reviewed exit site care.    Vitals:   02/13/18 1028 02/13/18 1029  BP: (!) 82/0 (!) 96/47  Weight: 69.8 kg (153 lb 12.8 oz)   Height: '5\' 7"'  (1.702 m)    Wt Readings from Last 3 Encounters:  02/13/18 69.8 kg (153 lb 12.8 oz)  02/10/18 63.7 kg (140 lb 6.9 oz)  01/16/18 65.4 kg (144 lb 3.2 oz)   Physical Exam: GENERAL: Thin male who presents to clinic today in no acute distress. HEENT: normal  NECK: Supple, JVP flat  2+ bilaterally, no bruits.  No lymphadenopathy or thyromegaly appreciated.   CARDIAC:  Mechanical heart sounds with LVAD hum  present. Sternal scar  LUNGS:  Clear to auscultation bilaterally.  ABDOMEN:  Soft, round, nontender, positive bowel sounds x4.     LVAD exit site: Dressing dry and intact.  No erythema or drainage.  Stabilization device present and accurately applied.  EXTREMITIES:  Warm and dry, no cyanosis, clubbing, rash or edema  NEUROLOGIC:  Alert and oriented x 4.  Gait steady.  No aphasia.  No dysarthria.  Affect pleasant.     ASSESSMENT AND PLAN:   1. Chronic Systolic CHF: S/p HM3 81/27/51. Nonischemic cardiomyopathy, diagnosed in 2011, initially followed at [facility]. LHC (3/19) with no significant CAD. CPX (3/19) submaximal, but suggestive of mild to moderate HF limitation. HIV negative 3/19, history of drug/ETOH abuse in the past, no drugs now and hadcut back ETOH considerably. Echo 9/19 showedEF 20% with severe LV dilation, severe MR. NYHA class IV symptoms. He has a wide LBBB. RHC was done in 9/19 showing low output, CI 1.4. He was admitted to start milrinone, unable to wean off milrinone and remained on 0.25 mcg/kg/min at home. Medtronic ICD was placed, he has a His bundle lead but EP was unable to get an LV lead in place =>His bundle pacing does not narrow his QRS and did not help [place] wean him off milrinone. HM3 placed 10/14. Speed increased to 5700 02/05/18 with echo + RHC LVAD optimization.  LVAD interrogation stable.  -NYHA II. Volume status stable. Continue torsemide 40 mg daily. - Continue losartan 25 mg daily - Continue spiro 12.5 mg daily.  -Continue warfarin and ASA 81. INR stable.  - Continue Sildenafil 20 mg TID added for evidence of RV failure with PAPI 1.85. -Maps stable. Continue current regimen.  - AHC following.  2. ETOH Abuse:Drinking a few beers a day.  I encouraged him to stop drinking alcohol.  3. Tobacco abuse: Using nicotine patch. Needs to eventually stop.  4. Mitral regurgitation:Severe functional MR. With LV dilated >7 cm, suspect Mitraclip would be  unlikely to help him appreciably. Decreased MR s/p LVAD placement.  5. VT: Had VT for around 5 minutes 01/29/18.  - Continue 200 mg amiodarone daily.  Will need to follow TSH, LFTS every 6 months.  He is aware he will need an eye exam every year.   Doing well post operatively. INR 2.7.  Stable today.  AHC following. PT consult pending.  Follow up next week. HFSW met with him today.   Greater than 50% of the (total minutes 40) visit spent in counseling/coordination of care regarding the above. Once Medicaid approved will refer to cardiac rehab.    Amy Clegg NP-C  11:46 AM

## 2018-02-13 NOTE — Progress Notes (Signed)
Patient presents for hosp follow up in VAD Clinic today following VAD implant on 01/27/18. Reports no problems with VAD equipment or concerns with drive line.  Pt states that he is doing well at home. He is currently living in a hotel in [place] but had an appointment this morning in [place] for a house. He states that if their paper work goes through he may be able to move in this weekend.    Vital Signs:  Doppler Pressure 82   Automatc BP: 96/47(61) HR: 95   SPO2:99 %  Weight:153.8 lb w/o eqt Last weight: 149 lb Home weights:  lbs   VAD Indication: Destination Therapy    VAD interrogation & Equipment Management: Speed: 5700 Flow: 5.1 Power: 4.4 w    PI: 3.5  Alarms: no clinical alarms Events: 5-10  Fixed speed 5700 Low speed limit: 5400  Primary Controller:  Replace back up battery in 33 months. Back up controller:   Replace back up battery in 33 months.  Annual Equipment Maintenance on UBC/PM was performed on 01/2018.   I reviewed the LVAD parameters from today and compared the results to the patient's prior recorded data. LVAD interrogation was NEGATIVE for significant power changes, NEGATIVE for clinical alarms and STABLE for PI events/speed drops. No programming changes were made and pump is functioning within specified parameters. Pt is performing daily controller and system monitor self tests along with completing weekly and monthly maintenance for LVAD equipment.  LVAD equipment check completed and is in good working order. Back-up equipment present. Charged back up battery and performed self-test on equipment.   Exit Site Care: Drive line is being maintained daily by [facility]. Drive line exit site well healing and unincorporated. The velour is fully implanted at exit site. Dressing dry and intact. Distal suture removed today, proximal stitch intact. No erythema, small amount of serous drainage. Stabilization device present and accurately applied. Pt denies  fever or chills. Pt states they have adequate dressing supplies at home. Continue daily dressing changes.       Significant Events on VAD Support:    Device:Medtronic Therapies: on Last check: 01/14/18   BP & Labs:  MAP 81 - Doppler is not correlating  Hgb 9.7 - No S/S of bleeding. Specifically denies melena/BRBPR or nosebleeds.  LDH stable at 246 with established baseline of 200-300. Denies tea-colored urine. No power elevations noted on interrogation.   Plan: 1. Continue daily dressing changes. 2. Return to clinic in 1 week.  Carlton Adam RN VAD Coordinator   Office: [phone] 24/7 Emergency VAD Pager: [phone]

## 2018-02-13 NOTE — Progress Notes (Signed)
CSW met with patient in the clinic. Patient and caregiver report they have found a place to live in [place] for $395 a month. They reports they have furniture and the monies to move in. Patient hopeful to hear confirmation tomorrow about a move in date this coming weekend. They are currently staying in a motel. Patient and caregiver spoke at length about stress of illness and housing issues. They both spoke of their strained relationship and attribute some of the added stress on their current housing situation. CSW spoke with Joycelyn Schmid separately who shared stressed relationship with patient and his family. Margaret admitted that she has some anxiety and mental health struggles and has a prescription for xanax but has not been taking. CSW provided supportive intervention and encouraged her to follow up with her PCP and explore options for ongoing counseling. Margaret agreed and will follow up next week and return call to [place] if needed. CSW discussed with patient about joining the [facility]'s group for added support. Patient verbalizes understanding and plans to attend both [facility] Group and [facility]. CSW will continue to follow for supportive needs through VAD clinic. Raquel Sarna, [place], [place]

## 2018-02-14 ENCOUNTER — Encounter (HOSPITAL_COMMUNITY): Payer: Medicaid Other

## 2018-02-17 ENCOUNTER — Other Ambulatory Visit (HOSPITAL_COMMUNITY): Payer: Self-pay | Admitting: *Deleted

## 2018-02-17 ENCOUNTER — Encounter (HOSPITAL_COMMUNITY): Payer: Self-pay | Admitting: Unknown Physician Specialty

## 2018-02-17 DIAGNOSIS — Z7901 Long term (current) use of anticoagulants: Secondary | ICD-10-CM

## 2018-02-17 DIAGNOSIS — Z95811 Presence of heart assist device: Secondary | ICD-10-CM

## 2018-02-17 NOTE — Congregational Nurse Program (Signed)
  Dept: [phone]   Congregational Nurse Program Note  Date of Encounter: 02/17/2018  Past Medical History: Past Medical History:  Diagnosis Date  . AICD (automatic cardioverter/defibrillator) present   . CHF (congestive heart failure) ([facility])   . COPD (chronic obstructive pulmonary disease) ([facility])   . Depression   . Dyspnea   . GERD (gastroesophageal reflux disease)   . Nonischemic cardiomyopathy ([facility])    a. diagnosed in 2011 with EF 30% at that time b. EF improved to 50% by echo in 02/2016  . Pneumonia   . 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 Stated he is on the waiting list for a heart transplant. He is wearing heart  Pump until his surgery.B P 100/76 P [address] RN, [facility]. [phone]

## 2018-02-20 ENCOUNTER — Ambulatory Visit (HOSPITAL_COMMUNITY): Payer: Self-pay | Admitting: Pharmacist

## 2018-02-20 ENCOUNTER — Ambulatory Visit (HOSPITAL_COMMUNITY)
Admission: RE | Admit: 2018-02-20 | Discharge: 2018-02-20 | Disposition: A | Discharge status: Home / Self Care | Payer: Medicaid Other | Source: Ambulatory Visit | Attending: Internal Medicine | Admitting: Internal Medicine

## 2018-02-20 VITALS — BP 109/68 | Ht 67.0 in | Wt 155.0 lb

## 2018-02-20 DIAGNOSIS — Z7901 Long term (current) use of anticoagulants: Secondary | ICD-10-CM | POA: Diagnosis not present

## 2018-02-20 DIAGNOSIS — Z7982 Long term (current) use of aspirin: Secondary | ICD-10-CM | POA: Insufficient documentation

## 2018-02-20 DIAGNOSIS — F329 Major depressive disorder, single episode, unspecified: Secondary | ICD-10-CM | POA: Diagnosis not present

## 2018-02-20 DIAGNOSIS — I472 Ventricular tachycardia, unspecified: Secondary | ICD-10-CM

## 2018-02-20 DIAGNOSIS — F1010 Alcohol abuse, uncomplicated: Secondary | ICD-10-CM | POA: Diagnosis not present

## 2018-02-20 DIAGNOSIS — I428 Other cardiomyopathies: Secondary | ICD-10-CM | POA: Insufficient documentation

## 2018-02-20 DIAGNOSIS — Z79899 Other long term (current) drug therapy: Secondary | ICD-10-CM | POA: Insufficient documentation

## 2018-02-20 DIAGNOSIS — Z95810 Presence of automatic (implantable) cardiac defibrillator: Secondary | ICD-10-CM | POA: Diagnosis not present

## 2018-02-20 DIAGNOSIS — Z95811 Presence of heart assist device: Secondary | ICD-10-CM

## 2018-02-20 DIAGNOSIS — I5022 Chronic systolic (congestive) heart failure: Secondary | ICD-10-CM | POA: Diagnosis not present

## 2018-02-20 DIAGNOSIS — Z720 Tobacco use: Secondary | ICD-10-CM | POA: Diagnosis not present

## 2018-02-20 DIAGNOSIS — I340 Nonrheumatic mitral (valve) insufficiency: Secondary | ICD-10-CM | POA: Insufficient documentation

## 2018-02-20 DIAGNOSIS — I447 Left bundle-branch block, unspecified: Secondary | ICD-10-CM | POA: Diagnosis not present

## 2018-02-20 LAB — CBC
HEMATOCRIT: 37.8 % — AB (ref 39.0–52.0)
HEMOGLOBIN: 12.3 g/dL — AB (ref 13.0–17.0)
MCH: 31.1 pg (ref 26.0–34.0)
MCHC: 32.5 g/dL (ref 30.0–36.0)
MCV: 95.5 fL (ref 80.0–100.0)
Platelets: 306 10*3/uL (ref 150–400)
RBC: 3.96 MIL/uL — AB (ref 4.22–5.81)
RDW: 15.1 % (ref 11.5–15.5)
WBC: 11.8 10*3/uL — AB (ref 4.0–10.5)
nRBC: 0 % (ref 0.0–0.2)

## 2018-02-20 LAB — PROTIME-INR
INR: 2.14
Prothrombin Time: 23.7 seconds — ABNORMAL HIGH (ref 11.4–15.2)

## 2018-02-20 LAB — BASIC METABOLIC PANEL
ANION GAP: 9 (ref 5–15)
BUN: 10 mg/dL (ref 6–20)
CO2: 24 mmol/L (ref 22–32)
Calcium: 9.4 mg/dL (ref 8.9–10.3)
Chloride: 101 mmol/L (ref 98–111)
Creatinine, Ser: 0.96 mg/dL (ref 0.61–1.24)
GFR calc non Af Amer: >60 mL/min (ref >60)
Glucose, Bld: 127 mg/dL — ABNORMAL HIGH (ref 70–99)
POTASSIUM: 4.1 mmol/L (ref 3.5–5.1)
SODIUM: 134 mmol/L — AB (ref 135–145)

## 2018-02-20 LAB — LACTATE DEHYDROGENASE: LDH: 252 U/L — AB (ref 98–192)

## 2018-02-20 MED ORDER — SERTRALINE HCL 25 MG PO TABS: 25.0000 mg | ORAL_TABLET | Freq: Every day | ORAL | 3 refills | Status: DC

## 2018-02-20 MED ORDER — TRAMADOL HCL 50 MG PO TABS: 50.0000 mg | ORAL_TABLET | Freq: Four times a day (QID) | ORAL | 0 refills | Status: AC | PRN

## 2018-02-20 MED ORDER — TORSEMIDE 20 MG PO TABS: 20.0000 mg | ORAL_TABLET | Freq: Every day | ORAL | Status: DC

## 2018-02-20 MED FILL — traMADol HCL 50 MG TABS: 50 | 4 days supply | Qty: 28 | Fill #0

## 2018-02-20 MED FILL — SERTRALINE HCL 25 MG TABLET: 25 | 30 days supply | Qty: 30 | Fill #0

## 2018-02-20 NOTE — Addendum Note (Signed)
Encounter addended by: Marcy Siren, LCSW on: 02/20/2018 3:09 PM  Actions taken: Sign clinical note

## 2018-02-20 NOTE — Progress Notes (Signed)
Patient presents for hosp follow up in VAD Clinic today following VAD implant on 01/27/18. Reports no problems with VAD equipment or concerns with drive line.  Pt moved into an apartment over the weekend. 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   Vital Signs:  Doppler Pressure 102  Automatc BP: 109/68/(80) HR: 97   SPO2:97 %  Weight:155 lb w/o eqt Last weight: 153.8 lb Home weights:  lbs   VAD Indication: Destination Therapy    VAD interrogation & Equipment Management: Speed: 5700 Flow: 4.8 Power: 4.6 w    PI: 4.2  Alarms: several low voltage and no external powers-pt states that he has been unplugging his MPU and carrying it around. Informed pt he cannot do this, he must go on batteries. Events: 3-5  Fixed speed 5700 Low speed limit: 5400  Primary Controller:  Replace back up battery in 32 months. Back up controller:   Replace back up battery in 32 months.  Annual Equipment Maintenance on UBC/PM was performed on 01/2018.   I reviewed the LVAD parameters from today and compared the results to the patient's prior recorded data. LVAD interrogation was NEGATIVE for significant power changes, NEGATIVE for clinical alarms and STABLE for PI events/speed drops. No programming changes were made and pump is functioning within specified parameters. Pt is performing daily controller and system monitor self tests along with completing weekly and monthly maintenance for LVAD equipment.  LVAD equipment check completed and is in good working order. Back-up equipment present. Charged back up battery and performed self-test on equipment.   Exit Site Care: Drive line is being maintained daily by [facility]. Drive line exit site well healing and unincorporated. The velour is fully implanted at exit site. Dressing dry and intact. Proximal suture removed today. No erythema, small  amount of serous drainage. Stabilization device present and accurately applied. Pt denies fever or chills. Pt states they have adequate dressing supplies at home. Advance to every other day dressing changes.   Significant Events on VAD Support:    Device:Medtronic Therapies: on Last check: 01/14/18   BP & Labs:  MAP 80 - Doppler is   Hgb 12.3 - No S/S of bleeding. Specifically denies melena/BRBPR or nosebleeds.  LDH stable at  with established baseline of 200-300. Denies tea-colored urine. No power elevations noted on interrogation.   Plan:  2. Return to clinic in 1 week.  Carlton Adam RN VAD Coordinator   Office: [phone] 24/7 Emergency VAD Pager: [phone]   PCP: Julio Alm  HF MD:  Dr Shirlee Latch  Cardiology: Dr Tomie China.   HPI: Carl Ferguson is a 52 y.o. male with a history of chronic systolic HF previously on home milrinone, NICM s/p Medtronic ICD, chronic LBBB, tobacco abuse, ETOH abuse, substance abuse, hx of device plaments with extractions due to infection, and mitral regurgitation. S/P HMIII 01/27/2018.   He has long complicated history of CHF.  Care was initially in [place] and at [facility].  Previously had EF down to 30% in 2011 at initial diagnosis.  In 4/13, he got a single chamber ICD.  In 9/13, he had implantation of a subcutaneous array for high DFTs while on amiodarone for VT.  Echo in 11/15 with EF 30%.  In 11/15, single chamber ICD, leads, and subcutaneous array all removed due to infection.  Discharged with [employer]. In 1/16, he had  subcutaneous ICD placed.  In 9/16, this was explanted due to erosion.  EF on echo in 9/16 was 45%.  Echo in 11/17 showed EF 50%.    Admitted to [facility] 2/28 - 06/17/17 with SOB, orthopnea, and PND. He was off medicines for about 6 weeks prior due to insurance issues. Repeat Echo (3/19) significant for fall in EF to 15-20% with severe LV dilation and severe diffuse hypokinesis, moderate-severe MR.   Followed up with Dr.  Tomie China 06/24/17. Feeling better at this appointment. Referred for LHC and HF team. Dr Excell Seltzer performed [facility] March 2019. No significant CAD.  CPX 3/19 showed peak VO2 24 (68% predicted) with VE/VCO2 slope 37 with RER 0.91 (submaximal).  Probably mild to moderate functional impairment.   He was admitted for LVAD work up. Underwent HM3 implantation on 01/27/18. 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  He returns for followup of LVAD.  Symptomatically doing quite well.  Has not been exercising much but has no exertional dyspnea.  He is able to do all ADLs.  No fever/chills.  No lightheadedness.  Patient and girlfriend report that he has had a depressed mood over his finances and over concerns that he will not be able to work.    Denies LVAD alarms.  Denies driveline trauma, erythema or drainage.  Denies ICD shocks.   Reports taking Coumadin as prescribed and adherence to anticoagulation based dietary restrictions.  Denies bright red blood per rectum or melena, no dark urine or hematuria.    PMH: 1. Active smoker.  2. Chronic systolic CHF: Nonischemic cardiomyopathy, diagnosed in 2011. HIV negative.  History of drug/ETOH abuse, now stopped.  - Echo 2011: EF 30%.  - In 4/13, he got a single chamber ICD.  In 9/13, he had implantation of a subcutaneous array for high DFTs while on amiodarone for VT.  - Echo 11/15: EF 30%.  - In 11/15, single chamber ICD, leads, and subcutaneous array all removed due to infection. - In 1/16, he had subcutaneous ICD placed.  In 9/16, this was explanted due to erosion. - Echo 9/16: EF 45% - Echo 11/17: EF 50% - Echo (3/19): EF 15-20% with severe LV dilation and severe diffuse hypokinesis, moderate-severe MR.  - LHC (3/19): No significant coronary disease.  - CPX (3/19): Peak VO2 24 (68% predicted) with VE/VCO2 slope 37 with RER 0.91 (submaximal).  Probably mild to moderate functional impairment.  - Echo (9/19):  EF is around 20% with severe dilation, severe  central mitral regurgitation. Severe LAE. - RHC (9/19): mean RA 3, PA 41/20 mean 30, mean PCWP 16, CI 1.4, PVR 5.6 WU.  - TEE (9/19): Severe LV dilation > 7 cm, EF 15-20%, dyssynchrony noted, mildly decreased RV systolic function, severe functional MR.  - Medtronic ICD with His bundle pacing 9/19, unable to place LV lead.  - HM3 LVAD 10/19 3. LBBB 4. Mitral regurgitation: Severe on 9/19 echo. Suspect primarily functional.  5. Depression   Current Outpatient Medications  Medication Sig Dispense Refill  . acetaminophen (TYLENOL) 500 MG tablet Take 500 mg by mouth every 6 (six) hours as needed for mild pain or fever.     Marland Kitchen amiodarone (PACERONE) 200 MG tablet Take 1 tablet (200 mg total) by mouth daily. 30 tablet 0  . aspirin EC 81 MG tablet Take 1 tablet (81 mg total) by mouth daily. 30 tablet 2  . feeding supplement, ENSURE ENLIVE, (ENSURE ENLIVE) LIQD Take 237 mLs by mouth 2 (two) times daily between meals.  237 mL 12  . gabapentin (NEURONTIN) 400 MG capsule Take 1 capsule (400 mg total) by mouth 2 (two) times daily. 60 capsule 0  . losartan (COZAAR) 25 MG tablet Take 1 tablet (25 mg total) by mouth daily. 30 tablet 0  . nicotine (NICODERM CQ - DOSED IN MG/24 HOURS) 14 mg/24hr patch Place 14 mg onto the skin daily.    Marland Kitchen omeprazole (PRILOSEC) 20 MG capsule Take 20 mg by mouth daily.    . potassium chloride SA (K-DUR,KLOR-CON) 20 MEQ tablet Take 2 tablets (40 mEq total) by mouth daily. (Patient taking differently: Take 20 mEq by mouth daily. ) 60 tablet 2  . sildenafil (REVATIO) 20 MG tablet Take 1 tablet (20 mg total) by mouth 3 (three) times daily. 90 tablet 0  . spironolactone (ALDACTONE) 25 MG tablet Take 0.5 tablets (12.5 mg total) by mouth daily. 30 tablet 2  . torsemide (DEMADEX) 20 MG tablet Take 1 tablet (20 mg total) by mouth daily. Take 1 tablet every other day    . traMADol (ULTRAM) 50 MG tablet Take 1-2 tablets (50-100 mg total) by mouth every 6 (six) hours as needed for up to 7 days  for moderate pain. 28 tablet 0  . warfarin (COUMADIN) 3 MG tablet Take 1 tablet (3 mg total) by mouth daily. 30 tablet 11  . albuterol (PROVENTIL HFA;VENTOLIN HFA) 108 (90 Base) MCG/ACT inhaler Inhale 2 puffs into the lungs every 6 (six) hours as needed for wheezing or shortness of breath.    . docusate sodium (COLACE) 100 MG capsule Take 2 capsules (200 mg total) by mouth daily. Hold for diarrhea (Patient not taking: Reported on 02/20/2018) 10 capsule 0  . polyethylene glycol (MIRALAX / GLYCOLAX) packet Take 17 g by mouth daily as needed for severe constipation. (Patient not taking: Reported on 02/13/2018) 14 each 0  . sertraline (ZOLOFT) 25 MG tablet Take 1 tablet (25 mg total) by mouth daily. Please use heart failure fund 30 tablet 3   No current facility-administered medications for this encounter.     Norflex [orphenadrine]  REVIEW OF SYSTEMS: All systems negative except as listed in HPI, PMH and Problem list.   LVAD INTERROGATION:  See nurse's note above for details.   I reviewed the LVAD parameters from today, and compared the results to the patient's prior recorded data.  No programming changes were made.  The LVAD is functioning within specified parameters.  The patient performs LVAD self-test daily.  LVAD interrogation was negative for any significant power changes, alarms or PI events/speed drops.  LVAD equipment check completed and is in good working order.  Back-up equipment present.   LVAD education done on emergency procedures and precautions and reviewed exit site care.    Vitals:   02/20/18 0844 02/20/18 0845  BP: (!) 102/0 109/68  SpO2: 97%   Weight: 70.3 kg (155 lb)   Height: 5\' 7"  (1.702 m)    Wt Readings from Last 3 Encounters:  02/20/18 70.3 kg (155 lb)  02/13/18 69.8 kg (153 lb 12.8 oz)  02/10/18 63.7 kg (140 lb 6.9 oz)   Physical Exam: General: Well appearing this am. NAD.  HEENT: Normal. Neck: Supple, JVP 7-8 cm. Carotids OK.  Cardiac:  Mechanical heart  sounds with LVAD hum present.  Lungs:  CTAB, normal effort.  Abdomen:  NT, ND, no HSM. No bruits or masses. +BS  LVAD exit site: Well-healed and incorporated. Dressing dry and intact. No erythema or drainage. Stabilization device present and accurately  applied. Driveline dressing changed daily per sterile technique. Extremities:  Warm and dry. No cyanosis, clubbing, rash, or edema.  Neuro:  Alert & oriented x 3. Cranial nerves grossly intact. Moves all 4 extremities w/o difficulty. Affect pleasant    ASSESSMENT AND PLAN:   1. Chronic Systolic CHF: S/p HM3 01/27/18. Nonischemic cardiomyopathy, diagnosed in 2011, initially followed at [facility]. LHC (3/19) with no significant CAD. CPX (3/19) submaximal, but suggestive of mild to moderate HF limitation. HIV negative 3/19, history of drug/ETOH abuse in the past, no drugs now and hadcut back ETOH considerably. Echo 9/19 showedEF 20% with severe LV dilation, severe MR. NYHA class IV symptoms. He has a wide LBBB. RHC was done in 9/19 showing low output, CI 1.4. He was admitted to start milrinone, unable to wean off milrinone and remained on 0.25 mcg/kg/min at home. Medtronic ICD was placed, he has a His bundle lead but EP was unable to get an LV lead in place =>His bundle pacing does not narrow his QRS and did not help [place] wean him off milrinone. HM3 placed 01/27/18. Speed increased to 5700 02/05/18 with echo + RHC LVAD optimization. LVAD interrogation stable, no PI events. NYHA class II symptoms. He is not volume overloaded on exam. MAP controlled.  - Decrease torsemide to 20 mg every other day.  - Continue losartan 25 mg daily - Continue spiro 12.5 mg daily.  -Continue warfarin with INR goal 2-2.5 and ASA 81.  Pending INR today.  - Continue Sildenafil 20 mg TID, added for evidence of RV failure with PAPI 1.85 on post-op RHC. - He will be referred to cardiac rehab at [facility]. 2. ETOH Abuse:Drinking a few beers a day.  I encouraged him to  stop drinking alcohol.  3. Tobacco abuse: Using nicotine patch. Needs to eventually stop.  4. Mitral regurgitation: Functional.  Decreased MR s/p LVAD placement.  5. VT:  No further VT.   - Will aim to stop amiodarone after another month or so.  Do not want him to continue long-term.  6. Depression: I will start him on sertraline 25 mg daily, this can be titrated up.   Marca Ancona 02/20/2018

## 2018-02-20 NOTE — Patient Instructions (Addendum)
1. Advance to every other day dressing changes.  2. Start Zoloft 25 mg daily. 3. Decrease Torsemide to every other day.  4. Please obtain labs next Thursday at [facility]. 5. Return to clinic 11/21 at 0900.

## 2018-02-20 NOTE — Progress Notes (Signed)
CSW met with patient and caregiver Joycelyn Schmid in the clinic to review disability. Patient reports he need to follow up with SSI regarding a change in address. CSW and patient contacted [employer] and told that a determination has been made and sent back to the local office. CSW and patient left message for return call to patient from [place] office [phone]. Patient verbalizes understanding and will reach out to CSW if needed regarding disability. CSW continues to follow through VAD clinic as needed.  Raquel Sarna, [place], [place]

## 2018-02-20 NOTE — Addendum Note (Signed)
Encounter addended by: Lebron Quam, RN on: 02/20/2018 2:57 PM  Actions taken: Sign clinical note

## 2018-02-20 NOTE — Progress Notes (Signed)
Patient presents for 1 week follow up in VAD Clinic today. Reports no problems with VAD equipment or concerns with drive line.  Pt moved into an apartment over the weekend. 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   Vital Signs:  Doppler Pressure 102  Automatc BP: 109/68/(80) HR: 97   SPO2:97 %  Weight:155 lb w/o eqt Last weight: 153.8 lb Home weights:  lbs   VAD Indication: Destination Therapy    VAD interrogation & Equipment Management: Speed: 5700 Flow: 4.8 Power: 4.6 w    PI: 4.2  Alarms: several low voltage and no external powers-pt states that he has been unplugging his MPU and carrying it around. Informed pt he cannot do this, he must go on batteries. Events: 3-5  Fixed speed 5700 Low speed limit: 5400  Primary Controller:  Replace back up battery in 32 months. Back up controller:   Replace back up battery in 32 months.  Annual Equipment Maintenance on UBC/PM was performed on 01/2018.   I reviewed the LVAD parameters from today and compared the results to the patient's prior recorded data. LVAD interrogation was NEGATIVE for significant power changes, NEGATIVE for clinical alarms and STABLE for PI events/speed drops. No programming changes were made and pump is functioning within specified parameters. Pt is performing daily controller and system monitor self tests along with completing weekly and monthly maintenance for LVAD equipment.  LVAD equipment check completed and is in good working order. Back-up equipment present. Charged back up battery and performed self-test on equipment.   Exit Site Care: Drive line is being maintained daily by [facility]. Drive line exit site well healing and unincorporated. The velour is fully implanted at exit site. Dressing dry and intact. Proximal suture removed today. No erythema, small amount of serous  drainage. Stabilization device present and accurately applied. Pt denies fever or chills. Pt states they have adequate dressing supplies at home. Advance to every other day dressing changes.   Significant Events on VAD Support:    Device:Medtronic Therapies: on Last check: 01/14/18   BP & Labs:  MAP 80 - Doppler is reflecting modified systolic  Hgb 12.3 - No S/S of bleeding. Specifically denies melena/BRBPR or nosebleeds.  LDH stable at 252 with established baseline of 200-300. Denies tea-colored urine. No power elevations noted on interrogation.   Plan: 1. Advance to every other day dressing changes.  2. Start Zoloft 25 mg daily. 3. Decrease Torsemide to every other day.  4. Please obtain labs next Thursday at [facility] 5. Return to clinic 11/21 at 0900.   Carlton Adam RN VAD Coordinator   Office: [phone] 24/7 Emergency VAD Pager: [phone]

## 2018-02-26 ENCOUNTER — Encounter: Payer: Self-pay | Admitting: Internal Medicine

## 2018-02-28 ENCOUNTER — Ambulatory Visit (HOSPITAL_COMMUNITY): Payer: Self-pay | Admitting: Pharmacist

## 2018-02-28 ENCOUNTER — Other Ambulatory Visit (HOSPITAL_COMMUNITY)
Admission: RE | Admit: 2018-02-28 | Discharge: 2018-02-28 | Disposition: A | Discharge status: Home / Self Care | Payer: Medicaid Other | Source: Ambulatory Visit | Attending: Cardiology | Admitting: Cardiology

## 2018-02-28 DIAGNOSIS — Z95811 Presence of heart assist device: Secondary | ICD-10-CM | POA: Insufficient documentation

## 2018-02-28 LAB — PROTIME-INR
INR: 2.06
Prothrombin Time: 22.9 seconds — ABNORMAL HIGH (ref 11.4–15.2)

## 2018-03-04 ENCOUNTER — Other Ambulatory Visit (HOSPITAL_COMMUNITY): Payer: Self-pay | Admitting: *Deleted

## 2018-03-04 DIAGNOSIS — Z95811 Presence of heart assist device: Secondary | ICD-10-CM

## 2018-03-04 DIAGNOSIS — Z7901 Long term (current) use of anticoagulants: Secondary | ICD-10-CM

## 2018-03-06 ENCOUNTER — Ambulatory Visit (HOSPITAL_COMMUNITY)
Admission: RE | Admit: 2018-03-06 | Discharge: 2018-03-06 | Disposition: A | Discharge status: Home / Self Care | Payer: Medicaid Other | Source: Ambulatory Visit | Attending: Internal Medicine | Admitting: Internal Medicine

## 2018-03-06 ENCOUNTER — Encounter (HOSPITAL_COMMUNITY): Payer: Self-pay

## 2018-03-06 ENCOUNTER — Ambulatory Visit (HOSPITAL_COMMUNITY): Payer: Self-pay | Admitting: Pharmacist

## 2018-03-06 VITALS — BP 112/88 | HR 86 | Ht 67.0 in | Wt 157.6 lb

## 2018-03-06 DIAGNOSIS — I340 Nonrheumatic mitral (valve) insufficiency: Secondary | ICD-10-CM | POA: Diagnosis not present

## 2018-03-06 DIAGNOSIS — I472 Ventricular tachycardia: Secondary | ICD-10-CM | POA: Diagnosis not present

## 2018-03-06 DIAGNOSIS — F329 Major depressive disorder, single episode, unspecified: Secondary | ICD-10-CM | POA: Insufficient documentation

## 2018-03-06 DIAGNOSIS — Z95811 Presence of heart assist device: Secondary | ICD-10-CM | POA: Diagnosis not present

## 2018-03-06 DIAGNOSIS — F1010 Alcohol abuse, uncomplicated: Secondary | ICD-10-CM | POA: Insufficient documentation

## 2018-03-06 DIAGNOSIS — I5022 Chronic systolic (congestive) heart failure: Secondary | ICD-10-CM | POA: Diagnosis not present

## 2018-03-06 DIAGNOSIS — Z79899 Other long term (current) drug therapy: Secondary | ICD-10-CM | POA: Insufficient documentation

## 2018-03-06 DIAGNOSIS — Z7901 Long term (current) use of anticoagulants: Secondary | ICD-10-CM | POA: Insufficient documentation

## 2018-03-06 DIAGNOSIS — F17210 Nicotine dependence, cigarettes, uncomplicated: Secondary | ICD-10-CM | POA: Diagnosis not present

## 2018-03-06 DIAGNOSIS — I428 Other cardiomyopathies: Secondary | ICD-10-CM | POA: Diagnosis not present

## 2018-03-06 LAB — LACTATE DEHYDROGENASE: LDH: 217 U/L — ABNORMAL HIGH (ref 98–192)

## 2018-03-06 LAB — BASIC METABOLIC PANEL
Anion gap: 8 (ref 5–15)
BUN: 18 mg/dL (ref 6–20)
CHLORIDE: 102 mmol/L (ref 98–111)
CO2: 23 mmol/L (ref 22–32)
CREATININE: 1.39 mg/dL — AB (ref 0.61–1.24)
Calcium: 9.2 mg/dL (ref 8.9–10.3)
GFR calc Af Amer: >60 mL/min (ref >60)
GFR calc non Af Amer: 57 mL/min — ABNORMAL LOW (ref >60)
GLUCOSE: 90 mg/dL (ref 70–99)
Potassium: 4.8 mmol/L (ref 3.5–5.1)
SODIUM: 133 mmol/L — AB (ref 135–145)

## 2018-03-06 LAB — CBC
HCT: 36.4 % — ABNORMAL LOW (ref 39.0–52.0)
HEMOGLOBIN: 11.9 g/dL — AB (ref 13.0–17.0)
MCH: 30.9 pg (ref 26.0–34.0)
MCHC: 32.7 g/dL (ref 30.0–36.0)
MCV: 94.5 fL (ref 80.0–100.0)
Platelets: 239 10*3/uL (ref 150–400)
RBC: 3.85 MIL/uL — ABNORMAL LOW (ref 4.22–5.81)
RDW: 15.6 % — ABNORMAL HIGH (ref 11.5–15.5)
WBC: 8.2 10*3/uL (ref 4.0–10.5)
nRBC: 0 % (ref 0.0–0.2)

## 2018-03-06 LAB — PROTIME-INR
INR: 2.57
Prothrombin Time: 27.2 seconds — ABNORMAL HIGH (ref 11.4–15.2)

## 2018-03-06 MED ORDER — AMIODARONE HCL 200 MG PO TABS: 200.0000 mg | ORAL_TABLET | Freq: Every day | ORAL | 0 refills | Status: DC

## 2018-03-06 MED ORDER — SPIRONOLACTONE 25 MG PO TABS: 25.0000 mg | ORAL_TABLET | Freq: Every day | ORAL | 2 refills | Status: DC

## 2018-03-06 MED ORDER — TORSEMIDE 20 MG PO TABS: ORAL_TABLET | ORAL | 0 refills | Status: DC

## 2018-03-06 MED ORDER — GABAPENTIN 400 MG PO CAPS: 400.0000 mg | ORAL_CAPSULE | Freq: Two times a day (BID) | ORAL | 0 refills | Status: DC

## 2018-03-06 MED ORDER — SILDENAFIL CITRATE 20 MG PO TABS: 20.0000 mg | ORAL_TABLET | Freq: Three times a day (TID) | ORAL | 0 refills | Status: DC

## 2018-03-06 MED ORDER — OMEPRAZOLE 20 MG PO CPDR: 20.0000 mg | DELAYED_RELEASE_CAPSULE | Freq: Every day | ORAL | 0 refills | Status: DC

## 2018-03-06 MED ORDER — LOSARTAN POTASSIUM 25 MG PO TABS: 25.0000 mg | ORAL_TABLET | Freq: Every day | ORAL | 0 refills | Status: DC

## 2018-03-06 MED FILL — AMIODARONE HCL 200 MG TAB: 200 | 30 days supply | Qty: 30 | Fill #0

## 2018-03-06 MED FILL — LOSARTAN POTASSIUM 25 MG TA: 25 | 30 days supply | Qty: 30 | Fill #0

## 2018-03-06 MED FILL — GABAPENTIN 400 MG CAPSULE: 400 | 30 days supply | Qty: 60 | Fill #0

## 2018-03-06 MED FILL — SPIRONOLACTONE 25 MG TABLET: 25 | 30 days supply | Qty: 30 | Fill #0

## 2018-03-06 MED FILL — TORSEMIDE 20 MG TABLET: 20 | 30 days supply | Qty: 30 | Fill #0

## 2018-03-06 MED FILL — SILDENAFIL CITRATE 20 MG TA: 20 | 30 days supply | Qty: 90 | Fill #0

## 2018-03-06 NOTE — Progress Notes (Signed)
Patient presents for 1 week follow up in VAD Clinic today. Reports no problems with VAD equipment or concerns with drive line.  Pt says his mood is bette overall after starting Zoloft; says he "out of funk". Reports increased stamina, is walking daily. [facility] signed off yesterday, pt says he is not interested in Cardiac Rehab at this time, still waiting on paperwork for insurance/disability.    Pt is still drinking protein supplement twice daily. Reports very good appetite.   He admits to a "few" orthostatic dizzy spells. Pt has not taken am meds this visit.    Vital Signs:  Doppler Pressure 104 Automatc BP: 112/88 (105) HR:  86 SPO2: 99%  Weight:157.6 lb w/o eqt Last weight: 155 lbs Home weights: 149 - 151 lbs   VAD Indication: Destination Therapy    VAD interrogation & Equipment Management: Speed: 5700 Flow: 4.7 Power: 4.5 w PI: 4.2  Alarms: none Events: 15 - >40  Fixed speed 5700 Low speed limit: 5400  Primary Controller: Replace back up battery in 32 months. Back up controller: Replace back up battery in 65months.  Annual Equipment Maintenance on UBC/PM was performed on 01/2018.  I reviewed the LVAD parameters from todayand compared the results to the patient's prior recorded data.LVAD interrogation was NEGATIVEfor significant power changes, NEGATIVEfor clinicalalarms and STABLEfor PI events/speed drops. No programming changes were madeand pump is functioning within specified parameters. Pt is performing daily controller and system monitor self tests along with completing weekly and monthly maintenance for LVAD equipment.  LVAD equipment check completed and is in good working order. Back-up equipment present.  Exit Site Care: Drive line is being maintained every other day by Claris Che. Drive line exit site well healing and unincorporated. The velour is fully implanted at exit site. Dressing dry and intact. No erythema, small  amount of serous drainage. Stabilization device present and accurately applied. Pt denies fever or chills. Pt states they have adequate dressing supplies at home. Advance to twice weekly dressing changes.   Significant Events on VAD Support:    Device:Medtronic Therapies: on 200 bpm Monitor: on slow VT 176; AT 171 Last check: 01/14/18   BP &Labs:  MAP 104- Doppler is reflecting MAP  Hgb 11.9- No S/S of bleeding. Specifically denies melena/BRBPR or nosebleeds.  LDH stable at 217 with established baseline of 200-300. Denies tea-colored urine. No power elevations noted on interrogation.   Patient Instructions: 1. Increase Aldactone to 25 mg daily 2. Decrease Torsemide to Mon/Fri (may take extra dose on Wed if needed) 3. Advance dressing changes to twice weekly  4. Return to VAD clinic in 2 weeks.    Carl Diener RN VAD Coordinator   Office: [phone] 24/7 Emergency VAD Pager: [phone]   PCP: Julio Alm  HF MD:  Dr Shirlee Latch  Cardiology: Dr Tomie China.   HPI: Carl Ferguson is a 52 y.o. male with a history of chronic systolic HF previously on home milrinone, NICM s/p Medtronic ICD, chronic LBBB, tobacco abuse, ETOH abuse, substance abuse, hx of device plaments with extractions due to infection, and mitral regurgitation. S/P HMIII 01/27/2018.   He has long complicated history of CHF.  Care was initially in [place] and at [facility].  Previously had EF down to 30% in 2011 at initial diagnosis.  In 4/13, he got a single chamber ICD.  In 9/13, he had implantation of a subcutaneous array for high DFTs while on amiodarone for VT.  Echo in 11/15 with EF 30%.  In 11/15,  single chamber ICD, leads, and subcutaneous array all removed due to infection.  Discharged with [employer]. In 1/16, he had subcutaneous ICD placed.  In 9/16, this was explanted due to erosion.  EF on echo in 9/16 was 45%.  Echo in 11/17 showed EF 50%.    Admitted to [facility] 2/28 - 06/17/17 with SOB, orthopnea,  and PND. He was off medicines for about 6 weeks prior due to insurance issues. Repeat Echo (3/19) significant for fall in EF to 15-20% with severe LV dilation and severe diffuse hypokinesis, moderate-severe MR.   Followed up with Dr. Tomie China 06/24/17. Feeling better at this appointment. Referred for LHC and HF team. Dr Excell Seltzer performed [facility] March 2019. No significant CAD.  CPX 3/19 showed peak VO2 24 (68% predicted) with VE/VCO2 slope 37 with RER 0.91 (submaximal).  Probably mild to moderate functional impairment.   He was admitted for LVAD work up. Underwent HM3 implantation on 01/27/18. 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  He presents today for regular follow up. Doing very well symptomatically. Mood much improved on Zoloft. Able to do all of his ADLs without difficult. Walking for about 20 minutes a day. Their apartment is small, but they have everything they need. He states he has been approved for Medicaid, now just waiting on confirmation. Occasional lightheadedness. Denies fevers or chills. Denies LVAD alarms, Driveline trauma, erythema or drainage. No ICD shocks.   Reports taking Coumadin as prescribed and adherence to anticoagulation based dietary restrictions.  Denies bright red blood per rectum or melena, no dark urine or hematuria.     PMH: 1. Active smoker.  2. Chronic systolic CHF: Nonischemic cardiomyopathy, diagnosed in 2011. HIV negative.  History of drug/ETOH abuse, now stopped.  - Echo 2011: EF 30%.  - In 4/13, he got a single chamber ICD.  In 9/13, he had implantation of a subcutaneous array for high DFTs while on amiodarone for VT.  - Echo 11/15: EF 30%.  - In 11/15, single chamber ICD, leads, and subcutaneous array all removed due to infection. - In 1/16, he had subcutaneous ICD placed.  In 9/16, this was explanted due to erosion. - Echo 9/16: EF 45% - Echo 11/17: EF 50% - Echo (3/19): EF 15-20% with severe LV dilation and severe diffuse hypokinesis,  moderate-severe MR.  - LHC (3/19): No significant coronary disease.  - CPX (3/19): Peak VO2 24 (68% predicted) with VE/VCO2 slope 37 with RER 0.91 (submaximal).  Probably mild to moderate functional impairment.  - Echo (9/19):  EF is around 20% with severe dilation, severe central mitral regurgitation. Severe LAE. - RHC (9/19): mean RA 3, PA 41/20 mean 30, mean PCWP 16, CI 1.4, PVR 5.6 WU.  - TEE (9/19): Severe LV dilation > 7 cm, EF 15-20%, dyssynchrony noted, mildly decreased RV systolic function, severe functional MR.  - Medtronic ICD with His bundle pacing 9/19, unable to place LV lead.  - HM3 LVAD 10/19 3. LBBB 4. Mitral regurgitation: Severe on 9/19 echo. Suspect primarily functional.  5. Depression   Current Outpatient Medications  Medication Sig Dispense Refill  . acetaminophen (TYLENOL) 500 MG tablet Take 500 mg by mouth every 6 (six) hours as needed for mild pain or fever.     Marland Kitchen albuterol (PROVENTIL HFA;VENTOLIN HFA) 108 (90 Base) MCG/ACT inhaler Inhale 2 puffs into the lungs every 6 (six) hours as needed for wheezing or shortness of breath.    Marland Kitchen amiodarone (PACERONE) 200 MG tablet Take 1 tablet (200 mg total)  by mouth daily. 30 tablet 0  . aspirin EC 81 MG tablet Take 1 tablet (81 mg total) by mouth daily. 30 tablet 2  . feeding supplement, ENSURE ENLIVE, (ENSURE ENLIVE) LIQD Take 237 mLs by mouth 2 (two) times daily between meals. 237 mL 12  . gabapentin (NEURONTIN) 400 MG capsule Take 1 capsule (400 mg total) by mouth 2 (two) times daily. 60 capsule 0  . losartan (COZAAR) 25 MG tablet Take 1 tablet (25 mg total) by mouth daily. 30 tablet 0  . omeprazole (PRILOSEC) 20 MG capsule Take 20 mg by mouth daily.    . potassium chloride SA (K-DUR,KLOR-CON) 20 MEQ tablet Take 2 tablets (40 mEq total) by mouth daily. (Patient taking differently: Take 20 mEq by mouth daily. ) 60 tablet 2  . sertraline (ZOLOFT) 25 MG tablet Take 1 tablet (25 mg total) by mouth daily. Please use heart failure  fund 30 tablet 3  . sildenafil (REVATIO) 20 MG tablet Take 1 tablet (20 mg total) by mouth 3 (three) times daily. 90 tablet 0  . spironolactone (ALDACTONE) 25 MG tablet Take 0.5 tablets (12.5 mg total) by mouth daily. 30 tablet 2  . torsemide (DEMADEX) 20 MG tablet Take 1 tablet (20 mg total) by mouth daily. Take 1 tablet every other day    . warfarin (COUMADIN) 3 MG tablet Take 1 tablet (3 mg total) by mouth daily. 30 tablet 11   No current facility-administered medications for this encounter.     Norflex [orphenadrine]  Review of systems complete and found to be negative unless listed in HPI.    LVAD INTERROGATION:  Please see nurse's note above for more details.   I reviewed the LVAD parameters from today, and compared the results to the patient's prior recorded data.  No programming changes were made.  The LVAD is functioning within specified parameters.  The patient performs LVAD self-test daily.  LVAD interrogation was negative for any significant power changes, alarms or PI events/speed drops.  LVAD equipment check completed and is in good working order.  Back-up equipment present.   LVAD education done on emergency procedures and precautions and reviewed exit site care.   Wt Readings from Last 3 Encounters:  02/20/18 70.3 kg (155 lb)  02/13/18 69.8 kg (153 lb 12.8 oz)  02/10/18 63.7 kg (140 lb 6.9 oz)   Physical Exam: General: Well appearing this am. NAD.  HEENT: Normal. Neck: Supple, JVP 7-8 cm. Carotids OK.  Cardiac:  Mechanical heart sounds with LVAD hum present.  Lungs:  CTAB, normal effort.  Abdomen:  NT, ND, no HSM. No bruits or masses. +BS  LVAD exit site: Well-healed and incorporated. Dressing dry and intact. No erythema or drainage. Stabilization device present and accurately applied. Driveline dressing changed daily per sterile technique. Extremities:  Warm and dry. No cyanosis, clubbing, rash, or edema.  Neuro:  Alert & oriented x 3. Cranial nerves grossly intact.  Moves all 4 extremities w/o difficulty. Affect pleasant     ASSESSMENT AND PLAN:   1. Chronic Systolic CHF: S/p HM3 01/27/18. Nonischemic cardiomyopathy, diagnosed in 2011, initially followed at [facility]. LHC (3/19) with no significant CAD. CPX (3/19) submaximal, but suggestive of mild to moderate HF limitation. HIV negative 3/19, history of drug/ETOH abuse in the past, no drugs now and hadcut back ETOH considerably. Echo 9/19 showedEF 20% with severe LV dilation, severe MR. NYHA class IV symptoms. He has a wide LBBB. RHC was done in 9/19 showing low output, CI 1.4. He  was admitted to start milrinone, unable to wean off milrinone and remained on 0.25 mcg/kg/min at home. Medtronic ICD was placed, he has a His bundle lead but EP was unable to get an LV lead in place =>His bundle pacing does not narrow his QRS and did not help [place] wean him off milrinone. HM3 placed 01/27/18. Speed increased to 5700 02/05/18 with echo + RHC LVAD optimization. LVAD interrogation stable, no PI events. NYHA class II symptoms. Not volume overloaded on exam, and possibly dry with frequent PI events this week.  - Decrease torsemide 20 mg every Monday and Friday, with additional as needed. Will adjust potassium accordingly based on labs.  - Continue losartan 25 mg daily - Increase spiro to 25 mg daily.  -Continue warfarin with INR goal 2-2.5 and ASA 81.  INR 2.57 today.  - Continue Sildenafil 20 mg TID, added for evidence of RV failure with PAPI 1.85 on post-op RHC. - Has been referred to cardiac rehab at [facility]. 2. ETOH Abuse: - Encouraged complete cessation.   3. Tobacco abuse:  - Encouraged complete cessation.  4. Mitral regurgitation: Functional.  Decreased MR s/p LVAD placement.  5. VT:  No further VT.   - Tentatively will plan on stopping amiodarone at next visit.  6. Depression:  - Continue sertraline 25 mg daily, this can be titrated up as needed.  RTC 2 weeks. Sooner with symptoms.    Graciella Freer, PA-C  03/06/2018   Greater than 50% of the 40 minute visit was spent in counseling/coordination of care regarding disease state education, salt/fluid restriction, sliding scale diuretics, and medication compliance.

## 2018-03-06 NOTE — Patient Instructions (Signed)
1. Increase Aldactone to 25 mg daily. 2. Decrease Torsemide to Monday/Friday. May take extra dose on Wednesday if needed.  3. Advance dressing changes to twice weekly. 4. Return to VAD clinic in 2 weeks.

## 2018-03-06 NOTE — Progress Notes (Signed)
Patient presents for 1 week follow up in VAD Clinic today. Reports no problems with VAD equipment or concerns with drive line.  Pt says his mood is bette overall after starting Zoloft; says he "out of funk". Reports increased stamina, is walking daily. [facility] signed off yesterday, pt says he is not interested in Cardiac Rehab at this time, still waiting on paperwork for insurance/disability.    Pt is still drinking protein supplement twice daily. Reports very good appetite.   He admits to a "few" orthostatic dizzy spells. Pt has not taken am meds this visit.    Vital Signs:  Doppler Pressure 104 Automatc BP: 112/88 (105) HR:  86 SPO2: 99%  Weight:157.6 lb w/o eqt Last weight: 155 lbs Home weights: 149 - 151 lbs   VAD Indication: Destination Therapy    VAD interrogation & Equipment Management: Speed: 5700 Flow: 4.7 Power: 4.5 w    PI: 4.2  Alarms: none Events: 15 - >40  Fixed speed 5700 Low speed limit: 5400  Primary Controller:  Replace back up battery in 32 months. Back up controller:   Replace back up battery in 32 months.  Annual Equipment Maintenance on UBC/PM was performed on 01/2018.   I reviewed the LVAD parameters from today and compared the results to the patient's prior recorded data. LVAD interrogation was NEGATIVE for significant power changes, NEGATIVE for clinical alarms and STABLE for PI events/speed drops. No programming changes were made and pump is functioning within specified parameters. Pt is performing daily controller and system monitor self tests along with completing weekly and monthly maintenance for LVAD equipment.  LVAD equipment check completed and is in good working order. Back-up equipment present.  Exit Site Care: Drive line is being maintained every other day by Claris Che. Drive line exit site well healing and unincorporated. The velour is fully implanted at exit site. Dressing dry and intact. No erythema, small amount  of serous drainage. Stabilization device present and accurately applied. Pt denies fever or chills. Pt states they have adequate dressing supplies at home. Advance to twice weekly dressing changes.   Significant Events on VAD Support:    Device:Medtronic Therapies: on 200 bpm Monitor: on slow VT 176; AT 171 Last check: 01/14/18   BP & Labs:  MAP 104 - Doppler is reflecting MAP  Hgb 11.9 - No S/S of bleeding. Specifically denies melena/BRBPR or nosebleeds.  LDH stable at  217 with established baseline of 200-300. Denies tea-colored urine. No power elevations noted on interrogation.   Patient Instructions: 1. Increase Aldactone to 25 mg daily 2. Decrease Torsemide to Mon/Fri (may take extra dose on Wed if needed) 3. Advance dressing changes to twice weekly  4. Return to VAD clinic in 2 weeks.    Hessie Diener RN VAD Coordinator   Office: [phone] 24/7 Emergency VAD Pager: [phone]

## 2018-03-11 ENCOUNTER — Other Ambulatory Visit (HOSPITAL_COMMUNITY): Payer: Self-pay | Admitting: *Deleted

## 2018-03-11 DIAGNOSIS — Z95811 Presence of heart assist device: Secondary | ICD-10-CM

## 2018-03-11 DIAGNOSIS — Z7901 Long term (current) use of anticoagulants: Secondary | ICD-10-CM

## 2018-03-11 MED ORDER — WARFARIN SODIUM 3 MG PO TABS: 3.0000 mg | ORAL_TABLET | Freq: Every day | ORAL | 11 refills | Status: DC

## 2018-03-11 MED FILL — WARFARIN SODIUM 3 MG TABLET: 3 | 30 days supply | Qty: 30 | Fill #0

## 2018-03-19 ENCOUNTER — Telehealth (HOSPITAL_COMMUNITY): Payer: Self-pay | Admitting: *Deleted

## 2018-03-19 ENCOUNTER — Other Ambulatory Visit (HOSPITAL_COMMUNITY): Payer: Self-pay | Admitting: Unknown Physician Specialty

## 2018-03-19 DIAGNOSIS — Z95811 Presence of heart assist device: Secondary | ICD-10-CM

## 2018-03-19 DIAGNOSIS — Z7901 Long term (current) use of anticoagulants: Secondary | ICD-10-CM

## 2018-03-19 NOTE — Telephone Encounter (Signed)
Received a page at 0300 from Mr. Michalski stating that he had a bloody bowel movement. He states this is the first time that this has occurred. VAD numbers stable. Denied pain. Instructed patient to come to clinic this morning at 0900, but he said they would not have any gas money until Claris Che gets paid tonight. Will see him for his scheduled appointment Thursday at 0900.   0830: Per Dr. Shirlee Latch, instructed patient to hold ASA and Coumadin tonight. Patient verbalized understanding of this. He states that he has intermittent abdominal cramping, but has not had any further bloody stools. Instructed patient to page if bloody stools continue, or if pain gets worse. Patient verbalized understanding.   [name] RN VAD Coordinator  Office: [phone]  24/7 Pager: [phone]

## 2018-03-20 ENCOUNTER — Encounter (HOSPITAL_COMMUNITY): Payer: Self-pay

## 2018-03-20 ENCOUNTER — Ambulatory Visit (HOSPITAL_COMMUNITY): Payer: Self-pay | Admitting: Pharmacist

## 2018-03-20 ENCOUNTER — Ambulatory Visit (HOSPITAL_COMMUNITY)
Admission: RE | Admit: 2018-03-20 | Discharge: 2018-03-20 | Disposition: A | Discharge status: Home / Self Care | Payer: Medicaid Other | Source: Ambulatory Visit | Attending: Cardiology | Admitting: Cardiology

## 2018-03-20 VITALS — BP 110/83 | HR 91 | Ht 67.0 in | Wt 158.2 lb

## 2018-03-20 DIAGNOSIS — K625 Hemorrhage of anus and rectum: Secondary | ICD-10-CM | POA: Diagnosis not present

## 2018-03-20 DIAGNOSIS — I447 Left bundle-branch block, unspecified: Secondary | ICD-10-CM | POA: Diagnosis not present

## 2018-03-20 DIAGNOSIS — Z95811 Presence of heart assist device: Secondary | ICD-10-CM | POA: Diagnosis not present

## 2018-03-20 DIAGNOSIS — F329 Major depressive disorder, single episode, unspecified: Secondary | ICD-10-CM | POA: Diagnosis not present

## 2018-03-20 DIAGNOSIS — I472 Ventricular tachycardia, unspecified: Secondary | ICD-10-CM

## 2018-03-20 DIAGNOSIS — Z79899 Other long term (current) drug therapy: Secondary | ICD-10-CM | POA: Insufficient documentation

## 2018-03-20 DIAGNOSIS — Z7982 Long term (current) use of aspirin: Secondary | ICD-10-CM | POA: Insufficient documentation

## 2018-03-20 DIAGNOSIS — Z95810 Presence of automatic (implantable) cardiac defibrillator: Secondary | ICD-10-CM | POA: Insufficient documentation

## 2018-03-20 DIAGNOSIS — Z9889 Other specified postprocedural states: Secondary | ICD-10-CM | POA: Diagnosis not present

## 2018-03-20 DIAGNOSIS — I5022 Chronic systolic (congestive) heart failure: Secondary | ICD-10-CM | POA: Diagnosis not present

## 2018-03-20 DIAGNOSIS — I340 Nonrheumatic mitral (valve) insufficiency: Secondary | ICD-10-CM | POA: Diagnosis not present

## 2018-03-20 DIAGNOSIS — F1010 Alcohol abuse, uncomplicated: Secondary | ICD-10-CM | POA: Insufficient documentation

## 2018-03-20 DIAGNOSIS — I428 Other cardiomyopathies: Secondary | ICD-10-CM | POA: Diagnosis not present

## 2018-03-20 DIAGNOSIS — F17200 Nicotine dependence, unspecified, uncomplicated: Secondary | ICD-10-CM | POA: Diagnosis not present

## 2018-03-20 DIAGNOSIS — Z7901 Long term (current) use of anticoagulants: Secondary | ICD-10-CM | POA: Insufficient documentation

## 2018-03-20 LAB — BASIC METABOLIC PANEL
Anion gap: 8 (ref 5–15)
BUN: 5 mg/dL — ABNORMAL LOW (ref 6–20)
CO2: 24 mmol/L (ref 22–32)
CREATININE: 0.77 mg/dL (ref 0.61–1.24)
Calcium: 9.5 mg/dL (ref 8.9–10.3)
Chloride: 105 mmol/L (ref 98–111)
GFR calc Af Amer: >60 mL/min (ref >60)
GLUCOSE: 67 mg/dL — AB (ref 70–99)
Potassium: 4.1 mmol/L (ref 3.5–5.1)
SODIUM: 137 mmol/L (ref 135–145)

## 2018-03-20 LAB — PROTIME-INR
INR: 3.25
Prothrombin Time: 32.7 seconds — ABNORMAL HIGH (ref 11.4–15.2)

## 2018-03-20 LAB — CBC
HEMATOCRIT: 42.2 % (ref 39.0–52.0)
HEMOGLOBIN: 14 g/dL (ref 13.0–17.0)
MCH: 31.5 pg (ref 26.0–34.0)
MCHC: 33.2 g/dL (ref 30.0–36.0)
MCV: 94.8 fL (ref 80.0–100.0)
PLATELETS: 177 10*3/uL (ref 150–400)
RBC: 4.45 MIL/uL (ref 4.22–5.81)
RDW: 16.3 % — AB (ref 11.5–15.5)
WBC: 7.3 10*3/uL (ref 4.0–10.5)
nRBC: 0 % (ref 0.0–0.2)

## 2018-03-20 LAB — LACTATE DEHYDROGENASE: LDH: 213 U/L — ABNORMAL HIGH (ref 98–192)

## 2018-03-20 MED ORDER — POTASSIUM CHLORIDE CRYS ER 20 MEQ PO TBCR: 40.0000 meq | EXTENDED_RELEASE_TABLET | Freq: Every day | ORAL | 2 refills | Status: DC

## 2018-03-20 MED ORDER — SERTRALINE HCL 25 MG PO TABS: 25.0000 mg | ORAL_TABLET | Freq: Every day | ORAL | 3 refills | Status: DC

## 2018-03-20 MED FILL — POTASSIUM CHLORIDE CRYS ER: 20 | 30 days supply | Qty: 60 | Fill #0

## 2018-03-20 MED FILL — SERTRALINE HCL 25 MG TABLET: 25 | 30 days supply | Qty: 30 | Fill #0

## 2018-03-20 NOTE — Progress Notes (Signed)
CSW met with patient in the clinic to complete a PHQ 9 due to positive score on last admission. Patient scored a 10 on the PHQ 9 today and was prescribed Zoloft a few weeks back and reports improved mood. He stated "I am out of the funk that I was in".  Patient stated he is still adjusting to VAD life and the inability to work and feel financially independent. He stated that he his disability is still in the works between [employer] and [employer]. Patient states "I am just waiting for then to hit the pay button". CSW provided support and will continue to be available as needed. Raquel Sarna, [place], [place]

## 2018-03-20 NOTE — Patient Instructions (Signed)
1. Stop Amiodarone. 2. Resume Aspirin. 3. Hold Coumadin tonight and then resume a dose of 3 mg daily. 4. Go to [facility] next Thursday for INR check. 5. Return to clinic in 1 month.

## 2018-03-20 NOTE — Progress Notes (Signed)
Patient presents for his 2 week follow up in VAD Clinic today. Reports no problems with VAD equipment or concerns with drive line.  Pt says his mood is better overall after starting Zoloft and is continuing to improve.  Pt is still drinking protein supplement twice daily. Reports very good appetite.   He admits to a "few" orthostatic dizzy spells. Pt has not taken am meds this visit.    Vital Signs:  Doppler Pressure 90 Automatc BP: 100/83 (100) HR:  91 SPO2: UTO%  Weight:158.2 lb w/o eqt Last weight: 157.6 lbs Home weights: 149 - 151 lbs   VAD Indication: Destination Therapy    VAD interrogation & Equipment Management: Speed: 5700 Flow: 4.9 Power: 4.7 w    PI: 4.2  Alarms: Few LV Events: 10-20  Fixed speed 5700 Low speed limit: 5400  Primary Controller:  Replace back up battery in 31 months. Back up controller:   Replace back up battery in 31 months.  Annual Equipment Maintenance on UBC/PM was performed on 01/2018.   I reviewed the LVAD parameters from today and compared the results to the patient's prior recorded data. LVAD interrogation was NEGATIVE for significant power changes, NEGATIVE for clinical alarms and STABLE for PI events/speed drops. No programming changes were made and pump is functioning within specified parameters. Pt is performing daily controller and system monitor self tests along with completing weekly and monthly maintenance for LVAD equipment.  LVAD equipment check completed and is in good working order. Back-up equipment present.  Exit Site Care: Drive line is being maintained every other day by Claris Che. Drive line exit site well healing and unincorporated. The velour is fully implanted at exit site. Dressing dry and intact. No erythema, small amount of serous drainage. Stabilization device present and accurately applied. Pt denies fever or chills. Pt states they have adequate dressing supplies at home. Advance to twice weekly dressing  changes.   Significant Events on VAD Support:    Device:Medtronic Therapies: on 200 bpm Monitor: on slow VT 176; AT 171 Last check: 01/14/18   BP & Labs:  MAP 90 - Doppler is reflecting MAP  Hgb 14 - No S/S of bleeding. Specifically denies melena/BRBPR or nosebleeds.  LDH stable at  with established baseline of 200-300. Denies tea-colored urine. No power elevations noted on interrogation.   Patient Instructions: 1. Stop Amiodarone. 2. Resume Aspirin. 3. Hold Coumadin tonight and then resume a dose of 3 mg daily. 4. Go to [facility] next Thursday for INR check. 5. Return to clinic in 1 month.   Carlton Adam RN VAD Coordinator   Office: [phone] 24/7 Emergency VAD Pager: [phone]   PCP: Julio Alm  HF MD:  Dr Shirlee Latch  Cardiology: Dr Tomie China.   HPI: GUSSIE ODOMS is a 52 y.o. male with a history of chronic systolic HF previously on home milrinone, NICM s/p Medtronic ICD, chronic LBBB, tobacco abuse, ETOH abuse, substance abuse, hx of device plaments with extractions due to infection, and mitral regurgitation. S/P HMIII 01/27/2018.   He has long complicated history of CHF.  Care was initially in [place] and at [facility].  Previously had EF down to 30% in 2011 at initial diagnosis.  In 4/13, he got a single chamber ICD.  In 9/13, he had implantation of a subcutaneous array for high DFTs while on amiodarone for VT.  Echo in 11/15 with EF 30%.  In 11/15, single chamber ICD, leads, and subcutaneous array all removed due to infection.  Discharged with  [employer]. In 1/16, he had subcutaneous ICD placed.  In 9/16, this was explanted due to erosion.  EF on echo in 9/16 was 45%.  Echo in 11/17 showed EF 50%.    Admitted to [facility] 2/28 - 06/17/17 with SOB, orthopnea, and PND. He was off medicines for about 6 weeks prior due to insurance issues. Repeat Echo (3/19) significant for fall in EF to 15-20% with severe LV dilation and severe diffuse hypokinesis, moderate-severe MR.    Followed up with Dr. Tomie China 06/24/17. Feeling better at this appointment. Referred for LHC and HF team. Dr Excell Seltzer performed [facility] March 2019. No significant CAD.  CPX 3/19 showed peak VO2 24 (68% predicted) with VE/VCO2 slope 37 with RER 0.91 (submaximal).  Probably mild to moderate functional impairment.   He was admitted for LVAD work up. Underwent HM3 implantation on 01/27/18. 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  He returns for followup of LVAD.  Mood has been doing better on sertraline.  No significant exertional dyspnea, exercise tolerance much better with LVAD.  He noted some red blood on toilet tissue yesterday, no repetition.  Hgb today is higher at 14.  MAP 90 but he does report occasional dizziness with standing.  Systolic BP drops with standing though MAP remains stable.  Occasional loose stool.  Denies LVAD alarms.  Denies driveline trauma, erythema or drainage.  Denies ICD shocks.   Reports taking Coumadin as prescribed and adherence to anticoagulation based dietary restrictions.  No dark urine or hematuria.    Labs (12/19): hgb 14, INR 3.2  PMH: 1. Active smoker.  2. Chronic systolic CHF: Nonischemic cardiomyopathy, diagnosed in 2011. HIV negative.  History of drug/ETOH abuse, now stopped.  - Echo 2011: EF 30%.  - In 4/13, he got a single chamber ICD.  In 9/13, he had implantation of a subcutaneous array for high DFTs while on amiodarone for VT.  - Echo 11/15: EF 30%.  - In 11/15, single chamber ICD, leads, and subcutaneous array all removed due to infection. - In 1/16, he had subcutaneous ICD placed.  In 9/16, this was explanted due to erosion. - Echo 9/16: EF 45% - Echo 11/17: EF 50% - Echo (3/19): EF 15-20% with severe LV dilation and severe diffuse hypokinesis, moderate-severe MR.  - LHC (3/19): No significant coronary disease.  - CPX (3/19): Peak VO2 24 (68% predicted) with VE/VCO2 slope 37 with RER 0.91 (submaximal).  Probably mild to moderate functional  impairment.  - Echo (9/19):  EF is around 20% with severe dilation, severe central mitral regurgitation. Severe LAE. - RHC (9/19): mean RA 3, PA 41/20 mean 30, mean PCWP 16, CI 1.4, PVR 5.6 WU.  - TEE (9/19): Severe LV dilation > 7 cm, EF 15-20%, dyssynchrony noted, mildly decreased RV systolic function, severe functional MR.  - Medtronic ICD with His bundle pacing 9/19, unable to place LV lead.  - HM3 LVAD 10/19 3. LBBB 4. Mitral regurgitation: Severe on 9/19 echo. Suspect primarily functional.  5. Depression   Current Outpatient Medications  Medication Sig Dispense Refill  . acetaminophen (TYLENOL) 500 MG tablet Take 500 mg by mouth every 6 (six) hours as needed for mild pain or fever.     Marland Kitchen aspirin EC 81 MG tablet Take 1 tablet (81 mg total) by mouth daily. 30 tablet 2  . feeding supplement, ENSURE ENLIVE, (ENSURE ENLIVE) LIQD Take 237 mLs by mouth 2 (two) times daily between meals. 237 mL 12  . gabapentin (NEURONTIN) 400 MG capsule  Take 1 capsule (400 mg total) by mouth 2 (two) times daily. 60 capsule 0  . losartan (COZAAR) 25 MG tablet Take 1 tablet (25 mg total) by mouth daily. 30 tablet 0  . omeprazole (PRILOSEC) 20 MG capsule Take 1 capsule (20 mg total) by mouth daily. 30 capsule 0  . potassium chloride SA (K-DUR,KLOR-CON) 20 MEQ tablet Take 2 tablets (40 mEq total) by mouth daily. 60 tablet 2  . sertraline (ZOLOFT) 25 MG tablet Take 1 tablet (25 mg total) by mouth daily. Please use heart failure fund 30 tablet 3  . sildenafil (REVATIO) 20 MG tablet Take 1 tablet (20 mg total) by mouth 3 (three) times daily. 90 tablet 0  . spironolactone (ALDACTONE) 25 MG tablet Take 1 tablet (25 mg total) by mouth daily. 30 tablet 2  . torsemide (DEMADEX) 20 MG tablet Take 1 tablet every Mon and Friday; or as needed 30 tablet 0  . warfarin (COUMADIN) 3 MG tablet Take 1 tablet (3 mg total) by mouth daily. 30 tablet 11  . albuterol (PROVENTIL HFA;VENTOLIN HFA) 108 (90 Base) MCG/ACT inhaler Inhale 2  puffs into the lungs every 6 (six) hours as needed for wheezing or shortness of breath.     No current facility-administered medications for this encounter.     Norflex [orphenadrine]  REVIEW OF SYSTEMS: All systems negative except as listed in HPI, PMH and Problem list.   LVAD INTERROGATION:  See nurse's note above for details.   I reviewed the LVAD parameters from today, and compared the results to the patient's prior recorded data.  No programming changes were made.  The LVAD is functioning within specified parameters.  The patient performs LVAD self-test daily.  LVAD interrogation was negative for any significant power changes, alarms or PI events/speed drops.  LVAD equipment check completed and is in good working order.  Back-up equipment present.   LVAD education done on emergency procedures and precautions and reviewed exit site care.    Vitals:   03/20/18 1001 03/20/18 1002  BP: (!) 90/0 110/83  Pulse: 91   Weight: 71.8 kg (158 lb 3.2 oz)   Height: 5\' 7"  (1.702 m)    Wt Readings from Last 3 Encounters:  03/20/18 71.8 kg (158 lb 3.2 oz)  03/06/18 71.5 kg (157 lb 9.6 oz)  02/20/18 70.3 kg (155 lb)   Physical Exam: General: Well appearing this am. NAD.  HEENT: Normal. Neck: Supple, JVP 7-8 cm. Carotids OK.  Cardiac:  Mechanical heart sounds with LVAD hum present.  Lungs:  CTAB, normal effort.  Abdomen:  NT, ND, no HSM. No bruits or masses. +BS  LVAD exit site: Well-healed and incorporated. Dressing dry and intact. No erythema or drainage. Stabilization device present and accurately applied. Driveline dressing changed daily per sterile technique. Extremities:  Warm and dry. No cyanosis, clubbing, rash, or edema.  Neuro:  Alert & oriented x 3. Cranial nerves grossly intact. Moves all 4 extremities w/o difficulty. Affect pleasant    ASSESSMENT AND PLAN:   1. Chronic Systolic CHF: S/p HM3 01/27/18. Nonischemic cardiomyopathy, diagnosed in 2011, initially followed at  [facility]. LHC (3/19) with no significant CAD. CPX (3/19) submaximal, but suggestive of mild to moderate HF limitation. HIV negative 3/19, history of drug/ETOH abuse in the past, no drugs now and hadcut back ETOH considerably. Echo 9/19 showedEF 20% with severe LV dilation, severe MR. NYHA class IV symptoms. He has a wide LBBB. RHC was done in 9/19 showing low output, CI 1.4. He was  admitted to start milrinone, unable to wean off milrinone and remained on 0.25 mcg/kg/min at home. Medtronic ICD was placed, he has a His bundle lead but EP was unable to get an LV lead in place =>His bundle pacing does not narrow his QRS and did not help [place] wean him off milrinone. HM3 placed 01/27/18. Speed increased to 5700 02/05/18 with echo + RHC LVAD optimization. LVAD interrogation stable, few PI events. NYHA class I-II symptoms. He is not volume overloaded on exam. MAP 90, will accept this given occasional orthostatic-type symptoms. - Can continue to use torsemide only prn.   - Continue losartan 25 mg daily - Continue spiro 25 mg daily.  -Continue warfarin with INR goal 2-2.5 and ASA 81.  Pending INR today.  - Continue Sildenafil 20 mg TID, added for evidence of RV failure with PAPI 1.85 on post-op RHC. - He is walking for exercise, cannot do cardiac rehab until he gets Medicaid.  2. ETOH Abuse:Only occasional ETOH now.  3. Tobacco abuse: He has cut back, needs to stop completely.  4. Mitral regurgitation: Functional.  Decreased MR s/p LVAD placement.  5. VT:  No further VT.   -  Do not want him to continue long-term, will have him stop amiodarone today.  He has followup with EP.  6. Depression: Doing better on sertraline.  7. Rectal bleeding: Episode yesterday sounds like hemorrhoidal bleeding.  Can hold aspirin for a couple of days and will adjust warfarin (INR supratherapeutic).   Marca Ancona 03/20/2018

## 2018-03-25 ENCOUNTER — Encounter (HOSPITAL_COMMUNITY): Payer: Self-pay | Admitting: Unknown Physician Specialty

## 2018-03-27 ENCOUNTER — Encounter (HOSPITAL_COMMUNITY)
Admission: RE | Admit: 2018-03-27 | Discharge: 2018-03-27 | Disposition: A | Discharge status: Home / Self Care | Payer: Medicaid Other | Source: Ambulatory Visit | Attending: Cardiology | Admitting: Cardiology

## 2018-03-27 ENCOUNTER — Ambulatory Visit (HOSPITAL_COMMUNITY): Payer: Self-pay | Admitting: Pharmacist

## 2018-03-27 DIAGNOSIS — Z95811 Presence of heart assist device: Secondary | ICD-10-CM

## 2018-03-27 LAB — PROTIME-INR
INR: 1.45
Prothrombin Time: 17.5 seconds — ABNORMAL HIGH (ref 11.4–15.2)

## 2018-03-27 MED ORDER — ENOXAPARIN SODIUM 30 MG/0.3ML ~~LOC~~ SOLN: 30.0000 mg | Freq: Two times a day (BID) | SUBCUTANEOUS | 0 refills | Status: DC

## 2018-03-27 MED FILL — ENOXAPARIN 30 MG/0.3 ML SYR: 30 | 5 days supply | Qty: 3 | Fill #0

## 2018-04-01 ENCOUNTER — Telehealth (HOSPITAL_COMMUNITY): Payer: Self-pay | Admitting: Pharmacist

## 2018-04-01 NOTE — Telephone Encounter (Signed)
Carl Ferguson will get INR checked at [facility] tomorrow

## 2018-04-02 ENCOUNTER — Other Ambulatory Visit (HOSPITAL_COMMUNITY)
Admission: RE | Admit: 2018-04-02 | Discharge: 2018-04-02 | Disposition: A | Discharge status: Home / Self Care | Payer: Medicaid Other | Source: Ambulatory Visit | Attending: Cardiology | Admitting: Cardiology

## 2018-04-02 ENCOUNTER — Telehealth (HOSPITAL_COMMUNITY): Payer: Self-pay | Admitting: *Deleted

## 2018-04-02 ENCOUNTER — Ambulatory Visit (HOSPITAL_COMMUNITY): Payer: Self-pay | Admitting: Pharmacist

## 2018-04-02 DIAGNOSIS — Z7901 Long term (current) use of anticoagulants: Secondary | ICD-10-CM | POA: Diagnosis present

## 2018-04-02 DIAGNOSIS — Z95811 Presence of heart assist device: Secondary | ICD-10-CM | POA: Insufficient documentation

## 2018-04-02 LAB — PROTIME-INR
INR: 2.33
Prothrombin Time: 25.2 seconds — ABNORMAL HIGH (ref 11.4–15.2)

## 2018-04-02 NOTE — Telephone Encounter (Signed)
Spoke with Carl Ferguson and Carl Ferguson regarding their travel plans. They are traveling up to [place] to visit family for 3 days next week. Below is the address of the family they are staying with:    [address]   [address]   [place] [address]  Provided patient with closest VAD center information:    [facility]   [address]   [place], [place] [address]  Phone #: [phone]  Instructed patient to page VAD pager if he has to go to the hospital. Carl Ferguson and Carl Ferguson verbalized understanding.  [name] RN VAD Coordinator  Office: [phone]  24/7 Pager: [phone]

## 2018-04-08 ENCOUNTER — Other Ambulatory Visit (HOSPITAL_COMMUNITY): Payer: Self-pay | Admitting: Cardiology

## 2018-04-08 DIAGNOSIS — I428 Other cardiomyopathies: Secondary | ICD-10-CM

## 2018-04-08 DIAGNOSIS — I5022 Chronic systolic (congestive) heart failure: Secondary | ICD-10-CM

## 2018-04-08 DIAGNOSIS — Z95811 Presence of heart assist device: Secondary | ICD-10-CM

## 2018-04-08 MED FILL — SPIRONOLACTONE 25 MG TABLET: 25 | 30 days supply | Qty: 30 | Fill #1

## 2018-04-08 MED FILL — WARFARIN SODIUM 3 MG TABLET: 3 | 30 days supply | Qty: 30 | Fill #1

## 2018-04-11 ENCOUNTER — Other Ambulatory Visit (HOSPITAL_COMMUNITY): Payer: Self-pay | Admitting: *Deleted

## 2018-04-11 DIAGNOSIS — I5022 Chronic systolic (congestive) heart failure: Secondary | ICD-10-CM

## 2018-04-11 DIAGNOSIS — Z95811 Presence of heart assist device: Secondary | ICD-10-CM

## 2018-04-11 DIAGNOSIS — M792 Neuralgia and neuritis, unspecified: Secondary | ICD-10-CM

## 2018-04-11 DIAGNOSIS — I428 Other cardiomyopathies: Secondary | ICD-10-CM

## 2018-04-11 MED ORDER — GABAPENTIN 400 MG PO CAPS: 400.0000 mg | ORAL_CAPSULE | Freq: Two times a day (BID) | ORAL | 5 refills | Status: DC

## 2018-04-11 MED ORDER — LOSARTAN POTASSIUM 25 MG PO TABS: 25.0000 mg | ORAL_TABLET | Freq: Every day | ORAL | 11 refills | Status: DC

## 2018-04-11 MED ORDER — POTASSIUM CHLORIDE CRYS ER 20 MEQ PO TBCR: 40.0000 meq | EXTENDED_RELEASE_TABLET | Freq: Every day | ORAL | 11 refills | Status: DC

## 2018-04-11 MED FILL — LOSARTAN POTASSIUM 25 MG TA: 25 | 30 days supply | Qty: 30 | Fill #0

## 2018-04-11 MED FILL — POTASSIUM CHLORIDE CRYS ER: 20 | 30 days supply | Qty: 60 | Fill #0

## 2018-04-11 MED FILL — GABAPENTIN 400 MG CAPSULE: 400 | 30 days supply | Qty: 60 | Fill #0

## 2018-04-14 ENCOUNTER — Encounter: Payer: Self-pay | Admitting: Internal Medicine

## 2018-04-14 ENCOUNTER — Ambulatory Visit (HOSPITAL_COMMUNITY): Payer: Self-pay | Admitting: Pharmacist

## 2018-04-14 ENCOUNTER — Ambulatory Visit (INDEPENDENT_AMBULATORY_CARE_PROVIDER_SITE_OTHER): Payer: Medicaid Other | Admitting: Internal Medicine

## 2018-04-14 ENCOUNTER — Other Ambulatory Visit (HOSPITAL_COMMUNITY): Payer: Self-pay | Admitting: *Deleted

## 2018-04-14 ENCOUNTER — Ambulatory Visit (HOSPITAL_COMMUNITY)
Admission: RE | Admit: 2018-04-14 | Discharge: 2018-04-14 | Disposition: A | Discharge status: Home / Self Care | Payer: Medicaid Other | Source: Ambulatory Visit | Attending: Cardiology | Admitting: Cardiology

## 2018-04-14 VITALS — BP 102/0 | Ht 67.0 in | Wt 163.0 lb

## 2018-04-14 DIAGNOSIS — Z7901 Long term (current) use of anticoagulants: Secondary | ICD-10-CM | POA: Diagnosis not present

## 2018-04-14 DIAGNOSIS — I712 Thoracic aortic aneurysm, without rupture, unspecified: Secondary | ICD-10-CM | POA: Insufficient documentation

## 2018-04-14 DIAGNOSIS — Z95810 Presence of automatic (implantable) cardiac defibrillator: Secondary | ICD-10-CM

## 2018-04-14 DIAGNOSIS — I5022 Chronic systolic (congestive) heart failure: Secondary | ICD-10-CM

## 2018-04-14 DIAGNOSIS — I447 Left bundle-branch block, unspecified: Secondary | ICD-10-CM | POA: Diagnosis not present

## 2018-04-14 DIAGNOSIS — Z95811 Presence of heart assist device: Secondary | ICD-10-CM | POA: Diagnosis not present

## 2018-04-14 DIAGNOSIS — I428 Other cardiomyopathies: Secondary | ICD-10-CM

## 2018-04-14 LAB — CUP PACEART INCLINIC DEVICE CHECK
Battery Remaining Longevity: 87 mo
Battery Voltage: 2.98 V
Brady Statistic AP VP Percent: 0.01 %
Brady Statistic AP VS Percent: 0 %
Brady Statistic AS VP Percent: 98.21 %
Brady Statistic RA Percent Paced: 0.01 %
Brady Statistic RV Percent Paced: 97.66 %
Date Time Interrogation Session: 20191230151703
HighPow Impedance: 55 Ohm
Implantable Lead Implant Date: 20190919
Implantable Lead Implant Date: 20190919
Implantable Lead Location: 753858
Implantable Lead Location: 753859
Implantable Lead Location: 753860
Implantable Lead Model: 3830
Implantable Lead Model: 5076
Implantable Pulse Generator Implant Date: 20190919
Lead Channel Impedance Value: 285 Ohm
Lead Channel Impedance Value: 304 Ohm
Lead Channel Impedance Value: 399 Ohm
Lead Channel Impedance Value: 399 Ohm
Lead Channel Impedance Value: 532 Ohm
Lead Channel Pacing Threshold Amplitude: 0.5 V
Lead Channel Pacing Threshold Amplitude: 0.875 V
Lead Channel Pacing Threshold Amplitude: 2.125 V
Lead Channel Pacing Threshold Pulse Width: 0.4 ms
Lead Channel Pacing Threshold Pulse Width: 0.4 ms
Lead Channel Sensing Intrinsic Amplitude: 3.125 mV
Lead Channel Sensing Intrinsic Amplitude: 4.375 mV
Lead Channel Sensing Intrinsic Amplitude: 5.5 mV
Lead Channel Sensing Intrinsic Amplitude: 6.25 mV
Lead Channel Setting Pacing Amplitude: 2 V
Lead Channel Setting Pacing Amplitude: 2 V
Lead Channel Setting Pacing Amplitude: 3 V
Lead Channel Setting Pacing Pulse Width: 0.4 ms
Lead Channel Setting Sensing Sensitivity: 0.3 mV
MDC IDC LEAD IMPLANT DT: 20190919
MDC IDC MSMT LEADCHNL LV PACING THRESHOLD PULSEWIDTH: 0.4 ms
MDC IDC MSMT LEADCHNL RV IMPEDANCE VALUE: 285 Ohm
MDC IDC SET LEADCHNL LV PACING PULSEWIDTH: 0.4 ms
MDC IDC STAT BRADY AS VS PERCENT: 1.78 %

## 2018-04-14 LAB — PROTIME-INR
INR: 1.42
Prothrombin Time: 17.2 seconds — ABNORMAL HIGH (ref 11.4–15.2)

## 2018-04-14 NOTE — Progress Notes (Signed)
HPI Carl Ferguson returns today for followup. He has an endstage CM, underwent biv upgrade and then insertion of an LVAD He was hospitalized for several weeks. He returns today for followup and in the interim he has done well.  Allergies  Allergen Reactions  . Norflex [Orphenadrine] Swelling    FACE [ANGIOEDEMA]     Current Outpatient Medications  Medication Sig Dispense Refill  . acetaminophen (TYLENOL) 500 MG tablet Take 500 mg by mouth every 6 (six) hours as needed for mild pain or fever.     Marland Kitchen albuterol (PROVENTIL HFA;VENTOLIN HFA) 108 (90 Base) MCG/ACT inhaler Inhale 2 puffs into the lungs every 6 (six) hours as needed for wheezing or shortness of breath.    Marland Kitchen aspirin EC 81 MG tablet Take 1 tablet (81 mg total) by mouth daily. 30 tablet 2  . enoxaparin (LOVENOX) 30 MG/0.3ML injection Inject 0.3 mLs (30 mg total) into the skin every 12 (twelve) hours. 10 Syringe 0  . feeding supplement, ENSURE ENLIVE, (ENSURE ENLIVE) LIQD Take 237 mLs by mouth 2 (two) times daily between meals. 237 mL 12  . gabapentin (NEURONTIN) 400 MG capsule Take 1 capsule (400 mg total) by mouth 2 (two) times daily. 60 capsule 5  . losartan (COZAAR) 25 MG tablet Take 1 tablet (25 mg total) by mouth daily. 30 tablet 11  . omeprazole (PRILOSEC) 20 MG capsule Take 1 capsule (20 mg total) by mouth daily. 30 capsule 0  . potassium chloride SA (K-DUR,KLOR-CON) 20 MEQ tablet Take 2 tablets (40 mEq total) by mouth daily. 60 tablet 11  . sertraline (ZOLOFT) 25 MG tablet Take 1 tablet (25 mg total) by mouth daily. Please use heart failure fund 30 tablet 3  . sildenafil (REVATIO) 20 MG tablet Take 1 tablet (20 mg total) by mouth 3 (three) times daily. 90 tablet 0  . spironolactone (ALDACTONE) 25 MG tablet Take 1 tablet (25 mg total) by mouth daily. 30 tablet 2  . torsemide (DEMADEX) 20 MG tablet Take 1 tablet every Mon and Friday; or as needed 30 tablet 0  . warfarin (COUMADIN) 3 MG tablet Take 1 tablet (3 mg total) by  mouth daily. 30 tablet 11   No current facility-administered medications for this visit.      Past Medical History:  Diagnosis Date  . AICD (automatic cardioverter/defibrillator) present   . CHF (congestive heart failure) (HCC)   . COPD (chronic obstructive pulmonary disease) (HCC)   . Depression   . Dyspnea   . GERD (gastroesophageal reflux disease)   . Nonischemic cardiomyopathy (HCC)    a. diagnosed in 2011 with EF 30% at that time b. EF improved to 50% by echo in 02/2016  . Pneumonia   . Tachycardia     ROS:   All systems reviewed and negative except as noted in the HPI.   Past Surgical History:  Procedure Laterality Date  . BIV ICD INSERTION CRT-D N/A 01/02/2018   Procedure: BIV ICD INSERTION CRT-D;  Surgeon: Marinus Maw, MD;  Location: [facility] INVASIVE CV LAB;  Service: Cardiovascular;  Laterality: N/A;  . ICD GENERATOR REMOVAL  2017  . INSERTION OF IMPLANTABLE LEFT VENTRICULAR ASSIST DEVICE N/A 01/27/2018   Procedure: INSERTION OF IMPLANTABLE LEFT VENTRICULAR ASSIST DEVICE HM3;  Surgeon: Kerin Perna, MD;  Location: [facility] OR;  Service: Open Heart Surgery;  Laterality: N/A;  . LEFT HEART CATH AND CORONARY ANGIOGRAPHY N/A 06/26/2017   Procedure: LEFT HEART CATH AND CORONARY ANGIOGRAPHY;  Surgeon:  [name], MD;  Location: [facility] INVASIVE CV LAB;  Service: Cardiovascular;  Laterality: N/A;  . MULTIPLE EXTRACTIONS WITH ALVEOLOPLASTY N/A 01/20/2018   Procedure: Extraction of tooth #'s 2 and 29 with alveoloplasty and gross debridement of remaining dentition;  Surgeon: Charlynne Pander, DDS;  Location: [facility] OR;  Service: Oral Surgery;  Laterality: N/A;  . RIGHT HEART CATH N/A 12/30/2017   Procedure: RIGHT HEART CATH;  Surgeon: Laurey Morale, MD;  Location: [facility] INVASIVE CV LAB;  Service: Cardiovascular;  Laterality: N/A;  . RIGHT HEART CATH N/A 01/24/2018   Procedure: RIGHT HEART CATH - SWAN;  Surgeon: Laurey Morale, MD;  Location: [facility] INVASIVE CV LAB;  Service:  Cardiovascular;  Laterality: N/A;  . RIGHT HEART CATH N/A 02/05/2018   Procedure: RIGHT HEART CATH;  Surgeon: Laurey Morale, MD;  Location: [facility] INVASIVE CV LAB;  Service: Cardiovascular;  Laterality: N/A;  . TEE WITHOUT CARDIOVERSION N/A 01/08/2018   Procedure: TRANSESOPHAGEAL ECHOCARDIOGRAM (TEE);  Surgeon: Laurey Morale, MD;  Location: [facility] ENDOSCOPY;  Service: Cardiovascular;  Laterality: N/A;  . TEE WITHOUT CARDIOVERSION N/A 01/27/2018   Procedure: TRANSESOPHAGEAL ECHOCARDIOGRAM (TEE);  Surgeon: Donata Clay, Theron Arista, MD;  Location: [facility] OR;  Service: Open Heart Surgery;  Laterality: N/A;     Family History  Problem Relation Age of Onset  . Heart attack Mother   . Sudden Cardiac Death Neg Hx      Social History   Socioeconomic History  . Marital status: Significant Other    Spouse name: Not on file  . Number of children: Not on file  . Years of education: Not on file  . Highest education level: High school graduate  Occupational History  . Not on file  Social Needs  . Financial resource strain: Very hard  . Food insecurity:    Worry: Never true    Inability: Never true  . Transportation needs:    Medical: No    Non-medical: No  Tobacco Use  . Smoking status: Former Smoker    Packs/day: 0.50    Years: 33.00    Pack years: 16.50    Types: Cigarettes    Last attempt to quit: 12/29/2017    Years since quitting: 0.2  . Smokeless tobacco: Never Used  Substance and Sexual Activity  . Alcohol use: Yes    Frequency: Never    Comment: 3-4 beers daily.   . Drug use: Not Currently    Types: Cocaine    Comment: last use approximately 8 months ago per patient  . Sexual activity: Not Currently  Lifestyle  . Physical activity:    Days per week: Not on file    Minutes per session: Not on file  . Stress: Not on file  Relationships  . Social connections:    Talks on phone: Not on file    Gets together: Not on file    Attends religious service: Not on file    Active member of  club or organization: Not on file    Attends meetings of clubs or organizations: Not on file    Relationship status: Not on file  . Intimate partner violence:    Fear of current or ex partner: Not on file    Emotionally abused: Not on file    Physically abused: Not on file    Forced sexual activity: Not on file  Other Topics Concern  . Not on file  Social History Narrative  . Not on file     BP Marland Kitchen)  102/0   Ht 5\' 7"  (1.702 m)   Wt 163 lb (73.9 kg)   BMI 25.53 kg/m   Physical Exam:  Well appearing NAD HEENT: Unremarkable Neck:  No JVD, no thyromegally Lymphatics:  No adenopathy Back:  No CVA tenderness; I have examined his back and he has no evidence of any burning or radiation induced dermatitis. Lungs:  Clear with no wheezes HEART:  Regular rate rhythm, no murmurs, no rubs, no clicks Abd:  soft, positive bowel sounds, no organomegally, no rebound, no guarding Ext:  2 plus pulses, no edema, no cyanosis, no clubbing Skin:  No rashes no nodules Neuro:  CN II through XII intact, motor grossly intact  DEVICE  Normal device function.  See PaceArt for details.   Assess/Plan: 1. ICD - His medtronic device is working normally. We have turned down his outputs.  2. Chronic systolic heart failure - he is s/p LVAD and his symptoms are now class 1.   Carl Ferguson,M.D

## 2018-04-14 NOTE — Patient Instructions (Addendum)
Medication Instructions:  Your physician recommends that you continue on your current medications as directed. Please refer to the Current Medication list given to you today.  Labwork: None ordered.  Testing/Procedures: None ordered.  Follow-Up: Your physician wants you to follow-up in: 9 months with Dr. Ladona Ridgel. You will receive a reminder letter in the mail two months in advance. If you don't receive a letter, please call our office to schedule the follow-up appointment.  Remote monitoring is used to monitor your ICD from home. This monitoring reduces the number of office visits required to check your device to one time per year. It allows [place] to keep an eye on the functioning of your device to ensure it is working properly. You are scheduled for a device check from home on 07/14/2018. You may send your transmission at any time that day. If you have a wireless device, the transmission will be sent automatically. After your physician reviews your transmission, you will receive a postcard with your next transmission date.  Any Other Special Instructions Will Be Listed Below (If Applicable).  If you need a refill on your cardiac medications before your next appointment, please call your pharmacy.

## 2018-04-15 ENCOUNTER — Other Ambulatory Visit (HOSPITAL_COMMUNITY): Payer: Self-pay | Admitting: Cardiology

## 2018-04-15 DIAGNOSIS — I428 Other cardiomyopathies: Secondary | ICD-10-CM

## 2018-04-15 DIAGNOSIS — I5022 Chronic systolic (congestive) heart failure: Secondary | ICD-10-CM

## 2018-04-15 DIAGNOSIS — Z95811 Presence of heart assist device: Secondary | ICD-10-CM

## 2018-04-17 ENCOUNTER — Telehealth (HOSPITAL_COMMUNITY): Payer: Self-pay | Admitting: Cardiology

## 2018-04-17 ENCOUNTER — Telehealth (HOSPITAL_COMMUNITY): Payer: Self-pay | Admitting: Licensed Clinical Social Worker

## 2018-04-17 ENCOUNTER — Other Ambulatory Visit (HOSPITAL_COMMUNITY): Payer: Self-pay | Admitting: Unknown Physician Specialty

## 2018-04-17 ENCOUNTER — Telehealth (HOSPITAL_COMMUNITY): Payer: Self-pay | Admitting: Unknown Physician Specialty
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
## 2018-04-17 NOTE — Progress Notes (Signed)
ERROR
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
## 2018-04-17 NOTE — Telephone Encounter (Signed)
sidenafil PA started on [employer]

## 2018-04-21 ENCOUNTER — Other Ambulatory Visit (HOSPITAL_COMMUNITY): Payer: Self-pay | Admitting: Unknown Physician Specialty

## 2018-04-21 DIAGNOSIS — Z95811 Presence of heart assist device: Secondary | ICD-10-CM

## 2018-04-21 DIAGNOSIS — Z7901 Long term (current) use of anticoagulants: Secondary | ICD-10-CM

## 2018-04-22 ENCOUNTER — Encounter (HOSPITAL_COMMUNITY): Payer: Medicaid Other

## 2018-04-22 ENCOUNTER — Telehealth (HOSPITAL_COMMUNITY): Payer: Self-pay | Admitting: *Deleted

## 2018-04-22 NOTE — Telephone Encounter (Signed)
Attempted to call patient twice today to reschedule follow up appointment. Left voicemail requesting call back.   [name] RN VAD Coordinator  Office: [phone]  24/7 Pager: [phone]

## 2018-04-23 ENCOUNTER — Ambulatory Visit: Payer: Medicaid Other | Admitting: Physician Assistant

## 2018-04-24 ENCOUNTER — Telehealth (HOSPITAL_COMMUNITY): Payer: Self-pay | Admitting: *Deleted

## 2018-04-24 NOTE — Telephone Encounter (Signed)
Pt called VAD coordinator to re-schedule missed appt. Informed him multiple people have been trying to call and have left messages, he responded "my phone is messed up". He gave permission to call his caregiver for any issues.   Pt agreed to get INR at [facility] tomorrow.  1 mo VAD clinic appt re-scheduled to Tuesday, 04/29/17. Pt verbalized understanding of same.   Hessie Diener RN, VAD Coordinator 24/7 VAD Pager: [phone]

## 2018-04-25 ENCOUNTER — Ambulatory Visit (HOSPITAL_COMMUNITY): Payer: Self-pay | Admitting: Pharmacist

## 2018-04-25 ENCOUNTER — Other Ambulatory Visit (HOSPITAL_COMMUNITY)
Admission: RE | Admit: 2018-04-25 | Discharge: 2018-04-25 | Disposition: A | Discharge status: Home / Self Care | Payer: Medicaid Other | Source: Ambulatory Visit | Attending: Cardiology | Admitting: Cardiology

## 2018-04-25 DIAGNOSIS — Z7901 Long term (current) use of anticoagulants: Secondary | ICD-10-CM | POA: Diagnosis present

## 2018-04-25 DIAGNOSIS — Z95811 Presence of heart assist device: Secondary | ICD-10-CM | POA: Insufficient documentation

## 2018-04-25 LAB — PROTIME-INR
INR: 1.51
Prothrombin Time: 18 seconds — ABNORMAL HIGH (ref 11.4–15.2)

## 2018-04-25 MED ORDER — ENOXAPARIN SODIUM 30 MG/0.3ML ~~LOC~~ SOLN: 30.0000 mg | Freq: Two times a day (BID) | SUBCUTANEOUS | 0 refills | Status: DC

## 2018-04-25 NOTE — Telephone Encounter (Signed)
Sildenafil PA approved via  tracks Approval # [id number] Valid 04/17/2018-04/12/2019  Approval faxed to patients pharmacy @ [phone]

## 2018-04-28 ENCOUNTER — Other Ambulatory Visit (HOSPITAL_COMMUNITY): Payer: Self-pay | Admitting: Unknown Physician Specialty

## 2018-04-28 DIAGNOSIS — Z95811 Presence of heart assist device: Secondary | ICD-10-CM

## 2018-04-28 DIAGNOSIS — Z7901 Long term (current) use of anticoagulants: Secondary | ICD-10-CM

## 2018-04-29 ENCOUNTER — Encounter (HOSPITAL_COMMUNITY): Payer: Self-pay | Admitting: Unknown Physician Specialty

## 2018-04-29 ENCOUNTER — Encounter (HOSPITAL_COMMUNITY): Payer: Medicaid Other

## 2018-04-29 ENCOUNTER — Telehealth (HOSPITAL_COMMUNITY): Payer: Self-pay | Admitting: Licensed Clinical Social Worker

## 2018-04-29 NOTE — Telephone Encounter (Signed)
Patient was a no show in clinic today. CSW attempted to reach patient by phone with no answer. Message left for return call. Lasandra Beech, LCSW, CCSW-MCS [phone]

## 2018-04-30 ENCOUNTER — Telehealth (HOSPITAL_COMMUNITY): Payer: Self-pay | Admitting: *Deleted

## 2018-04-30 ENCOUNTER — Other Ambulatory Visit (HOSPITAL_COMMUNITY): Payer: Self-pay | Admitting: *Deleted

## 2018-04-30 NOTE — Telephone Encounter (Signed)
Called patient and caregiver and left messages re: re-scheduled VAD Clinic appt for 05/02/18 at 10:00. Asked both to call if any questions. Also offered transportation if caregiver is working and patient needs a ride. Reminded both that pt is on Lovenox injections for low INR and it is very important to get INR re-checked this week.   Hessie Diener RN, VAD Coordinator 24/7 VAD pager: [phone]

## 2018-05-02 ENCOUNTER — Encounter (HOSPITAL_COMMUNITY): Payer: Medicaid Other

## 2018-05-05 ENCOUNTER — Other Ambulatory Visit (HOSPITAL_COMMUNITY): Payer: Self-pay | Admitting: *Deleted

## 2018-05-05 DIAGNOSIS — Z95811 Presence of heart assist device: Secondary | ICD-10-CM

## 2018-05-05 DIAGNOSIS — Z7901 Long term (current) use of anticoagulants: Secondary | ICD-10-CM

## 2018-05-06 ENCOUNTER — Encounter (HOSPITAL_COMMUNITY): Payer: Medicaid Other

## 2018-05-06 ENCOUNTER — Telehealth (HOSPITAL_COMMUNITY): Payer: Self-pay | Admitting: *Deleted

## 2018-05-06 ENCOUNTER — Telehealth (HOSPITAL_COMMUNITY): Payer: Self-pay | Admitting: Licensed Clinical Social Worker

## 2018-05-06 NOTE — Telephone Encounter (Signed)
Ry called clinic and left message stating that he was unable to make his appointment today at 10:00 due to transportation issues. Left voicemail informing him that I rescheduled him for Tuesday 05/13/2018 at 0900. Stressed importance of him coming to clinic for assessment and lab work. Requested him to call clinic with any questions.   [name] RN VAD Coordinator  Office: [phone]  24/7 Pager: [phone]

## 2018-05-06 NOTE — Telephone Encounter (Signed)
CSW contacted patient due to report from VAD Coordinator of cancelled clinic visit today. Patient states that he doesn't have enough gas to get to his appointment. CSW offered a [employer] gift card to get gas. Patient states he doesn't have enough to even get to the clinic and "not ready yet either and would be late for 10am appointment". CSW encouraged patient to call sooner for assistance and stressed the importance of compliance with clinic appointments.CSW will have VAD Coordinator return call with a rescheduled appointment.  Patient verbalizes understanding. Lasandra Beech, LCSW, CCSW-MCS [phone]

## 2018-05-07 ENCOUNTER — Telehealth (HOSPITAL_COMMUNITY): Payer: Self-pay | Admitting: *Deleted

## 2018-05-07 NOTE — Telephone Encounter (Signed)
Spoke with patient's caregiver Claris Che regarding patient's rescheduled appointment January 28th at 0900. She verbalized understanding.   [name] RN VAD Coordinator  Office: [phone]  24/7 Pager: [phone]

## 2018-05-13 ENCOUNTER — Ambulatory Visit (HOSPITAL_COMMUNITY)
Admission: RE | Admit: 2018-05-13 | Discharge: 2018-05-13 | Disposition: A | Discharge status: Home / Self Care | Payer: Medicaid Other | Source: Ambulatory Visit | Attending: Internal Medicine | Admitting: Internal Medicine

## 2018-05-13 ENCOUNTER — Ambulatory Visit (HOSPITAL_COMMUNITY): Payer: Self-pay | Admitting: Pharmacist

## 2018-05-13 DIAGNOSIS — I447 Left bundle-branch block, unspecified: Secondary | ICD-10-CM | POA: Diagnosis not present

## 2018-05-13 DIAGNOSIS — Z7901 Long term (current) use of anticoagulants: Secondary | ICD-10-CM | POA: Diagnosis not present

## 2018-05-13 DIAGNOSIS — Z7982 Long term (current) use of aspirin: Secondary | ICD-10-CM | POA: Diagnosis not present

## 2018-05-13 DIAGNOSIS — F329 Major depressive disorder, single episode, unspecified: Secondary | ICD-10-CM | POA: Insufficient documentation

## 2018-05-13 DIAGNOSIS — I5022 Chronic systolic (congestive) heart failure: Secondary | ICD-10-CM | POA: Diagnosis present

## 2018-05-13 DIAGNOSIS — F17210 Nicotine dependence, cigarettes, uncomplicated: Secondary | ICD-10-CM | POA: Diagnosis not present

## 2018-05-13 DIAGNOSIS — I472 Ventricular tachycardia: Secondary | ICD-10-CM | POA: Diagnosis not present

## 2018-05-13 DIAGNOSIS — I340 Nonrheumatic mitral (valve) insufficiency: Secondary | ICD-10-CM | POA: Insufficient documentation

## 2018-05-13 DIAGNOSIS — Z95810 Presence of automatic (implantable) cardiac defibrillator: Secondary | ICD-10-CM | POA: Insufficient documentation

## 2018-05-13 DIAGNOSIS — Z9889 Other specified postprocedural states: Secondary | ICD-10-CM | POA: Insufficient documentation

## 2018-05-13 DIAGNOSIS — Z95811 Presence of heart assist device: Secondary | ICD-10-CM

## 2018-05-13 DIAGNOSIS — F1010 Alcohol abuse, uncomplicated: Secondary | ICD-10-CM | POA: Insufficient documentation

## 2018-05-13 DIAGNOSIS — Z79899 Other long term (current) drug therapy: Secondary | ICD-10-CM | POA: Diagnosis not present

## 2018-05-13 DIAGNOSIS — I428 Other cardiomyopathies: Secondary | ICD-10-CM

## 2018-05-13 DIAGNOSIS — M792 Neuralgia and neuritis, unspecified: Secondary | ICD-10-CM

## 2018-05-13 LAB — COMPREHENSIVE METABOLIC PANEL
ALT: 34 U/L (ref 0–44)
AST: 36 U/L (ref 15–41)
Albumin: 4.6 g/dL (ref 3.5–5.0)
Alkaline Phosphatase: 124 U/L (ref 38–126)
Anion gap: 10 (ref 5–15)
BUN: 17 mg/dL (ref 6–20)
CO2: 27 mmol/L (ref 22–32)
Calcium: 10.1 mg/dL (ref 8.9–10.3)
Chloride: 99 mmol/L (ref 98–111)
Creatinine, Ser: 1.11 mg/dL (ref 0.61–1.24)
GFR calc Af Amer: >60 mL/min (ref >60)
Glucose, Bld: 114 mg/dL — ABNORMAL HIGH (ref 70–99)
Potassium: 3.5 mmol/L (ref 3.5–5.1)
Sodium: 136 mmol/L (ref 135–145)
Total Bilirubin: 0.8 mg/dL (ref 0.3–1.2)
Total Protein: 8.6 g/dL — ABNORMAL HIGH (ref 6.5–8.1)

## 2018-05-13 LAB — PREALBUMIN: Prealbumin: 29.8 mg/dL (ref 18–38)

## 2018-05-13 LAB — CBC
HCT: 49.3 % (ref 39.0–52.0)
HEMOGLOBIN: 17.2 g/dL — AB (ref 13.0–17.0)
MCH: 31.5 pg (ref 26.0–34.0)
MCHC: 34.9 g/dL (ref 30.0–36.0)
MCV: 90.3 fL (ref 80.0–100.0)
Platelets: 175 10*3/uL (ref 150–400)
RBC: 5.46 MIL/uL (ref 4.22–5.81)
RDW: 13.4 % (ref 11.5–15.5)
WBC: 8.1 10*3/uL (ref 4.0–10.5)
nRBC: 0 % (ref 0.0–0.2)

## 2018-05-13 LAB — PROTIME-INR
INR: 1.17
Prothrombin Time: 14.8 seconds (ref 11.4–15.2)

## 2018-05-13 LAB — LACTATE DEHYDROGENASE: LDH: 223 U/L — ABNORMAL HIGH (ref 98–192)

## 2018-05-13 MED ORDER — LOSARTAN POTASSIUM 25 MG PO TABS: 50.0000 mg | ORAL_TABLET | Freq: Every day | ORAL | 11 refills | Status: DC

## 2018-05-13 MED ORDER — GABAPENTIN 400 MG PO CAPS: 400.0000 mg | ORAL_CAPSULE | Freq: Two times a day (BID) | ORAL | 5 refills | Status: DC

## 2018-05-13 MED ORDER — WARFARIN SODIUM 3 MG PO TABS: 6.0000 mg | ORAL_TABLET | Freq: Every day | ORAL | 11 refills | Status: DC

## 2018-05-13 MED ORDER — SERTRALINE HCL 25 MG PO TABS: 50.0000 mg | ORAL_TABLET | Freq: Every day | ORAL | 3 refills | Status: DC

## 2018-05-13 MED ORDER — SILDENAFIL CITRATE 20 MG PO TABS: 20.0000 mg | ORAL_TABLET | Freq: Three times a day (TID) | ORAL | 3 refills | Status: DC

## 2018-05-13 MED ORDER — SERTRALINE HCL 50 MG PO TABS: 50.0000 mg | ORAL_TABLET | Freq: Every day | ORAL | 6 refills | Status: DC

## 2018-05-13 MED ORDER — ENOXAPARIN SODIUM 30 MG/0.3ML ~~LOC~~ SOLN: 30.0000 mg | Freq: Two times a day (BID) | SUBCUTANEOUS | 0 refills | Status: DC

## 2018-05-13 MED FILL — LOSARTAN POTASSIUM 25 MG TA: 25 | 30 days supply | Qty: 60 | Fill #0

## 2018-05-13 MED FILL — GABAPENTIN 400 MG CAPSULE: 400 | 30 days supply | Qty: 60 | Fill #0

## 2018-05-13 MED FILL — ENOXAPARIN 30 MG/0.3 ML SYR: 30 | 15 days supply | Qty: 9 | Fill #0

## 2018-05-13 MED FILL — SILDENAFIL CITRATE 20 MG TA: 20 | 30 days supply | Qty: 90 | Fill #0

## 2018-05-13 MED FILL — SERTRALINE HCL 50 MG TABLET: 50 | 30 days supply | Qty: 30 | Fill #0

## 2018-05-13 MED FILL — WARFARIN SODIUM 3 MG TABLET: 3 | 30 days supply | Qty: 60 | Fill #0

## 2018-05-13 NOTE — Progress Notes (Signed)
CSW met with patient in the clinic as he reported concerns with finances. Patient states his [place] lost her job and they have been living off of his SSI income. Patient also reported feeling down as they are both "in the house all day with little to do and no money to do anything". Patient shared thoughts of looking for some part time work to supplement income and "give me something to do". Patient has multiple medications at the pharmacy and CSW will assist with co-pays today. CSW also provided patient with gas card to get home. Patient grateful for the assistance. CSW will continue to be available for support and assistance as needed. Raquel Sarna, [place], [place]

## 2018-05-13 NOTE — Patient Instructions (Signed)
Patient Instructions: 1. Start Lovenox shots every 12 hours.  2. Increase Zoloft to 50 mg daily. 3. Stop Torsemide. 4. Stop Potassium.  5. Take 2 tablets of Coumadin tonight, Wednesday and Thursday. Go to [facility] and have INR rechecked on Friday and we will call you with results and instructions for your Coumadin on Friday. 6. Increase Losartan to 50 mg (2 tablets) daily.  7. Return to clinic in 2 weeks.

## 2018-05-13 NOTE — Progress Notes (Addendum)
Patient presents for his 2 week follow up in VAD Clinic today. Reports no problems with VAD equipment or concerns with drive line.  Pt states that he is depressed and doesn't do much during the day. Pt states that he stays on the couch watching TV most of the day.   Vital Signs:  Doppler Pressure: 96 Automatc BP: 121/73 (93) HR:  91 SPO2: 99%  Weight:161.8 lb w/o eqt Last weight: 158.2 lbs Home weights: 149 - 151 lbs   VAD Indication: Destination Therapy    VAD interrogation & Equipment Management: Speed: 5700 Flow: 5.0 Power: 4.6 w    PI: 3.9  Alarms: none Events: 60-70  Fixed speed 5700 Low speed limit: 5400  Primary Controller:  Replace back up battery in 30 months. Back up controller:   Replace back up battery in 30 months.  Annual Equipment Maintenance on UBC/PM was performed on 01/2018.   I reviewed the LVAD parameters from today and compared the results to the patient's prior recorded data. LVAD interrogation was NEGATIVE for significant power changes, NEGATIVE for clinical alarms and STABLE for PI events/speed drops. No programming changes were made and pump is functioning within specified parameters. Pt is performing daily controller and system monitor self tests along with completing weekly and monthly maintenance for LVAD equipment.  LVAD equipment check completed and is in good working order. Back-up equipment present.  Exit Site Care: Drive line is being maintained every other day by Claris Che. Drive line exit site well healing and incorporated. The velour is fully implanted at exit site. Dressing dry and intact. No erythema, scant amount of serous drainage. Stabilization device present and accurately applied. Pt denies fever or chills. Drive line exit site cleaned with Chlora prep applicators x 2, allowed to dry, and Sorbaview dressing with bio patch re-applied. No redness, tenderness, drainage, foul odor or rash noted. Drive line anchor re-applied. Pt  denies fever or chills. Pt given 8 weekly kits for home. Advance changes to weekly.  Significant Events on VAD Support:    Device:Medtronic Therapies: on 200 bpm Monitor: on slow VT 176; AT 171 Last check: 01/14/18   BP & Labs:  MAP 96 - Doppler is reflecting MAP  Hgb 17.2 - No S/S of bleeding. Specifically denies melena/BRBPR or nosebleeds.  LDH stable at  with established baseline of 200-300. Denies tea-colored urine. No power elevations noted on interrogation.   3 mo. Intermacs follow up completed including:  Quality of Life, KCCQ-12, and Neurocognitive trail making. (1 min; 11 secs)  Pt completed 1400 feet during 6 minute walk.   Patient Instructions: 1. Start Lovenox shots every 12 hours.  2. Increase Zoloft to 50 mg daily. 3. Stop Torsemide. 4. Stop Potassium.  5. Take 2 tablets of Coumadin tonight, Wednesday and Thursday. Go to [facility] and have INR rechecked on Friday and we will call you with results and instructions for your Coumadin on Friday. 6. Increase Losartan to 50 mg (2 tablets) daily.  7. Return to clinic in 2 weeks.   Carlton Adam RN VAD Coordinator   Office: [phone] 24/7 Emergency VAD Pager: [phone]     PCP: Julio Alm  HF MD:  Dr Shirlee Latch  Cardiology: Dr Tomie China.   HPI: Carl Ferguson is a 53 y.o. male with a history of chronic systolic HF previously on home milrinone, NICM s/p Medtronic ICD, chronic LBBB, tobacco abuse, ETOH abuse, substance abuse, hx of device plaments with extractions due to infection, and mitral regurgitation. S/P HMIII  01/27/2018.   He has long complicated history of CHF.  Care was initially in [place] and at [facility].  Previously had EF down to 30% in 2011 at initial diagnosis.  In 4/13, he got a single chamber ICD.  In 9/13, he had implantation of a subcutaneous array for high DFTs while on amiodarone for VT.  Echo in 11/15 with EF 30%.  In 11/15, single chamber ICD, leads, and subcutaneous array all  removed due to infection.  Discharged with [employer]. In 1/16, he had subcutaneous ICD placed.  In 9/16, this was explanted due to erosion.  EF on echo in 9/16 was 45%.  Echo in 11/17 showed EF 50%.    Admitted to [facility] 2/28 - 06/17/17 with SOB, orthopnea, and PND. He was off medicines for about 6 weeks prior due to insurance issues. Repeat Echo (3/19) significant for fall in EF to 15-20% with severe LV dilation and severe diffuse hypokinesis, moderate-severe MR.   Followed up with Dr. Tomie China 06/24/17. Feeling better at this appointment. Referred for LHC and HF team. Dr Excell Seltzer performed [facility] March 2019. No significant CAD.  CPX 3/19 showed peak VO2 24 (68% predicted) with VE/VCO2 slope 37 with RER 0.91 (submaximal).  Probably mild to moderate functional impairment.   He was admitted for LVAD work up. Underwent HM3 implantation on 01/27/18. 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  He returns for followup of LVAD.  He has missed several appointments and has been having significant stress in his relationship with his girlfriend.  There is concern that he may have used drugs and also may have been drinking too much at times.  He is smoking cigarettes only rarely.  Symptomatically doing quite well still, no dyspnea with any activities.  MAP elevated at 96.  Feels depressed.  He is having about 60-70 PIs over the last day, has been taking torsemide about twice a week.  No fever/chills.   Denies LVAD alarms.  Denies driveline trauma, erythema or drainage.  Denies ICD shocks.   Reports taking Coumadin as prescribed and adherence to anticoagulation based dietary restrictions.  Denies bright red blood per rectum or melena, no dark urine or hematuria.    PMH: 1. Active smoker.  2. Chronic systolic CHF: Nonischemic cardiomyopathy, diagnosed in 2011. HIV negative.  History of drug/ETOH abuse, now stopped.  - Echo 2011: EF 30%.  - In 4/13, he got a single chamber ICD.  In 9/13, he had implantation of a  subcutaneous array for high DFTs while on amiodarone for VT.  - Echo 11/15: EF 30%.  - In 11/15, single chamber ICD, leads, and subcutaneous array all removed due to infection. - In 1/16, he had subcutaneous ICD placed.  In 9/16, this was explanted due to erosion. - Echo 9/16: EF 45% - Echo 11/17: EF 50% - Echo (3/19): EF 15-20% with severe LV dilation and severe diffuse hypokinesis, moderate-severe MR.  - LHC (3/19): No significant coronary disease.  - CPX (3/19): Peak VO2 24 (68% predicted) with VE/VCO2 slope 37 with RER 0.91 (submaximal).  Probably mild to moderate functional impairment.  - Echo (9/19):  EF is around 20% with severe dilation, severe central mitral regurgitation. Severe LAE. - RHC (9/19): mean RA 3, PA 41/20 mean 30, mean PCWP 16, CI 1.4, PVR 5.6 WU.  - TEE (9/19): Severe LV dilation > 7 cm, EF 15-20%, dyssynchrony noted, mildly decreased RV systolic function, severe functional MR.  - Medtronic ICD with His bundle pacing 9/19, unable to  place LV lead.  - HM3 LVAD 10/19 3. LBBB 4. Mitral regurgitation: Severe on 9/19 echo. Suspect primarily functional.  5. Depression   Current Outpatient Medications  Medication Sig Dispense Refill  . aspirin EC 81 MG tablet Take 1 tablet (81 mg total) by mouth daily. 30 tablet 2  . gabapentin (NEURONTIN) 400 MG capsule Take 1 capsule (400 mg total) by mouth 2 (two) times daily. 60 capsule 5  . losartan (COZAAR) 25 MG tablet Take 2 tablets (50 mg total) by mouth daily. 30 tablet 11  . omeprazole (PRILOSEC) 20 MG capsule Take 1 capsule (20 mg total) by mouth daily. 30 capsule 0  . sildenafil (REVATIO) 20 MG tablet Take 1 tablet (20 mg total) by mouth 3 (three) times daily. 90 tablet 3  . spironolactone (ALDACTONE) 25 MG tablet Take 1 tablet (25 mg total) by mouth daily. 30 tablet 2  . warfarin (COUMADIN) 3 MG tablet Take 2 tablets (6 mg total) by mouth daily. 60 tablet 11  . acetaminophen (TYLENOL) 500 MG tablet Take 500 mg by mouth every  6 (six) hours as needed for mild pain or fever.     Marland Kitchen albuterol (PROVENTIL HFA;VENTOLIN HFA) 108 (90 Base) MCG/ACT inhaler Inhale 2 puffs into the lungs every 6 (six) hours as needed for wheezing or shortness of breath.    . enoxaparin (LOVENOX) 30 MG/0.3ML injection Inject 0.3 mLs (30 mg total) into the skin every 12 (twelve) hours. 30 Syringe 0  . sertraline (ZOLOFT) 50 MG tablet Take 1 tablet (50 mg total) by mouth daily. 30 tablet 6   No current facility-administered medications for this encounter.     Norflex [orphenadrine]  REVIEW OF SYSTEMS: All systems negative except as listed in HPI, PMH and Problem list.   LVAD INTERROGATION:  See nurse's note above for details.   I reviewed the LVAD parameters from today, and compared the results to the patient's prior recorded data.  No programming changes were made.  The LVAD is functioning within specified parameters.  The patient performs LVAD self-test daily.  LVAD interrogation was negative for any significant power changes, alarms or PI events/speed drops.  LVAD equipment check completed and is in good working order.  Back-up equipment present.   LVAD education done on emergency procedures and precautions and reviewed exit site care.   MAP 96 There were no vitals filed for this visit. Wt Readings from Last 3 Encounters:  04/14/18 73.9 kg (163 lb)  03/20/18 71.8 kg (158 lb 3.2 oz)  03/06/18 71.5 kg (157 lb 9.6 oz)   Physical Exam: General: Well appearing this am. NAD.  HEENT: Normal. Neck: Supple, JVP 7-8 cm. Carotids OK.  Cardiac:  Mechanical heart sounds with LVAD hum present.  Lungs:  CTAB, normal effort.  Abdomen:  NT, ND, no HSM. No bruits or masses. +BS  LVAD exit site: Well-healed and incorporated. Dressing dry and intact. No erythema or drainage. Stabilization device present and accurately applied. Driveline dressing changed daily per sterile technique. Extremities:  Warm and dry. No cyanosis, clubbing, rash, or edema.    Neuro:  Alert & oriented x 3. Cranial nerves grossly intact. Moves all 4 extremities w/o difficulty. Affect pleasant    ASSESSMENT AND PLAN:   1. Chronic Systolic CHF: S/p HM3 01/27/18. Nonischemic cardiomyopathy, diagnosed in 2011, initially followed at [place]. LHC (3/19) with no significant CAD. CPX (3/19) submaximal, but suggestive of mild to moderate HF limitation. HIV negative 3/19, history of drug/ETOH abuse in the past, no  drugs now and hadcut back ETOH considerably. Echo 9/19 showedEF 20% with severe LV dilation, severe MR. NYHA class IV symptoms. He has a wide LBBB. RHC was done in 9/19 showing low output, CI 1.4. He was admitted to start milrinone, unable to wean off milrinone and remained on 0.25 mcg/kg/min at home. Medtronic ICD was placed, he has a His bundle lead but EP was unable to get an LV lead in place =>His bundle pacing does not narrow his QRS and did not help us wean him off milrinone. HM3 placed 01/27/18. Speed increased to 5700 02/05/18 with echo + RHC LVAD optimization. LVAD interrogation stable except for more PI events.  Reports not drinking much fluid recently and still taking torsemide at least twice a week. NYHA class I-II symptoms. He is not volume overloaded on exam. MAP mildly elevated.  - Stop torsemide and encourage hydration. Stop KCl.  - Increase losartan to 50 mg daily.  - Continue spiro 12.5 mg daily.  -Continue warfarin with INR goal 2-2.5 and ASA 81.  INR today is 1.17, he says he is taking warfarin but has missed several INR visits.  We had a discussion today about the need for compliance with INR checks and office followup, including consequences of noncompliance such as pump thrombosis.  He will start Lovenox shots until INR > 1.8. Warfarin adjusted.   - Continue Sildenafil 20 mg TID, added for evidence of RV failure with PAPI 1.85 on post-op RHC. 2. ETOH Abuse:Drinking a few beers a day.  I encouraged him to stop drinking alcohol.  3.  Tobacco abuse: Using nicotine patch. Needs to eventually stop.  4. Mitral regurgitation: Functional.  Decreased MR s/p LVAD placement.  5. VT:  No further VT.  Now off amiodarone.   6. Depression: Increase sertraline to 50 mg daily.   Followup in LVAD clinic in 2 wks.  Long discussion about importance of INR compliance.   Marca AnconaDalton Salvatore Shear 05/14/2018

## 2018-05-15 ENCOUNTER — Other Ambulatory Visit (HOSPITAL_COMMUNITY): Payer: Self-pay | Admitting: *Deleted

## 2018-05-15 LAB — DRUG SCREEN 10 W/CONF, SERUM
Amphetamines, IA: NEGATIVE ng/mL
Barbiturates, IA: NEGATIVE ug/mL
Benzodiazepines, IA: NEGATIVE ng/mL
Cocaine & Metabolite, IA: NEGATIVE ng/mL
METHADONE, IA: NEGATIVE ng/mL
OXYCODONES, IA: NEGATIVE ng/mL
Opiates, IA: NEGATIVE ng/mL
Phencyclidine, IA: NEGATIVE ng/mL
Propoxyphene, IA: NEGATIVE ng/mL
THC(Marijuana) Metabolite, IA: NEGATIVE ng/mL

## 2018-05-21 ENCOUNTER — Telehealth (HOSPITAL_COMMUNITY): Payer: Self-pay | Admitting: Licensed Clinical Social Worker

## 2018-05-21 NOTE — Telephone Encounter (Signed)
CSW attempted to contact patient due to multiple missed appointments and concern regarding follow up. Message left for return call. Lasandra Beech, LCSW, CCSW-MCS [phone]

## 2018-05-26 ENCOUNTER — Other Ambulatory Visit (HOSPITAL_COMMUNITY): Payer: Self-pay | Admitting: *Deleted

## 2018-05-26 DIAGNOSIS — Z7901 Long term (current) use of anticoagulants: Secondary | ICD-10-CM

## 2018-05-26 DIAGNOSIS — F1910 Other psychoactive substance abuse, uncomplicated: Secondary | ICD-10-CM

## 2018-05-26 DIAGNOSIS — Z95811 Presence of heart assist device: Secondary | ICD-10-CM

## 2018-05-27 ENCOUNTER — Encounter (HOSPITAL_COMMUNITY): Payer: Medicaid Other

## 2018-05-27 ENCOUNTER — Telehealth (HOSPITAL_COMMUNITY): Payer: Self-pay | Admitting: *Deleted

## 2018-05-27 NOTE — Telephone Encounter (Signed)
Patient did not show up for his two week follow up appointment. Left voicemail informing of missed appointment and requesting a call back to reschedule.   Also called patient's caregiver Claris Che and left same voicemail message for her.  [name] RN VAD Coordinator  Office: [phone]  24/7 Pager: [phone]

## 2018-06-03 ENCOUNTER — Telehealth (HOSPITAL_COMMUNITY): Payer: Self-pay | Admitting: Licensed Clinical Social Worker

## 2018-06-03 ENCOUNTER — Encounter (HOSPITAL_COMMUNITY): Payer: Medicaid Other

## 2018-06-03 NOTE — Telephone Encounter (Signed)
CSW attempted to reach patient with no answer. Message left for return call. Patient has missed multiple appointments. Lasandra Beech, LCSW, CCSW-MCS [phone]

## 2018-06-05 ENCOUNTER — Ambulatory Visit (HOSPITAL_COMMUNITY): Payer: Self-pay | Admitting: Unknown Physician Specialty

## 2018-06-05 ENCOUNTER — Other Ambulatory Visit (HOSPITAL_COMMUNITY)
Admission: RE | Admit: 2018-06-05 | Discharge: 2018-06-05 | Disposition: A | Discharge status: Home / Self Care | Payer: Medicaid Other | Source: Ambulatory Visit | Attending: Cardiology | Admitting: Cardiology

## 2018-06-05 ENCOUNTER — Telehealth (HOSPITAL_COMMUNITY): Payer: Self-pay | Admitting: *Deleted

## 2018-06-05 DIAGNOSIS — Z95811 Presence of heart assist device: Secondary | ICD-10-CM | POA: Diagnosis not present

## 2018-06-05 DIAGNOSIS — Z7901 Long term (current) use of anticoagulants: Secondary | ICD-10-CM | POA: Diagnosis present

## 2018-06-05 LAB — BASIC METABOLIC PANEL
Anion gap: 8 (ref 5–15)
BUN: 7 mg/dL (ref 6–20)
CO2: 22 mmol/L (ref 22–32)
Calcium: 9.2 mg/dL (ref 8.9–10.3)
Chloride: 108 mmol/L (ref 98–111)
Creatinine, Ser: 0.85 mg/dL (ref 0.61–1.24)
GFR calc Af Amer: >60 mL/min (ref >60)
GFR calc non Af Amer: >60 mL/min (ref >60)
Glucose, Bld: 80 mg/dL (ref 70–99)
Potassium: 3.9 mmol/L (ref 3.5–5.1)
Sodium: 138 mmol/L (ref 135–145)

## 2018-06-05 LAB — CBC
HCT: 45.6 % (ref 39.0–52.0)
Hemoglobin: 15.8 g/dL (ref 13.0–17.0)
MCH: 32.1 pg (ref 26.0–34.0)
MCHC: 34.6 g/dL (ref 30.0–36.0)
MCV: 92.7 fL (ref 80.0–100.0)
Platelets: 166 10*3/uL (ref 150–400)
RBC: 4.92 MIL/uL (ref 4.22–5.81)
RDW: 14.6 % (ref 11.5–15.5)
WBC: 7 10*3/uL (ref 4.0–10.5)
nRBC: 0 % (ref 0.0–0.2)

## 2018-06-05 LAB — LACTATE DEHYDROGENASE: LDH: 194 U/L — ABNORMAL HIGH (ref 98–192)

## 2018-06-05 LAB — PROTIME-INR
INR: 2.01
Prothrombin Time: 22.5 seconds — ABNORMAL HIGH (ref 11.4–15.2)

## 2018-06-05 NOTE — Telephone Encounter (Signed)
Patient called VAD pager to ask if he could get INR checked at [facility] today. Reports he will be in that area and was unable to make it to clinic this week because his care "got re-possessed". He says he has his car back, but cannot make it to clinic due to "not enough gas money". Will get INR checked today and call us when he can manage to get back here for clinic visit. He says he has adequate dressing supplies.   Per his request, I changed his contact # to Claris Che (friend) cell #.  Hessie Diener RN, VAD Coordinator [phone]

## 2018-06-12 ENCOUNTER — Encounter (HOSPITAL_COMMUNITY): Payer: Medicaid Other

## 2018-06-17 ENCOUNTER — Other Ambulatory Visit (HOSPITAL_COMMUNITY): Payer: Self-pay | Admitting: *Deleted

## 2018-06-17 DIAGNOSIS — Z7901 Long term (current) use of anticoagulants: Secondary | ICD-10-CM

## 2018-06-17 DIAGNOSIS — Z95811 Presence of heart assist device: Secondary | ICD-10-CM

## 2018-06-17 DIAGNOSIS — F1910 Other psychoactive substance abuse, uncomplicated: Secondary | ICD-10-CM

## 2018-06-18 ENCOUNTER — Encounter (HOSPITAL_COMMUNITY): Payer: Medicaid Other

## 2018-06-18 LAB — DRUG SCREEN 10 W/CONF, SERUM
AMPHETAMINES, IA: NEGATIVE ng/mL
Barbiturates, IA: POSITIVE ug/mL
Benzodiazepines, IA: NEGATIVE ng/mL
Cocaine & Metabolite, IA: NEGATIVE ng/mL
Methadone, IA: NEGATIVE ng/mL
OXYCODONES, IA: NEGATIVE ng/mL
Opiates, IA: NEGATIVE ng/mL
PROPOXYPHENE, IA: NEGATIVE ng/mL
Phencyclidine, IA: NEGATIVE ng/mL
THC(Marijuana) Metabolite, IA: NEGATIVE ng/mL

## 2018-06-18 LAB — BARBITURATES,MS,WB/SP RFX
Amobarbital: NEGATIVE ug/mL
Barbiturates Confirmation: POSITIVE
Butabarbital: NEGATIVE ug/mL
Butalbital: 5.5 ug/mL
Pentobarbital: NEGATIVE ug/mL
Phenobarbital: NEGATIVE ug/mL
Secobarbital: NEGATIVE ug/mL

## 2018-06-23 ENCOUNTER — Encounter (HOSPITAL_COMMUNITY): Payer: Self-pay | Admitting: Unknown Physician Specialty

## 2018-06-23 ENCOUNTER — Inpatient Hospital Stay (HOSPITAL_COMMUNITY): Admission: RE | Admit: 2018-06-23 | Payer: Medicaid Other | Source: Ambulatory Visit

## 2018-07-02 ENCOUNTER — Telehealth (HOSPITAL_COMMUNITY): Payer: Self-pay | Admitting: *Deleted

## 2018-07-02 ENCOUNTER — Telehealth (HOSPITAL_COMMUNITY): Payer: Self-pay | Admitting: Licensed Clinical Social Worker

## 2018-07-02 NOTE — Telephone Encounter (Signed)
Message from HF team from [facility] asking for updated medication list and if patient should "still be on Lovenox shots".  Called Sharyl Nimrod and left message with current medication list. Pt's last INR on 05/05/18 was within goal and patient should have stopped Lovenox injections at that time. Pt was supposed to have repeat INR on 06/12/18; pt missed appt and has not had labs checked since 05/05/18.  Asked her to call VAD office at [phone] if any further questions.  Hessie Diener RN, VAD Coordinator [phone]

## 2018-07-02 NOTE — Telephone Encounter (Signed)
Message left for return call to review needs for CoVid 19 public health concerns and emergency VAD pager number. Jackie Saadia Dewitt, LCSW, CCSW-MCS [phone] 

## 2018-07-04 ENCOUNTER — Other Ambulatory Visit (HOSPITAL_COMMUNITY): Payer: Self-pay | Admitting: *Deleted

## 2018-07-04 ENCOUNTER — Other Ambulatory Visit (HOSPITAL_COMMUNITY)
Admission: RE | Admit: 2018-07-04 | Discharge: 2018-07-04 | Disposition: A | Discharge status: Home / Self Care | Payer: Medicaid Other | Source: Ambulatory Visit | Attending: Cardiology | Admitting: Cardiology

## 2018-07-04 ENCOUNTER — Other Ambulatory Visit: Payer: Self-pay

## 2018-07-04 ENCOUNTER — Telehealth (HOSPITAL_COMMUNITY): Payer: Self-pay | Admitting: *Deleted

## 2018-07-04 ENCOUNTER — Ambulatory Visit (HOSPITAL_COMMUNITY): Payer: Self-pay | Admitting: Pharmacist

## 2018-07-04 ENCOUNTER — Other Ambulatory Visit: Payer: Self-pay | Admitting: *Deleted

## 2018-07-04 DIAGNOSIS — T827XXA Infection and inflammatory reaction due to other cardiac and vascular devices, implants and grafts, initial encounter: Secondary | ICD-10-CM

## 2018-07-04 DIAGNOSIS — Z95811 Presence of heart assist device: Secondary | ICD-10-CM | POA: Diagnosis present

## 2018-07-04 DIAGNOSIS — Z7901 Long term (current) use of anticoagulants: Secondary | ICD-10-CM

## 2018-07-04 LAB — PROTIME-INR
INR: 1.1 (ref 0.8–1.2)
Prothrombin Time: 14.5 seconds (ref 11.4–15.2)

## 2018-07-04 LAB — BASIC METABOLIC PANEL
Anion gap: 9 (ref 5–15)
BUN: 8 mg/dL (ref 6–20)
CO2: 24 mmol/L (ref 22–32)
Calcium: 9.4 mg/dL (ref 8.9–10.3)
Chloride: 107 mmol/L (ref 98–111)
Creatinine, Ser: 0.82 mg/dL (ref 0.61–1.24)
GFR calc Af Amer: >60 mL/min (ref >60)
GFR calc non Af Amer: >60 mL/min (ref >60)
Glucose, Bld: 108 mg/dL — ABNORMAL HIGH (ref 70–99)
Potassium: 3.7 mmol/L (ref 3.5–5.1)
Sodium: 140 mmol/L (ref 135–145)

## 2018-07-04 LAB — CBC
HCT: 44.2 % (ref 39.0–52.0)
Hemoglobin: 15.4 g/dL (ref 13.0–17.0)
MCH: 32.4 pg (ref 26.0–34.0)
MCHC: 34.8 g/dL (ref 30.0–36.0)
MCV: 92.9 fL (ref 80.0–100.0)
Platelets: 133 10*3/uL — ABNORMAL LOW (ref 150–400)
RBC: 4.76 MIL/uL (ref 4.22–5.81)
RDW: 14.8 % (ref 11.5–15.5)
WBC: 6.2 10*3/uL (ref 4.0–10.5)
nRBC: 0 % (ref 0.0–0.2)

## 2018-07-04 LAB — LACTATE DEHYDROGENASE: LDH: 175 U/L (ref 98–192)

## 2018-07-04 MED ORDER — DOXYCYCLINE HYCLATE 100 MG PO CAPS: 100.0000 mg | ORAL_CAPSULE | Freq: Two times a day (BID) | ORAL | 0 refills | Status: AC

## 2018-07-04 MED ORDER — WARFARIN SODIUM 3 MG PO TABS: 6.0000 mg | ORAL_TABLET | Freq: Every day | ORAL | 11 refills | Status: DC

## 2018-07-04 MED ORDER — ENOXAPARIN SODIUM 30 MG/0.3ML ~~LOC~~ SOLN: 30.0000 mg | Freq: Two times a day (BID) | SUBCUTANEOUS | 1 refills | Status: DC

## 2018-07-04 NOTE — Telephone Encounter (Signed)
Patient called reporting increasing brown drainage from drive line site. Denies fever and chills. Per Dr. Shirlee Latch Doxycycline 100mg  BID x 10 days. Instructed to change dressing daily. He has "a few" daily kits at home, and just picked up weekly kits this morning. VAD coordinator unaware of drainage until patient called once he was at home. Reminded him about appointment on 07/15/18. Patient verbalized understanding of all instructions and said he would set up transportation for his clinic visit.   [name] RN VAD Coordinator  Office: [phone]  24/7 Pager: [phone]

## 2018-07-07 LAB — MISC LABCORP TEST (SEND OUT): Labcorp test code: 700841

## 2018-07-10 ENCOUNTER — Ambulatory Visit (HOSPITAL_COMMUNITY): Payer: Self-pay | Admitting: Pharmacist

## 2018-07-10 DIAGNOSIS — Z95811 Presence of heart assist device: Secondary | ICD-10-CM

## 2018-07-10 LAB — PROTIME-INR: INR: 1.6 — AB (ref <=1.1)

## 2018-07-11 ENCOUNTER — Encounter (HOSPITAL_COMMUNITY)
Admission: RE | Admit: 2018-07-11 | Discharge: 2018-07-11 | Disposition: A | Discharge status: Home / Self Care | Payer: Medicaid Other | Source: Ambulatory Visit | Attending: Cardiology | Admitting: Cardiology

## 2018-07-11 ENCOUNTER — Other Ambulatory Visit: Payer: Self-pay

## 2018-07-11 DIAGNOSIS — Z95811 Presence of heart assist device: Secondary | ICD-10-CM | POA: Insufficient documentation

## 2018-07-11 LAB — PROTIME-INR
INR: 1.6 — ABNORMAL HIGH (ref 0.8–1.2)
Prothrombin Time: 18.8 seconds — ABNORMAL HIGH (ref 11.4–15.2)

## 2018-07-14 ENCOUNTER — Other Ambulatory Visit: Payer: Self-pay

## 2018-07-14 ENCOUNTER — Ambulatory Visit (INDEPENDENT_AMBULATORY_CARE_PROVIDER_SITE_OTHER): Payer: Medicaid Other | Admitting: *Deleted

## 2018-07-14 DIAGNOSIS — I5022 Chronic systolic (congestive) heart failure: Secondary | ICD-10-CM

## 2018-07-14 DIAGNOSIS — I428 Other cardiomyopathies: Secondary | ICD-10-CM

## 2018-07-14 LAB — CUP PACEART REMOTE DEVICE CHECK
Battery Remaining Longevity: 82 mo
Battery Voltage: 2.98 V
Brady Statistic AP VP Percent: 0.54 %
Brady Statistic AS VP Percent: 88.39 %
Brady Statistic RA Percent Paced: 0.52 %
Brady Statistic RV Percent Paced: 86.14 %
HighPow Impedance: 62 Ohm
Implantable Lead Implant Date: 20190919
Implantable Lead Implant Date: 20190919
Implantable Lead Implant Date: 20190919
Implantable Lead Location: 753858
Implantable Lead Location: 753859
Implantable Lead Location: 753860
Implantable Lead Model: 3830
Implantable Lead Model: 5076
Implantable Lead Model: 6935
Implantable Pulse Generator Implant Date: 20190919
Lead Channel Impedance Value: 228 Ohm
Lead Channel Impedance Value: 247 Ohm
Lead Channel Impedance Value: 285 Ohm
Lead Channel Impedance Value: 361 Ohm
Lead Channel Impedance Value: 399 Ohm
Lead Channel Impedance Value: 532 Ohm
Lead Channel Pacing Threshold Amplitude: 0.5 V
Lead Channel Pacing Threshold Amplitude: 1.25 V
Lead Channel Pacing Threshold Amplitude: 2.125 V
Lead Channel Pacing Threshold Pulse Width: 0.4 ms
Lead Channel Pacing Threshold Pulse Width: 0.4 ms
Lead Channel Pacing Threshold Pulse Width: 0.4 ms
Lead Channel Sensing Intrinsic Amplitude: 2.375 mV
Lead Channel Sensing Intrinsic Amplitude: 2.375 mV
Lead Channel Sensing Intrinsic Amplitude: 3.625 mV
Lead Channel Sensing Intrinsic Amplitude: 3.625 mV
Lead Channel Setting Pacing Amplitude: 1.5 V
Lead Channel Setting Pacing Amplitude: 2 V
Lead Channel Setting Pacing Amplitude: 2 V
Lead Channel Setting Pacing Pulse Width: 0.4 ms
Lead Channel Setting Pacing Pulse Width: 0.4 ms
Lead Channel Setting Sensing Sensitivity: 0.3 mV
MDC IDC SESS DTM: 20200330103528
MDC IDC STAT BRADY AP VS PERCENT: 0 %
MDC IDC STAT BRADY AS VS PERCENT: 11.07 %

## 2018-07-15 ENCOUNTER — Inpatient Hospital Stay (HOSPITAL_COMMUNITY): Admission: RE | Admit: 2018-07-15 | Payer: Medicaid Other | Source: Ambulatory Visit

## 2018-07-16 ENCOUNTER — Telehealth (HOSPITAL_COMMUNITY): Payer: Self-pay | Admitting: Licensed Clinical Social Worker

## 2018-07-16 NOTE — Telephone Encounter (Signed)
Message left for return call to review needs for Covid 19 public health concerns and emergency VAD pager number. Jackie Nishi Neiswonger, LCSW, CCSW-MCS [phone]  

## 2018-07-17 ENCOUNTER — Telehealth (HOSPITAL_COMMUNITY): Payer: Self-pay | Admitting: *Deleted

## 2018-07-17 NOTE — Telephone Encounter (Signed)
Attempted to call patient [phone] twice regarding missed appointment and need for INR check. Line does not ring but a message "the person you are calling is unavailable now" plays without an option to leave a voicemail.   [name] RN VAD Coordinator  Office: [phone]  24/7 Pager: [phone]

## 2018-07-21 NOTE — Progress Notes (Signed)
Remote ICD transmission.   

## 2018-07-22 ENCOUNTER — Telehealth: Payer: Self-pay | Admitting: *Deleted

## 2018-07-22 NOTE — Telephone Encounter (Signed)
Patient called VAD pager to notify us that he has relocated to his mother's home in [place]. Reports restraining order was filed against him by his caregiver Claris Che. He does not know how long he plans on staying in [place] but is requesting to have care established in [place] "in case I am here for a while." Patient states he has "all my meds with me" and [facility] [place] for refills when needed. Dr. Shirlee Latch and Annice Pih CSW made aware of patient's relocation. Will send standing INR order to [facility] location below.   Mother's address:  [address] [place], [place] [address]   Carl Ferguson's new cell #: [phone] Mother's home #: [id number]   Closest [facility]:  [address] [place], [place] [address] Phone: [phone]  Fax:  [phone] Hours 6am-2pm   [name] RN VAD Coordinator  Office: [phone]  24/7 Pager: [phone]

## 2018-07-24 ENCOUNTER — Telehealth (HOSPITAL_COMMUNITY): Payer: Self-pay | Admitting: *Deleted

## 2018-07-24 NOTE — Telephone Encounter (Signed)
Pt called VAD pager to report he was at [facility] and they had not received orders.   Verbal order given for standing INRs given to [facility] nurse; original orders re-faxed.   Hessie Diener RN, VAD Coordinator [phone]

## 2018-07-25 ENCOUNTER — Ambulatory Visit (HOSPITAL_COMMUNITY): Payer: Self-pay | Admitting: Pharmacist

## 2018-07-25 DIAGNOSIS — Z95811 Presence of heart assist device: Secondary | ICD-10-CM

## 2018-07-25 LAB — PROTIME-INR: INR: 1.9 — AB (ref <=1.1)

## 2018-07-30 ENCOUNTER — Encounter (HOSPITAL_COMMUNITY): Payer: Self-pay | Admitting: *Deleted

## 2018-07-30 NOTE — Patient Instructions (Signed)
Patient asked that we locate local VAD center since he has re-located to [place]. Contacted [facility] (located 5 mi from his current residence) and spoke with Theodoro Grist (VAD Coordinator). Junius Argyle, VAD Coordinator faxing a packet to their center for review along with Dr. Alford Highland direct contact info for any questions. [facility] Ph: [phone] Fax: [phone]  Hessie Diener RN, VAD Coordinator [phone]

## 2018-08-07 ENCOUNTER — Other Ambulatory Visit (HOSPITAL_COMMUNITY): Payer: Self-pay | Admitting: Cardiology

## 2018-08-07 ENCOUNTER — Other Ambulatory Visit: Payer: Self-pay | Admitting: Cardiology

## 2018-08-07 DIAGNOSIS — I5022 Chronic systolic (congestive) heart failure: Secondary | ICD-10-CM

## 2018-08-07 DIAGNOSIS — I428 Other cardiomyopathies: Secondary | ICD-10-CM

## 2018-08-07 DIAGNOSIS — Z95811 Presence of heart assist device: Secondary | ICD-10-CM

## 2018-08-07 MED FILL — WARFARIN SODIUM 3 MG TABLET: 3 | 30 days supply | Qty: 30 | Fill #0

## 2018-08-07 MED FILL — GABAPENTIN 400 MG CAPSULE: 400 | 30 days supply | Qty: 60 | Fill #0

## 2018-08-07 MED FILL — SERTRALINE HCL 25 MG TABLET: 25 | 30 days supply | Qty: 30 | Fill #0

## 2018-08-07 MED FILL — SPIRONOLACTONE 25 MG TABS: 25 | 30 days supply | Qty: 30 | Fill #0

## 2018-08-07 MED FILL — LOSARTAN POTASSIUM 25 MG TA: 25 | 30 days supply | Qty: 30 | Fill #0

## 2018-08-07 MED FILL — SILDENAFIL CITRATE 20 MG TA: 20 | 30 days supply | Qty: 90 | Fill #0

## 2018-08-08 ENCOUNTER — Ambulatory Visit (HOSPITAL_COMMUNITY): Payer: Self-pay | Admitting: Pharmacist

## 2018-08-08 ENCOUNTER — Telehealth (HOSPITAL_COMMUNITY): Payer: Self-pay | Admitting: *Deleted

## 2018-08-08 DIAGNOSIS — Z95811 Presence of heart assist device: Secondary | ICD-10-CM

## 2018-08-08 LAB — PROTIME-INR
INR: 3.7 — AB (ref <=1.1)
INR: 3.7 — ABNORMAL HIGH (ref 0.8–1.2)
Prothrombin Time: 33.9 s — ABNORMAL HIGH (ref 9.1–12.0)

## 2018-08-08 NOTE — Telephone Encounter (Signed)
Called [facility] and spoke with Raynelle Fanning re: patient referral for VAD care. She is in a meeting and will return my call.  Hessie Diener RN, VAD Coordinator [phone]

## 2018-08-12 ENCOUNTER — Telehealth (HOSPITAL_COMMUNITY): Payer: Self-pay | Admitting: *Deleted

## 2018-08-12 ENCOUNTER — Encounter (HOSPITAL_COMMUNITY): Payer: Self-pay | Admitting: *Deleted

## 2018-08-12 NOTE — Telephone Encounter (Signed)
Pt was refused for VAD Care at [facility]. Was advised to reach out to [facility] in [place].  Called VAD Clinic and left message with Asher Muir asking for return call re: VAD patient transfer to their facility for ongoing VAD management.  Hessie Diener RN, VAD Coordinator [phone]

## 2018-08-12 NOTE — Progress Notes (Unsigned)
Referral packet faxed to [facility].   Phone #: [phone] Fax #: [phone]   [name] RN VAD Coordinator  Office: [phone]  24/7 Pager: [phone]

## 2018-08-20 ENCOUNTER — Telehealth (HOSPITAL_COMMUNITY): Payer: Self-pay | Admitting: *Deleted

## 2018-08-20 ENCOUNTER — Other Ambulatory Visit (HOSPITAL_COMMUNITY): Payer: Self-pay | Admitting: Pharmacist

## 2018-08-20 DIAGNOSIS — Z95811 Presence of heart assist device: Secondary | ICD-10-CM

## 2018-08-20 DIAGNOSIS — I428 Other cardiomyopathies: Secondary | ICD-10-CM

## 2018-08-20 DIAGNOSIS — M792 Neuralgia and neuritis, unspecified: Secondary | ICD-10-CM

## 2018-08-20 DIAGNOSIS — I5022 Chronic systolic (congestive) heart failure: Secondary | ICD-10-CM

## 2018-08-20 DIAGNOSIS — Z7901 Long term (current) use of anticoagulants: Secondary | ICD-10-CM

## 2018-08-20 MED ORDER — LOSARTAN POTASSIUM 25 MG PO TABS: 50.0000 mg | ORAL_TABLET | Freq: Every day | ORAL | 11 refills | Status: DC

## 2018-08-20 MED ORDER — WARFARIN SODIUM 3 MG PO TABS: 6.0000 mg | ORAL_TABLET | Freq: Every day | ORAL | 3 refills | Status: DC

## 2018-08-20 NOTE — Telephone Encounter (Signed)
Updated patient regarding [facility] accepting patient. They will be reaching out to him to set up initial appointment. Patient verbalized understanding.   Per [name] he received shipment from [employer] [facility] of his medications, but received half the amount of Losartan and Coumadin he needed for a month. Spoke with Misty Stanley PharmD who will take care of ordering refills.   [name] RN VAD Coordinator  Office: [phone]  24/7 Pager: [phone]

## 2018-08-21 ENCOUNTER — Other Ambulatory Visit: Payer: Self-pay | Admitting: Cardiology

## 2018-08-22 LAB — PROTIME-INR
INR: 2.9 — ABNORMAL HIGH (ref 0.8–1.2)
Prothrombin Time: 27.7 s — ABNORMAL HIGH (ref 9.1–12.0)

## 2018-08-29 ENCOUNTER — Ambulatory Visit (HOSPITAL_COMMUNITY): Payer: Self-pay | Admitting: Pharmacist

## 2018-08-29 ENCOUNTER — Telehealth (HOSPITAL_COMMUNITY): Payer: Self-pay | Admitting: Licensed Clinical Social Worker

## 2018-08-29 DIAGNOSIS — Z95811 Presence of heart assist device: Secondary | ICD-10-CM

## 2018-08-29 LAB — POCT INR: INR: 2.9 (ref 2.0–3.0)

## 2018-08-29 MED FILL — LOSARTAN POTASSIUM 25 MG TA: 25 | 30 days supply | Qty: 60 | Fill #0

## 2018-08-29 MED FILL — WARFARIN SODIUM 3 MG TABLET: 3 | 30 days supply | Qty: 60 | Fill #0

## 2018-08-29 NOTE — Telephone Encounter (Signed)
CSW contacted patient to discuss how to obtain his medications while in [place]. Patient states he has about one week left and unable to get in [place] due to out of state medicaid. CSW encouraged patient to follow up with [facility] and have them mailed to him. Patient verbalized understanding and will return call to CSW if needed. Lasandra Beech, LCSW, CCSW-MCS [phone]

## 2018-09-09 ENCOUNTER — Telehealth (HOSPITAL_COMMUNITY): Payer: Self-pay

## 2018-09-09 ENCOUNTER — Telehealth (HOSPITAL_COMMUNITY): Payer: Self-pay | Admitting: *Deleted

## 2018-09-09 MED FILL — SILDENAFIL CITRATE 20 MG TA: 20 | 30 days supply | Qty: 90 | Fill #1

## 2018-09-09 MED FILL — SERTRALINE HCL 50 MG TABS: 50 | 30 days supply | Qty: 30 | Fill #0

## 2018-09-09 MED FILL — GABAPENTIN 400 MG CAPSULE: 400 | 30 days supply | Qty: 60 | Fill #1

## 2018-09-09 MED FILL — SERTRALINE HCL 25 MG TABLET: 25 | 30 days supply | Qty: 30 | Fill #1

## 2018-09-09 MED FILL — SPIRONOLACTONE 25 MG TABS: 25 | 30 days supply | Qty: 30 | Fill #1

## 2018-09-09 NOTE — Telephone Encounter (Signed)
Pt left VM reporting he needed refills for medications and he could not be seen at his new dr office because his insurance have not kicked in.  Pt looking to get refills to hold him over until his appointment.   Carl Ferguson- pt states you were in communication with someone from his drs office in [place]. Please call patient

## 2018-09-09 NOTE — Telephone Encounter (Signed)
Patient called regarding needing medication refills and dressing kits. Annice Pih had spoken with patient last week and instructed patient to call [facility] for medication refills. Patient states they only sent him two of his prescriptions. Instructed him to call pharmacy and request 3 month refills on all of his medications and if the pharmacy had questions they can can call VAD coordinators at the clinic. Patient verbalized understanding.   Shipped patient 10 weekly dressing kits.   [name] RN VAD Coordinator  Office: [phone]  24/7 Pager: [phone]

## 2018-09-18 ENCOUNTER — Telehealth (HOSPITAL_COMMUNITY): Payer: Self-pay | Admitting: Pharmacist

## 2018-09-18 NOTE — Telephone Encounter (Signed)
Missing INR LMOM to please go to lab and have drawn and fax to HF clinic  Leota Sauers Pharm.D. CPP, BCPS Clinical Pharmacist [phone] 09/18/2018 9:58 AM

## 2018-10-03 ENCOUNTER — Telehealth (HOSPITAL_COMMUNITY): Payer: Self-pay | Admitting: Pharmacist

## 2018-10-03 NOTE — Telephone Encounter (Signed)
Spoke to patient  He has ben taking his warfarin as directed but has not had INR checked since May, despite phone calls reminding him   He now has [facility] and scheduled visit with LVAD MD in [place] on Tues.   They will assume care per patient.    Bonnita Nasuti Pharm.D. CPP, BCPS Clinical Pharmacist [phone] 10/03/2018 3:28 PM

## 2018-10-13 ENCOUNTER — Encounter: Payer: Medicaid Other | Admitting: *Deleted

## 2018-10-14 ENCOUNTER — Telehealth: Payer: Self-pay

## 2018-10-14 NOTE — Telephone Encounter (Signed)
Left message for patient to remind of missed remote transmission.  

## 2018-10-28 ENCOUNTER — Other Ambulatory Visit: Payer: Self-pay | Admitting: Cardiology

## 2018-10-28 DIAGNOSIS — Z95811 Presence of heart assist device: Secondary | ICD-10-CM

## 2018-10-28 DIAGNOSIS — I428 Other cardiomyopathies: Secondary | ICD-10-CM

## 2018-10-28 DIAGNOSIS — I5022 Chronic systolic (congestive) heart failure: Secondary | ICD-10-CM

## 2018-10-28 DIAGNOSIS — M792 Neuralgia and neuritis, unspecified: Secondary | ICD-10-CM

## 2018-11-24 ENCOUNTER — Telehealth (HOSPITAL_COMMUNITY): Payer: Self-pay | Admitting: *Deleted

## 2018-11-24 NOTE — Telephone Encounter (Signed)
Received call from [facility] ED in [facility] re: pt currently in their ED with c/o SOB. ED MD plans on discharging patient home and wants to speak with Dr. Aundra Dubin.   Dr. Delorse Lek # given to Dr. Aundra Dubin with request for same.   Dr. Aundra Dubin called Dr. Laverta [place] with request for [place] to contact patient for INR management and f/u.   Called pt and spoke with him about the following:  1. Pt currently living with Joycelyn Schmid in [place], but "doesn't think it is going to work out". He says      he was fine living with his mother, had "no problems".   2. Pt unsure where he will be living when he leaves Margaret's home.  3. Asked pt if he has dressing supplies - he does.  4. Asked pt if he has INR f/u established here, he does not.   5. Pt reports his INR in ED today was 4.5. Instructed pt to call [place] VAD center for warfarin        management. We do not know his current dose of warfarin.  6. Pt admits to leaving [place] without plan in place for transfer of care to here. Says it "took too      long" and he "had to get out of [place] quickly".  7. Pt may or may not re-locate to [place] since things aren't working out with Joycelyn Schmid. Asked pt to      call us if he decides to stay. Pt says he would then look for low income housing.  8. Pt says he has plenty of medications, but reminded him to contact [place] for refills      BEFORE running out of meds.  9. Pt transitioned to [facility] He will plan on working to switch back to [facility] if he       decides to stay.   10. Asked pt to call VAD clinic if he decides to re-locate to [facility] and re-establish care with                             [facility]. Pt verbalized understanding of same.   Zada Girt RN, [place] Coordinator [phone]

## 2018-12-19 ENCOUNTER — Encounter (HOSPITAL_COMMUNITY): Payer: Self-pay | Admitting: Emergency Medicine

## 2018-12-19 ENCOUNTER — Other Ambulatory Visit: Payer: Self-pay

## 2018-12-19 ENCOUNTER — Emergency Department (HOSPITAL_COMMUNITY): Payer: Medicaid Other

## 2018-12-19 ENCOUNTER — Emergency Department (HOSPITAL_COMMUNITY)
Admission: EM | Admit: 2018-12-19 | Discharge: 2018-12-19 | Disposition: A | Discharge status: Home / Self Care | Payer: Medicaid Other | Attending: Emergency Medicine | Admitting: Emergency Medicine

## 2018-12-19 DIAGNOSIS — Z20828 Contact with and (suspected) exposure to other viral communicable diseases: Secondary | ICD-10-CM | POA: Insufficient documentation

## 2018-12-19 DIAGNOSIS — Z95811 Presence of heart assist device: Secondary | ICD-10-CM | POA: Insufficient documentation

## 2018-12-19 DIAGNOSIS — Z79899 Other long term (current) drug therapy: Secondary | ICD-10-CM | POA: Insufficient documentation

## 2018-12-19 DIAGNOSIS — I428 Other cardiomyopathies: Secondary | ICD-10-CM

## 2018-12-19 DIAGNOSIS — I110 Hypertensive heart disease with heart failure: Secondary | ICD-10-CM | POA: Insufficient documentation

## 2018-12-19 DIAGNOSIS — R0602 Shortness of breath: Secondary | ICD-10-CM | POA: Insufficient documentation

## 2018-12-19 DIAGNOSIS — Z87891 Personal history of nicotine dependence: Secondary | ICD-10-CM | POA: Insufficient documentation

## 2018-12-19 DIAGNOSIS — Z7901 Long term (current) use of anticoagulants: Secondary | ICD-10-CM | POA: Insufficient documentation

## 2018-12-19 DIAGNOSIS — I5022 Chronic systolic (congestive) heart failure: Secondary | ICD-10-CM

## 2018-12-19 DIAGNOSIS — J449 Chronic obstructive pulmonary disease, unspecified: Secondary | ICD-10-CM | POA: Insufficient documentation

## 2018-12-19 DIAGNOSIS — M792 Neuralgia and neuritis, unspecified: Secondary | ICD-10-CM

## 2018-12-19 LAB — CBC WITH DIFFERENTIAL/PLATELET
Abs Immature Granulocytes: 0.03 10*3/uL (ref 0.00–0.07)
Basophils Absolute: 0.1 10*3/uL (ref 0.0–0.1)
Basophils Relative: 1 %
Eosinophils Absolute: 0.1 10*3/uL (ref 0.0–0.5)
Eosinophils Relative: 1 %
HCT: 44.2 % (ref 39.0–52.0)
Hemoglobin: 15.5 g/dL (ref 13.0–17.0)
Immature Granulocytes: 0 %
Lymphocytes Relative: 22 %
Lymphs Abs: 2.2 10*3/uL (ref 0.7–4.0)
MCH: 33.3 pg (ref 26.0–34.0)
MCHC: 35.1 g/dL (ref 30.0–36.0)
MCV: 95.1 fL (ref 80.0–100.0)
Monocytes Absolute: 1.2 10*3/uL — ABNORMAL HIGH (ref 0.1–1.0)
Monocytes Relative: 13 %
Neutro Abs: 6.3 10*3/uL (ref 1.7–7.7)
Neutrophils Relative %: 63 %
Platelets: 174 10*3/uL (ref 150–400)
RBC: 4.65 MIL/uL (ref 4.22–5.81)
RDW: 13.9 % (ref 11.5–15.5)
WBC: 9.9 10*3/uL (ref 4.0–10.5)
nRBC: 0 % (ref 0.0–0.2)

## 2018-12-19 LAB — LACTATE DEHYDROGENASE: LDH: 238 U/L — ABNORMAL HIGH (ref 98–192)

## 2018-12-19 LAB — RAPID URINE DRUG SCREEN, HOSP PERFORMED
Amphetamines: NOT DETECTED
Barbiturates: POSITIVE — AB
Benzodiazepines: NOT DETECTED
Cocaine: POSITIVE — AB
Opiates: NOT DETECTED
Tetrahydrocannabinol: NOT DETECTED

## 2018-12-19 LAB — BASIC METABOLIC PANEL
Anion gap: 11 (ref 5–15)
BUN: 13 mg/dL (ref 6–20)
CO2: 20 mmol/L — ABNORMAL LOW (ref 22–32)
Calcium: 9.2 mg/dL (ref 8.9–10.3)
Chloride: 106 mmol/L (ref 98–111)
Creatinine, Ser: 1 mg/dL (ref 0.61–1.24)
GFR calc Af Amer: >60 mL/min (ref >60)
GFR calc non Af Amer: >60 mL/min (ref >60)
Glucose, Bld: 94 mg/dL (ref 70–99)
Potassium: 4 mmol/L (ref 3.5–5.1)
Sodium: 137 mmol/L (ref 135–145)

## 2018-12-19 LAB — ETHANOL: Alcohol, Ethyl (B): 137 mg/dL — ABNORMAL HIGH (ref <10)

## 2018-12-19 LAB — PROTIME-INR
INR: 4 — ABNORMAL HIGH (ref 0.8–1.2)
Prothrombin Time: 38.2 seconds — ABNORMAL HIGH (ref 11.4–15.2)

## 2018-12-19 MED ORDER — OMEPRAZOLE 20 MG PO CPDR: 20.0000 mg | DELAYED_RELEASE_CAPSULE | Freq: Every day | ORAL | 0 refills | Status: DC

## 2018-12-19 MED ORDER — SERTRALINE HCL 50 MG PO TABS: 50.0000 mg | ORAL_TABLET | Freq: Every day | ORAL | 0 refills | Status: DC

## 2018-12-19 MED ORDER — GABAPENTIN 400 MG PO CAPS: 400.0000 mg | ORAL_CAPSULE | Freq: Two times a day (BID) | ORAL | 0 refills | Status: DC

## 2018-12-19 MED ORDER — SILDENAFIL CITRATE 20 MG PO TABS: 20.0000 mg | ORAL_TABLET | Freq: Three times a day (TID) | ORAL | 0 refills | Status: DC

## 2018-12-19 MED ORDER — WARFARIN SODIUM 3 MG PO TABS: 6.0000 mg | ORAL_TABLET | Freq: Every day | ORAL | 0 refills | Status: DC

## 2018-12-19 MED ORDER — ALBUTEROL SULFATE HFA 108 (90 BASE) MCG/ACT IN AERS: 2.0000 | INHALATION_SPRAY | Freq: Four times a day (QID) | RESPIRATORY_TRACT | 0 refills | Status: DC | PRN

## 2018-12-19 MED ORDER — LOSARTAN POTASSIUM 25 MG PO TABS: 50.0000 mg | ORAL_TABLET | Freq: Every day | ORAL | 0 refills | Status: DC

## 2018-12-19 MED ORDER — SPIRONOLACTONE 25 MG PO TABS: 25.0000 mg | ORAL_TABLET | Freq: Every day | ORAL | 2 refills | Status: DC

## 2018-12-19 MED ORDER — ASPIRIN EC 81 MG PO TBEC: 81.0000 mg | DELAYED_RELEASE_TABLET | Freq: Every day | ORAL | 0 refills | Status: AC

## 2018-12-19 NOTE — ED Notes (Signed)
  Patient discharged after social work consult.  Patient refused discharge vital signs.

## 2018-12-19 NOTE — Progress Notes (Addendum)
Received call from ER RN stating that patient had arrived to ER c/o shortness of breath. Per his report he has been "drinking a lot of alcohol and getting high on cocaine." Reported to ER RN that he is out of all of his medications. Rapid Response RN brought VAD cart to ER; VAD numbers stable. Dr Aundra Dubin aware of patient in ER. Will obtain labs and chest xray per MD order.   Emerson Monte RN [place] Coordinator  Office: [phone]  24/7 Pager: [phone]

## 2018-12-19 NOTE — ED Notes (Signed)
LVAD RN paged

## 2018-12-19 NOTE — Care Management (Signed)
Patient reinstated in [facility] program for medication assistance., copay waived as patient is homeless.  Letter printed and given to patient with instructions on how to redeem.  [facility] [address].  Patient has been given voucher for transportation to pharmacy to pick up meds

## 2018-12-19 NOTE — ED Notes (Signed)
Pt refusing to wear cardiac monitor

## 2018-12-19 NOTE — ED Notes (Signed)
Patient connected to heart mate cart, wall power.   Flow 5.1 Speed 5700 PI 3.8-4.6 Pump power 4.6
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
## 2018-12-19 NOTE — ED Notes (Signed)
LVAD pt  BP 98 systolic doppler

## 2018-12-19 NOTE — Progress Notes (Signed)
Consult request has been received. CSW attempting to follow up at present time. CSW aware that pt in need of substance abuse resources along with assistance getting mediations.   [facility]  Clinical Social Worker  Ph: [phone]
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
## 2018-12-19 NOTE — ED Provider Notes (Signed)
I saw and evaluated the patient, reviewed the resident's note and I agree with the findings and plan.  EKG:   53 year old male with history of non ischemic cardiomyopathy presents with increased dyspnea.  Patient states his been out of his medications for several days.  Also admits to using cocaine and alcohol.  Will order x-rays and lab testing likely admit   Lacretia Leigh, MD 12/19/18 [phone]

## 2018-12-19 NOTE — Progress Notes (Signed)
CSW at bedside to address consult. CSW learned from patient that he was seeking inpatient detox treatment. CSW in contact with [facility] who states that they have a bed available for pt upon completion of screening and meeting admission criteria. [facility] explains that because Pt has LVAD he does not meet inpatient criteria becuase facility is not equipped to meet pt needs.  CSW and Nurse CM at bedside to discuss referral and other options for shelter. CSW provided pt with outpatient substance abuse treatment information. CSW will provide pt with taxi cab voucher to pick up mediations from [facility] on [address].   Pts information was provided to [facility]. [place] explains that they will add him to the waiting list. CSW also offered pt information on [facility].   [place]  Clinical Social Worker  Ph: [phone]

## 2018-12-20 LAB — SARS CORONAVIRUS 2 (TAT 6-24 HRS): SARS Coronavirus 2: NEGATIVE

## 2019-01-28 ENCOUNTER — Telehealth (HOSPITAL_COMMUNITY): Payer: Self-pay | Admitting: Licensed Clinical Social Worker

## 2019-01-28 NOTE — Telephone Encounter (Signed)
Patient called CSW to inquire about process to be seen back in VAD clinic. Patient reports he has returned from [place] to reside in [place] permanently. He states that he and Joycelyn Schmid his former girlfriend have reunited and he is now residing with her. He states that he has contacted Social security to change his address to [facility] for his benefits. CSW informed patient that he must go to [place] to complete a medicaid application for Medicaid benefits to be transferred from [place]. Patient also instructed to contact his VAD Team in [place] and request a transfer of care to [facility]. Patient also mentioned that he needs his INR checked and CSW was informed to tell patient to contact his VAD team in [place]  to send an order to a local lab. Patient verbalizes understanding and will follow up as instructed. CSW available as needed. Raquel Sarna, [place], [place]

## 2019-01-29 ENCOUNTER — Telehealth (HOSPITAL_COMMUNITY): Payer: Self-pay | Admitting: *Deleted

## 2019-01-29 ENCOUNTER — Telehealth (HOSPITAL_COMMUNITY): Payer: Self-pay | Admitting: Licensed Clinical Social Worker

## 2019-01-29 NOTE — Telephone Encounter (Signed)
Patient called to request assistance with getting care transferred back to [facility]. Patient states he called [facility] in [place] where he previously received care and they are requesting call from [place] to [place] @ [phone]. CSW shared that Zada Girt, [place] has been trying to reach patient with no ability to leave message on his phone. Patient is scheduled for a lab appointment on Monday October 19 at [facility] in the VAD in the clinic. CSW will follow up with patient at that time.Raquel Sarna, [place], [place]

## 2019-01-29 NOTE — Telephone Encounter (Signed)
Pt reached out to [place], [place] asking about f/u.  Lab only appt scheduled for Monday, 01/29/19 at 9:00 am. Kennyth Lose will relay message to patient.  Zada Girt RN, [place] Coordinator [phone]

## 2019-01-29 NOTE — Telephone Encounter (Signed)
Pt called VAD office and left message asking for return call. Pt says he has moved back to this area and needs to speak with [place]. Return call, no answer, unable to leave message "no voicemail has been set up".  Zada Girt RN, [place] Coordinator [phone]

## 2019-01-30 ENCOUNTER — Other Ambulatory Visit (HOSPITAL_COMMUNITY): Payer: Self-pay | Admitting: Unknown Physician Specialty

## 2019-01-30 DIAGNOSIS — Z7901 Long term (current) use of anticoagulants: Secondary | ICD-10-CM

## 2019-01-30 DIAGNOSIS — Z95811 Presence of heart assist device: Secondary | ICD-10-CM

## 2019-02-02 ENCOUNTER — Ambulatory Visit (HOSPITAL_COMMUNITY): Payer: Self-pay | Admitting: Pharmacist

## 2019-02-02 ENCOUNTER — Other Ambulatory Visit: Payer: Self-pay

## 2019-02-02 ENCOUNTER — Ambulatory Visit (HOSPITAL_COMMUNITY)
Admission: RE | Admit: 2019-02-02 | Discharge: 2019-02-02 | Disposition: A | Discharge status: Home / Self Care | Payer: Medicaid Other | Source: Ambulatory Visit | Attending: Cardiology | Admitting: Cardiology

## 2019-02-02 ENCOUNTER — Other Ambulatory Visit: Payer: Self-pay | Admitting: Unknown Physician Specialty

## 2019-02-02 DIAGNOSIS — Z95811 Presence of heart assist device: Secondary | ICD-10-CM | POA: Insufficient documentation

## 2019-02-02 DIAGNOSIS — Z7901 Long term (current) use of anticoagulants: Secondary | ICD-10-CM | POA: Diagnosis not present

## 2019-02-02 LAB — COMPREHENSIVE METABOLIC PANEL
ALT: 41 U/L (ref 0–44)
AST: 83 U/L — ABNORMAL HIGH (ref 15–41)
Albumin: 3.9 g/dL (ref 3.5–5.0)
Alkaline Phosphatase: 115 U/L (ref 38–126)
Anion gap: 11 (ref 5–15)
BUN: 8 mg/dL (ref 6–20)
CO2: 22 mmol/L (ref 22–32)
Calcium: 8.9 mg/dL (ref 8.9–10.3)
Chloride: 103 mmol/L (ref 98–111)
Creatinine, Ser: 0.93 mg/dL (ref 0.61–1.24)
GFR calc Af Amer: >60 mL/min (ref >60)
GFR calc non Af Amer: >60 mL/min (ref >60)
Glucose, Bld: 111 mg/dL — ABNORMAL HIGH (ref 70–99)
Potassium: 4 mmol/L (ref 3.5–5.1)
Sodium: 136 mmol/L (ref 135–145)
Total Bilirubin: 0.7 mg/dL (ref 0.3–1.2)
Total Protein: 8.2 g/dL — ABNORMAL HIGH (ref 6.5–8.1)

## 2019-02-02 LAB — CBC
HCT: 46.8 % (ref 39.0–52.0)
Hemoglobin: 16.5 g/dL (ref 13.0–17.0)
MCH: 33.6 pg (ref 26.0–34.0)
MCHC: 35.3 g/dL (ref 30.0–36.0)
MCV: 95.3 fL (ref 80.0–100.0)
Platelets: 189 10*3/uL (ref 150–400)
RBC: 4.91 MIL/uL (ref 4.22–5.81)
RDW: 14.6 % (ref 11.5–15.5)
WBC: 7.2 10*3/uL (ref 4.0–10.5)
nRBC: 0 % (ref 0.0–0.2)

## 2019-02-02 LAB — PROTIME-INR
INR: 1.2 (ref 0.8–1.2)
Prothrombin Time: 15.1 seconds (ref 11.4–15.2)

## 2019-02-02 LAB — LACTATE DEHYDROGENASE: LDH: 236 U/L — ABNORMAL HIGH (ref 98–192)

## 2019-02-02 MED ORDER — ENOXAPARIN SODIUM 30 MG/0.3ML ~~LOC~~ SOLN: 30.0000 mg | Freq: Two times a day (BID) | SUBCUTANEOUS | 0 refills | Status: DC

## 2019-02-02 NOTE — Progress Notes (Signed)
CSW met with patient in the lab. Patient states he was "sick' last night but managing his VAD with no issues at this time. He requests some dressing kits as he is out. CSW Provided patient with dressing kits and a follow up appointment in the VAD clinic as he has not been seen recently. Patient will return to [facility] clinic on Tuesday October 27th at 10am. Patient grateful for the assistance and support. CSW continues to follow for supportive needs. Raquel Sarna, [place], [place]

## 2019-02-02 NOTE — Progress Notes (Signed)
LVAD INR 

## 2019-02-02 NOTE — Addendum Note (Signed)
Encounter addended by: Louann Liv, LCSW on: 02/02/2019 10:09 AM  Actions taken: Clinical Note Signed

## 2019-02-05 ENCOUNTER — Other Ambulatory Visit (HOSPITAL_COMMUNITY): Payer: Self-pay | Admitting: Unknown Physician Specialty

## 2019-02-05 DIAGNOSIS — Z95811 Presence of heart assist device: Secondary | ICD-10-CM

## 2019-02-05 DIAGNOSIS — Z7901 Long term (current) use of anticoagulants: Secondary | ICD-10-CM

## 2019-02-06 ENCOUNTER — Encounter (HOSPITAL_COMMUNITY)
Admission: RE | Admit: 2019-02-06 | Discharge: 2019-02-06 | Disposition: A | Discharge status: Home / Self Care | Payer: Medicaid Other | Source: Ambulatory Visit | Attending: Cardiology | Admitting: Cardiology

## 2019-02-06 ENCOUNTER — Ambulatory Visit (HOSPITAL_COMMUNITY): Payer: Self-pay | Admitting: Pharmacist

## 2019-02-06 DIAGNOSIS — Z95811 Presence of heart assist device: Secondary | ICD-10-CM | POA: Insufficient documentation

## 2019-02-06 DIAGNOSIS — Z7901 Long term (current) use of anticoagulants: Secondary | ICD-10-CM | POA: Diagnosis present

## 2019-02-06 LAB — PROTIME-INR
INR: 1.7 — ABNORMAL HIGH (ref 0.8–1.2)
Prothrombin Time: 19.6 seconds — ABNORMAL HIGH (ref 11.4–15.2)

## 2019-02-06 NOTE — Progress Notes (Signed)
LVAD INR 

## 2019-02-09 ENCOUNTER — Other Ambulatory Visit (HOSPITAL_COMMUNITY): Payer: Self-pay | Admitting: *Deleted

## 2019-02-09 DIAGNOSIS — Z95811 Presence of heart assist device: Secondary | ICD-10-CM

## 2019-02-09 DIAGNOSIS — Z7901 Long term (current) use of anticoagulants: Secondary | ICD-10-CM

## 2019-02-10 ENCOUNTER — Ambulatory Visit (HOSPITAL_COMMUNITY)
Admission: RE | Admit: 2019-02-10 | Discharge: 2019-02-10 | Disposition: A | Discharge status: Home / Self Care | Payer: Medicaid Other | Source: Ambulatory Visit | Attending: Cardiology | Admitting: Cardiology

## 2019-02-10 ENCOUNTER — Encounter (HOSPITAL_COMMUNITY): Payer: Self-pay

## 2019-02-10 ENCOUNTER — Ambulatory Visit (HOSPITAL_COMMUNITY): Payer: Self-pay | Admitting: Pharmacist

## 2019-02-10 ENCOUNTER — Other Ambulatory Visit: Payer: Self-pay

## 2019-02-10 ENCOUNTER — Other Ambulatory Visit (HOSPITAL_COMMUNITY): Payer: Self-pay | Admitting: *Deleted

## 2019-02-10 VITALS — BP 118/96 | HR 81 | Wt 160.4 lb

## 2019-02-10 DIAGNOSIS — Z95811 Presence of heart assist device: Secondary | ICD-10-CM | POA: Diagnosis not present

## 2019-02-10 DIAGNOSIS — Z79899 Other long term (current) drug therapy: Secondary | ICD-10-CM | POA: Insufficient documentation

## 2019-02-10 DIAGNOSIS — I472 Ventricular tachycardia: Secondary | ICD-10-CM | POA: Insufficient documentation

## 2019-02-10 DIAGNOSIS — Z7901 Long term (current) use of anticoagulants: Secondary | ICD-10-CM | POA: Insufficient documentation

## 2019-02-10 DIAGNOSIS — I428 Other cardiomyopathies: Secondary | ICD-10-CM | POA: Insufficient documentation

## 2019-02-10 DIAGNOSIS — Z7951 Long term (current) use of inhaled steroids: Secondary | ICD-10-CM | POA: Diagnosis not present

## 2019-02-10 DIAGNOSIS — Z7982 Long term (current) use of aspirin: Secondary | ICD-10-CM | POA: Diagnosis not present

## 2019-02-10 DIAGNOSIS — F1410 Cocaine abuse, uncomplicated: Secondary | ICD-10-CM | POA: Insufficient documentation

## 2019-02-10 DIAGNOSIS — F1010 Alcohol abuse, uncomplicated: Secondary | ICD-10-CM | POA: Diagnosis not present

## 2019-02-10 DIAGNOSIS — I5022 Chronic systolic (congestive) heart failure: Secondary | ICD-10-CM | POA: Insufficient documentation

## 2019-02-10 DIAGNOSIS — I447 Left bundle-branch block, unspecified: Secondary | ICD-10-CM | POA: Insufficient documentation

## 2019-02-10 DIAGNOSIS — F329 Major depressive disorder, single episode, unspecified: Secondary | ICD-10-CM | POA: Diagnosis not present

## 2019-02-10 DIAGNOSIS — T827XXA Infection and inflammatory reaction due to other cardiac and vascular devices, implants and grafts, initial encounter: Secondary | ICD-10-CM

## 2019-02-10 DIAGNOSIS — R0600 Dyspnea, unspecified: Secondary | ICD-10-CM | POA: Diagnosis not present

## 2019-02-10 DIAGNOSIS — I340 Nonrheumatic mitral (valve) insufficiency: Secondary | ICD-10-CM | POA: Insufficient documentation

## 2019-02-10 DIAGNOSIS — F17210 Nicotine dependence, cigarettes, uncomplicated: Secondary | ICD-10-CM | POA: Diagnosis not present

## 2019-02-10 DIAGNOSIS — I10 Essential (primary) hypertension: Secondary | ICD-10-CM

## 2019-02-10 DIAGNOSIS — R0609 Other forms of dyspnea: Secondary | ICD-10-CM

## 2019-02-10 DIAGNOSIS — M792 Neuralgia and neuritis, unspecified: Secondary | ICD-10-CM

## 2019-02-10 LAB — COMPREHENSIVE METABOLIC PANEL
ALT: 79 U/L — ABNORMAL HIGH (ref 0–44)
AST: 116 U/L — ABNORMAL HIGH (ref 15–41)
Albumin: 3.9 g/dL (ref 3.5–5.0)
Alkaline Phosphatase: 114 U/L (ref 38–126)
Anion gap: 12 (ref 5–15)
BUN: <5 mg/dL — ABNORMAL LOW (ref 6–20)
CO2: 20 mmol/L — ABNORMAL LOW (ref 22–32)
Calcium: 9.2 mg/dL (ref 8.9–10.3)
Chloride: 104 mmol/L (ref 98–111)
Creatinine, Ser: 0.77 mg/dL (ref 0.61–1.24)
GFR calc Af Amer: >60 mL/min (ref >60)
GFR calc non Af Amer: >60 mL/min (ref >60)
Glucose, Bld: 101 mg/dL — ABNORMAL HIGH (ref 70–99)
Potassium: 3.8 mmol/L (ref 3.5–5.1)
Sodium: 136 mmol/L (ref 135–145)
Total Bilirubin: 0.3 mg/dL (ref 0.3–1.2)
Total Protein: 8 g/dL (ref 6.5–8.1)

## 2019-02-10 LAB — CBC
HCT: 45.3 % (ref 39.0–52.0)
Hemoglobin: 16 g/dL (ref 13.0–17.0)
MCH: 33.8 pg (ref 26.0–34.0)
MCHC: 35.3 g/dL (ref 30.0–36.0)
MCV: 95.6 fL (ref 80.0–100.0)
Platelets: 123 10*3/uL — ABNORMAL LOW (ref 150–400)
RBC: 4.74 MIL/uL (ref 4.22–5.81)
RDW: 15 % (ref 11.5–15.5)
WBC: 7.1 10*3/uL (ref 4.0–10.5)
nRBC: 0 % (ref 0.0–0.2)

## 2019-02-10 LAB — LACTATE DEHYDROGENASE: LDH: 226 U/L — ABNORMAL HIGH (ref 98–192)

## 2019-02-10 LAB — FOLATE: Folate: 8 ng/mL (ref >5.9)

## 2019-02-10 LAB — VITAMIN B12: Vitamin B-12: 896 pg/mL (ref 180–914)

## 2019-02-10 LAB — PROTIME-INR
INR: 2.7 — ABNORMAL HIGH (ref 0.8–1.2)
Prothrombin Time: 28.2 seconds — ABNORMAL HIGH (ref 11.4–15.2)

## 2019-02-10 LAB — PREALBUMIN: Prealbumin: 20.1 mg/dL (ref 18–38)

## 2019-02-10 LAB — LIPID PANEL
Cholesterol: 127 mg/dL (ref 0–200)
HDL: 44 mg/dL (ref >40)
LDL Cholesterol: 66 mg/dL (ref 0–99)
Total CHOL/HDL Ratio: 2.9 RATIO
Triglycerides: 83 mg/dL (ref <150)
VLDL: 17 mg/dL (ref 0–40)

## 2019-02-10 LAB — IRON AND TIBC
Iron: 41 ug/dL — ABNORMAL LOW (ref 45–182)
Saturation Ratios: 11 % — ABNORMAL LOW (ref 17.9–39.5)
TIBC: 360 ug/dL (ref 250–450)
UIBC: 319 ug/dL

## 2019-02-10 LAB — FERRITIN: Ferritin: 61 ng/mL (ref 24–336)

## 2019-02-10 LAB — MAGNESIUM: Magnesium: 1.9 mg/dL (ref 1.7–2.4)

## 2019-02-10 MED ORDER — LOSARTAN POTASSIUM 50 MG PO TABS: 50.0000 mg | ORAL_TABLET | Freq: Two times a day (BID) | ORAL | 5 refills | Status: DC

## 2019-02-10 MED ORDER — SILDENAFIL CITRATE 20 MG PO TABS: 20.0000 mg | ORAL_TABLET | Freq: Three times a day (TID) | ORAL | 6 refills | Status: DC

## 2019-02-10 MED ORDER — ALBUTEROL SULFATE HFA 108 (90 BASE) MCG/ACT IN AERS: 2.0000 | INHALATION_SPRAY | Freq: Four times a day (QID) | RESPIRATORY_TRACT | 6 refills | Status: AC | PRN

## 2019-02-10 MED ORDER — DOXYCYCLINE HYCLATE 100 MG PO CAPS: 100.0000 mg | ORAL_CAPSULE | Freq: Two times a day (BID) | ORAL | 0 refills | Status: AC

## 2019-02-10 NOTE — Addendum Note (Signed)
Encounter addended by: Mertha Baars, RN on: 02/10/2019 11:35 AM  Actions taken: Order Reconciliation Section accessed, Medication long-term status modified, Clinical Note Signed, Visit diagnoses modified, Pharmacy for encounter modified, Order list changed, Diagnosis association updated

## 2019-02-10 NOTE — Addendum Note (Signed)
Encounter addended by: Larey Dresser, MD on: 02/10/2019 11:56 PM  Actions taken: Clinical Note Signed, Charge Capture section accepted, Level of Service modified

## 2019-02-10 NOTE — Addendum Note (Signed)
Encounter addended by: Mertha Baars, RN on: 02/10/2019 11:19 AM  Actions taken: Vitals modified, Order Reconciliation Section accessed

## 2019-02-10 NOTE — Addendum Note (Signed)
Encounter addended by: Mertha Baars, RN on: 02/10/2019 2:26 PM  Actions taken: Clinical Note Signed

## 2019-02-10 NOTE — Addendum Note (Signed)
Encounter addended by: Mertha Baars, RN on: 02/10/2019 2:11 PM  Actions taken: Pend clinical note

## 2019-02-10 NOTE — Patient Instructions (Signed)
1. Increase Losartan 4m to twice a day 2. Start Doxycycline 1066mtwice a day 3. Coumadin dosing per LaAnder PurpuraharmD 4. Daily dressing changes for the next 2 weeks with daily dressing kit and silver strip  5. We will schedule you for an echo and call you with date and time 6. [facility]  7. You need to make an appointment with Dr TaLovena Leo have your device checked: [phone]

## 2019-02-10 NOTE — Progress Notes (Signed)
LVAD INR 
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
## 2019-02-10 NOTE — Addendum Note (Signed)
Encounter addended by: Mertha Baars, RN on: 02/10/2019 11:10 AM  Actions taken: Medication List reviewed, Medication taking status modified, Visit diagnoses modified, Pharmacy for encounter modified, Order list changed, Diagnosis association updated, Order Reconciliation Section accessed, Home Medications modified

## 2019-02-19 ENCOUNTER — Other Ambulatory Visit (HOSPITAL_COMMUNITY): Payer: Self-pay | Admitting: Unknown Physician Specialty

## 2019-02-19 ENCOUNTER — Other Ambulatory Visit (HOSPITAL_COMMUNITY): Payer: Self-pay | Admitting: *Deleted

## 2019-02-19 DIAGNOSIS — I10 Essential (primary) hypertension: Secondary | ICD-10-CM

## 2019-02-19 DIAGNOSIS — Z95811 Presence of heart assist device: Secondary | ICD-10-CM

## 2019-02-19 DIAGNOSIS — Z7901 Long term (current) use of anticoagulants: Secondary | ICD-10-CM

## 2019-02-19 LAB — BARBITURATES,MS,WB/SP RFX
Amobarbital: NEGATIVE ug/mL
Barbiturates Confirmation: POSITIVE
Butabarbital: NEGATIVE ug/mL
Butalbital: 1.4 ug/mL
Pentobarbital: NEGATIVE ug/mL
Phenobarbital: NEGATIVE ug/mL
Secobarbital: NEGATIVE ug/mL

## 2019-02-19 LAB — DRUG SCREEN 10 W/CONF, SERUM
Amphetamines, IA: NEGATIVE ng/mL
Barbiturates, IA: POSITIVE ug/mL — AB
Benzodiazepines, IA: NEGATIVE ng/mL
Cocaine & Metabolite, IA: NEGATIVE ng/mL
Methadone, IA: NEGATIVE ng/mL
Opiates, IA: NEGATIVE ng/mL
Oxycodones, IA: NEGATIVE ng/mL
Phencyclidine, IA: NEGATIVE ng/mL
Propoxyphene, IA: NEGATIVE ng/mL
THC(Marijuana) Metabolite, IA: NEGATIVE ng/mL

## 2019-02-19 MED ORDER — LOSARTAN POTASSIUM 50 MG PO TABS: 50.0000 mg | ORAL_TABLET | Freq: Two times a day (BID) | ORAL | 5 refills | Status: DC

## 2019-02-23 ENCOUNTER — Other Ambulatory Visit (HOSPITAL_COMMUNITY): Payer: Medicaid Other

## 2019-02-23 ENCOUNTER — Ambulatory Visit (HOSPITAL_COMMUNITY)
Admission: RE | Admit: 2019-02-23 | Discharge: 2019-02-23 | Disposition: A | Discharge status: Home / Self Care | Payer: Medicaid Other | Source: Ambulatory Visit | Attending: Cardiology | Admitting: Cardiology

## 2019-02-23 ENCOUNTER — Ambulatory Visit (HOSPITAL_BASED_OUTPATIENT_CLINIC_OR_DEPARTMENT_OTHER)
Admission: RE | Admit: 2019-02-23 | Discharge: 2019-02-23 | Disposition: A | Discharge status: Home / Self Care | Payer: Medicaid Other | Source: Ambulatory Visit | Attending: Cardiology | Admitting: Cardiology

## 2019-02-23 ENCOUNTER — Other Ambulatory Visit: Payer: Self-pay

## 2019-02-23 ENCOUNTER — Ambulatory Visit (HOSPITAL_COMMUNITY): Payer: Self-pay | Admitting: Pharmacist

## 2019-02-23 VITALS — BP 108/95 | HR 79 | Temp 97.8°F | Ht 67.0 in | Wt 160.2 lb

## 2019-02-23 DIAGNOSIS — F1410 Cocaine abuse, uncomplicated: Secondary | ICD-10-CM | POA: Diagnosis not present

## 2019-02-23 DIAGNOSIS — I340 Nonrheumatic mitral (valve) insufficiency: Secondary | ICD-10-CM

## 2019-02-23 DIAGNOSIS — I10 Essential (primary) hypertension: Secondary | ICD-10-CM

## 2019-02-23 DIAGNOSIS — Z95811 Presence of heart assist device: Secondary | ICD-10-CM

## 2019-02-23 DIAGNOSIS — I447 Left bundle-branch block, unspecified: Secondary | ICD-10-CM | POA: Insufficient documentation

## 2019-02-23 DIAGNOSIS — Z9889 Other specified postprocedural states: Secondary | ICD-10-CM | POA: Insufficient documentation

## 2019-02-23 DIAGNOSIS — Z79899 Other long term (current) drug therapy: Secondary | ICD-10-CM | POA: Insufficient documentation

## 2019-02-23 DIAGNOSIS — I5022 Chronic systolic (congestive) heart failure: Secondary | ICD-10-CM | POA: Diagnosis not present

## 2019-02-23 DIAGNOSIS — Z95810 Presence of automatic (implantable) cardiac defibrillator: Secondary | ICD-10-CM | POA: Diagnosis not present

## 2019-02-23 DIAGNOSIS — Z7901 Long term (current) use of anticoagulants: Secondary | ICD-10-CM | POA: Diagnosis not present

## 2019-02-23 DIAGNOSIS — I110 Hypertensive heart disease with heart failure: Secondary | ICD-10-CM | POA: Insufficient documentation

## 2019-02-23 DIAGNOSIS — Z7982 Long term (current) use of aspirin: Secondary | ICD-10-CM | POA: Insufficient documentation

## 2019-02-23 DIAGNOSIS — I083 Combined rheumatic disorders of mitral, aortic and tricuspid valves: Secondary | ICD-10-CM | POA: Diagnosis not present

## 2019-02-23 DIAGNOSIS — T827XXA Infection and inflammatory reaction due to other cardiac and vascular devices, implants and grafts, initial encounter: Secondary | ICD-10-CM | POA: Insufficient documentation

## 2019-02-23 DIAGNOSIS — F17210 Nicotine dependence, cigarettes, uncomplicated: Secondary | ICD-10-CM | POA: Insufficient documentation

## 2019-02-23 DIAGNOSIS — I428 Other cardiomyopathies: Secondary | ICD-10-CM | POA: Diagnosis not present

## 2019-02-23 DIAGNOSIS — F1010 Alcohol abuse, uncomplicated: Secondary | ICD-10-CM | POA: Insufficient documentation

## 2019-02-23 DIAGNOSIS — I472 Ventricular tachycardia: Secondary | ICD-10-CM | POA: Diagnosis not present

## 2019-02-23 DIAGNOSIS — F329 Major depressive disorder, single episode, unspecified: Secondary | ICD-10-CM | POA: Diagnosis not present

## 2019-02-23 DIAGNOSIS — Z720 Tobacco use: Secondary | ICD-10-CM | POA: Diagnosis not present

## 2019-02-23 DIAGNOSIS — K219 Gastro-esophageal reflux disease without esophagitis: Secondary | ICD-10-CM | POA: Insufficient documentation

## 2019-02-23 LAB — CBC
HCT: 42 % (ref 39.0–52.0)
Hemoglobin: 14.4 g/dL (ref 13.0–17.0)
MCH: 32.9 pg (ref 26.0–34.0)
MCHC: 34.3 g/dL (ref 30.0–36.0)
MCV: 95.9 fL (ref 80.0–100.0)
Platelets: 160 10*3/uL (ref 150–400)
RBC: 4.38 MIL/uL (ref 4.22–5.81)
RDW: 15.9 % — ABNORMAL HIGH (ref 11.5–15.5)
WBC: 6.8 10*3/uL (ref 4.0–10.5)
nRBC: 0 % (ref 0.0–0.2)

## 2019-02-23 LAB — COMPREHENSIVE METABOLIC PANEL
ALT: 35 U/L (ref 0–44)
AST: 46 U/L — ABNORMAL HIGH (ref 15–41)
Albumin: 3.6 g/dL (ref 3.5–5.0)
Alkaline Phosphatase: 107 U/L (ref 38–126)
Anion gap: 10 (ref 5–15)
BUN: 10 mg/dL (ref 6–20)
CO2: 19 mmol/L — ABNORMAL LOW (ref 22–32)
Calcium: 9.1 mg/dL (ref 8.9–10.3)
Chloride: 110 mmol/L (ref 98–111)
Creatinine, Ser: 0.68 mg/dL (ref 0.61–1.24)
GFR calc Af Amer: >60 mL/min (ref >60)
GFR calc non Af Amer: >60 mL/min (ref >60)
Glucose, Bld: 75 mg/dL (ref 70–99)
Potassium: 4 mmol/L (ref 3.5–5.1)
Sodium: 139 mmol/L (ref 135–145)
Total Bilirubin: 0.5 mg/dL (ref 0.3–1.2)
Total Protein: 7.2 g/dL (ref 6.5–8.1)

## 2019-02-23 LAB — ECHOCARDIOGRAM COMPLETE
Height: 67 in
Weight: 2563.2 oz

## 2019-02-23 LAB — PROTIME-INR
INR: 2.1 — ABNORMAL HIGH (ref 0.8–1.2)
Prothrombin Time: 22.8 seconds — ABNORMAL HIGH (ref 11.4–15.2)

## 2019-02-23 LAB — LACTATE DEHYDROGENASE: LDH: 177 U/L (ref 98–192)

## 2019-02-23 MED ORDER — LOSARTAN POTASSIUM 50 MG PO TABS: 50.0000 mg | ORAL_TABLET | Freq: Two times a day (BID) | ORAL | 5 refills | Status: DC

## 2019-02-23 MED FILL — LOSARTAN POTASSIUM 50 MG TA: 50 | 30 days supply | Qty: 60 | Fill #0

## 2019-02-23 NOTE — Progress Notes (Signed)
  Echocardiogram 2D Echocardiogram has been performed.  Carl [facility] 02/23/2019, 10:55 AM

## 2019-02-23 NOTE — Progress Notes (Signed)
LVAD INR 

## 2019-02-23 NOTE — Progress Notes (Addendum)
Patient presents for his 2 week f/u clinic visit in [facility] today alone. He denies any issues with VAD drive line or VAD equipment.  He reports he has a few doses left of his Doxy, but says his drive line pain has completely resolved. Claris Che has been performing every other day dressing changes; will advance to weekly, see below.   He confirms he increased his Losartan dose to 100 mg daily (was instructed to take 50 mg twice daily), but has NOT TAKEN ANY OF HIS MEDICATIONS THIS AM. He also reports local pharmacy was "out" of Losartan, Rx sent to [facility] as requested.   Mulitple low voltage alarms noted in history, advised patient to sleep on MPU and change batteries as needed to prevent these alarms.   Patient had echo after visit, reviewed by Dr. Shirlee Latch - increased his VAD speed to 5800 with low speed limit set at 5500 RPMs.   Vital Signs:  Ht: 5'7" Temp: 97.8 Doppler Pressure 110 Automatc BP: 108/95 (101) HR:  79 SPO2: UTO  Weight: 160.2 lb w/o eqt Last weight: 160.4 lbs Home weights:   VAD Indication: Destination Therapy    VAD interrogation & Equipment Management: Speed: 5700 Flow: 4.9 Power: 4.5 w    PI: 3.9  Alarms:few low voltage advisories and LOW VOLTAGE ALARMS Events: 0-15  Fixed speed 5700 Low speed limit: 5400  Primary Controller:  Replace back up battery in 20 months. Back up controller:   Replace back up battery in 20 months.  Annual Equipment Maintenance on UBC/PM was performed on today, 02/23/19.   I reviewed the LVAD parameters from today and compared the results to the patient's prior recorded data. LVAD interrogation was NEGATIVE for significant power changes, NEGATIVE for clinical alarms and STABLE for PI events/speed drops. No programming changes were made and pump is functioning within specified parameters. Pt is performing daily controller and system monitor self tests along with completing weekly and monthly maintenance for  LVAD equipment.  LVAD equipment check completed and is in good working order. Back-up equipment present.  Exit Site Care: Drive line is being maintained every other day by Claris Che. Existing drive line dressing removed using sterile technique. Site cleansed with CHG swab x 2 and allowed to dry. Cleansed with saline wipe x 2 and allowed to dry. Topped site with gauze dressing and silver strip. Drive line incorporated. The velour is fully implanted at exit site. No draniage, redness, tenderness, rash, or odor present. Stabilization device re-applied. Pt denies fever or chills. Instructed to increase to weekly dressings and to call us if pain or drainage return. Provided with 14 daily 14 weekly kits.    Device:Medtronic Therapies: on 200 bpm Monitor: on slow VT 176; AT 171 Last check: 01/14/18  BP & Labs:  MAP 110 - Doppler is reflecting modified systolic  Hgb 14.4  - No S/S of bleeding. Specifically denies melena/BRBPR or nosebleeds.  LDH stable at 177 with established baseline of 200-300. Denies tea-colored urine. No power elevations noted on interrogation.   Annual maintenance completed per [employer] on patients universal Magazine features editor; batteries replaced in MPU.   Batteries Manufacture Date: Number of uses: Re-calibration  11/30/17 120 - 125 Batteries are due for re-calibration   Advised patient his batteries need re-calibration. Educated and demonstrated to patient how and why to perform. He verbalized understanding of same.   Backup system controller 11 volt battery charged during visit.  1 year Intermacs follow up completed including:  Quality of  Life, KCCQ-12, and Neurocognitive trail making.   Pt completed 1300 feet during 6 minute walk without any issues.    Patient Instructions: 1. Advance to weekly dressing changes. If drainage or pain re-occur, notify VAD coordinator and go back to daily dressing changes. 2. Rx for Losartan sent to [facility]. 3.  Increased VAD speed to 5800 RPMs per Dr. Shirlee Latch. 4. PharmD will call you with INR results and warfarin dosing. 5. Return to V[facility] Clinic in 2 mos.   Hessie Diener RN VAD Coordinator  Office: [phone]  24/7 Pager: [phone]     PCP: Julio Alm  HF MD:  Dr Shirlee Latch  Cardiology: Dr Tomie China.   HPI: Carl Ferguson is a 53 y.o. male with a history of chronic systolic HF previously on home milrinone, NICM s/p Medtronic ICD, chronic LBBB, tobacco abuse, ETOH abuse, substance abuse, hx of device plaments with extractions due to infection, and mitral regurgitation. S/P HMIII 01/27/2018.   He has long complicated history of CHF.  Care was initially in [place] and at [facility].  Previously had EF down to 30% in 2011 at initial diagnosis.  In 4/13, he got a single chamber ICD.  In 9/13, he had implantation of a subcutaneous array for high DFTs while on amiodarone for VT.  Echo in 11/15 with EF 30%.  In 11/15, single chamber ICD, leads, and subcutaneous array all removed due to infection.  Discharged with [facility]. In 1/16, he had subcutaneous ICD placed.  In 9/16, this was explanted due to erosion.  EF on echo in 9/16 was 45%.  Echo in 11/17 showed EF 50%.    Admitted to [facility] 2/28 - 06/17/17 with SOB, orthopnea, and PND. He was off medicines for about 6 weeks prior due to insurance issues. Repeat Echo (3/19) significant for fall in EF to 15-20% with severe LV dilation and severe diffuse hypokinesis, moderate-severe MR.   Followed up with Dr. Tomie China 06/24/17. Feeling better at this appointment. Referred for LHC and HF team. Dr Excell Seltzer performed [facility] March 2019. No significant CAD.  CPX 3/19 showed peak VO2 24 (68% predicted) with VE/VCO2 slope 37 with RER 0.91 (submaximal).  Probably mild to moderate functional impairment.   He was admitted for LVAD work up. Underwent HM3 implantation on 01/27/18. 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  He returns for followup of LVAD.  MAP is  elevated at 101 today but he has not taken his losartan yet. His driveline site look much better after doxycycline course.  He still gets short of breath with heavy exertion but was able to walk 396 m today with 6 minute walk.  Weight is stable.  No lightheadedness. He is still smoking about 5 cigarettes/day and drinks beer occasionally.   Today's echo was reviewed, LV EF remains 15-20% with septum bowed right, normal RV size with mild-moderate decreased systolic function, no AoV opening.   Denies LVAD alarms.  Denies driveline trauma, erythema or drainage.  Denies ICD shocks.   Reports taking Coumadin as prescribed and adherence to anticoagulation based dietary restrictions.  Denies bright red blood per rectum or melena, no dark urine or hematuria.    PMH: 1. Active smoker.  2. Chronic systolic CHF: Nonischemic cardiomyopathy, diagnosed in 2011. HIV negative.  History of drug/ETOH abuse, now stopped.  - Echo 2011: EF 30%.  - In 4/13, he got a single chamber ICD.  In 9/13, he had implantation of a subcutaneous array for high DFTs while on amiodarone  for VT.  - Echo 11/15: EF 30%.  - In 11/15, single chamber ICD, leads, and subcutaneous array all removed due to infection. - In 1/16, he had subcutaneous ICD placed.  In 9/16, this was explanted due to erosion. - Echo 9/16: EF 45% - Echo 11/17: EF 50% - Echo (3/19): EF 15-20% with severe LV dilation and severe diffuse hypokinesis, moderate-severe MR.  - LHC (3/19): No significant coronary disease.  - CPX (3/19): Peak VO2 24 (68% predicted) with VE/VCO2 slope 37 with RER 0.91 (submaximal).  Probably mild to moderate functional impairment.  - Echo (9/19):  EF is around 20% with severe dilation, severe central mitral regurgitation. Severe LAE. - RHC (9/19): mean RA 3, PA 41/20 mean 30, mean PCWP 16, CI 1.4, PVR 5.6 WU.  - TEE (9/19): Severe LV dilation > 7 cm, EF 15-20%, dyssynchrony noted, mildly decreased RV systolic function, severe functional MR.    - Medtronic ICD with His bundle pacing 9/19, unable to place LV lead.  - HM3 LVAD 10/19 3. LBBB 4. Mitral regurgitation: Severe on 9/19 echo. Suspect primarily functional.  5. Depression   Current Outpatient Medications  Medication Sig Dispense Refill   acetaminophen (TYLENOL) 500 MG tablet Take 500-1,500 mg by mouth every 6 (six) hours as needed for mild pain, moderate pain, fever or headache.      albuterol (VENTOLIN HFA) 108 (90 Base) MCG/ACT inhaler Inhale 2 puffs into the lungs every 6 (six) hours as needed for wheezing or shortness of breath. 8 g 6   aspirin EC 81 MG tablet Take 1 tablet (81 mg total) by mouth daily. 30 tablet 0   doxycycline (VIBRAMYCIN) 100 MG capsule Take 1 capsule (100 mg total) by mouth 2 (two) times daily for 14 days. 28 capsule 0   fluticasone (FLONASE) 50 MCG/ACT nasal spray Place 2 sprays into both nostrils daily.     gabapentin (NEURONTIN) 400 MG capsule Take 1 capsule (400 mg total) by mouth 2 (two) times daily. 60 capsule 0   omeprazole (PRILOSEC) 20 MG capsule Take 1 capsule (20 mg total) by mouth daily. 30 capsule 0   sertraline (ZOLOFT) 50 MG tablet Take 1 tablet (50 mg total) by mouth daily. 30 tablet 0   sildenafil (REVATIO) 20 MG tablet Take 1 tablet (20 mg total) by mouth 3 (three) times daily. 90 tablet 6   spironolactone (ALDACTONE) 25 MG tablet Take 1 tablet (25 mg total) by mouth daily. 30 tablet 2   warfarin (COUMADIN) 3 MG tablet Take 2 tablets (6 mg total) by mouth daily. (Patient taking differently: Take warfarin 6 mg (1 tab) MWF and 3 mg all other days) 60 tablet 0   losartan (COZAAR) 50 MG tablet Take 1 tablet (50 mg total) by mouth 2 (two) times daily. 60 tablet 5   No current facility-administered medications for this encounter.     Norflex [orphenadrine] and Sodium metabisulfite  REVIEW OF SYSTEMS: All systems negative except as listed in HPI, PMH and Problem list.   LVAD INTERROGATION:  See nurse's note above for  details.   I reviewed the LVAD parameters from today, and compared the results to the patient's prior recorded data.  No programming changes were made.  The LVAD is functioning within specified parameters.  The patient performs LVAD self-test daily.  LVAD interrogation was negative for any significant power changes, alarms or PI events/speed drops.  LVAD equipment check completed and is in good working order.  Back-up equipment present.   LVAD  education done on emergency procedures and precautions and reviewed exit site care.   MAP 101 Vitals:   02/23/19 0940 02/23/19 0941  BP: (!) 110/0 (!) 108/95  Pulse:  79  Temp:  97.8 F (36.6 C)  Weight:  72.7 kg (160 lb 3.2 oz)  Height:  5\' 7"  (1.702 m)   Wt Readings from Last 3 Encounters:  02/23/19 72.7 kg (160 lb 3.2 oz)  02/10/19 72.8 kg (160 lb 6.4 oz)  04/14/18 73.9 kg (163 lb)   Physical Exam: General: Well appearing this am. NAD.  HEENT: Normal. Neck: Supple, JVP 7-8 cm. Carotids OK.  Cardiac:  Mechanical heart sounds with LVAD hum present.  Lungs:  CTAB, normal effort.  Abdomen:  NT, ND, no HSM. No bruits or masses. +BS  LVAD exit site: Well-healed and incorporated. Dressing dry and intact. No erythema or drainage. Stabilization device present and accurately applied. Driveline dressing changed daily per sterile technique. Extremities:  Warm and dry. No cyanosis, clubbing, rash, or edema.  Neuro:  Alert & oriented x 3. Cranial nerves grossly intact. Moves all 4 extremities w/o difficulty. Affect pleasant    ASSESSMENT AND PLAN:   1. Chronic Systolic CHF: S/p HM3 40/98/11. Nonischemic cardiomyopathy, diagnosed in 2011, initially followed at [place]. LHC (3/19) with no significant CAD. CPX (3/19) submaximal, but suggestive of mild to moderate HF limitation. HIV negative 3/19, history of drug/ETOH abuse in the past, no drugs now and hadcut back ETOH considerably. Echo 9/19 showedEF 20% with severe LV dilation, severe MR. NYHA  class IV symptoms. He has a wide LBBB. RHC was done in 9/19 showing low output, CI 1.4. He was admitted to start milrinone, unable to wean off milrinone and remained on 0.25 mcg/kg/min at home. Medtronic ICD was placed, he has a His bundle lead but EP was unable to get an LV lead in place =>His bundle pacing does not narrow his QRS and did not help [place] wean him off milrinone. HM3 placed 01/27/18. Speed increased to 5700 02/05/18 with echo + RHC LVAD optimization. LVAD interrogation today is stable.  NYHA class II, he does not look volume overloaded on exam today.  MAP is elevated but he did not take losartan today.  On echo, septum bows towards the right though the AoV does not open.  - We increased his speed today to 5800 rpm.  - He can stay off diuretic for now.  - Continue losartan 100 mg daily, reminded him to take regularly.    - Continue spironolactone 25 mg daily.  -Continue warfarin with INR goal 2-2.5 and ASA 81.   - Continue sildenafil 20 mg TID, added for evidence of RV failure with PAPI 1.85 on post-op RHC. 2. ETOH Abuse:Drinking average of 2 beers a day.  I encouraged him to stop drinking alcohol totally.  3. Tobacco abuse: Smokes 5 cigarettes/day per his report.  Needs to quit completely.   4. Mitral regurgitation: Functional.  Decreased MR s/p LVAD placement.  5. VT:  No further VT.  Now off amiodarone.   6. Depression: Continue sertraline 50 mg daily.  7. Driveline infection: Resolved after course of doxycycline.   8. Cocaine abuse: I strongly advised him to stay away from this totally. He has quit for several months now per his report.   Loralie Champagne 02/23/2019

## 2019-03-06 ENCOUNTER — Other Ambulatory Visit (HOSPITAL_COMMUNITY): Payer: Self-pay | Admitting: *Deleted

## 2019-03-06 DIAGNOSIS — Z7901 Long term (current) use of anticoagulants: Secondary | ICD-10-CM

## 2019-03-06 DIAGNOSIS — Z95811 Presence of heart assist device: Secondary | ICD-10-CM

## 2019-03-09 ENCOUNTER — Ambulatory Visit (HOSPITAL_COMMUNITY): Payer: Self-pay | Admitting: Pharmacist

## 2019-03-09 ENCOUNTER — Other Ambulatory Visit (HOSPITAL_COMMUNITY)
Admission: RE | Admit: 2019-03-09 | Discharge: 2019-03-09 | Disposition: A | Discharge status: Home / Self Care | Payer: Medicaid Other | Source: Ambulatory Visit | Attending: Cardiology | Admitting: Cardiology

## 2019-03-09 DIAGNOSIS — Z95811 Presence of heart assist device: Secondary | ICD-10-CM

## 2019-03-09 LAB — PROTIME-INR
INR: 2.2 — ABNORMAL HIGH (ref 0.8–1.2)
Prothrombin Time: 24.6 seconds — ABNORMAL HIGH (ref 11.4–15.2)

## 2019-03-09 NOTE — Progress Notes (Signed)
LVAD INR 

## 2019-03-30 ENCOUNTER — Other Ambulatory Visit (HOSPITAL_COMMUNITY): Payer: Self-pay

## 2019-03-30 DIAGNOSIS — I5022 Chronic systolic (congestive) heart failure: Secondary | ICD-10-CM

## 2019-03-30 DIAGNOSIS — M792 Neuralgia and neuritis, unspecified: Secondary | ICD-10-CM

## 2019-03-30 DIAGNOSIS — I428 Other cardiomyopathies: Secondary | ICD-10-CM

## 2019-03-30 DIAGNOSIS — Z95811 Presence of heart assist device: Secondary | ICD-10-CM

## 2019-03-30 MED ORDER — GABAPENTIN 400 MG PO CAPS: 400.0000 mg | ORAL_CAPSULE | Freq: Two times a day (BID) | ORAL | 0 refills | Status: DC

## 2019-03-31 ENCOUNTER — Telehealth (HOSPITAL_COMMUNITY): Payer: Self-pay | Admitting: Pharmacist

## 2019-03-31 ENCOUNTER — Other Ambulatory Visit (HOSPITAL_COMMUNITY): Payer: Self-pay

## 2019-03-31 DIAGNOSIS — I5022 Chronic systolic (congestive) heart failure: Secondary | ICD-10-CM

## 2019-03-31 DIAGNOSIS — M792 Neuralgia and neuritis, unspecified: Secondary | ICD-10-CM

## 2019-03-31 DIAGNOSIS — Z95811 Presence of heart assist device: Secondary | ICD-10-CM

## 2019-03-31 DIAGNOSIS — I428 Other cardiomyopathies: Secondary | ICD-10-CM

## 2019-03-31 DIAGNOSIS — Z7901 Long term (current) use of anticoagulants: Secondary | ICD-10-CM

## 2019-03-31 DIAGNOSIS — I10 Essential (primary) hypertension: Secondary | ICD-10-CM

## 2019-03-31 MED ORDER — GABAPENTIN 400 MG PO CAPS: 400.0000 mg | ORAL_CAPSULE | Freq: Two times a day (BID) | ORAL | 0 refills | Status: DC

## 2019-03-31 MED ORDER — WARFARIN SODIUM 3 MG PO TABS: ORAL_TABLET | ORAL | 6 refills | Status: DC

## 2019-03-31 MED ORDER — LOSARTAN POTASSIUM 50 MG PO TABS: 50.0000 mg | ORAL_TABLET | Freq: Two times a day (BID) | ORAL | 6 refills | Status: DC

## 2019-03-31 MED ORDER — SPIRONOLACTONE 25 MG PO TABS: 25.0000 mg | ORAL_TABLET | Freq: Every day | ORAL | 6 refills | Status: AC

## 2019-03-31 MED ORDER — SILDENAFIL CITRATE 20 MG PO TABS: 20.0000 mg | ORAL_TABLET | Freq: Three times a day (TID) | ORAL | 6 refills | Status: DC

## 2019-03-31 MED ORDER — GABAPENTIN 400 MG PO CAPS: 400.0000 mg | ORAL_CAPSULE | Freq: Two times a day (BID) | ORAL | 6 refills | Status: AC

## 2019-03-31 MED ORDER — SERTRALINE HCL 50 MG PO TABS: 50.0000 mg | ORAL_TABLET | Freq: Every day | ORAL | 6 refills | Status: AC

## 2019-03-31 MED ORDER — OMEPRAZOLE 20 MG PO CPDR: 20.0000 mg | DELAYED_RELEASE_CAPSULE | Freq: Every day | ORAL | 6 refills | Status: AC

## 2019-03-31 NOTE — Telephone Encounter (Signed)
[facility].   Audry Riles, PharmD, BCPS, BCCP, CPP Heart Failure Clinic Pharmacist [phone]

## 2019-04-01 ENCOUNTER — Encounter (HOSPITAL_COMMUNITY)
Admission: RE | Admit: 2019-04-01 | Discharge: 2019-04-01 | Disposition: A | Discharge status: Home / Self Care | Payer: Medicaid Other | Source: Ambulatory Visit | Attending: Cardiology | Admitting: Cardiology

## 2019-04-01 DIAGNOSIS — Z95811 Presence of heart assist device: Secondary | ICD-10-CM | POA: Insufficient documentation

## 2019-04-01 DIAGNOSIS — Z7901 Long term (current) use of anticoagulants: Secondary | ICD-10-CM | POA: Diagnosis present

## 2019-04-01 LAB — PROTIME-INR
INR: 2.4 — ABNORMAL HIGH (ref 0.8–1.2)
Prothrombin Time: 25.9 seconds — ABNORMAL HIGH (ref 11.4–15.2)

## 2019-04-02 ENCOUNTER — Other Ambulatory Visit: Payer: Self-pay

## 2019-04-02 ENCOUNTER — Telehealth (HOSPITAL_COMMUNITY): Payer: Self-pay | Admitting: Licensed Clinical Social Worker

## 2019-04-02 ENCOUNTER — Ambulatory Visit (HOSPITAL_COMMUNITY): Payer: Self-pay | Admitting: Pharmacist

## 2019-04-02 ENCOUNTER — Emergency Department (HOSPITAL_COMMUNITY)
Admission: EM | Admit: 2019-04-02 | Discharge: 2019-04-02 | Disposition: A | Discharge status: Home / Self Care | Payer: Medicaid Other | Attending: Emergency Medicine | Admitting: Emergency Medicine

## 2019-04-02 DIAGNOSIS — I110 Hypertensive heart disease with heart failure: Secondary | ICD-10-CM | POA: Insufficient documentation

## 2019-04-02 DIAGNOSIS — Z87891 Personal history of nicotine dependence: Secondary | ICD-10-CM | POA: Insufficient documentation

## 2019-04-02 DIAGNOSIS — Z7901 Long term (current) use of anticoagulants: Secondary | ICD-10-CM | POA: Diagnosis not present

## 2019-04-02 DIAGNOSIS — Z95811 Presence of heart assist device: Secondary | ICD-10-CM | POA: Insufficient documentation

## 2019-04-02 DIAGNOSIS — Z7982 Long term (current) use of aspirin: Secondary | ICD-10-CM | POA: Insufficient documentation

## 2019-04-02 DIAGNOSIS — I5022 Chronic systolic (congestive) heart failure: Secondary | ICD-10-CM | POA: Diagnosis not present

## 2019-04-02 DIAGNOSIS — Z79899 Other long term (current) drug therapy: Secondary | ICD-10-CM | POA: Insufficient documentation

## 2019-04-02 LAB — CBC WITH DIFFERENTIAL/PLATELET
Abs Immature Granulocytes: 0.04 10*3/uL (ref 0.00–0.07)
Basophils Absolute: 0 10*3/uL (ref 0.0–0.1)
Basophils Relative: 0 %
Eosinophils Absolute: 0.1 10*3/uL (ref 0.0–0.5)
Eosinophils Relative: 1 %
HCT: 43.9 % (ref 39.0–52.0)
Hemoglobin: 15.9 g/dL (ref 13.0–17.0)
Immature Granulocytes: 0 %
Lymphocytes Relative: 15 %
Lymphs Abs: 1.8 10*3/uL (ref 0.7–4.0)
MCH: 34.6 pg — ABNORMAL HIGH (ref 26.0–34.0)
MCHC: 36.2 g/dL — ABNORMAL HIGH (ref 30.0–36.0)
MCV: 95.4 fL (ref 80.0–100.0)
Monocytes Absolute: 1.2 10*3/uL — ABNORMAL HIGH (ref 0.1–1.0)
Monocytes Relative: 10 %
Neutro Abs: 8.6 10*3/uL — ABNORMAL HIGH (ref 1.7–7.7)
Neutrophils Relative %: 74 %
Platelets: 260 10*3/uL (ref 150–400)
RBC: 4.6 MIL/uL (ref 4.22–5.81)
RDW: 15.2 % (ref 11.5–15.5)
WBC: 11.7 10*3/uL — ABNORMAL HIGH (ref 4.0–10.5)
nRBC: 0 % (ref 0.0–0.2)

## 2019-04-02 LAB — COMPREHENSIVE METABOLIC PANEL
ALT: 19 U/L (ref 0–44)
AST: 27 U/L (ref 15–41)
Albumin: 4 g/dL (ref 3.5–5.0)
Alkaline Phosphatase: 143 U/L — ABNORMAL HIGH (ref 38–126)
Anion gap: 11 (ref 5–15)
BUN: 12 mg/dL (ref 6–20)
CO2: 22 mmol/L (ref 22–32)
Calcium: 9.8 mg/dL (ref 8.9–10.3)
Chloride: 106 mmol/L (ref 98–111)
Creatinine, Ser: 0.84 mg/dL (ref 0.61–1.24)
GFR calc Af Amer: >60 mL/min (ref >60)
GFR calc non Af Amer: >60 mL/min (ref >60)
Glucose, Bld: 98 mg/dL (ref 70–99)
Potassium: 4.6 mmol/L (ref 3.5–5.1)
Sodium: 139 mmol/L (ref 135–145)
Total Bilirubin: 0.9 mg/dL (ref 0.3–1.2)
Total Protein: 8.5 g/dL — ABNORMAL HIGH (ref 6.5–8.1)

## 2019-04-02 LAB — LACTATE DEHYDROGENASE: LDH: 210 U/L — ABNORMAL HIGH (ref 98–192)

## 2019-04-02 MED ORDER — WARFARIN - PHARMACIST DOSING INPATIENT: Freq: Every day | Status: DC

## 2019-04-02 MED ORDER — LOSARTAN POTASSIUM 50 MG PO TABS
50.0000 mg | ORAL_TABLET | Freq: Two times a day (BID) | ORAL | Status: DC
  Administered 2019-04-02: 16:00:00 50 mg via ORAL
  Filled 2019-04-02: qty 1

## 2019-04-02 MED ORDER — SPIRONOLACTONE 25 MG PO TABS: 25.0000 mg | ORAL_TABLET | Freq: Every day | ORAL | Status: DC

## 2019-04-02 MED ORDER — SERTRALINE HCL 50 MG PO TABS: 50.0000 mg | ORAL_TABLET | Freq: Every day | ORAL | Status: DC

## 2019-04-02 MED ORDER — GABAPENTIN 400 MG PO CAPS
400.0000 mg | ORAL_CAPSULE | Freq: Two times a day (BID) | ORAL | Status: DC
  Administered 2019-04-02: 16:00:00 400 mg via ORAL
  Filled 2019-04-02: qty 1

## 2019-04-02 MED ORDER — SILDENAFIL CITRATE 20 MG PO TABS: 20.0000 mg | ORAL_TABLET | Freq: Three times a day (TID) | ORAL | Status: DC

## 2019-04-02 MED ORDER — PANTOPRAZOLE SODIUM 40 MG PO TBEC
40.0000 mg | DELAYED_RELEASE_TABLET | Freq: Every day | ORAL | Status: DC
  Administered 2019-04-02: 40 mg via ORAL
  Filled 2019-04-02: qty 1

## 2019-04-02 MED ORDER — WARFARIN SODIUM 3 MG PO TABS
3.0000 mg | ORAL_TABLET | Freq: Once | ORAL | Status: AC
  Administered 2019-04-02: 18:00:00 3 mg via ORAL
  Filled 2019-04-02: qty 1

## 2019-04-02 NOTE — Telephone Encounter (Signed)
CSW informed by VAD coordinators that pt has no where to go tonight.  CSW called [facility] and confirmed they are having a white flag night (because its so cold) so patient would be able to stay there overnight.  CSW requested taxi voucher from [facility] case manager for 6-6:30 this evening as doors open at 7pm at the [facility].   CSW met with pt and provided info for [facility] and suggested he speak with staff there to learn more about local shelter opportunities.  CSW will continue to follow and assist as needed  Jorge Ny, [facility] Desk#: [phone] Cell#: [phone]

## 2019-04-02 NOTE — ED Provider Notes (Signed)
Patient denies symptoms.  He has been cleared by LVAD team.  He was given a backup battery and a Tree surgeon.  He states his medications are at the pharmacy in [place] but he cannot pick them up due to having no money and no way to get there.  Case manager will speak with him. He was given a cab voucher to go to the [facility] tonight  Case manager was able to transfer patient's medications to pharmacy in [place].  They will be covered by Medicaid.  He understands to pick them up in the morning.  Patient has a stable place to go to tonight.  He has his medications and his LVAD batteries. Prescriptions available at the pharmacy tomorrow which he is aware of. Return precautions discussed.   BP 109/89 (BP Location: Right Arm)   Pulse 79   Temp 98.4 F (36.9 C)   Resp 16   Ht 5\' 7"  (1.702 m)   Wt 72.6 kg   SpO2 98%   BMI 25.06 kg/m     Ezequiel Essex, MD 04/02/19 2057

## 2019-04-02 NOTE — Progress Notes (Signed)
ANTICOAGULATION CONSULT NOTE - Initial Consult  Pharmacy Consult for warfarin  Indication: LVAD  Allergies  Allergen Reactions  . Norflex [Orphenadrine] Swelling    FACE [ANGIOEDEMA]  . Sodium Metabisulfite     Patient Measurements: Height: 5\' 7"  (170.2 cm) Weight: 160 lb (72.6 kg) IBW/kg (Calculated) : 66.1  Vital Signs: Temp: 98.4 F (36.9 C) (12/17 1418) BP: 124/105 (12/17 1431) Pulse Rate: 103 (12/17 1431)  Labs: Recent Labs    04/01/19 1324  LABPROT 25.9*  INR 2.4*    CrCl cannot be calculated (Patient's most recent lab result is older than the maximum 21 days allowed.).   Medical History: Past Medical History:  Diagnosis Date  . AICD (automatic cardioverter/defibrillator) present   . CHF (congestive heart failure) ([place])   . COPD (chronic obstructive pulmonary disease) ([place])   . Depression   . Dyspnea   . GERD (gastroesophageal reflux disease)   . Nonischemic cardiomyopathy ([place])    a. diagnosed in 2011 with EF 30% at that time b. EF improved to 50% by echo in 02/2016  . Pneumonia   . Tachycardia     Assessment: 53 year old male known to heart failure clinic for LVAD follow up and INR management on chronic warfarin.   INR was at goal yesterday when checked at [facility] apparently before being incarcerated and released. Will continue currently scheduled outpatient dose. If admitted will check INR in am and adjust as needed.   Goal of Therapy:  INR goal 2-2.5 Monitor platelets by anticoagulation protocol: Yes   Plan:  Continue warfarin 6mg  every MWF and 3mg  all other days - would discharge on this dose Patient checks at [facility] - next scheduled recheck is 12/31  Erin Hearing PharmD., BCPS Clinical Pharmacist 04/02/2019 3:15 PM

## 2019-04-02 NOTE — ED Notes (Signed)
Pt will be going to [facility] [employer]) tonight. Patient will be able to be discharged at [place] voucher at bedside. Care coordination consulted for patient medication arrangements.   [facility] nurse aware.

## 2019-04-02 NOTE — Discharge Instructions (Signed)
[facility] Followup with your heart doctor. Return to the ED if you develop new or worsening symptoms.

## 2019-04-02 NOTE — Progress Notes (Signed)
Consult request has been received. CSW attempting to follow up at present time.  CSW received a call from pt's RN stating pt is D/C'ing to the white-flag shelter at the [facility], but was requesting assistance with getting meds as the pt has meds waiting for him, "at a pharmacy in [place]", per the pt's RN, but cannot access them nor find a way to [place] to retrieve them.  TOC ED RN CM has been contacted and will be involved.  CSW will continue to follow for D/C needs.  Alphonse Guild. Jalynn Waddell, LCSW, LCAS, CSI Transitions of Care Clinical Social Worker Care Coordination Department Ph: [phone]

## 2019-04-02 NOTE — Discharge Planning (Signed)
EDCM provided taxi voucher for transportation to shelter.
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
## 2019-04-02 NOTE — Progress Notes (Signed)
LVAD INR 

## 2019-04-02 NOTE — ED Notes (Signed)
Tried to contact social work but was sent to voice mail I will try again in 19mins
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
## 2019-04-02 NOTE — Progress Notes (Addendum)
TOC CM spoke to pt and states he just got out of jail today. States [facility] [place]. [facility] [place] as he is going to [facility] to shelter. He has Medicaid, pt can request copay waiver for his meds at pharmacy. Pt states he will use public transportation to get to pharmacy. Pt states he has a PCP in [place]. Jonnie Finner RN CCM, [facility] ED TOC Jearld Lesch [phone]  04/02/2019 7:54 pm [facility] [address]. Address added to pt's AVS. Will follow up with pt on tomorrow. Jonnie Finner RN CCM, [facility] ED TOC CM [phone]  04/03/2019 3:05 pm Attempted call to pt to follow up on meds. Left message for return call. [place], [facility] ED TOC CM [phone]

## 2019-04-14 ENCOUNTER — Telehealth (HOSPITAL_COMMUNITY): Payer: Self-pay | Admitting: *Deleted

## 2019-04-14 NOTE — Telephone Encounter (Signed)
Message left on VAD office VM yesterday from patient stating he is currently an IP at [facility]; call back # [phone] and ask for Planning Room. Pt says he needs dressing supplies, medications, and help with getting to next VAD Clinic appt scheduled 04/27/19.  Called # above and ask for Planning Room. Spoke with Luna Glasgow was unable to confirm if Mr. Kohlmann is or is not a patient at their facility. Explained patient called and left message yesterday asking for dressing kits, medications, and help with f/u appt in VAD Clinic here at [facility] hospital.  Also explained pt has LVAD and the need for daily warfarin and the reason why it is so important.  Our address, office, fax, and emergency VAD pager contact information given to [facility]. Also told him we could help with any VAD education that his facility might need.  He will get message to patient and determine what needs to be done. (Pt not allowed to have his personal cell phone in this facility). Edison Nasuti voices understanding of how important these issues are and will have patient call us with specific needs. He also voiced understanding the facility may reach [place] emergently 24\7 using VAD pager: [phone].  Zada Girt RN, [place] Coordinator [phone]

## 2019-04-24 ENCOUNTER — Other Ambulatory Visit (HOSPITAL_COMMUNITY): Payer: Self-pay | Admitting: *Deleted

## 2019-04-24 DIAGNOSIS — Z95811 Presence of heart assist device: Secondary | ICD-10-CM

## 2019-04-24 DIAGNOSIS — Z7901 Long term (current) use of anticoagulants: Secondary | ICD-10-CM

## 2019-04-27 ENCOUNTER — Encounter (HOSPITAL_COMMUNITY): Payer: Medicaid Other

## 2019-04-27 ENCOUNTER — Telehealth (HOSPITAL_COMMUNITY): Payer: Self-pay | Admitting: *Deleted

## 2019-04-27 NOTE — Telephone Encounter (Signed)
Called patient left message re: missed LVAD clinic appt today. Unable to send letter, pt currently has no home address. (Pt no longer lives with Claris Che who lives at address on file). Again, asked pt to call VAD office with any needs and to re-schedule clinic appt.   Hessie Diener RN, VAD Coordinator [phone]

## 2019-05-14 ENCOUNTER — Telehealth (HOSPITAL_COMMUNITY): Payer: Self-pay | Admitting: *Deleted

## 2019-05-14 DIAGNOSIS — Z7901 Long term (current) use of anticoagulants: Secondary | ICD-10-CM

## 2019-05-14 DIAGNOSIS — Z95811 Presence of heart assist device: Secondary | ICD-10-CM
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
## 2019-05-18 ENCOUNTER — Telehealth (HOSPITAL_COMMUNITY): Payer: Self-pay | Admitting: Pharmacist

## 2019-05-18 ENCOUNTER — Ambulatory Visit (HOSPITAL_COMMUNITY)
Admission: RE | Admit: 2019-05-18 | Discharge: 2019-05-18 | Disposition: A | Discharge status: Home / Self Care | Payer: Medicaid Other | Source: Ambulatory Visit | Attending: Internal Medicine | Admitting: Internal Medicine

## 2019-05-18 ENCOUNTER — Encounter (HOSPITAL_COMMUNITY): Payer: Self-pay

## 2019-05-18 ENCOUNTER — Ambulatory Visit (HOSPITAL_COMMUNITY): Payer: Self-pay | Admitting: Pharmacist

## 2019-05-18 ENCOUNTER — Other Ambulatory Visit: Payer: Self-pay

## 2019-05-18 VITALS — BP 118/78 | HR 115 | Temp 98.0°F | Ht 67.0 in | Wt 160.0 lb

## 2019-05-18 DIAGNOSIS — F329 Major depressive disorder, single episode, unspecified: Secondary | ICD-10-CM | POA: Insufficient documentation

## 2019-05-18 DIAGNOSIS — I447 Left bundle-branch block, unspecified: Secondary | ICD-10-CM | POA: Insufficient documentation

## 2019-05-18 DIAGNOSIS — I340 Nonrheumatic mitral (valve) insufficiency: Secondary | ICD-10-CM | POA: Insufficient documentation

## 2019-05-18 DIAGNOSIS — I10 Essential (primary) hypertension: Secondary | ICD-10-CM

## 2019-05-18 DIAGNOSIS — I428 Other cardiomyopathies: Secondary | ICD-10-CM

## 2019-05-18 DIAGNOSIS — F1010 Alcohol abuse, uncomplicated: Secondary | ICD-10-CM | POA: Insufficient documentation

## 2019-05-18 DIAGNOSIS — Z7901 Long term (current) use of anticoagulants: Secondary | ICD-10-CM | POA: Diagnosis not present

## 2019-05-18 DIAGNOSIS — Z95810 Presence of automatic (implantable) cardiac defibrillator: Secondary | ICD-10-CM | POA: Diagnosis not present

## 2019-05-18 DIAGNOSIS — F17210 Nicotine dependence, cigarettes, uncomplicated: Secondary | ICD-10-CM | POA: Insufficient documentation

## 2019-05-18 DIAGNOSIS — I5043 Acute on chronic combined systolic (congestive) and diastolic (congestive) heart failure: Secondary | ICD-10-CM | POA: Diagnosis not present

## 2019-05-18 DIAGNOSIS — Z95811 Presence of heart assist device: Secondary | ICD-10-CM

## 2019-05-18 DIAGNOSIS — J302 Other seasonal allergic rhinitis: Secondary | ICD-10-CM

## 2019-05-18 DIAGNOSIS — I5022 Chronic systolic (congestive) heart failure: Secondary | ICD-10-CM

## 2019-05-18 DIAGNOSIS — R21 Rash and other nonspecific skin eruption: Secondary | ICD-10-CM | POA: Diagnosis not present

## 2019-05-18 LAB — CBC
HCT: 39.8 % (ref 39.0–52.0)
Hemoglobin: 13.4 g/dL (ref 13.0–17.0)
MCH: 32.4 pg (ref 26.0–34.0)
MCHC: 33.7 g/dL (ref 30.0–36.0)
MCV: 96.4 fL (ref 80.0–100.0)
Platelets: 238 10*3/uL (ref 150–400)
RBC: 4.13 MIL/uL — ABNORMAL LOW (ref 4.22–5.81)
RDW: 14.5 % (ref 11.5–15.5)
WBC: 9.6 10*3/uL (ref 4.0–10.5)
nRBC: 0 % (ref 0.0–0.2)

## 2019-05-18 LAB — COMPREHENSIVE METABOLIC PANEL
ALT: 16 U/L (ref 0–44)
AST: 21 U/L (ref 15–41)
Albumin: 3.7 g/dL (ref 3.5–5.0)
Alkaline Phosphatase: 131 U/L — ABNORMAL HIGH (ref 38–126)
Anion gap: 12 (ref 5–15)
BUN: 8 mg/dL (ref 6–20)
CO2: 21 mmol/L — ABNORMAL LOW (ref 22–32)
Calcium: 9.3 mg/dL (ref 8.9–10.3)
Chloride: 108 mmol/L (ref 98–111)
Creatinine, Ser: 1.01 mg/dL (ref 0.61–1.24)
GFR calc Af Amer: >60 mL/min (ref >60)
GFR calc non Af Amer: >60 mL/min (ref >60)
Glucose, Bld: 91 mg/dL (ref 70–99)
Potassium: 4.2 mmol/L (ref 3.5–5.1)
Sodium: 141 mmol/L (ref 135–145)
Total Bilirubin: 0.7 mg/dL (ref 0.3–1.2)
Total Protein: 7.5 g/dL (ref 6.5–8.1)

## 2019-05-18 LAB — PROTIME-INR
INR: 3.5 — ABNORMAL HIGH (ref 0.8–1.2)
Prothrombin Time: 34.8 seconds — ABNORMAL HIGH (ref 11.4–15.2)

## 2019-05-18 LAB — MAGNESIUM: Magnesium: 2.1 mg/dL (ref 1.7–2.4)

## 2019-05-18 LAB — LACTATE DEHYDROGENASE: LDH: 205 U/L — ABNORMAL HIGH (ref 98–192)

## 2019-05-18 MED ORDER — LOSARTAN POTASSIUM 50 MG PO TABS: 50.0000 mg | ORAL_TABLET | Freq: Two times a day (BID) | ORAL | 11 refills | Status: AC

## 2019-05-18 MED ORDER — FLUTICASONE PROPIONATE 50 MCG/ACT NA SUSP: 2.0000 | Freq: Every day | NASAL | 3 refills | Status: AC

## 2019-05-18 MED ORDER — SILDENAFIL CITRATE 20 MG PO TABS: 20.0000 mg | ORAL_TABLET | Freq: Three times a day (TID) | ORAL | 11 refills | Status: AC

## 2019-05-18 MED ORDER — FUROSEMIDE 40 MG PO TABS: 40.0000 mg | ORAL_TABLET | Freq: Every day | ORAL | 3 refills | Status: AC

## 2019-05-18 MED ORDER — AMLODIPINE BESYLATE 2.5 MG PO TABS: 2.5000 mg | ORAL_TABLET | Freq: Every day | ORAL | 3 refills | Status: AC

## 2019-05-18 NOTE — Progress Notes (Addendum)
Patient presents for his @ 3 mo f/u clinic visit in [facility] today alone. He denies any issues with VAD drive line or equipment.  Pt has missed last few scheduled appts due to being in [place] without transportation. He says he was "kicked out" of rehab center for ETOH due to having his VAD. He has not been getting INR checks, but does report he has been on medications. He brought meds with him and all were reviewed. He has been taking warfarin 3 mg daily except 6 mg on MWF as last instructed. Pt requested refills on Losartan, sildenafil, and flonase - sent to new pharmacy, [facility] on [address].  He does report some pain right beside DL exit site when "bending over". He has been performing his own dressing changes and "ran out of dressing kits" a while ago. He has been using OTC supplies and cleansing with alcohol swabs. Site care performed per Emerson Monte, RN, VAD Coordinator - see below.  Pt tachycardic today with wide QRS complex; EKG obtained and reviewed by Dr. Aundra Dubin.   Pt has raised skin rash on back and torso, he says he has history of "skin issues" but "some of these" are different than in the past. He is staying at a new motel and reports they have roaches, but unsure if bed bugs are a possibility. He says he hasn't seen any, but he "hasn't looked". Dr. Aundra Dubin assessed.    Pt returned [place] today, says he retrieved his UBC and 8 batteries from [place]. He had been sleeping on batteries until this time. Joycelyn Schmid brought him to clinic visit today, but he is not living with her. He is living in [place] and has no phone. He wants all calls sent to his mother in [place]. He says he "calls her every day" and will retrieve messages. Audry Riles, PharmD updated with contact info.  Vital Signs:  Ht: 5'7" Temp: 98.0 Doppler Pressure 100 Automatc BP: 118/78 (104) HR:  113 - 115 at rest; elevated 130s with activity SPO2: UTO  Weight: 160 lb w/o eqt Last weight: 160.2 lbs Home weights: does  not perform   VAD Indication: Destination Therapy    VAD interrogation & Equipment Management: Speed: 5800 Flow: 5.2 Power: 4.7w    PI: 3.1  Alarms:multiple low voltage advisories and LOW VOLTAGE ALARMS Events: rare  Fixed speed 5800 Low speed limit: 5500  Primary Controller:  Replace back up battery in 17 months. Back up controller:   Replace back up battery in 17 months.  Annual Equipment Maintenance on UBC/PM was performed on today, 02/23/19.   I reviewed the LVAD parameters from today and compared the results to the patient's prior recorded data. LVAD interrogation was NEGATIVE for significant power changes, NEGATIVE for clinical alarms and STABLE for PI events/speed drops. No programming changes were made and pump is functioning within specified parameters. Pt is performing daily controller and system monitor self tests along with completing weekly and monthly maintenance for LVAD equipment.  LVAD equipment check completed and is in good working order. Back-up equipment present.  Exit Site Care: Drive line is being maintained by patient weekly. He ran out of dressing kits and has been using OTC supplies cleansing with alcohol swabs. Existing drive line dressing removed using sterile technique. Site cleansed with CHG swab x 2 and allowed to dry. Cleansed with saline wipe x 2 and allowed to dry. Drive line incorporated. The velour is fully implanted at exit site. No draniage, redness, rash, or  odor present. Pt c/o tenderness along DL site when bending over.  Stabilization device re-applied. Pt denies fever or chills. Supplied with 14 weekly dressing kits for home use.     Device:Medtronic Therapies: on 200 bpm Monitor: on slow VT 176; AT 171 Last check: 01/14/18  BP & Labs:  MAP 100 - Doppler is reflecting modified systolic  Hgb 13.4 - No S/S of bleeding. Specifically denies melena/BRBPR or nosebleeds.  LDH pending - established baseline of 200-300. Denies  tea-colored urine. No power elevations noted on interrogation.    Patient Instructions: 1. Start amlodipine 2.5 mg daily. 2. Start Lasix 40 mg daily. 3. Return to VAD clinic in one month. 4. PharmD will call your mother with INR results and wafarin dosing.  Hessie Diener RN VAD Coordinator  Office: [phone]  24/7 Pager: [phone]    PCP: Julio Alm  HF MD:  Dr Shirlee Latch  Cardiology: Dr Tomie China.   HPI: Carl Ferguson is a 54 y.o. male with a history of chronic systolic HF previously on home milrinone, NICM s/p Medtronic ICD, chronic LBBB, tobacco abuse, ETOH abuse, substance abuse, hx of device plaments with extractions due to infection, and mitral regurgitation. S/P HMIII 01/27/2018.   He has long complicated history of CHF.  Care was initially in [place] and at [facility].  Previously had EF down to 30% in 2011 at initial diagnosis.  In 4/13, he got a single chamber ICD.  In 9/13, he had implantation of a subcutaneous array for high DFTs while on amiodarone for VT.  Echo in 11/15 with EF 30%.  In 11/15, single chamber ICD, leads, and subcutaneous array all removed due to infection.  Discharged with [facility]. In 1/16, he had subcutaneous ICD placed.  In 9/16, this was explanted due to erosion.  EF on echo in 9/16 was 45%.  Echo in 11/17 showed EF 50%.    Admitted to [facility] 2/28 - 06/17/17 with SOB, orthopnea, and PND. He was off medicines for about 6 weeks prior due to insurance issues. Repeat Echo (3/19) significant for fall in EF to 15-20% with severe LV dilation and severe diffuse hypokinesis, moderate-severe MR.   Followed up with Dr. Tomie China 06/24/17. Feeling better at this appointment. Referred for LHC and HF team. Dr Excell Seltzer performed [facility] March 2019. No significant CAD.  CPX 3/19 showed peak VO2 24 (68% predicted) with VE/VCO2 slope 37 with RER 0.91 (submaximal).  Probably mild to moderate functional impairment.   He was admitted for LVAD work up. Underwent HM3 implantation on  01/27/18. 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    11/20 echo showed that LV EF remains 15-20% with septum bowed right, normal RV size with mild-moderate decreased systolic function, no AoV opening.  Speed increased to 5800 rpm.   Patient returns for followup of LVAD.  Symptomatically stable, short of breath walking up a flight of stairs but generally ok on flat ground.  Not lightheaded.  MAP elevated at 100 today, says he is taking all his meds. He has had a papular rash on his trunk for a couple of weeks now.   Denies LVAD alarms.  Denies driveline trauma, erythema or drainage.  Denies ICD shocks.   Reports taking Coumadin as prescribed and adherence to anticoagulation based dietary restrictions.  Denies bright red blood per rectum or melena, no dark urine or hematuria.    ECG (personally reviewed): sinus tachycardia 108 with His bundle pacing.   PMH: 1. Active smoker.  2. Chronic systolic  CHF: Nonischemic cardiomyopathy, diagnosed in 2011. HIV negative.  History of drug/ETOH abuse, now stopped.  - Echo 2011: EF 30%.  - In 4/13, he got a single chamber ICD.  In 9/13, he had implantation of a subcutaneous array for high DFTs while on amiodarone for VT.  - Echo 11/15: EF 30%.  - In 11/15, single chamber ICD, leads, and subcutaneous array all removed due to infection. - In 1/16, he had subcutaneous ICD placed.  In 9/16, this was explanted due to erosion. - Echo 9/16: EF 45% - Echo 11/17: EF 50% - Echo (3/19): EF 15-20% with severe LV dilation and severe diffuse hypokinesis, moderate-severe MR.  - LHC (3/19): No significant coronary disease.  - CPX (3/19): Peak VO2 24 (68% predicted) with VE/VCO2 slope 37 with RER 0.91 (submaximal).  Probably mild to moderate functional impairment.  - Echo (9/19):  EF is around 20% with severe dilation, severe central mitral regurgitation. Severe LAE. - RHC (9/19): mean RA 3, PA 41/20 mean 30, mean PCWP 16, CI 1.4, PVR 5.6 WU.  - TEE (9/19):  Severe LV dilation > 7 cm, EF 15-20%, dyssynchrony noted, mildly decreased RV systolic function, severe functional MR.  - Medtronic ICD with His bundle pacing 9/19, unable to place LV lead.  - HM3 LVAD 10/19 3. LBBB 4. Mitral regurgitation: Severe on 9/19 echo. Suspect primarily functional.  5. Depression   Current Outpatient Medications  Medication Sig Dispense Refill  . acetaminophen (TYLENOL) 500 MG tablet Take 500-1,500 mg by mouth every 6 (six) hours as needed for mild pain, moderate pain, fever or headache.     . albuterol (VENTOLIN HFA) 108 (90 Base) MCG/ACT inhaler Inhale 2 puffs into the lungs every 6 (six) hours as needed for wheezing or shortness of breath. 8 g 6  . aspirin EC 81 MG tablet Take 1 tablet (81 mg total) by mouth daily. 30 tablet 0  . fluticasone (FLONASE) 50 MCG/ACT nasal spray Place 2 sprays into both nostrils daily. 16 g 3  . gabapentin (NEURONTIN) 400 MG capsule Take 1 capsule (400 mg total) by mouth 2 (two) times daily. 60 capsule 6  . losartan (COZAAR) 50 MG tablet Take 1 tablet (50 mg total) by mouth 2 (two) times daily. 60 tablet 11  . omeprazole (PRILOSEC) 20 MG capsule Take 1 capsule (20 mg total) by mouth daily. 30 capsule 6  . sertraline (ZOLOFT) 50 MG tablet Take 1 tablet (50 mg total) by mouth daily. 30 tablet 6  . sildenafil (REVATIO) 20 MG tablet Take 1 tablet (20 mg total) by mouth 3 (three) times daily. 90 tablet 11  . spironolactone (ALDACTONE) 25 MG tablet Take 1 tablet (25 mg total) by mouth daily. 30 tablet 6  . warfarin (COUMADIN) 3 MG tablet Take warfarin 6 mg (1 tab) MWF and 3 mg all other days 60 tablet 6  . amLODipine (NORVASC) 2.5 MG tablet Take 1 tablet (2.5 mg total) by mouth daily. 90 tablet 3  . furosemide (LASIX) 40 MG tablet Take 1 tablet (40 mg total) by mouth daily. 90 tablet 3   No current facility-administered medications for this encounter.    Norflex [orphenadrine] and Sodium metabisulfite  REVIEW OF SYSTEMS: All systems  negative except as listed in HPI, PMH and Problem list.   LVAD INTERROGATION:  See nurse's note above for details.   I reviewed the LVAD parameters from today, and compared the results to the patient's prior recorded data.  No programming changes were made.  The LVAD is functioning within specified parameters.  The patient performs LVAD self-test daily.  LVAD interrogation was negative for any significant power changes, alarms or PI events/speed drops.  LVAD equipment check completed and is in good working order.  Back-up equipment present.   LVAD education done on emergency procedures and precautions and reviewed exit site care.   MAP 101 Vitals:   05/18/19 1040 05/18/19 1041  BP: (!) 100/0 118/78  Pulse:  (!) 115  Temp:  98 F (36.7 C)  Weight:  72.6 kg (160 lb)  Height:  5\' 7"  (1.702 m)   Wt Readings from Last 3 Encounters:  05/18/19 72.6 kg (160 lb)  04/02/19 72.6 kg (160 lb)  02/23/19 72.7 kg (160 lb 3.2 oz)   Physical Exam: General: Well appearing this am. NAD.  HEENT: Normal. Neck: Supple, JVP 10-11 cm. Carotids OK.  Cardiac:  Mechanical heart sounds with LVAD hum present.  Lungs:  CTAB, normal effort.  Abdomen:  NT, ND, no HSM. No bruits or masses. +BS  LVAD exit site: Well-healed and incorporated. Dressing dry and intact. No erythema or drainage. Stabilization device present and accurately applied. Driveline dressing changed daily per sterile technique. Extremities:  Warm and dry. No cyanosis, clubbing, rash, or edema.  Neuro:  Alert & oriented x 3. Cranial nerves grossly intact. Moves all 4 extremities w/o difficulty. Affect pleasant   Skin: Papular rash on trunk.   ASSESSMENT AND PLAN:   1. Chronic Systolic CHF: S/p HM3 01/27/18. Nonischemic cardiomyopathy, diagnosed in 2011, initially followed at [facility]. LHC (3/19) with no significant CAD. CPX (3/19) submaximal, but suggestive of mild to moderate HF limitation. HIV negative 3/19, history of drug/ETOH abuse in  the past, no drugs now and hadcut back ETOH considerably. Echo 9/19 showedEF 20% with severe LV dilation, severe MR. NYHA class IV symptoms. He has a wide LBBB. RHC was done in 9/19 showing low output, CI 1.4. He was admitted to start milrinone, unable to wean off milrinone and remained on 0.25 mcg/kg/min at home. Medtronic ICD was placed, he has a His bundle lead but EP was unable to get an LV lead in place =>His bundle pacing does not narrow his QRS and did not help 10/19 wean him off milrinone. HM3 placed 01/27/18. Speed increased to 5700 02/05/18 with echo + RHC LVAD optimization. Speed increased to 5800 rpm in 11/20.  LVAD interrogation today is stable.  NYHA class II, mild volume overload on exam.  MAP is elevated.  - Start Lasix 40 mg daily, BMET 10 days.  - Continue losartan 100 mg daily.     - Continue spironolactone 25 mg daily.  - He has not tolerated Entresto in the past, will add amlodipine 2.5 mg daily.  -Continue warfarin with INR goal 2-2.5 and ASA 81.   - Continue sildenafil 20 mg TID, added for evidence of RV failure with PAPI 1.85 on post-op RHC. 2. ETOH Abuse: I encouraged him to stop drinking alcohol totally.  3. Tobacco abuse: Still some smoking.  Needs to quit completely.   4. Mitral regurgitation: Functional.  Decreased MR s/p LVAD placement.  5. VT:  No further VT.  Now off amiodarone.   6. Depression: Continue sertraline 50 mg daily.   7. H/o cocaine abuse: I strongly advised him to stay away from this totally. He has quit for several months now per his report.  8. Rash: Papular rash.  Discussed with ID PA, possible pityriasis rosea.   12/20 05/18/2019

## 2019-05-18 NOTE — Telephone Encounter (Signed)
Patient Advocate Encounter   Received notification from [facility] Medicaid that prior authorization for Sildenafil is required.   PA submitted on  Tracks Confirmation #: [id number] W Recipient ID: [id number] L Status is pending   Will continue to follow.  Karle Plumber, PharmD, BCPS, BCCP, CPP Heart Failure Clinic Pharmacist [phone]

## 2019-05-18 NOTE — Progress Notes (Signed)
LVAD INR 

## 2019-05-18 NOTE — Patient Instructions (Signed)
1. Start amlodipine 2.5 mg daily. 2. Start Lasix 40 mg daily. 3. Return to VAD clinic in one month. 4. PharmD will call your mother with INR results and wafarin dosing.

## 2019-05-22 NOTE — Telephone Encounter (Signed)
Advanced Heart Failure Patient Advocate Encounter  Prior Authorization for Sildenafil has been approved.    PA# [id number] Effective dates: 05/18/2019 - 05/17/2020  Patients co-pay is $3.00  Karle Plumber, PharmD, BCPS, BCCP, CPP Heart Failure Clinic Pharmacist [phone]

## 2019-05-29 ENCOUNTER — Other Ambulatory Visit (HOSPITAL_COMMUNITY): Payer: Self-pay | Admitting: Unknown Physician Specialty

## 2019-05-29 DIAGNOSIS — Z7901 Long term (current) use of anticoagulants: Secondary | ICD-10-CM

## 2019-05-29 DIAGNOSIS — Z95811 Presence of heart assist device: Secondary | ICD-10-CM

## 2019-06-01 ENCOUNTER — Other Ambulatory Visit (HOSPITAL_COMMUNITY): Payer: Medicaid Other

## 2019-06-06 ENCOUNTER — Emergency Department (HOSPITAL_COMMUNITY): Payer: Medicaid Other

## 2019-06-06 ENCOUNTER — Emergency Department (HOSPITAL_COMMUNITY)
Admission: EM | Admit: 2019-06-06 | Discharge: 2019-06-07 | Disposition: A | Discharge status: Home / Self Care | Payer: Medicaid Other | Attending: Emergency Medicine | Admitting: Emergency Medicine

## 2019-06-06 ENCOUNTER — Encounter (HOSPITAL_COMMUNITY): Payer: Self-pay

## 2019-06-06 ENCOUNTER — Other Ambulatory Visit: Payer: Self-pay

## 2019-06-06 DIAGNOSIS — M79645 Pain in left finger(s): Secondary | ICD-10-CM | POA: Diagnosis not present

## 2019-06-06 DIAGNOSIS — Y929 Unspecified place or not applicable: Secondary | ICD-10-CM | POA: Insufficient documentation

## 2019-06-06 DIAGNOSIS — Z87891 Personal history of nicotine dependence: Secondary | ICD-10-CM | POA: Diagnosis not present

## 2019-06-06 DIAGNOSIS — M25512 Pain in left shoulder: Secondary | ICD-10-CM | POA: Diagnosis not present

## 2019-06-06 DIAGNOSIS — I509 Heart failure, unspecified: Secondary | ICD-10-CM | POA: Insufficient documentation

## 2019-06-06 DIAGNOSIS — W134XXA Fall from, out of or through window, initial encounter: Secondary | ICD-10-CM | POA: Insufficient documentation

## 2019-06-06 DIAGNOSIS — Z95811 Presence of heart assist device: Secondary | ICD-10-CM | POA: Insufficient documentation

## 2019-06-06 DIAGNOSIS — Y9389 Activity, other specified: Secondary | ICD-10-CM | POA: Insufficient documentation

## 2019-06-06 DIAGNOSIS — Y999 Unspecified external cause status: Secondary | ICD-10-CM | POA: Insufficient documentation

## 2019-06-06 DIAGNOSIS — Z7901 Long term (current) use of anticoagulants: Secondary | ICD-10-CM | POA: Insufficient documentation

## 2019-06-06 DIAGNOSIS — J449 Chronic obstructive pulmonary disease, unspecified: Secondary | ICD-10-CM | POA: Insufficient documentation

## 2019-06-06 DIAGNOSIS — W1789XA Other fall from one level to another, initial encounter: Secondary | ICD-10-CM

## 2019-06-06 LAB — COMPREHENSIVE METABOLIC PANEL
ALT: 15 U/L (ref 0–44)
AST: 42 U/L — ABNORMAL HIGH (ref 15–41)
Albumin: 3.5 g/dL (ref 3.5–5.0)
Alkaline Phosphatase: 154 U/L — ABNORMAL HIGH (ref 38–126)
Anion gap: 16 — ABNORMAL HIGH (ref 5–15)
BUN: 14 mg/dL (ref 6–20)
CO2: 16 mmol/L — ABNORMAL LOW (ref 22–32)
Calcium: 8.5 mg/dL — ABNORMAL LOW (ref 8.9–10.3)
Chloride: 95 mmol/L — ABNORMAL LOW (ref 98–111)
Creatinine, Ser: 1.03 mg/dL (ref 0.61–1.24)
GFR calc Af Amer: >60 mL/min (ref >60)
GFR calc non Af Amer: >60 mL/min (ref >60)
Glucose, Bld: 111 mg/dL — ABNORMAL HIGH (ref 70–99)
Potassium: 3.9 mmol/L (ref 3.5–5.1)
Sodium: 127 mmol/L — ABNORMAL LOW (ref 135–145)
Total Bilirubin: 1 mg/dL (ref 0.3–1.2)
Total Protein: 7.4 g/dL (ref 6.5–8.1)

## 2019-06-06 LAB — CBC
HCT: 38.6 % — ABNORMAL LOW (ref 39.0–52.0)
Hemoglobin: 13.6 g/dL (ref 13.0–17.0)
MCH: 32.4 pg (ref 26.0–34.0)
MCHC: 35.2 g/dL (ref 30.0–36.0)
MCV: 91.9 fL (ref 80.0–100.0)
Platelets: 214 10*3/uL (ref 150–400)
RBC: 4.2 MIL/uL — ABNORMAL LOW (ref 4.22–5.81)
RDW: 14.3 % (ref 11.5–15.5)
WBC: 10.6 10*3/uL — ABNORMAL HIGH (ref 4.0–10.5)
nRBC: 0 % (ref 0.0–0.2)

## 2019-06-06 MED ORDER — IOHEXOL 300 MG/ML  SOLN
100.0000 mL | Freq: Once | INTRAMUSCULAR | Status: AC | PRN
  Administered 2019-06-06: 100 mL via INTRAVENOUS

## 2019-06-06 NOTE — ED Provider Notes (Signed)
11:36 PM Assumed care from Dr. Pilar Plate, please see their note for full history, physical and decision making until this point. In brief this is a 54 y.o. year old male who presented to the ED tonight with Left shoulder injury and Injury     LVAD patient here with trauma.  Any CT scans but likely discharge.  Sodium slightly low bicarb slightly low but most likely related to a long night of drinking.  LVAD team states that the LVAD is working appropriately they do not need to be involved unless he is admitted.  Discharge instructions, including strict return precautions for new or worsening symptoms, given. Patient and/or family verbalized understanding and agreement with the plan as described.   Labs, studies and imaging reviewed by myself and considered in medical decision making if ordered. Imaging interpreted by radiology.  Labs Reviewed  CBC - Abnormal; Notable for the following components:      Result Value   WBC 10.6 (*)    RBC 4.20 (*)    HCT 38.6 (*)    All other components within normal limits  COMPREHENSIVE METABOLIC PANEL - Abnormal; Notable for the following components:   Sodium 127 (*)    Chloride 95 (*)    CO2 16 (*)    Glucose, Bld 111 (*)    Calcium 8.5 (*)    AST 42 (*)    Alkaline Phosphatase 154 (*)    Anion gap 16 (*)    All other components within normal limits  PROTIME-INR    DG Hand Complete Left  Final Result    DG Shoulder Left  Final Result    DG Chest Port 1 View  Final Result    CT HEAD WO CONTRAST    (Results Pending)  CT CERVICAL SPINE WO CONTRAST    (Results Pending)  CT CHEST W CONTRAST    (Results Pending)  CT ABDOMEN PELVIS W CONTRAST    (Results Pending)    No follow-ups on file.    Jyll Tomaro, Barbara Cower, MD 06/08/19 [phone]

## 2019-06-06 NOTE — ED Notes (Signed)
LVAD evaluated by rapid response RN- Pump flow 5.8 lpm; Pump speed 5800 rpm; pulse index 1.8; pump flow 4.6 m

## 2019-06-06 NOTE — ED Notes (Signed)
LVAD Coordinator paged, Kirt Boys advised to call back if pt is to be admitted.
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
## 2019-06-06 NOTE — ED Notes (Signed)
MAP 80 (doppler) @2129 

## 2019-06-07 LAB — PROTIME-INR
INR: 9.8 (ref 0.8–1.2)
Prothrombin Time: 78.8 seconds — ABNORMAL HIGH (ref 11.4–15.2)

## 2019-06-07 MED ORDER — OXYCODONE-ACETAMINOPHEN 5-325 MG PO TABS
2.0000 | ORAL_TABLET | Freq: Once | ORAL | Status: AC
  Administered 2019-06-07: 2 via ORAL
  Filled 2019-06-07: qty 2

## 2019-06-07 MED ORDER — OXYCODONE-ACETAMINOPHEN 5-325 MG PO TABS: 2.0000 | ORAL_TABLET | Freq: Four times a day (QID) | ORAL | 0 refills | Status: AC | PRN

## 2019-06-07 NOTE — ED Notes (Signed)
Patient verbalizes understanding of discharge instructions. Opportunity for questioning and answers were provided. Armband removed by staff, pt discharged from ED.  

## 2019-06-08 ENCOUNTER — Ambulatory Visit (HOSPITAL_COMMUNITY): Payer: Self-pay | Admitting: Pharmacist

## 2019-06-08 ENCOUNTER — Other Ambulatory Visit: Payer: Self-pay

## 2019-06-08 ENCOUNTER — Encounter (HOSPITAL_COMMUNITY): Payer: Self-pay | Admitting: Emergency Medicine

## 2019-06-08 ENCOUNTER — Emergency Department (HOSPITAL_COMMUNITY)
Admission: EM | Admit: 2019-06-08 | Discharge: 2019-06-08 | Disposition: A | Discharge status: Home / Self Care | Payer: Medicaid Other | Attending: Emergency Medicine | Admitting: Emergency Medicine

## 2019-06-08 ENCOUNTER — Ambulatory Visit (HOSPITAL_COMMUNITY)
Admission: RE | Admit: 2019-06-08 | Discharge: 2019-06-08 | Disposition: A | Discharge status: Home / Self Care | Payer: Medicaid Other | Source: Ambulatory Visit | Attending: Cardiology | Admitting: Cardiology

## 2019-06-08 DIAGNOSIS — Z87891 Personal history of nicotine dependence: Secondary | ICD-10-CM | POA: Insufficient documentation

## 2019-06-08 DIAGNOSIS — J449 Chronic obstructive pulmonary disease, unspecified: Secondary | ICD-10-CM | POA: Diagnosis not present

## 2019-06-08 DIAGNOSIS — Z7901 Long term (current) use of anticoagulants: Secondary | ICD-10-CM | POA: Diagnosis not present

## 2019-06-08 DIAGNOSIS — Z95811 Presence of heart assist device: Secondary | ICD-10-CM | POA: Insufficient documentation

## 2019-06-08 DIAGNOSIS — Z45010 Encounter for checking and testing of cardiac pacemaker pulse generator [battery]: Secondary | ICD-10-CM | POA: Diagnosis present

## 2019-06-08 LAB — PROTIME-INR
INR: 7.4 (ref 0.8–1.2)
Prothrombin Time: 63.5 seconds — ABNORMAL HIGH (ref 11.4–15.2)

## 2019-06-08 MED ORDER — OXYCODONE-ACETAMINOPHEN 5-325 MG PO TABS
1.0000 | ORAL_TABLET | Freq: Once | ORAL | Status: AC
  Administered 2019-06-08: 1 via ORAL
  Filled 2019-06-08: qty 1
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
## 2019-06-08 NOTE — ED Notes (Signed)
Rapid response bedside w/ cart.  Batteries were changed and are now charging.

## 2019-06-08 NOTE — Addendum Note (Signed)
Encounter addended by: Marcy Siren, LCSW on: 06/08/2019 12:38 PM  Actions taken: Clinical Note Signed
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
## 2019-06-08 NOTE — Progress Notes (Signed)
LVAD INR 
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
## 2019-06-08 NOTE — ED Triage Notes (Signed)
Pt to ED stating that he needs his LVAD batteries charged.  Pt has 2 batteries with him

## 2019-06-09 ENCOUNTER — Telehealth (HOSPITAL_COMMUNITY): Payer: Self-pay | Admitting: *Deleted

## 2019-06-09 ENCOUNTER — Telehealth (HOSPITAL_COMMUNITY): Payer: Self-pay | Admitting: Licensed Clinical Social Worker

## 2019-06-09 NOTE — Telephone Encounter (Signed)
Patient called this morning to say that he was able to complete the initial intake at [facility] yesterday and has a virtual follow up for the second part. He reports that he is back in [place] with [place]. Patient will reach out to CSW if needed. Lasandra Beech, LCSW, CCSW-MCS [phone]

## 2019-06-09 NOTE — Telephone Encounter (Signed)
Pt called VAD office requesting a Fish farm manager.   He says he is in [place] at [employer] and is down to 3 bars on his last batteries. He reports his UBC is in a man's room at [employer] and the man is working.  Loaner [phone] provided for patient along with 2 extra fully charged batteries.   Hessie Diener RN, VAD Coordinator [phone]

## 2019-06-12 ENCOUNTER — Ambulatory Visit (HOSPITAL_COMMUNITY): Payer: Self-pay | Admitting: Pharmacist

## 2019-06-12 ENCOUNTER — Other Ambulatory Visit: Payer: Self-pay

## 2019-06-12 ENCOUNTER — Ambulatory Visit (HOSPITAL_COMMUNITY)
Admission: RE | Admit: 2019-06-12 | Discharge: 2019-06-12 | Disposition: A | Discharge status: Home / Self Care | Payer: Medicaid Other | Source: Ambulatory Visit | Attending: Cardiology | Admitting: Cardiology

## 2019-06-12 ENCOUNTER — Other Ambulatory Visit (HOSPITAL_COMMUNITY): Payer: Self-pay | Admitting: *Deleted

## 2019-06-12 DIAGNOSIS — Z5181 Encounter for therapeutic drug level monitoring: Secondary | ICD-10-CM | POA: Diagnosis not present

## 2019-06-12 DIAGNOSIS — Z7901 Long term (current) use of anticoagulants: Secondary | ICD-10-CM | POA: Insufficient documentation

## 2019-06-12 DIAGNOSIS — Z95811 Presence of heart assist device: Secondary | ICD-10-CM

## 2019-06-12 LAB — PROTIME-INR
INR: 1.9 — ABNORMAL HIGH (ref 0.8–1.2)
Prothrombin Time: 21.8 seconds — ABNORMAL HIGH (ref 11.4–15.2)

## 2019-06-12 MED ORDER — WARFARIN SODIUM 3 MG PO TABS: ORAL_TABLET | ORAL | 6 refills | Status: DC

## 2019-06-12 NOTE — Progress Notes (Signed)
LVAD INR 

## 2019-06-16 ENCOUNTER — Other Ambulatory Visit (HOSPITAL_COMMUNITY): Payer: Self-pay | Admitting: Unknown Physician Specialty

## 2019-06-16 DIAGNOSIS — Z7901 Long term (current) use of anticoagulants: Secondary | ICD-10-CM

## 2019-06-16 DIAGNOSIS — Z95811 Presence of heart assist device: Secondary | ICD-10-CM

## 2019-06-17 ENCOUNTER — Other Ambulatory Visit (HOSPITAL_COMMUNITY): Payer: Self-pay | Admitting: *Deleted

## 2019-06-17 ENCOUNTER — Ambulatory Visit (HOSPITAL_COMMUNITY): Payer: Self-pay | Admitting: Pharmacist

## 2019-06-17 ENCOUNTER — Other Ambulatory Visit (HOSPITAL_COMMUNITY)
Admission: RE | Admit: 2019-06-17 | Discharge: 2019-06-17 | Disposition: A | Discharge status: Home / Self Care | Payer: Medicaid Other | Source: Ambulatory Visit | Attending: Cardiology | Admitting: Cardiology

## 2019-06-17 ENCOUNTER — Other Ambulatory Visit: Payer: Self-pay

## 2019-06-17 DIAGNOSIS — Z95811 Presence of heart assist device: Secondary | ICD-10-CM | POA: Diagnosis not present

## 2019-06-17 DIAGNOSIS — Z7901 Long term (current) use of anticoagulants: Secondary | ICD-10-CM | POA: Insufficient documentation

## 2019-06-17 LAB — PROTIME-INR
INR: 4.3 (ref 0.8–1.2)
Prothrombin Time: 41.5 seconds — ABNORMAL HIGH (ref 11.4–15.2)

## 2019-06-17 MED ORDER — WARFARIN SODIUM 3 MG PO TABS: ORAL_TABLET | ORAL | 6 refills | Status: DC

## 2019-06-17 NOTE — Progress Notes (Signed)
LVAD INR 

## 2019-06-18 ENCOUNTER — Other Ambulatory Visit (HOSPITAL_COMMUNITY): Payer: Self-pay | Admitting: Unknown Physician Specialty

## 2019-06-18 DIAGNOSIS — Z7901 Long term (current) use of anticoagulants: Secondary | ICD-10-CM

## 2019-06-18 DIAGNOSIS — Z95811 Presence of heart assist device: Secondary | ICD-10-CM

## 2019-06-22 ENCOUNTER — Encounter (HOSPITAL_COMMUNITY): Payer: Medicaid Other

## 2019-06-22 ENCOUNTER — Encounter (HOSPITAL_COMMUNITY): Payer: Self-pay | Admitting: Unknown Physician Specialty

## 2019-06-24 ENCOUNTER — Telehealth (HOSPITAL_COMMUNITY): Payer: Self-pay | Admitting: Pharmacist

## 2019-06-24 NOTE — Telephone Encounter (Signed)
Called patient to schedule INR follow-up as he missed his appointment on Monday. Claris Che informed me that he has moved back to [place] and will no longer be living in [place].  Karle Plumber, PharmD, BCPS, BCCP, CPP Heart Failure Clinic Pharmacist [phone]

## 2019-06-26 ENCOUNTER — Telehealth (HOSPITAL_COMMUNITY): Payer: Self-pay | Admitting: *Deleted

## 2019-06-26 ENCOUNTER — Telehealth (HOSPITAL_COMMUNITY): Payer: Self-pay | Admitting: Licensed Clinical Social Worker

## 2019-06-26 NOTE — Telephone Encounter (Signed)
Patient called stating he has permanently relocated to [place] with his mother. He reports this was a "spur of the moment" decision, and he "hopped on a train" and left. He is requesting orders be sent to [facility] for INR checks and to re-establish VAD care with [facility] and Lung.   States that he needs dressing kits, as he only has 2 left. He has enough of his medications, but will call the pharmacy in [place] to ship him refills as needed while he is working on obtaining [employer]. States he has all of his VAD equipment (including our Fish farm manager) except his MPU that he left behind. Instructed him to contact Claris Che to see if she can ship him MPU or bring to VAD clinic so we can ship to him.   Phone number and address updated in Epic:   [address] [place], [place] [address] Phone: [phone]  Standing orders for INR sent to [facility] in [place]. Fax #: [phone]. Updated medical records faxed to [facility] and Lung. Fax #: [phone].   [name] RN VAD Coordinator  Office: [phone]  24/7 Pager: [phone]
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
## 2019-06-29 ENCOUNTER — Ambulatory Visit (HOSPITAL_COMMUNITY): Payer: Self-pay | Admitting: Pharmacist

## 2019-06-29 DIAGNOSIS — Z95811 Presence of heart assist device: Secondary | ICD-10-CM

## 2019-06-29 LAB — PROTIME-INR
INR: 1.6 — ABNORMAL HIGH (ref 0.9–1.2)
Prothrombin Time: 16.5 s — ABNORMAL HIGH (ref 9.1–12.0)

## 2019-06-29 LAB — POCT INR: INR: 1.6 — AB (ref 2.0–3.0)

## 2019-06-29 NOTE — Progress Notes (Signed)
LVAD INR 

## 2019-07-06 ENCOUNTER — Encounter (HOSPITAL_COMMUNITY): Payer: Medicaid Other

## 2019-07-07 LAB — PROTIME-INR
INR: 2.3 — ABNORMAL HIGH (ref 0.9–1.2)
Prothrombin Time: 23 s — ABNORMAL HIGH (ref 9.1–12.0)

## 2019-07-08 ENCOUNTER — Ambulatory Visit (HOSPITAL_COMMUNITY): Payer: Self-pay | Admitting: Pharmacist

## 2019-07-08 DIAGNOSIS — Z95811 Presence of heart assist device: Secondary | ICD-10-CM

## 2019-07-08 LAB — POCT INR: INR: 2.3 (ref 2.0–3.0)

## 2019-07-08 NOTE — Progress Notes (Signed)
LVAD INR 

## 2019-07-14 ENCOUNTER — Ambulatory Visit (HOSPITAL_COMMUNITY): Payer: Self-pay | Admitting: Pharmacist

## 2019-07-14 DIAGNOSIS — Z7901 Long term (current) use of anticoagulants: Secondary | ICD-10-CM

## 2019-07-14 DIAGNOSIS — Z95811 Presence of heart assist device: Secondary | ICD-10-CM

## 2019-07-14 LAB — PROTIME-INR
INR: 4 — ABNORMAL HIGH (ref 0.9–1.2)
Prothrombin Time: 40 s — ABNORMAL HIGH (ref 9.1–12.0)

## 2019-07-14 LAB — POCT INR: INR: 4 — AB (ref 2.0–3.0)

## 2019-07-14 MED ORDER — WARFARIN SODIUM 3 MG PO TABS: ORAL_TABLET | ORAL | 6 refills | Status: AC

## 2019-07-14 NOTE — Progress Notes (Signed)
LVAD INR 

## 2019-07-20 ENCOUNTER — Encounter (HOSPITAL_COMMUNITY): Payer: Medicaid Other

## 2019-07-22 ENCOUNTER — Telehealth (HOSPITAL_COMMUNITY): Payer: Self-pay | Admitting: Pharmacist

## 2019-07-22 NOTE — Telephone Encounter (Signed)
Spoke to Carl Ferguson today. He informed me that his [address] has run out and he has established care in [place]. He has his first appointment with them tomorrow and they will take over managing his INR.   Karle Plumber, PharmD, BCPS, BCCP, CPP Heart Failure Clinic Pharmacist [phone]

## 2019-08-12 MED ORDER — GENERIC EXTERNAL MEDICATION
12.50 | Status: DC
Start: ? — End: 2019-08-12

## 2019-08-12 MED ORDER — GENERIC EXTERNAL MEDICATION
Status: DC
Start: ? — End: 2019-08-12

## 2019-08-12 MED ORDER — SENNOSIDES 8.6 MG PO TABS
8.60 | ORAL_TABLET | ORAL | Status: DC
Start: ? — End: 2019-08-12

## 2019-08-12 MED ORDER — SODIUM CHLORIDE (PF) 0.9 % IJ SOLN
0.50 | INTRAMUSCULAR | Status: DC
Start: ? — End: 2019-08-12

## 2019-08-12 MED ORDER — GENERIC EXTERNAL MEDICATION
1.00 | Status: DC
Start: ? — End: 2019-08-12

## 2019-08-12 MED ORDER — LANSOPRAZOLE 15 MG PO TBDD
15.00 | DELAYED_RELEASE_TABLET | ORAL | Status: DC
Start: 2019-08-17 — End: 2019-08-12

## 2019-08-12 MED ORDER — SODIUM CHLORIDE (PF) 0.9 % IJ SOLN
10.00 | INTRAMUSCULAR | Status: DC
Start: ? — End: 2019-08-12

## 2019-08-12 MED ORDER — PROPOFOL 1000 MG/100ML IV EMUL
5.00 | INTRAVENOUS | Status: DC
Start: ? — End: 2019-08-12

## 2019-08-12 MED ORDER — ACETAMINOPHEN CHILDRENS 160 MG/5ML PO SOLN
650.00 | ORAL | Status: DC
Start: ? — End: 2019-08-12

## 2019-08-12 MED ORDER — METRONIDAZOLE IN NACL 5-0.79 MG/ML-% IV SOLN
500.00 | INTRAVENOUS | Status: DC
Start: 2019-08-12 — End: 2019-08-12

## 2019-08-12 MED ORDER — POLYETHYLENE GLYCOL 3350 17 GM/SCOOP PO POWD
17.00 | ORAL | Status: DC
Start: ? — End: 2019-08-12

## 2019-08-12 MED ORDER — IOHEXOL 350 MG/ML SOLN
100.00 | INTRAVENOUS | Status: DC
Start: ? — End: 2019-08-12

## 2019-08-12 MED ORDER — GENERIC EXTERNAL MEDICATION
2.00 | Status: DC
Start: 2019-08-16 — End: 2019-08-12

## 2019-08-12 MED ORDER — CHLORHEXIDINE GLUCONATE 4 % EX LIQD
1.00 | CUTANEOUS | Status: DC
Start: 2019-08-17 — End: 2019-08-12

## 2019-08-12 MED ORDER — LEVETIRACETAM IN NACL 500 MG/100ML IV SOLN
500.00 | INTRAVENOUS | Status: DC
Start: 2019-08-16 — End: 2019-08-12

## 2019-08-12 MED ORDER — PROPOFOL 100 MG/10ML IV EMUL
1.00 | INTRAVENOUS | Status: DC
Start: ? — End: 2019-08-12

## 2019-08-12 MED ORDER — INSULIN ASPART 100 UNIT/ML FLEXPEN
0.00 | PEN_INJECTOR | SUBCUTANEOUS | Status: DC
Start: 2019-08-16 — End: 2019-08-12

## 2019-08-12 MED ORDER — WH PETROL-MINERAL OIL-LANOLIN 0.1-0.1 % OP OINT
1.00 | TOPICAL_OINTMENT | OPHTHALMIC | Status: DC
Start: ? — End: 2019-08-12

## 2019-08-16 ENCOUNTER — Telehealth: Payer: Self-pay | Admitting: Student

## 2019-08-16 MED ORDER — GENERIC EXTERNAL MEDICATION
75.00 | Status: DC
Start: 2019-08-16 — End: 2019-08-16

## 2019-08-16 MED ORDER — NICARDIPINE HCL IN DEXTROSE 40-5 MG/200ML-% IV SOLN
2.50 | INTRAVENOUS | Status: DC
Start: ? — End: 2019-08-16

## 2019-08-16 MED ORDER — AMIODARONE HCL IN DEXTROSE 360-4.14 MG/200ML-% IV SOLN
1.00 | INTRAVENOUS | Status: DC
Start: ? — End: 2019-08-16

## 2019-08-16 MED ORDER — GENERIC EXTERNAL MEDICATION
Status: DC
Start: ? — End: 2019-08-16

## 2019-08-16 MED ORDER — ACETAMINOPHEN CHILDRENS 160 MG/5ML PO SOLN
1000.00 | ORAL | Status: DC
Start: ? — End: 2019-08-16

## 2019-08-16 NOTE — Telephone Encounter (Signed)
    The patient's friend Claris Che) called to report the patient has been living in [place] since 06/2019. 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 Expressed condolences from our team. She wanted Dr. Shirlee Latch and the LVAD team to be aware and I will route this message to them.   Signed, Ellsworth Lennox, PA-C 08/16/2019, 2:42 PM

## 2019-09-15 DEATH — deceased

## 2019-10-16 IMAGING — DX DG CHEST 2V
2 series · 2 of 2 positions shown · non-contrast
Comparison: 01/06/2018 chest radiograph

CLINICAL DATA: 51 y/o  M; sepsis.

EXAM:
CHEST - 2 VIEW

[chest pa]
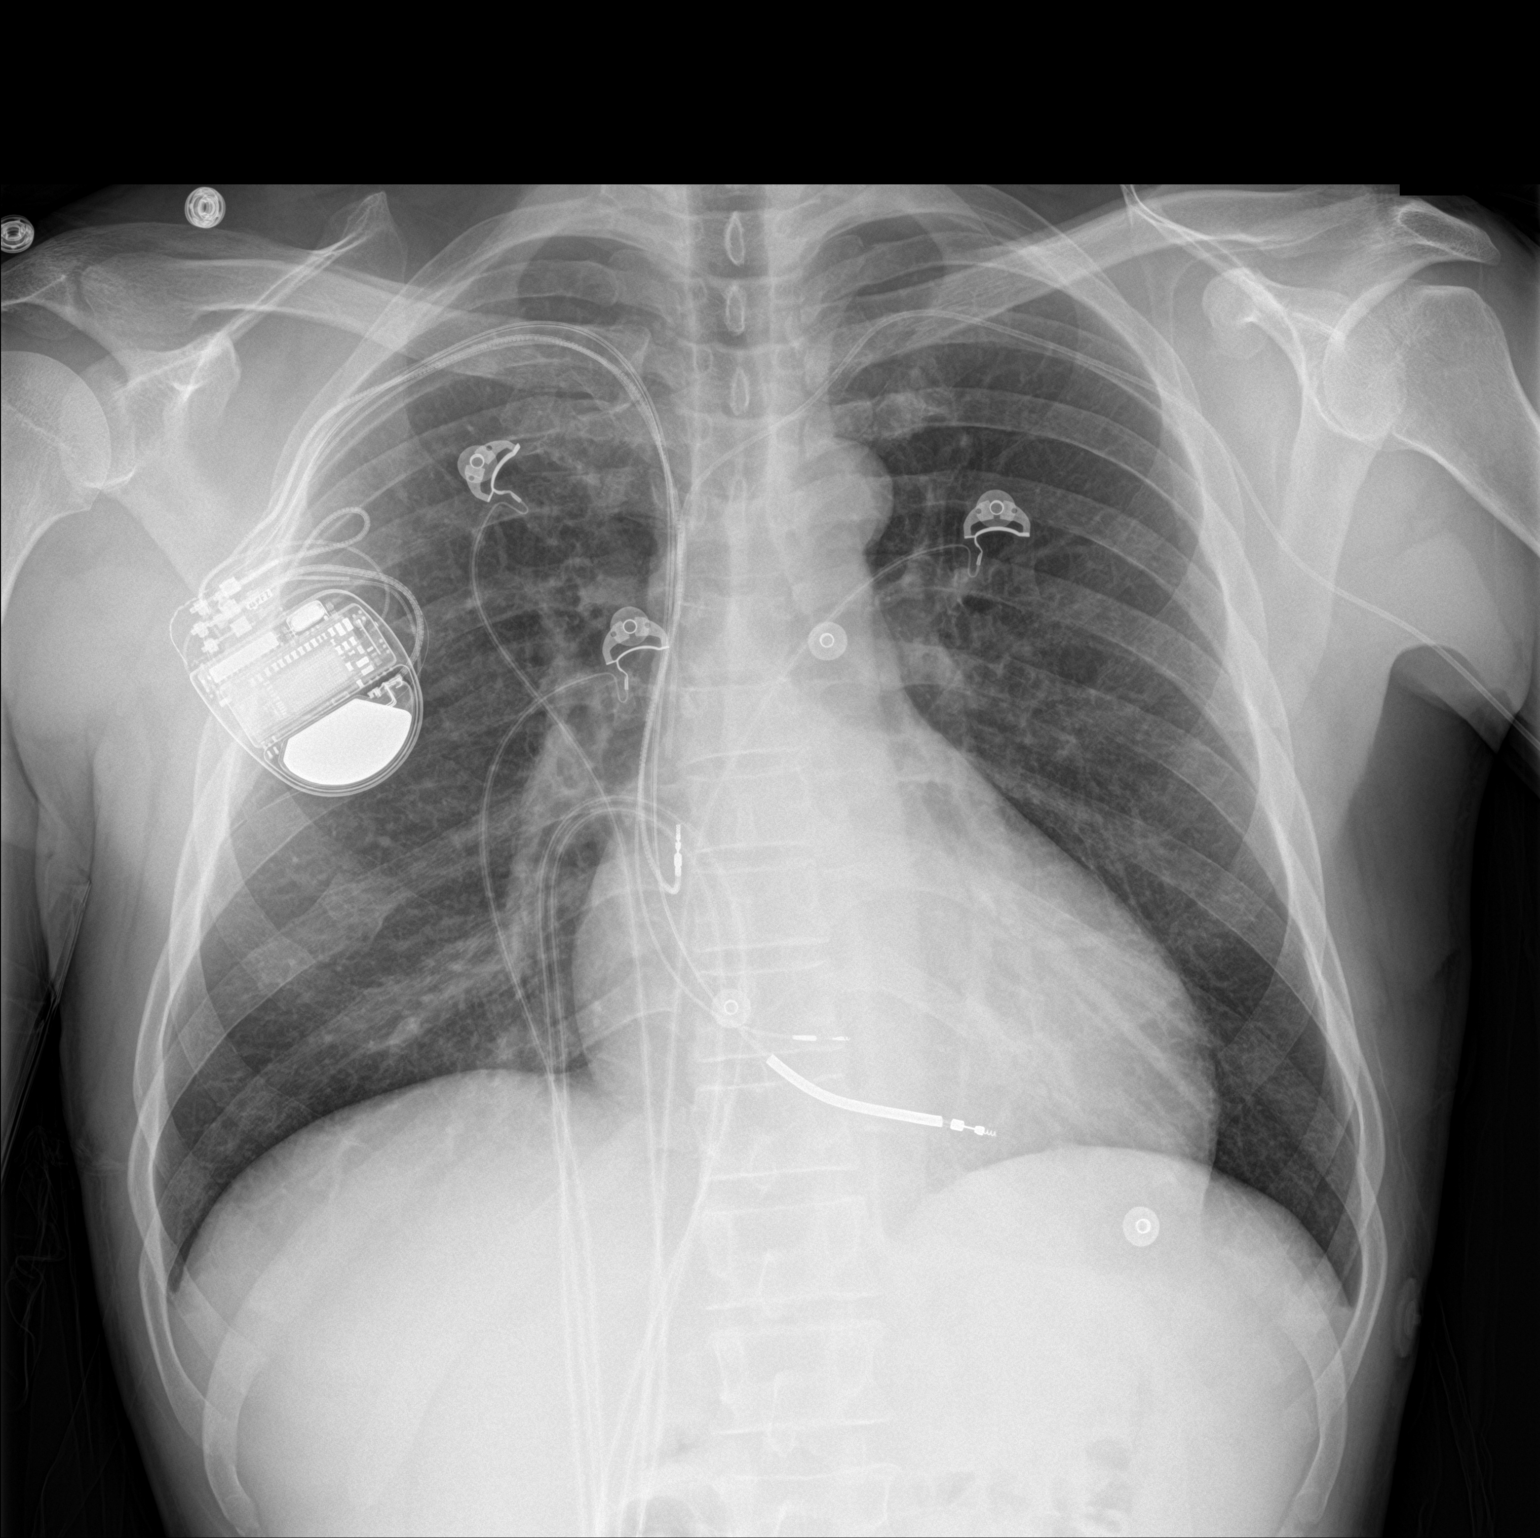

[chest lat]
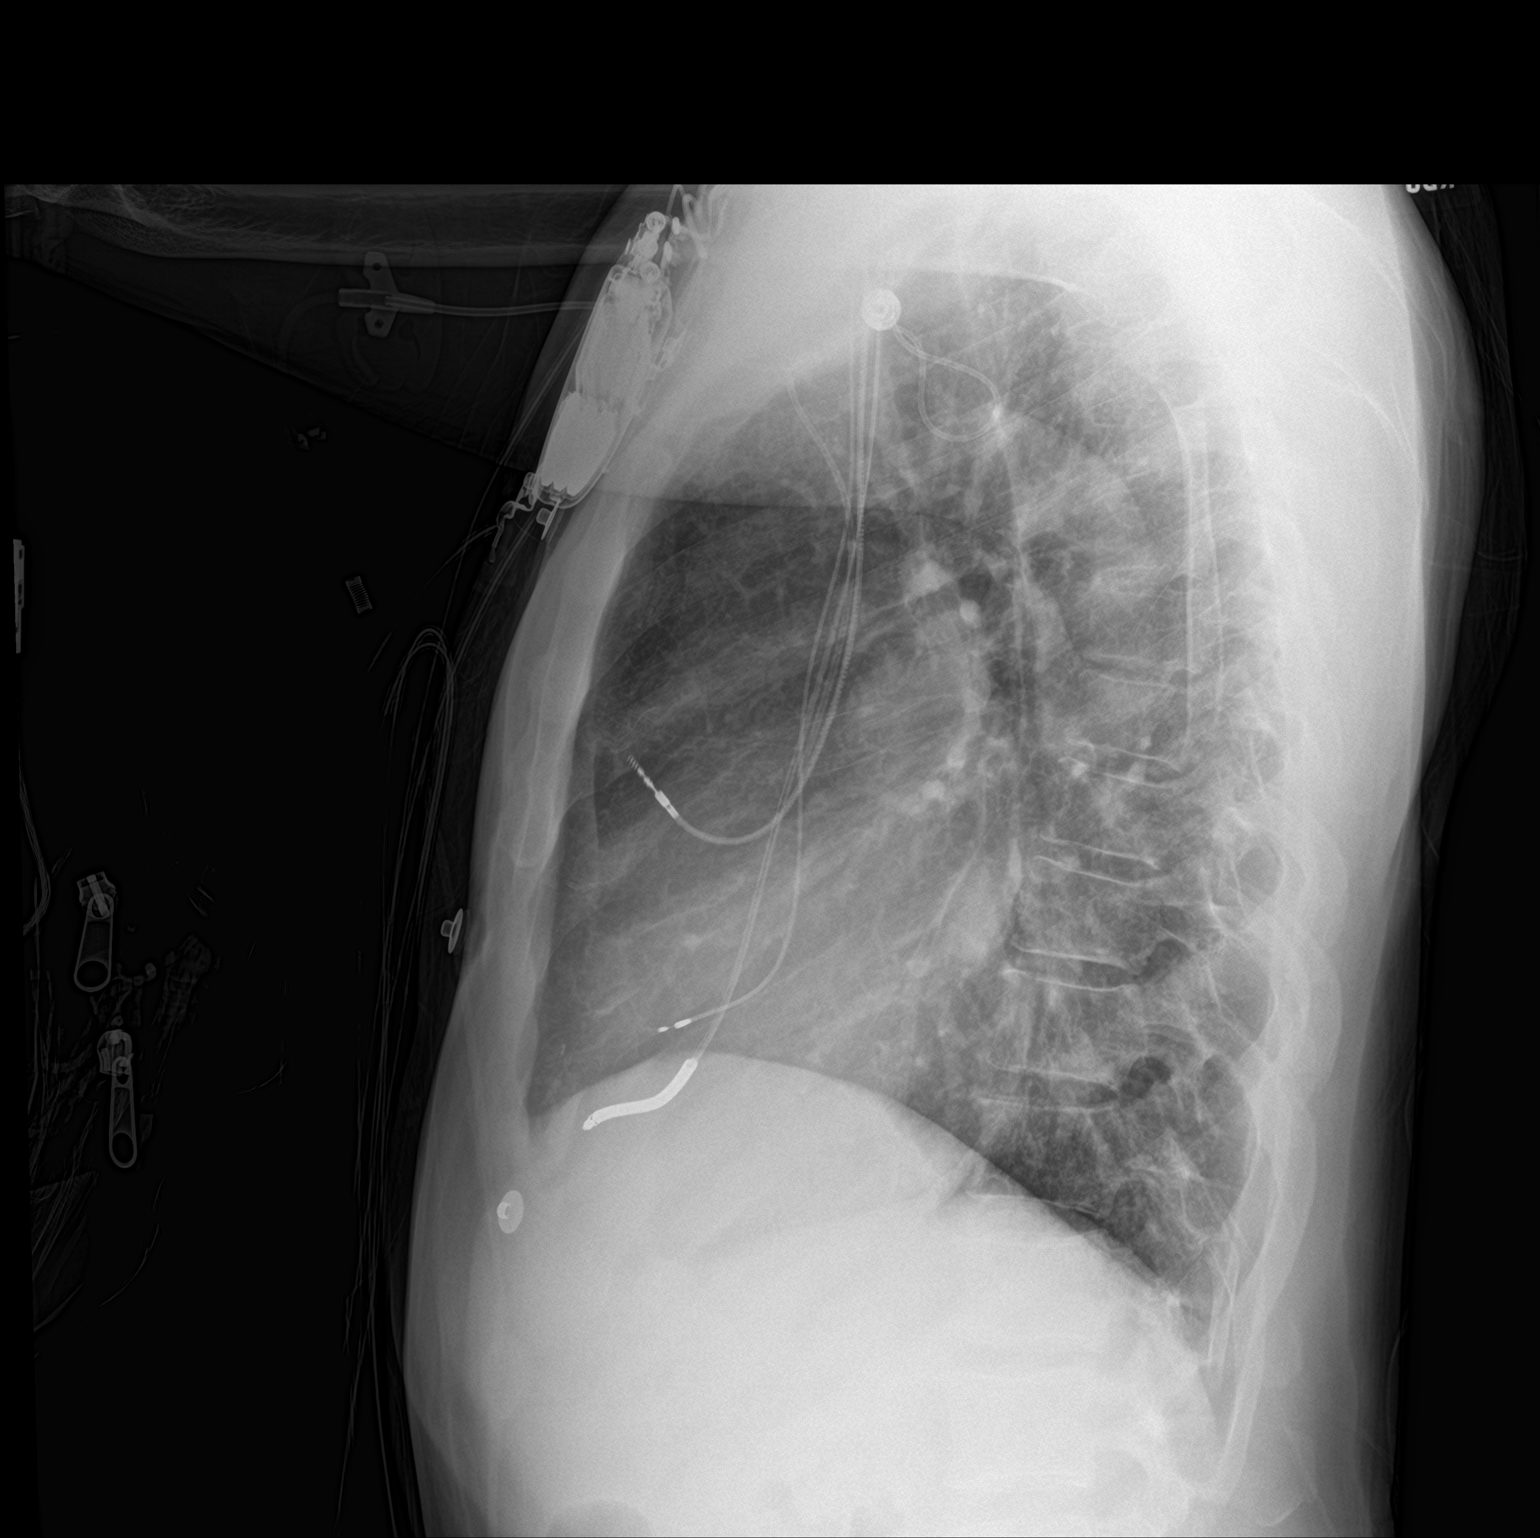

[2 of 2 positions shown; findings below may reference images not displayed]

FINDINGS: Stable cardiomegaly given projection and technique. 3 lead AICD is
stable. Left PICC line tip is stable projecting over lower SVC. No
consolidation, effusion, or pneumothorax. No acute osseous
abnormality is evident.
IMPRESSION: Stable cardiomegaly. No acute pulmonary process identified.

By: Ara Locklear M.D.

## 2019-10-25 IMAGING — CR DG CHEST 1V PORT
1 series · 1 of 1 positions shown · non-contrast
Comparison: 01/18/2018

CLINICAL DATA: Status post LVAD placement

EXAM:
PORTABLE CHEST 1 VIEW

[AP]
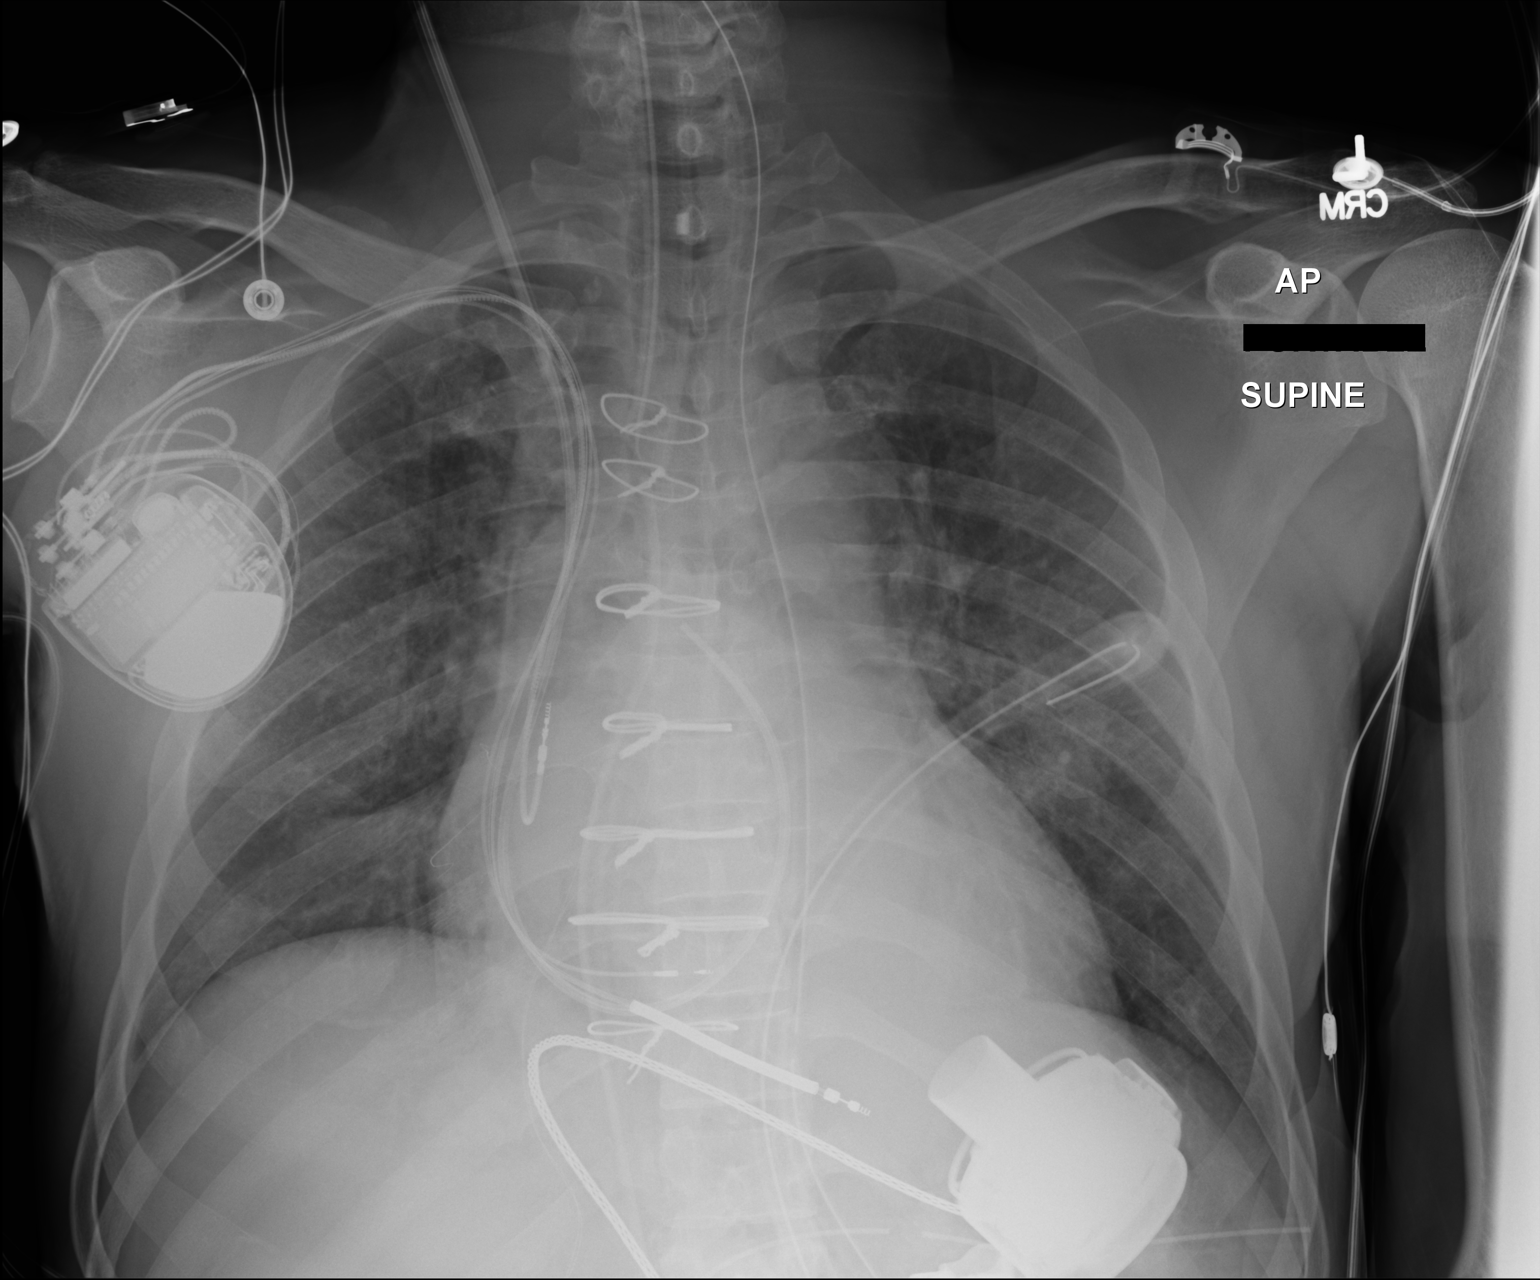

[1 of 1 positions shown; findings below may reference images not displayed]

FINDINGS: Left ventricular assist device is now seen. Defibrillator is again
noted and stable. Swan-Ganz catheter is noted in the pulmonary
outflow tract. Left thoracostomy catheter mediastinal drain are
noted in satisfactory position. Endotracheal tube and nasogastric
catheter are noted in satisfactory position is well. Lungs are well
aerated without pneumothorax. Mild vascular congestion is noted.
Drain catheter is noted over the left lung base.
IMPRESSION: Tubes and lines as described above.

Mild vascular congestion is noted.

## 2019-10-26 IMAGING — DX DG CHEST 1V PORT
1 series · 1 of 1 positions shown · non-contrast
Comparison: January 27, 2018

CLINICAL DATA: ETT.  LVAD placement.

EXAM:
PORTABLE CHEST 1 VIEW

[chest]
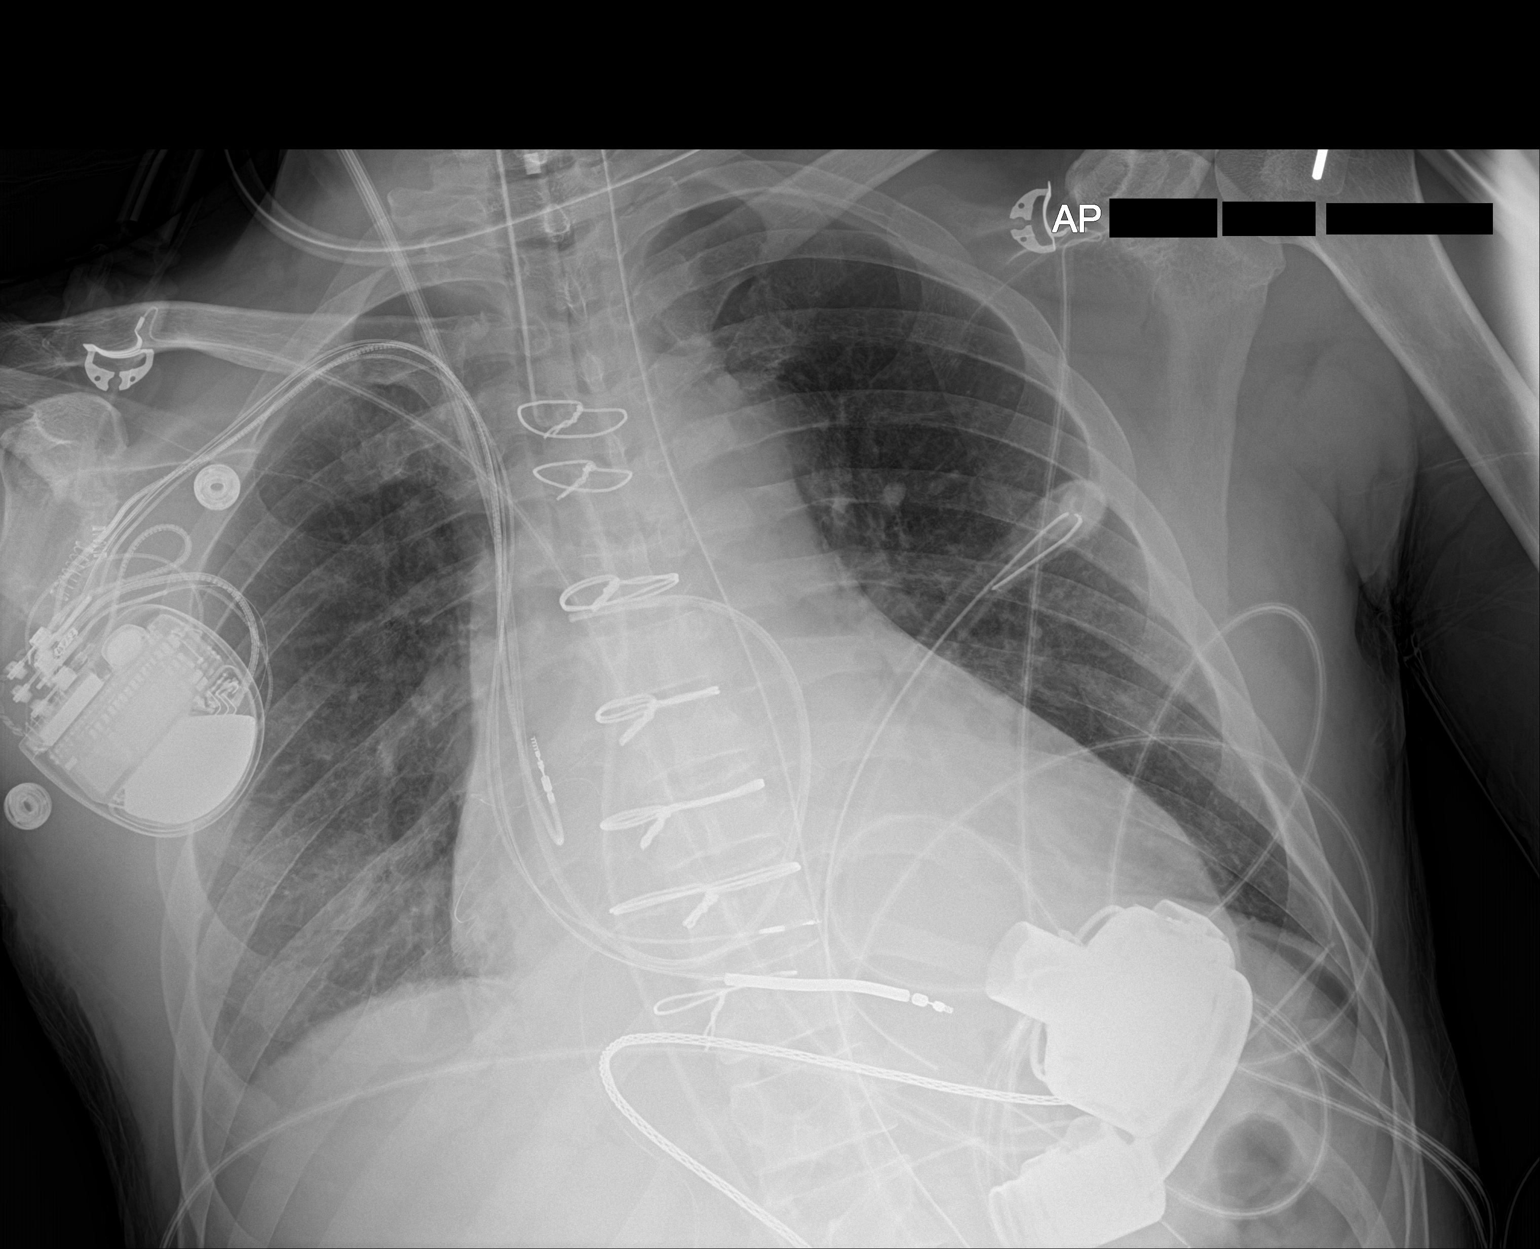

[1 of 1 positions shown; findings below may reference images not displayed]

FINDINGS: The ETT, PA catheter, AICD device, and LVAD are in stable position.
Stable cardiomegaly. No change in the hilar mediastinum. Mild
vascular congestion is mildly improved. No other change.
IMPRESSION: 1. Cardiomegaly and improving pulmonary venous congestion.
2. Stable support apparatus.

## 2019-10-28 IMAGING — DX DG CHEST 1V PORT
1 series · 1 of 1 positions shown · non-contrast
Comparison: 01/29/2018 and earlier.

CLINICAL DATA: 52-year-old male postoperative day 3 LVAD.

EXAM:
PORTABLE CHEST 1 VIEW

[chest]
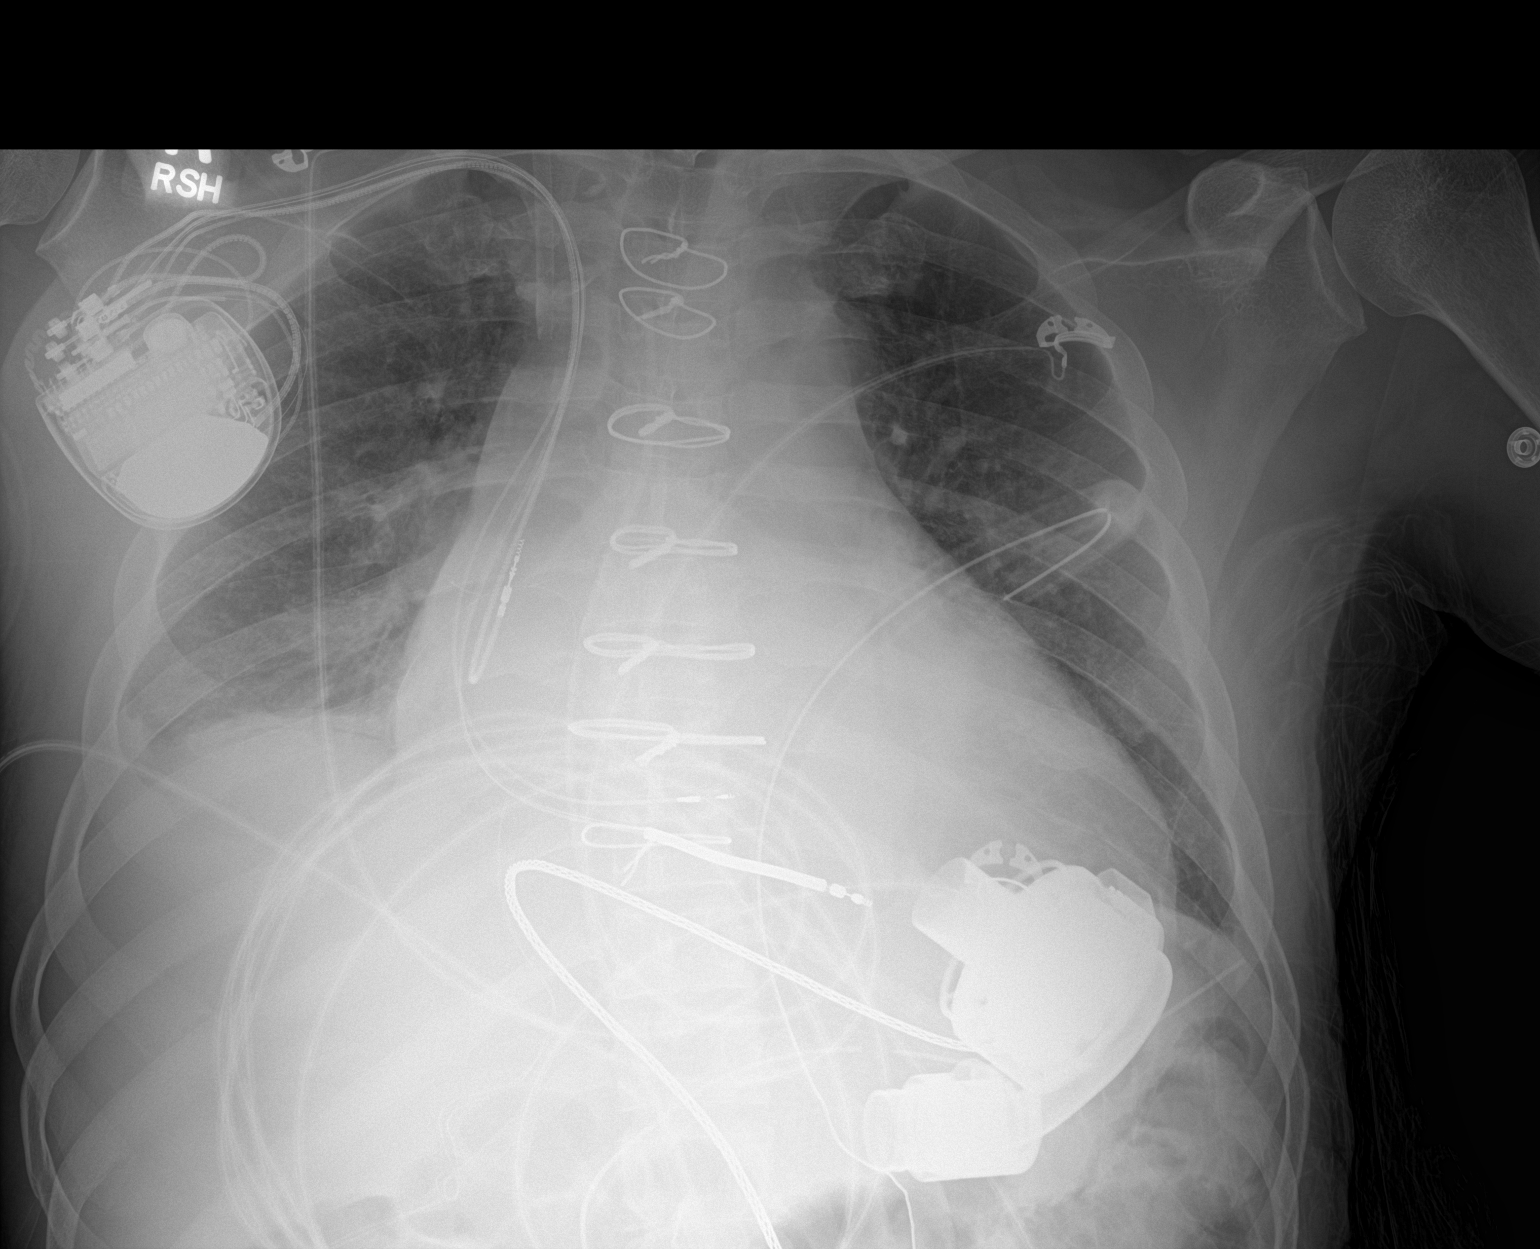

[1 of 1 positions shown; findings below may reference images not displayed]

FINDINGS: Portable AP semi upright view at [id number] hours. Stable left chest tube
and right IJ introducer sheath. Stable cardiac size and mediastinal
contours. LVAD and right side AICD appears stable.

Regressed bilateral pulmonary vascularity. No pneumothorax. No
pleural effusion is evident. Mild retrocardiac atelectasis
suspected.

Negative visible bowel gas pattern. Epicardial pacer wires in place.
IMPRESSION: 1. Stable postoperative changes, lines and tubes.
2. Regressed pulmonary vascularity/edema. No pneumothorax or pleural
effusion. Mild atelectasis.
